# Patient Record
Sex: Male | Born: 1935
Health system: Southern US, Community
[De-identification: ages and names within clinical notes are randomized; demographics above are authoritative.]

## PROBLEM LIST (undated history)

## (undated) DIAGNOSIS — M1990 Unspecified osteoarthritis, unspecified site: Secondary | ICD-10-CM

## (undated) DIAGNOSIS — I2510 Atherosclerotic heart disease of native coronary artery without angina pectoris: Secondary | ICD-10-CM

## (undated) DIAGNOSIS — J45909 Unspecified asthma, uncomplicated: Secondary | ICD-10-CM

## (undated) DIAGNOSIS — C61 Malignant neoplasm of prostate: Secondary | ICD-10-CM

## (undated) DIAGNOSIS — I10 Essential (primary) hypertension: Secondary | ICD-10-CM

## (undated) HISTORY — PX: CORONARY ANGIOPLASTY WITH STENT PLACEMENT: SHX49

## (undated) HISTORY — PX: OTHER SURGICAL HISTORY: SHX169

## (undated) HISTORY — DX: Unspecified osteoarthritis, unspecified site: M19.90

## (undated) HISTORY — DX: Malignant neoplasm of prostate: C61

---

## 2004-11-01 HISTORY — PX: KNEE SURGERY: SHX244

## 2005-03-06 ENCOUNTER — Encounter: Admission: RE | Admit: 2005-03-06 | Discharge: 2005-03-06 | Payer: Self-pay | Admitting: Orthopaedic Surgery

## 2010-10-07 ENCOUNTER — Ambulatory Visit: Payer: Self-pay | Admitting: Internal Medicine

## 2012-11-05 ENCOUNTER — Inpatient Hospital Stay: Payer: Self-pay | Admitting: Student

## 2012-11-05 DIAGNOSIS — I059 Rheumatic mitral valve disease, unspecified: Secondary | ICD-10-CM

## 2012-11-05 LAB — CBC WITH DIFFERENTIAL/PLATELET
Basophil #: 0.2 10*3/uL — ABNORMAL HIGH (ref 0.0–0.1)
Basophil #: 0 10*3/uL (ref 0.0–0.1)
Basophil %: 1.6 %
Basophil %: 1.1 %
Eosinophil #: 0.4 10*3/uL (ref 0.0–0.7)
Eosinophil #: 0 10*3/uL (ref 0.0–0.7)
Eosinophil %: 4.1 %
HCT: 34.8 % — ABNORMAL LOW (ref 40.0–52.0)
HGB: 9.8 g/dL — ABNORMAL LOW (ref 13.0–18.0)
Lymphocyte #: 4.5 10*3/uL — ABNORMAL HIGH (ref 1.0–3.6)
MCHC: 30.3 g/dL — ABNORMAL LOW (ref 32.0–36.0)
MCHC: 31.3 g/dL — ABNORMAL LOW (ref 32.0–36.0)
MCV: 78 fL — ABNORMAL LOW (ref 80–100)
Monocyte #: 0 x10 3/mm — ABNORMAL LOW (ref 0.2–1.0)
Monocyte %: 7.1 %
Monocyte %: 0.7 %
Neutrophil #: 4.4 10*3/uL (ref 1.4–6.5)
Neutrophil #: 4 10*3/uL (ref 1.4–6.5)
Neutrophil %: 43.1 %
Platelet: 296 10*3/uL (ref 150–440)
RDW: 17.3 % — ABNORMAL HIGH (ref 11.5–14.5)
WBC: 10.3 10*3/uL (ref 3.8–10.6)

## 2012-11-05 LAB — COMPREHENSIVE METABOLIC PANEL
BUN: 28 mg/dL — ABNORMAL HIGH (ref 7–18)
Co2: 25 mmol/L (ref 21–32)
Glucose: 150 mg/dL — ABNORMAL HIGH (ref 65–99)
SGPT (ALT): 12 U/L (ref 12–78)
Total Protein: 7.9 g/dL (ref 6.4–8.2)

## 2012-11-05 LAB — BASIC METABOLIC PANEL
EGFR (African American): >60
EGFR (Non-African Amer.): 56 — ABNORMAL LOW
Potassium: 3.4 mmol/L — ABNORMAL LOW (ref 3.5–5.1)

## 2012-11-05 LAB — CK TOTAL AND CKMB (NOT AT ARMC)
CK, Total: 283 U/L — ABNORMAL HIGH (ref 35–232)
CK-MB: 26.3 ng/mL — ABNORMAL HIGH (ref 0.5–3.6)

## 2012-11-05 LAB — PRO B NATRIURETIC PEPTIDE: B-Type Natriuretic Peptide: 3513 pg/mL — ABNORMAL HIGH (ref 0–450)

## 2012-11-05 LAB — TROPONIN I
Troponin-I: 0.09 ng/mL — ABNORMAL HIGH
Troponin-I: 11 ng/mL — ABNORMAL HIGH

## 2012-11-05 LAB — APTT
Activated PTT: 33.7 secs (ref 23.6–35.9)
Activated PTT: 66 secs — ABNORMAL HIGH (ref 23.6–35.9)

## 2012-11-05 LAB — CK-MB: CK-MB: 1.8 ng/mL (ref 0.5–3.6)

## 2012-11-05 LAB — CK: CK, Total: 70 U/L (ref 35–232)

## 2012-11-06 LAB — BASIC METABOLIC PANEL
Anion Gap: 11 (ref 7–16)
BUN: 40 mg/dL — ABNORMAL HIGH (ref 7–18)
Calcium, Total: 8.7 mg/dL (ref 8.5–10.1)
EGFR (Non-African Amer.): 55 — ABNORMAL LOW
Glucose: 97 mg/dL (ref 65–99)

## 2012-11-06 LAB — CBC WITH DIFFERENTIAL/PLATELET
Basophil #: 0 10*3/uL (ref 0.0–0.1)
Eosinophil #: 0 10*3/uL (ref 0.0–0.7)
HGB: 9.6 g/dL — ABNORMAL LOW (ref 13.0–18.0)
Lymphocyte #: 0.9 10*3/uL — ABNORMAL LOW (ref 1.0–3.6)
MCHC: 32.1 g/dL (ref 32.0–36.0)
MCV: 78 fL — ABNORMAL LOW (ref 80–100)
Monocyte #: 0.8 x10 3/mm (ref 0.2–1.0)
Neutrophil %: 81.4 %
Platelet: 278 10*3/uL (ref 150–440)
RBC: 3.83 10*6/uL — ABNORMAL LOW (ref 4.40–5.90)
RDW: 16.6 % — ABNORMAL HIGH (ref 11.5–14.5)

## 2012-11-06 LAB — LIPID PANEL
Ldl Cholesterol, Calc: 134 mg/dL — ABNORMAL HIGH (ref 0–100)
Triglycerides: 62 mg/dL (ref 0–200)
VLDL Cholesterol, Calc: 12 mg/dL (ref 5–40)

## 2012-11-07 LAB — BASIC METABOLIC PANEL
Chloride: 101 mmol/L (ref 98–107)
Co2: 30 mmol/L (ref 21–32)
EGFR (African American): >60
EGFR (Non-African Amer.): >60
Glucose: 82 mg/dL (ref 65–99)
Osmolality: 282 (ref 275–301)
Potassium: 3.4 mmol/L — ABNORMAL LOW (ref 3.5–5.1)

## 2012-11-07 LAB — APTT: Activated PTT: 42.9 secs — ABNORMAL HIGH (ref 23.6–35.9)

## 2012-11-08 LAB — BASIC METABOLIC PANEL
Anion Gap: 8 (ref 7–16)
Calcium, Total: 8.7 mg/dL (ref 8.5–10.1)
Chloride: 104 mmol/L (ref 98–107)
Co2: 27 mmol/L (ref 21–32)
Creatinine: 0.94 mg/dL (ref 0.60–1.30)
EGFR (Non-African Amer.): >60
Sodium: 139 mmol/L (ref 136–145)

## 2012-11-08 LAB — HEMOGLOBIN: HGB: 9.8 g/dL — ABNORMAL LOW (ref 13.0–18.0)

## 2012-11-08 LAB — PLATELET COUNT: Platelet: 286 10*3/uL (ref 150–440)

## 2014-06-24 DIAGNOSIS — I129 Hypertensive chronic kidney disease with stage 1 through stage 4 chronic kidney disease, or unspecified chronic kidney disease: Secondary | ICD-10-CM | POA: Diagnosis present

## 2014-06-24 DIAGNOSIS — I2510 Atherosclerotic heart disease of native coronary artery without angina pectoris: Secondary | ICD-10-CM | POA: Diagnosis present

## 2014-06-24 DIAGNOSIS — J45909 Unspecified asthma, uncomplicated: Secondary | ICD-10-CM | POA: Insufficient documentation

## 2015-02-21 NOTE — Consult Note (Signed)
PATIENT NAME:  Johnathan Gould, Johnathan Gould MR#:  [id number] DATE OF BIRTH:  1935/08/09  DATE OF CONSULTATION:  11/05/2012  REFERRING PHYSICIAN:   CONSULTING PHYSICIAN:  [name], MD  HISTORY OF PRESENT ILLNESS:  The patient was admitted into the hospital room with congestive heart failure, uncontrolled hypertension and flash pulmonary edema. He complained of some shortness of breath on exertion, but denies any history of chest pain. Chest x-ray revealed congestive heart failure.  The patient was hypoxic, responded well to CPAP and inhaled bronchodilator treatment. The patient does not give any previous history of congestive heart failure, but he has a 45-year history of smoking one half to 1 pack per day.  He does not drink any alcohol.  He used to drink when he was young.  He has not been seen in my office in the last three years.  On pH was 7.22, pCO2 of 65.  Blood pressure was 217/127.  ALLERGIES:  Not known.  SOCIAL HISTORY: The patient is a smoker, 1/2 pack per day.   REVIEW OF SYSTEMS:  As per history and physical.  PHYSICAL EXAMINATION: GENERAL: The patient is an alert, cooperative male.   VITAL SIGNS:  Temperature 98.6, pulse rate 100, blood pressure and 136/79, oximetry 96. HEENT: Head normocephalic. Pupils reactive. Sclerae anicteric. Tongue is moist, papillated.  NECK: Supple. The patient has a right carotid bruit, no goiter. No lymphadenopathy.  CARDIOVASCULAR: Apical impulse is not palpable. Both heart sounds are normal. There is a grade II systolic murmur noted.  CHEST: Decreased breath sounds.  ABDOMEN: Soft, nontender, no hepatosplenomegaly.  EXTREMITIES:  There is no pedal edema.   LABORATORY AND DIAGNOSTIC DATA:  CPK 283 with a CK-MB of 26.3. Troponin 11.0. D-dimer 0.98.  Glucose 142, creatinine 1.25, potassium 3.4. Calcium 8.6. GFR more than 60. Repeat blood gases revealed pH 7.35, pCO2 52, pO2  359. Initial blood gas pH was 7.22, pCO2 of 65 and pO2 of 66. Chest x-ray revealed  congestive heart failure.   IMPRESSION: 1.  Acute myocardial infarction, non-ST elevated. 2.  Congestive heart failure with pulmonary edema.  3.  Hypertension with hypertensive cardiovascular disease. 4.  Chronic obstructive pulmonary disease secondary to heavy smoking. 5.  Right carotid bruit.   RECOMMENDATIONS:  I recommended the patient should undergo cardiac catheterization. Will call Dr. Ubaldo Glassing and see if we can put him on the schedule for tomorrow.     ____________________________ [name], MD jm:ct D: 11/05/2012 12:00:50 ET T: 11/05/2012 12:43:53 ET JOB#: [id number]  cc: [name], MD, <Dictator> [name] MD ELECTRONICALLY SIGNED 11/19/2012 11:38

## 2015-02-21 NOTE — H&P (Signed)
PATIENT NAME:  Johnathan Gould, Johnathan Gould MR#:  [id number] DATE OF BIRTH:  [birth year]  DATE OF ADMISSION:  11/05/2012  PRIMARY CARE PHYSICIAN:  Dr. Lavera Guise.  CODE STATUS:  DO NOT RESUSCITATE.   SURROGATE DECISION-MAKER:  His wife, Karthikeya Funke.   CHIEF COMPLAINT:  Shortness of breath.   HISTORY OF PRESENT ILLNESS:  The patient is a 80 year old male with a prior history of hypertension, not on current medications, tobacco abuse without known diagnosis of COPD, history of angina per the wife which is chronic, presents with sudden onset shortness of breath.  The patient was in his usual state of health when he suddenly became short of breath.  EMS was alerted and he was noted to be hypoxic 80% on room air.  Nonrebreather and DuoNebs were attempted via EMS, however he was anxious so started on CPAP by EMS.  The patient has not been evaluated by a doctor in the last 3 to 4 years.  Most of the history is obtained from the wife who is at the bedside.  The patient corroborates, however he is quite stoic.  During this event, the patient was sweating.  He has had chest pain, apparently long-standing at rest and with activity.  Wife tells me that patient has dyspnea on exertion at least for the last 1 year, however when I ask the patient, he says he does not make a habit of walking.  Wife noticed that he has had some bilateral swelling in his legs just extending superior to his ankles.  He denies orthopnea, although he does sleep sitting up, but that is chronic for him.  During the acute episode his heart was racing and he was quite anxious.   Upon evaluation in the Emergency Department, patient was in significant respiratory distress, was wheezing diffusely, retracting, using accessory muscles and he had prolonged expirations and he was tachycardic.  He was started on BiPAP.  Initial ABG showed hypercarbic respiratory failure with a pH of 7.22 and a pCO2 of 65.  Also, upon arrival his blood pressure was noted to be  significantly elevated at 217/127.  He has since received DuoNebs x3, albuterol, Solu-Medrol, magnesium sulfate. Initial chest x-ray also showed pulmonary edema so he has been started on a nitroglycerin paste and he has received 40 mg of Lasix.  The patient has had a productive cough, however, he is unable to tell me the duration of this cough.    PAST MEDICAL HISTORY:   1.  Prior history of hypertension, however he has been off meds for many years.   2.  Apparently history of angina.  Wife tells me that he had a negative stress test 3 years ago, has not followed up with a physician since then.  3.  He smokes tobacco.   PAST SURGICAL HISTORY:  None.   MEDICATIONS:  None.  ALLERGIES:  None.   SOCIAL HISTORY:  Smokes, although he is unable to quantify how much.  Denies alcohol or illicit drugs.  He is married.   FAMILY HISTORY:  Noncontributory.   REVIEW OF SYSTEMS:   CONSTITUTIONAL:  Denies fevers, fatigue.  Admits to intentional 40 pound weight loss over the last one year.  He changed his diet.  EYES:  No eye pain.  No double vision.    EARS, NOSE, THROAT:  Denies tinnitus, ear pain, epistaxis.  Patient is very hard of hearing. RESPIRATORY:  Admits to productive cough, shortness of breath, denies painful respirations.  CARDIOVASCULAR:  Admits to chronic chest pain,  some ankle edema.  Denies orthopnea.  Denies palpitations.  GASTROINTESTINAL:  Denies nausea, vomiting, diarrhea, abdominal pain, hematochezia or melena.  GENITOURINARY:  Denies frequency, urgency or dysuria.  ENDOCRINE:  Denies increased thirst or night sweats.  INTEGUMENTARY:  Denies any new skin rashes.  MUSCULOSKELETAL:  Denies arthritis, joint aches or joint swelling.  NEUROLOGIC:  Denies numbness, dysarthria, tremor, focal weakness.  PSYCHIATRIC:  Family reports insomnia, that is chronic for him.  No history of depression or anxiety.   PHYSICAL EXAMINATION:  VITAL SIGNS:  On admission, pulse 133, respirations 46,  blood pressure 217/127.  Currently his vital signs are temp 97.8, heart rate 99, respirations 20 with a blood pressure 137/76, saturating 94% on 2 liters.  GENERAL:  Caucasian male in no apparent distress currently.  His symptoms have improved after ED initial therapy.  EYES:  Pupils are equally round and reactive to light and accommodation.  Funduscopic exam attempted, but unsuccessful.  Anicteric sclerae.  EARS, NOSE, THROAT:  Normal external ears and nares.  Unable to appreciate posterior oropharynx.  CARDIOVASCULAR:  S1, S2, regular rate and rhythm.  No pretibial edema.  PULMONARY:  Clear to auscultation bilaterally at this point.  Normal effort now.  ABDOMEN:  Soft, nontender, nondistended.  No hepatomegaly.  SKIN:  Warm and dry.  No rashes.  MUSCULOSKELETAL:  Full range of motion of bilateral upper and lower extremities.  No clubbing or cyanosis noted.  PSYCHIATRIC:  The patient is awake, alert and oriented.  Judgment appears intact.   LABORATORY DATA:  White count 10.3, hemoglobin 10.5, hematocrit 34.8, platelets 371 with an MCV of 80.   Glucose 150, BUN 28, creatinine 1.18, sodium 143, potassium 3.7, chloride 109, bicarb 25, calcium 8.5, bilirubin 0.2, alk phos is 126, ALT 12, AST 20, total protein 7.9, albumin 3.6, osmolality 293, anion gap of 9.   BNP 3513.  Troponin 0.09.  CK 70.  CK-MB 1.8.  D-dimer is elevated to 0.98.  PH 7.22, pCO2 65, pO2 66, FiO2 100, bicarb 26.6 on BiPAP.  Followup ABG an hour later shows pH 7.35, pCO2 52, pO2 359, FiO2 100%, bicarb 28.7 on BiPAP.    Chest x-ray shows pulmonary edema.  Final x-ray pending.  Initial EKG shows sinus tachycardia at 121 beats per minute with possible lateral infarct.  Repeat EKG 3 hours later shows normal sinus rhythm at 98 beats per minute with incomplete right bundle branch block.  The patient also has voltage criteria for LVH.  There is probable P pulmonale.  There is very mild ST segment depression, between 0.5 to 1 mm in V4, V5  and V6.  No T wave inversions.   ASSESSMENT AND PLAN:  The patient is a 80 year old male presenting with acute respiratory failure, found to be hypercarbic, with pulmonary edema and significantly hypertensive. 1.  Acute respiratory failure, hypercarbic and hypoxic.  Etiology of this is multifactorial at this time, most likely due to underlying chronic obstructive pulmonary disease exacerbation as well as acute pulmonary edema.  He has improved on BiPAP with improvement of his pH to 7.35 and a pCO2 of 52.  We will continue BiPAP.  We will continue Nitro-Bid to improve the pulmonary edema and continue nebs.  We will wean him to nasal cannula and off oxygen as tolerated. 2.  Acute pulmonary edema, etiology unclear, malignant hypertension versus new onset congestive heart failure versus non-ST-elevation myocardial infarction.  He has received Lasix in the Emergency Department.  We will continue Lasix 40 mg q.  12 for the next 4 doses and then further diuresis will be dependent on his exam.  Given the history of "long-standing angina and dyspnea on exertion" I am concerned for ischemic causes and thus we will check an echocardiogram.  We will cycle his cardiac enzymes and we will consult cardiology regarding need for inpatient stress test.  He apparently had a reportedly negative stress test 3 years ago.  I do not know the physician that he saw.  A D-dimer is slightly elevated and, given his presentation, I will rule out an acute pulmonary embolus with a CTPA.  3.  Chronic obstructive pulmonary disease exacerbation.  Given his long-standing tobacco use, he probably has underlying chronic obstructive pulmonary disease and the acute hypercarbic respiratory failure is most likely a result of chronic obstructive pulmonary disease exacerbation.  He is improved with DuoNeb, Solu-Medrol as well as BiPAP.  We will continue DuoNebs every 4 hours, BiPAP, Solu-Medrol 40 mg daily.  He will benefit from outpatient pulmonary  function testing and, if noted to be diagnosed with chronic obstructive pulmonary disease, started on appropriate medications.  4.  Malignant hypertension.  Currently blood pressure is well-controlled.  The patient had a diagnosis of hypertension, however has stopped medications.  At this point, we will start hydrochlorothiazide and lisinopril orally and continue Lasix for diuresis for the pulmonary edema as well as Nitro-Bid for the pulmonary edema.  5.  Tobacco abuse.  Cessation counseling was provided.  The patient is not ready to quit.  6.  Prophylaxis.  Lovenox.  7.  CODE STATUS:  He is a DO NOT RESUSCITATE.  This was discussed with himself and his wife as well as his sons, who are all aware of his wishes.  His surrogate decision-maker is his wife, Stelios Kirby.   DISPOSITION:  The patient is being admitted inpatient for management of acute respiratory failure as well as malignant hypertension.  I anticipate he will require 2 hospital midnight stays for evaluation and management.   TIME SPENT COORDINATING ADMISSION:  Fifty minutes.     ____________________________ Samson Frederic, DO aeo:ea D: 11/05/2012 03:13:31 ET T: 11/05/2012 06:26:07 ET JOB#: [id number]  cc: Samson Frederic, DO, <Dictator> Samson Frederic, DO Dr. Haydee Salter E Favour Aleshire DO ELECTRONICALLY SIGNED 12/26/2012 3:43

## 2015-02-21 NOTE — Discharge Summary (Signed)
PATIENT NAME:  Johnathan Gould, Johnathan Gould MR#:  [id number] DATE OF BIRTH:  01-Dec-1934  DATE OF ADMISSION:  11/05/2012 DATE OF DISCHARGE:  11/08/2012  CONSULTANTS:  [name], MD, cardiology, and Javier Docker. Ubaldo Glassing, MD, from cardiology.   CHIEF COMPLAINT:  Shortness of breath.   DISCHARGE DIAGNOSES:   1.  Acute respiratory failure from chronic obstructive pulmonary disease flare likely and acute systolic congestive heart failure.  2.  Non-ST elevation myocardial infarction.  3.  Malignant hypertension.  4.  Flash pulmonary edema.  5.  History of hypertension, not on any medications.  6.  History of angina.  7.  Tobacco abuse.   DISCHARGE MEDICATIONS:   1.  Prednisone taper 40 mg for 1 day, then taper by 10 mg daily until done in 4 days.  2.  Lisinopril 5 mg daily.  3.  Nitroglycerin 0.4 mg sublingual 1 tab every 5 minutes p.r.n. x 3 for chest pain or angina.  4.  Rosuvastatin 10 mg bedtime.  5.  Plavix 75 mg daily.  6.  Metoprolol tartrate 25 mg 2 times a day.  7.  Albuterol/ipratropium 2 puffs 4 times a day for shortness of breath as needed.  8.  Hydrochlorothiazide 25 mg daily.  9.  Tiotropium 18 mcg inhaled 1 cap once a day.   DISPOSITION:  Home.   DIET:  Low sodium, low fat, low cholesterol.   ACTIVITY: As tolerated.   FOLLOWUP:  Please follow with your primary care physician within 1 to 2 weeks. Please follow with Dr. Ubaldo Glassing within a week. Please follow with a pulmonologist for scheduling for pulmonary function tests within 1 to 2 weeks.   HISTORY OF PRESENT ILLNESS:  For full details of the H and P, please see the dictation by Dr. Levonne Hubert on January 5th, but briefly, this is a pleasant 80 year old male on no medications with tobacco abuse ongoing without significant history of COPD, but has angina who presented with shortness of breath. He was noted to be hypoxic at 80% on room air on arrival. He was given DuoNebs and started on oxygen and CPAP and admitted to the hospitalist service.  ABGs did show hypercapnic respiratory failure with pH of 7.22 and pCO2 of 65. He also did receive a dose of Lasix.   SIGNIFICANT LABS AND IMAGING:  Initial BNP was 3513. BUN was 28, creatinine 1.18, chloride 108, potassium 3.7. LFTs were within normal limits. Initial troponin was 0.09, then 11 and then 7.48. Initial CK-MB was 1.8, then 26.3, then 12.7. The last CK-MB was 1.5 as of yesterday. Initial WBC 10.3, hemoglobin 10.5. D-dimer was 0.98. Cardiac catheterization on January 6th showed 70% stenosis in proximal LAD, second diagonal, 100% stenosis proximal circumflex, small size 100% stenosis, mid RCA 99% stenosis and in a second lesion there was a 70% stenosis. EF was 40%. A repeat cardiac catheterization on December 7th during which time the patient received a drug-eluting stent to the RCA lesion. The result of this is not _____. Echocardiogram showing EF of 35 to 40%, moderate concentric left ventricular hypertrophy. There is moderate lateral wall hypokinesis, severe inferior wall hypokinesis and moderate mitral regurgitation. Initial x-ray of the chest showing interstitial infiltrate likely representing pulmonary edema.   HOSPITAL COURSE:  The patient was admitted to the hospitalist service for presumed respiratory failure, likely COPD, with flash edema. The patient was started on steroids and nebulizers. The patient also did have a bump in his troponin, likely a non-ST elevation MI. The troponin did trend to  11 and therefore the patient was started on a heparin drip, beta-blocker and cardiology was consulted. The patient did present with malignant hypertension on arrival with significant elevation of blood pressure as per H and P.   In regards to the acute respiratory failure, he improved dramatically. He does have tobacco abuse; however, he is not on any medications including inhalers. He was started on steroids, nebulizers, oxygen and slowly weaned off. Currently, he is on room air and ambulating  without significant shortness of breath. Furthermore, the patient did have flash pulmonary edema which did resolve with Lasix. This was likely in the setting of acute systolic CHF as the echocardiogram did show EF down at about 35 to 40%. Furthermore, pulmonary edema was worsened by malignant hypertension which likely led to flash edema in addition to acute CHF. He has clinically improved and currently, there is no significant volume overload. He was started on several medications including beta-blocker, ACE inhibitor and aspirin.   The patient did bump his troponin to 11 with elevation of CK-MB fraction as well and therefore started on heparin drip and cardiology consult. The patient did have multivessel disease and per cardiology, he did not want CABG. Therefore, he underwent a repeat cardiac catheterization and a drug-eluting stent was placed on the RCA lesion which was thought to be the _____ lesion at this point. He will follow with cardiology post discharge for further evaluation and management and consideration for possible CABG. He will be discharged on statin and Plavix in addition to the above-said medications. His blood pressure has been controlled and at this point after a day in the CCU post stent placement, he is chest pain free, ambulating and per cardiology is okay to be discharged and therefore, he will be discharged with outpatient followup.   TOTAL TIME SPENT:  35 minutes.   CODE STATUS:  The patient is DO NOT RESUSCITATE.    ____________________________ Vivien Presto, MD sa:si D: 11/08/2012 16:29:16 ET T: 11/08/2012 20:29:18 ET JOB#: [id number]  cc: Vivien Presto, MD, <Dictator> Javier Docker. Ubaldo Glassing, MD Vivien Presto MD ELECTRONICALLY SIGNED 11/18/2012 17:24

## 2016-10-16 ENCOUNTER — Inpatient Hospital Stay
Admission: EM | Admit: 2016-10-16 | Discharge: 2016-10-23 | DRG: 686 | Disposition: A | Discharge status: Home / Self Care | Payer: Medicare Other | Attending: Internal Medicine | Admitting: Internal Medicine

## 2016-10-16 ENCOUNTER — Encounter: Payer: Self-pay | Admitting: Emergency Medicine

## 2016-10-16 ENCOUNTER — Emergency Department: Payer: Medicare Other

## 2016-10-16 DIAGNOSIS — C61 Malignant neoplasm of prostate: Secondary | ICD-10-CM | POA: Diagnosis present

## 2016-10-16 DIAGNOSIS — N138 Other obstructive and reflux uropathy: Secondary | ICD-10-CM | POA: Diagnosis present

## 2016-10-16 DIAGNOSIS — M6281 Muscle weakness (generalized): Secondary | ICD-10-CM

## 2016-10-16 DIAGNOSIS — I10 Essential (primary) hypertension: Secondary | ICD-10-CM | POA: Diagnosis present

## 2016-10-16 DIAGNOSIS — I129 Hypertensive chronic kidney disease with stage 1 through stage 4 chronic kidney disease, or unspecified chronic kidney disease: Secondary | ICD-10-CM | POA: Diagnosis present

## 2016-10-16 DIAGNOSIS — E875 Hyperkalemia: Secondary | ICD-10-CM | POA: Diagnosis present

## 2016-10-16 DIAGNOSIS — Z66 Do not resuscitate: Secondary | ICD-10-CM | POA: Diagnosis present

## 2016-10-16 DIAGNOSIS — C7911 Secondary malignant neoplasm of bladder: Secondary | ICD-10-CM | POA: Diagnosis present

## 2016-10-16 DIAGNOSIS — Z8249 Family history of ischemic heart disease and other diseases of the circulatory system: Secondary | ICD-10-CM

## 2016-10-16 DIAGNOSIS — C7951 Secondary malignant neoplasm of bone: Secondary | ICD-10-CM | POA: Diagnosis present

## 2016-10-16 DIAGNOSIS — E872 Acidosis: Secondary | ICD-10-CM | POA: Diagnosis present

## 2016-10-16 DIAGNOSIS — M25552 Pain in left hip: Secondary | ICD-10-CM

## 2016-10-16 DIAGNOSIS — I2510 Atherosclerotic heart disease of native coronary artery without angina pectoris: Secondary | ICD-10-CM | POA: Diagnosis present

## 2016-10-16 DIAGNOSIS — T50995A Adverse effect of other drugs, medicaments and biological substances, initial encounter: Secondary | ICD-10-CM | POA: Diagnosis not present

## 2016-10-16 DIAGNOSIS — I252 Old myocardial infarction: Secondary | ICD-10-CM

## 2016-10-16 DIAGNOSIS — D638 Anemia in other chronic diseases classified elsewhere: Secondary | ICD-10-CM | POA: Diagnosis present

## 2016-10-16 DIAGNOSIS — M549 Dorsalgia, unspecified: Secondary | ICD-10-CM | POA: Diagnosis present

## 2016-10-16 DIAGNOSIS — R2681 Unsteadiness on feet: Secondary | ICD-10-CM

## 2016-10-16 DIAGNOSIS — N1330 Unspecified hydronephrosis: Secondary | ICD-10-CM

## 2016-10-16 DIAGNOSIS — N179 Acute kidney failure, unspecified: Secondary | ICD-10-CM | POA: Diagnosis present

## 2016-10-16 DIAGNOSIS — A419 Sepsis, unspecified organism: Secondary | ICD-10-CM | POA: Diagnosis present

## 2016-10-16 DIAGNOSIS — N131 Hydronephrosis with ureteral stricture, not elsewhere classified: Secondary | ICD-10-CM | POA: Diagnosis present

## 2016-10-16 DIAGNOSIS — N400 Enlarged prostate without lower urinary tract symptoms: Secondary | ICD-10-CM | POA: Diagnosis present

## 2016-10-16 DIAGNOSIS — J45909 Unspecified asthma, uncomplicated: Secondary | ICD-10-CM | POA: Diagnosis present

## 2016-10-16 DIAGNOSIS — N139 Obstructive and reflux uropathy, unspecified: Secondary | ICD-10-CM | POA: Diagnosis present

## 2016-10-16 DIAGNOSIS — D494 Neoplasm of unspecified behavior of bladder: Secondary | ICD-10-CM | POA: Diagnosis not present

## 2016-10-16 DIAGNOSIS — R590 Localized enlarged lymph nodes: Secondary | ICD-10-CM | POA: Diagnosis present

## 2016-10-16 DIAGNOSIS — Z79899 Other long term (current) drug therapy: Secondary | ICD-10-CM | POA: Diagnosis not present

## 2016-10-16 DIAGNOSIS — Z7982 Long term (current) use of aspirin: Secondary | ICD-10-CM

## 2016-10-16 DIAGNOSIS — K5903 Drug induced constipation: Secondary | ICD-10-CM | POA: Diagnosis not present

## 2016-10-16 DIAGNOSIS — R310 Gross hematuria: Secondary | ICD-10-CM | POA: Diagnosis present

## 2016-10-16 DIAGNOSIS — Z955 Presence of coronary angioplasty implant and graft: Secondary | ICD-10-CM | POA: Diagnosis not present

## 2016-10-16 DIAGNOSIS — N183 Chronic kidney disease, stage 3 unspecified: Secondary | ICD-10-CM | POA: Diagnosis present

## 2016-10-16 DIAGNOSIS — R262 Difficulty in walking, not elsewhere classified: Secondary | ICD-10-CM

## 2016-10-16 DIAGNOSIS — N132 Hydronephrosis with renal and ureteral calculous obstruction: Secondary | ICD-10-CM | POA: Diagnosis not present

## 2016-10-16 DIAGNOSIS — N3289 Other specified disorders of bladder: Secondary | ICD-10-CM | POA: Diagnosis not present

## 2016-10-16 HISTORY — DX: Unspecified asthma, uncomplicated: J45.909

## 2016-10-16 HISTORY — DX: Essential (primary) hypertension: I10

## 2016-10-16 HISTORY — DX: Atherosclerotic heart disease of native coronary artery without angina pectoris: I25.10

## 2016-10-16 LAB — COMPREHENSIVE METABOLIC PANEL
ALK PHOS: 240 U/L — AB (ref 38–126)
ALT: 8 U/L — ABNORMAL LOW (ref 17–63)
ANION GAP: 12 (ref 5–15)
AST: 11 U/L — ABNORMAL LOW (ref 15–41)
Albumin: 3.5 g/dL (ref 3.5–5.0)
BILIRUBIN TOTAL: 0.3 mg/dL (ref 0.3–1.2)
BUN: 77 mg/dL — ABNORMAL HIGH (ref 6–20)
CALCIUM: 7.7 mg/dL — AB (ref 8.9–10.3)
CO2: 16 mmol/L — ABNORMAL LOW (ref 22–32)
Chloride: 108 mmol/L (ref 101–111)
Creatinine, Ser: 6.69 mg/dL — ABNORMAL HIGH (ref 0.61–1.24)
GFR, EST AFRICAN AMERICAN: 8 mL/min — AB (ref >60)
GFR, EST NON AFRICAN AMERICAN: 7 mL/min — AB (ref >60)
GLUCOSE: 96 mg/dL (ref 65–99)
Potassium: 5.6 mmol/L — ABNORMAL HIGH (ref 3.5–5.1)
Sodium: 136 mmol/L (ref 135–145)
TOTAL PROTEIN: 6.9 g/dL (ref 6.5–8.1)

## 2016-10-16 LAB — CBC WITH DIFFERENTIAL/PLATELET
Basophils Absolute: 0.1 10*3/uL (ref 0–0.1)
Basophils Relative: 1 %
Eosinophils Absolute: 0.1 10*3/uL (ref 0–0.7)
Eosinophils Relative: 2 %
HEMATOCRIT: 23.6 % — AB (ref 40.0–52.0)
HEMOGLOBIN: 7.9 g/dL — AB (ref 13.0–18.0)
LYMPHS ABS: 1.2 10*3/uL (ref 1.0–3.6)
Lymphocytes Relative: 25 %
MCH: 29.9 pg (ref 26.0–34.0)
MCHC: 33.5 g/dL (ref 32.0–36.0)
MCV: 89.3 fL (ref 80.0–100.0)
MONO ABS: 0.5 10*3/uL (ref 0.2–1.0)
MONOS PCT: 11 %
NEUTROS ABS: 3 10*3/uL (ref 1.4–6.5)
NEUTROS PCT: 61 %
Platelets: 239 10*3/uL (ref 150–440)
RBC: 2.64 MIL/uL — ABNORMAL LOW (ref 4.40–5.90)
RDW: 14.1 % (ref 11.5–14.5)
WBC: 4.9 10*3/uL (ref 3.8–10.6)

## 2016-10-16 LAB — TROPONIN I

## 2016-10-16 LAB — URINALYSIS, COMPLETE (UACMP) WITH MICROSCOPIC
BILIRUBIN URINE: NEGATIVE
GLUCOSE, UA: NEGATIVE mg/dL
Ketones, ur: NEGATIVE mg/dL
Leukocytes, UA: NEGATIVE
NITRITE: NEGATIVE
PH: 5 (ref 5.0–8.0)
Protein, ur: 100 mg/dL — AB
SPECIFIC GRAVITY, URINE: 1.009 (ref 1.005–1.030)

## 2016-10-16 LAB — APTT: aPTT: 33 seconds (ref 24–36)

## 2016-10-16 LAB — PROTIME-INR
INR: 1.01
Prothrombin Time: 13.3 seconds (ref 11.4–15.2)

## 2016-10-16 LAB — LIPASE, BLOOD: LIPASE: 29 U/L (ref 11–51)

## 2016-10-16 MED ORDER — HEPARIN SODIUM (PORCINE) 5000 UNIT/ML IJ SOLN
5000.0000 [IU] | Freq: Three times a day (TID) | INTRAMUSCULAR | Status: DC
  Administered 2016-10-17 (×2): 5000 [IU] via SUBCUTANEOUS
  Filled 2016-10-16 (×2): qty 1

## 2016-10-16 MED ORDER — ONDANSETRON HCL 4 MG/2ML IJ SOLN
4.0000 mg | Freq: Four times a day (QID) | INTRAMUSCULAR | Status: DC | PRN
  Administered 2016-10-16 – 2016-10-22 (×10): 4 mg via INTRAVENOUS
  Filled 2016-10-16 (×10): qty 2

## 2016-10-16 MED ORDER — FENTANYL CITRATE (PF) 100 MCG/2ML IJ SOLN
INTRAMUSCULAR | Status: AC
  Administered 2016-10-16: 50 ug via INTRAVENOUS
  Filled 2016-10-16: qty 2

## 2016-10-16 MED ORDER — ASPIRIN EC 81 MG PO TBEC: 81.0000 mg | DELAYED_RELEASE_TABLET | Freq: Every day | ORAL | Status: DC

## 2016-10-16 MED ORDER — FENTANYL CITRATE (PF) 100 MCG/2ML IJ SOLN
50.0000 ug | Freq: Once | INTRAMUSCULAR | Status: AC
  Administered 2016-10-16: 50 ug via INTRAVENOUS
  Filled 2016-10-16: qty 2

## 2016-10-16 MED ORDER — MORPHINE SULFATE (PF) 4 MG/ML IV SOLN
4.0000 mg | Freq: Once | INTRAVENOUS | Status: AC
  Administered 2016-10-16: 4 mg via INTRAVENOUS
  Filled 2016-10-16: qty 1

## 2016-10-16 MED ORDER — ACETAMINOPHEN 650 MG RE SUPP: 650.0000 mg | Freq: Four times a day (QID) | RECTAL | Status: DC | PRN

## 2016-10-16 MED ORDER — METOPROLOL TARTRATE 50 MG PO TABS
50.0000 mg | ORAL_TABLET | Freq: Two times a day (BID) | ORAL | Status: DC
  Administered 2016-10-18 – 2016-10-23 (×11): 50 mg via ORAL
  Filled 2016-10-16 (×12): qty 1

## 2016-10-16 MED ORDER — MORPHINE SULFATE (PF) 4 MG/ML IV SOLN
4.0000 mg | INTRAVENOUS | Status: DC | PRN
  Administered 2016-10-17: 4 mg via INTRAVENOUS
  Filled 2016-10-16: qty 1

## 2016-10-16 MED ORDER — LISINOPRIL 5 MG PO TABS: 5.0000 mg | ORAL_TABLET | Freq: Every day | ORAL | Status: DC

## 2016-10-16 MED ORDER — OXYCODONE-ACETAMINOPHEN 5-325 MG PO TABS
1.0000 | ORAL_TABLET | ORAL | Status: DC | PRN
  Administered 2016-10-16: 1 via ORAL
  Filled 2016-10-16: qty 1

## 2016-10-16 MED ORDER — SODIUM BICARBONATE 8.4 % IV SOLN
INTRAVENOUS | Status: DC
  Administered 2016-10-16 – 2016-10-17 (×2): via INTRAVENOUS
  Filled 2016-10-16 (×3): qty 150

## 2016-10-16 MED ORDER — SODIUM CHLORIDE 0.9 % IV BOLUS (SEPSIS)
1000.0000 mL | Freq: Once | INTRAVENOUS | Status: AC
  Administered 2016-10-16: 1000 mL via INTRAVENOUS

## 2016-10-16 MED ORDER — FENTANYL CITRATE (PF) 100 MCG/2ML IJ SOLN
50.0000 ug | Freq: Once | INTRAMUSCULAR | Status: AC
Start: 2016-10-16 — End: 2016-10-16
  Administered 2016-10-16: 50 ug via INTRAVENOUS

## 2016-10-16 MED ORDER — HYDROMORPHONE HCL 1 MG/ML IJ SOLN
INTRAMUSCULAR | Status: AC
  Administered 2016-10-16: 0.5 mg via INTRAVENOUS
  Filled 2016-10-16: qty 1

## 2016-10-16 MED ORDER — ONDANSETRON HCL 4 MG PO TABS
4.0000 mg | ORAL_TABLET | Freq: Four times a day (QID) | ORAL | Status: DC | PRN
  Administered 2016-10-23: 15:00:00 4 mg via ORAL
  Filled 2016-10-16: qty 1

## 2016-10-16 MED ORDER — ACETAMINOPHEN 325 MG PO TABS
650.0000 mg | ORAL_TABLET | Freq: Four times a day (QID) | ORAL | Status: DC | PRN
  Administered 2016-10-18: 650 mg via ORAL
  Filled 2016-10-16: qty 2

## 2016-10-16 MED ORDER — HYDROMORPHONE HCL 1 MG/ML IJ SOLN
0.5000 mg | INTRAMUSCULAR | Status: AC
  Administered 2016-10-16: 0.5 mg via INTRAVENOUS

## 2016-10-16 MED ORDER — ONDANSETRON HCL 4 MG/2ML IJ SOLN
INTRAMUSCULAR | Status: AC
  Administered 2016-10-16: 4 mg via INTRAVENOUS
  Filled 2016-10-16: qty 2

## 2016-10-16 MED ORDER — FENTANYL CITRATE (PF) 100 MCG/2ML IJ SOLN
25.0000 ug | Freq: Once | INTRAMUSCULAR | Status: AC
  Administered 2016-10-16: 25 ug via INTRAVENOUS
  Filled 2016-10-16: qty 2

## 2016-10-16 NOTE — ED Notes (Signed)
Bladder Scan: 276

## 2016-10-16 NOTE — Consult Note (Addendum)
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 The mass is likely bladder cancer, though prostate cancer is also a possibility. The patient's renal failure has likely been slowly developing over a long period of time as a result of obstruction from this mass . On review of imaging, it is highly unlikely that retrograde ureteral stent placement will be possible due to tumor infiltration of the trigone. Patient will need urinary diversion via nephrostomy tubes. This was discussed with interventional radiology who will plan nephrostomy placement tomorrow. Patient will need coags and to be NPO after midnight for this procedure. Urology will see patient in consultation in the morning to discuss further work up of the bladder mass as well as his metastatic disease (likely bladder in origin).

## 2016-10-16 NOTE — H&P (Signed)
[facility] NAME: Johnathan Gould    MR#:  [name]  DATE OF BIRTH:  [birth year]  DATE OF ADMISSION:  10/16/2016  PRIMARY CARE PHYSICIAN: No PCP Per Patient   REQUESTING/REFERRING PHYSICIAN: Jacqualine Code, MD  CHIEF COMPLAINT:   Chief Complaint  Patient presents with  . Back Pain    HISTORY OF PRESENT ILLNESS:  Johnathan Gould  is a 81 y.o. male who presents with Good onset low back pain. Here in the ED the patient was found to have significant acute kidney injury, and on CT imaging to have an obstructing bladder mass. Urology was contacted and recommended IR placement of nephrostomy tubes in the morning, they will consult on the patient. Hospitalists were called for admission.  PAST MEDICAL HISTORY:   Past Medical History:  Diagnosis Date  . CAD (coronary artery disease)   . Hypertension   . RAD (reactive airway disease)     PAST SURGICAL HISTORY:   Past Surgical History:  Procedure Laterality Date  . CORONARY ANGIOPLASTY WITH STENT PLACEMENT      SOCIAL HISTORY:   Social History  Substance Use Topics  . Smoking status: Never Smoker  . Smokeless tobacco: Never Used  . Alcohol use No    FAMILY HISTORY:   Family History  Problem Relation Age of Onset  . Hypertension Other     DRUG ALLERGIES:  No Known Allergies  MEDICATIONS AT HOME:   Prior to Admission medications   Medication Sig Start Date End Date Taking? Authorizing Provider  nitroGLYCERIN (NITROSTAT) 0.4 MG SL tablet Place 0.4 mg under the tongue every 5 (five) minutes x 3 doses as needed for chest pain. 06/23/16  Yes Historical Provider, MD  aspirin EC 81 MG tablet Take 81 mg by mouth daily.    Historical Provider, MD  lisinopril (PRINIVIL,ZESTRIL) 5 MG tablet Take 5 mg by mouth daily. 08/03/16   Historical Provider, MD  metoprolol (LOPRESSOR) 50 MG tablet Take 50 mg by mouth 2 (two) times daily. 08/03/16   Historical Provider, MD    REVIEW OF  SYSTEMS:  Review of Systems  Constitutional: Negative for chills, fever, malaise/fatigue and weight loss.  HENT: Negative for ear pain, hearing loss and tinnitus.   Eyes: Negative for blurred vision, double vision, pain and redness.  Respiratory: Negative for cough, hemoptysis and shortness of breath.   Cardiovascular: Negative for chest pain, palpitations, orthopnea and leg swelling.  Gastrointestinal: Positive for abdominal pain. Negative for constipation, diarrhea, nausea and vomiting.  Genitourinary: Negative for dysuria, frequency and hematuria.  Musculoskeletal: Positive for back pain. Negative for joint pain and neck pain.  Skin:       No acne, rash, or lesions  Neurological: Negative for dizziness, tremors, focal weakness and weakness.  Endo/Heme/Allergies: Negative for polydipsia. Does not bruise/bleed easily.  Psychiatric/Behavioral: Negative for depression. The patient is not nervous/anxious and does not have insomnia.      VITAL SIGNS:   Vitals:   10/16/16 1658 10/16/16 1818 10/16/16 1930  BP: (!) 134/94 122/78 119/77  Pulse: 72 74 66  Resp: 18 18 20   Temp: 98 F (36.7 C)    TempSrc: Oral    SpO2: 100% 100% 98%  Weight: 72.6 kg (160 lb)    Height: 5\' 10"  (1.778 m)     Wt Readings from Last 3 Encounters:  10/16/16 72.6 kg (160 lb)    PHYSICAL EXAMINATION:  Physical Exam  Vitals reviewed. Constitutional: He is oriented to  person, place, and time. He appears well-developed and well-nourished. No distress.  HENT:  Head: Normocephalic and atraumatic.  Mouth/Throat: Oropharynx is clear and moist.  Eyes: Conjunctivae and EOM are normal. Pupils are equal, round, and reactive to light. No scleral icterus.  Neck: Normal range of motion. Neck supple. No JVD present. No thyromegaly present.  Cardiovascular: Normal rate, regular rhythm and intact distal pulses.  Exam reveals no gallop and no friction rub.   No murmur heard. Respiratory: Effort normal and breath sounds  normal. No respiratory distress. He has no wheezes. He has no rales.  GI: Soft. Bowel sounds are normal. He exhibits no distension. There is tenderness.  Musculoskeletal: Normal range of motion. He exhibits no edema.  No arthritis, no gout  Lymphadenopathy:    He has no cervical adenopathy.  Neurological: He is alert and oriented to person, place, and time. No cranial nerve deficit.  No dysarthria, no aphasia  Skin: Skin is warm and dry. No rash noted. No erythema.  Psychiatric: He has a normal mood and affect. His behavior is normal. Judgment and thought content normal.    LABORATORY PANEL:   CBC  Recent Labs Lab 10/16/16 1853  WBC 4.9  HGB 7.9*  HCT 23.6*  PLT 239   ------------------------------------------------------------------------------------------------------------------  Chemistries   Recent Labs Lab 10/16/16 1853  NA 136  K 5.6*  CL 108  CO2 16*  GLUCOSE 96  BUN 77*  CREATININE 6.69*  CALCIUM 7.7*  AST 11*  ALT 8*  ALKPHOS 240*  BILITOT 0.3   ------------------------------------------------------------------------------------------------------------------  Cardiac Enzymes  Recent Labs Lab 10/16/16 1853  TROPONINI <0.03   ------------------------------------------------------------------------------------------------------------------  RADIOLOGY:  Ct Abdomen Pelvis Wo Contrast  Result Date: 10/16/2016 CLINICAL DATA:  Initial evaluation for acute bilateral flank and lower back pain extending into groin. EXAM: CT ABDOMEN AND PELVIS WITHOUT CONTRAST TECHNIQUE: Multidetector CT imaging of the abdomen and pelvis was performed following the standard protocol without IV contrast. COMPARISON:  None available. FINDINGS: Lower chest: Mild subsegmental atelectasis seen dependently within the visualized lung bases. Visualized lungs are otherwise clear. Fat containing Bochdalek's type hernia present at the left lung base. Cardiomegaly with prominent coronary  artery calcifications partially visualized. Hepatobiliary: Limited noncontrast evaluation of the liver is unremarkable. Gallbladder within normal limits. No biliary dilatation. Pancreas: Pancreas within normal limits. Spleen: Spleen within normal limits. Adrenals/Urinary Tract: Adrenal glands are normal. Bilateral hydroureteronephrosis is seen, severe on the right, more moderate on the left. No radiopaque calculi seen within either kidney or along the course of either dilated ureter. There is irregular polypoid soft tissue filling the posterior bladder lumen, measuring approximately 4.9 x 2.6 cm (series 2, image 75). Exact measurements difficult given the lack of IV contrast. Finding highly concerning for bladder carcinoma. Prostate is enlarged and intimately apposed to the mass within the bladder lumen. Difficult to discern prostate versus mass on this noncontrast examination. Incidental note made of a 1 cm hyperdense left renal cyst (series 2, image 30), indeterminate, but possibly a complex and/ or hemorrhagic cyst. Stomach/Bowel: Stomach within normal limits. No evidence for bowel obstruction. Appendix normal. Colonic diverticulosis without evidence for acute diverticulitis. Vascular/Lymphatic: Moderate aorto bi-iliac atherosclerotic disease. No aneurysm. Bulky periaortic lymph nodes measure up to 19 mm in short access (series 2, image 38). Enlarged aortocaval nodes measure up to 2.4 cm. Additional adenopathy extends towards the kidneys bilaterally (series 2, image 28 on the right, series 2, image 33 of the left). Right external iliac nodes measuring up to 17  mm (series 2, image 68). Left external nodes measure up to 11 mm (series 2, image 67). Possible small lymph nodes along the internal iliac chains is well, not well evaluated on this noncontrast examination. Other: No free air or fluid. Musculoskeletal: Innumerable abnormal sclerotic foci seen throughout the visualized spine and pelvis, concerning for  osseous metastases. No definite significant epidural tumor appreciated. No associated pathologic fracture. IMPRESSION: 1. Irregular polypoid mass at the posterior aspect of the bladder, highly concerning for bladder carcinoma. The mass is obstructive with secondary moderate to severe bilateral hydroureteronephrosis. Urological consultation recommended. 2. Extensive bulky adenopathy throughout the abdomen and pelvis as above, consistent with nodal metastases. 3. Widespread osseous metastatic disease. No associated pathologic fracture or other complication. Electronically Signed   By: Jeannine Boga M.D.   On: 10/16/2016 20:33    EKG:   Orders placed or performed during the hospital encounter of 10/16/16  . ED EKG  . ED EKG    IMPRESSION AND PLAN:  Principal Problem:   Bladder mass - mass is obstructing his urinary tract, urology consulted, they recommend IR placement of nephrostomy tubes tomorrow, they will follow to obtain biopsy of the mass. Active Problems:   Urinary obstruction - due to his bladder mass as above, Foley ordered to be placed here   Acute renal failure (ARF) (HCC) - most likely due to obstruction, nephrostomy tubes to be placed, nephrology consult ordered   CAD (coronary artery disease) - continue home meds   HTN (hypertension) - continue home meds  All the records are reviewed and case discussed with ED provider. Management plans discussed with the patient and/or family.  DVT PROPHYLAXIS: SubQ heparin  GI PROPHYLAXIS: None  ADMISSION STATUS: Inpatient  CODE STATUS: DNR Code Status History    This patient does not have a recorded code status. Please follow your organizational policy for patients in this situation.      TOTAL TIME TAKING CARE OF THIS PATIENT: 45 minutes.    Amish Mintzer [place]as 10/16/2016, 10:08 PM  [facility]  Office  [phone]  CC: Primary care physician; No PCP Per Patient

## 2016-10-16 NOTE — ED Triage Notes (Addendum)
Patient presents to the ED with lower back pain radiating into groin area that began after patient was blowing leaves and lifted a ladder.  Patient states pain is all the way across his back.  Patient had a similar episode about 1 month ago.  Patient denies injuring his back.  Patient reports difficulty urinating as well including dysuria and halting urination.  Patient ambulatory to triage in obvious discomfort.  Patient states, "I'm not here for a heart attack and I don't want to be checked for one.  I just want something for my pain."

## 2016-10-16 NOTE — ED Notes (Signed)
Pt began vomiting. Housekeeping called to clean floor.

## 2016-10-16 NOTE — ED Provider Notes (Signed)
[facility] Emergency Department Provider Note   ____________________________________________   First MD Initiated Contact with Patient 10/16/16 1821     (approximate)  I have reviewed the triage vital signs and the nursing notes.   HISTORY  Chief Complaint Back Pain    HPI Johnathan Gould is a 81 y.o. male reports that he began experiencing pain in his lower to left lower back that radiates out towards the left mid to lower back starting early this afternoon/late morning. Reports it started after getting in from blowing leaves and burning leaves.  Described as a sharp discomfort in the left lower back. No chest pain or shortness of breath. No numbness or tingling. No weakness. No nausea or vomiting. Reports the pain was very severe, but a dose of IV pain medicine just before I saw him has improved his pain quite a bit.  Patient reports the pain is in his lower back, he does "not want to be checked out for heart attack". I discussed with him, he is amenable to having blood work and EKG done however as explained this is part of the evaluation for his back pain based on my assessment today.  Patient reports he does have trouble urinating, but this is been on and off problem for years. No new problems with bowels or bladder. Reports he is thought to have a large prostate.  Past Medical History:  Diagnosis Date  . CAD (coronary artery disease)   . Hypertension   . RAD (reactive airway disease)     There are no active problems to display for this patient.   Past Surgical History:  Procedure Laterality Date  . CORONARY ANGIOPLASTY WITH STENT PLACEMENT      Prior to Admission medications   Medication Sig Start Date End Date Taking? Authorizing Provider  nitroGLYCERIN (NITROSTAT) 0.4 MG SL tablet Place 0.4 mg under the tongue every 5 (five) minutes x 3 doses as needed for chest pain. 06/23/16  Yes Historical Provider, MD  aspirin EC 81 MG tablet Take 81  mg by mouth daily.    Historical Provider, MD  lisinopril (PRINIVIL,ZESTRIL) 5 MG tablet Take 5 mg by mouth daily. 08/03/16   Historical Provider, MD  metoprolol (LOPRESSOR) 50 MG tablet Take 50 mg by mouth 2 (two) times daily. 08/03/16   Historical Provider, MD    Allergies Patient has no allergy information on record.  No family history on file.  Social History Social History  Substance Use Topics  . Smoking status: Never Smoker  . Smokeless tobacco: Never Used  . Alcohol use No    Review of Systems Constitutional: No fever/chills Eyes: No visual changes. ENT: No sore throat. Cardiovascular: Denies chest pain. Respiratory: Denies shortness of breath. Gastrointestinal: No abdominal painThough the pain in his left lower back seems to sometimes radiate towards the abdomen.  No nausea, no vomiting.  No diarrhea.  No constipation. Genitourinary: Negative for dysuria. He history of present illness Musculoskeletal: See history of present illness. No fall or injury. Skin: Negative for rash. Neurological: Negative for headaches, focal weakness or numbness.  10-point ROS otherwise negative.  ____________________________________________   PHYSICAL EXAM:  VITAL SIGNS: ED Triage Vitals  Enc Vitals Group     BP 10/16/16 1658 (!) 134/94     Pulse Rate 10/16/16 1658 72     Resp 10/16/16 1658 18     Temp 10/16/16 1658 98 F (36.7 C)     Temp Source 10/16/16 1658 Oral  SpO2 10/16/16 1658 100 %     Weight 10/16/16 1658 160 lb (72.6 kg)     Height 10/16/16 1658 5\' 10"  (1.778 m)     Head Circumference --      Peak Flow --      Pain Score 10/16/16 1659 10     Pain Loc --      Pain Edu? --      Excl. in [place]? --     Constitutional: Alert and oriented. Well appearing and in no acute distress.Patient reportedly was in significant discomfort earlier, now better after fentanyl. Currently resting on the bed conversing with family. Eyes: Conjunctivae are normal. PERRL. EOMI. Head:  Atraumatic. Nose: No congestion/rhinnorhea. Mouth/Throat: Mucous membranes are moist.  Oropharynx non-erythematous. Neck: No stridor.   Cardiovascular: Normal rate, regular rhythm. Grossly normal heart sounds.  Good peripheral circulation. Respiratory: Normal respiratory effort.  No retractions. Lungs CTAB. Gastrointestinal: Soft and nontender. No distention. Mild left CVA tenderness. None the right. Mild per paraspinous tenderness left lumbar region, no midline thoracic or lumbar pain Musculoskeletal: No lower extremity tenderness nor edema.  No joint effusions. Strong and palpable dorsalis pedis pulses bilateral. Normal use of the lower extremities grossly to motor function. Neurologic:  Normal speech and language. No gross focal neurologic deficits are appreciated.  Skin:  Skin is warm, dry and intact. No rash noted. Psychiatric: Mood and affect are normal. Speech and behavior are normal.  ____________________________________________   LABS (all labs ordered are listed, but only abnormal results are displayed)  Labs Reviewed  URINALYSIS, COMPLETE (UACMP) WITH MICROSCOPIC - Abnormal; Notable for the following:       Result Value   Color, Urine STRAW (*)    APPearance CLEAR (*)    Hgb urine dipstick MODERATE (*)    Protein, ur 100 (*)    Bacteria, UA RARE (*)    Squamous Epithelial / LPF 0-5 (*)    All other components within normal limits  CBC WITH DIFFERENTIAL/PLATELET - Abnormal; Notable for the following:    RBC 2.64 (*)    Hemoglobin 7.9 (*)    HCT 23.6 (*)    All other components within normal limits  COMPREHENSIVE METABOLIC PANEL - Abnormal; Notable for the following:    Potassium 5.6 (*)    CO2 16 (*)    BUN 77 (*)    Creatinine, Ser 6.69 (*)    Calcium 7.7 (*)    AST 11 (*)    ALT 8 (*)    Alkaline Phosphatase 240 (*)    GFR calc non Af Amer 7 (*)    GFR calc Af Amer 8 (*)    All other components within normal limits  LIPASE, BLOOD  TROPONIN I    ____________________________________________  EKG  Reviewed and interpreted by me at 1855 Heart rate 70 QRS 120 QTc 440 Normal sinus rhythm, slight J-point elevation noted in V2, nonspecific RSR prime noted in V1. No evidence of acute ischemic change noted. ____________________________________________ c RADIOLOGY  Ct Abdomen Pelvis Wo Contrast  Result Date: 10/16/2016 CLINICAL DATA:  Initial evaluation for acute bilateral flank and lower back pain extending into groin. EXAM: CT ABDOMEN AND PELVIS WITHOUT CONTRAST TECHNIQUE: Multidetector CT imaging of the abdomen and pelvis was performed following the standard protocol without IV contrast. COMPARISON:  None available. FINDINGS: Lower chest: Mild subsegmental atelectasis seen dependently within the visualized lung bases. Visualized lungs are otherwise clear. Fat containing Bochdalek's type hernia present at the left lung base. Cardiomegaly with  prominent coronary artery calcifications partially visualized. Hepatobiliary: Limited noncontrast evaluation of the liver is unremarkable. Gallbladder within normal limits. No biliary dilatation. Pancreas: Pancreas within normal limits. Spleen: Spleen within normal limits. Adrenals/Urinary Tract: Adrenal glands are normal. Bilateral hydroureteronephrosis is seen, severe on the right, more moderate on the left. No radiopaque calculi seen within either kidney or along the course of either dilated ureter. There is irregular polypoid soft tissue filling the posterior bladder lumen, measuring approximately 4.9 x 2.6 cm (series 2, image 75). Exact measurements difficult given the lack of IV contrast. Finding highly concerning for bladder carcinoma. Prostate is enlarged and intimately apposed to the mass within the bladder lumen. Difficult to discern prostate versus mass on this noncontrast examination. Incidental note made of a 1 cm hyperdense left renal cyst (series 2, image 30), indeterminate, but possibly a  complex and/ or hemorrhagic cyst. Stomach/Bowel: Stomach within normal limits. No evidence for bowel obstruction. Appendix normal. Colonic diverticulosis without evidence for acute diverticulitis. Vascular/Lymphatic: Moderate aorto bi-iliac atherosclerotic disease. No aneurysm. Bulky periaortic lymph nodes measure up to 19 mm in short access (series 2, image 38). Enlarged aortocaval nodes measure up to 2.4 cm. Additional adenopathy extends towards the kidneys bilaterally (series 2, image 28 on the right, series 2, image 33 of the left). Right external iliac nodes measuring up to 17 mm (series 2, image 68). Left external nodes measure up to 11 mm (series 2, image 67). Possible small lymph nodes along the internal iliac chains is well, not well evaluated on this noncontrast examination. Other: No free air or fluid. Musculoskeletal: Innumerable abnormal sclerotic foci seen throughout the visualized spine and pelvis, concerning for osseous metastases. No definite significant epidural tumor appreciated. No associated pathologic fracture. IMPRESSION: 1. Irregular polypoid mass at the posterior aspect of the bladder, highly concerning for bladder carcinoma. The mass is obstructive with secondary moderate to severe bilateral hydroureteronephrosis. Urological consultation recommended. 2. Extensive bulky adenopathy throughout the abdomen and pelvis as above, consistent with nodal metastases. 3. Widespread osseous metastatic disease. No associated pathologic fracture or other complication. Electronically Signed   By: Jeannine Boga M.D.   On: 10/16/2016 20:33    ____________________________________________   PROCEDURES  Procedure(s) performed: None  Procedures  Critical Care performed: Yes, see critical care note(s)  CRITICAL CARE Performed by: Delman Kitten   Total critical care time: 40 minutes  Critical care time was exclusive of separately billable procedures and treating other patients.  Critical  care was necessary to treat or prevent imminent or life-threatening deterioration.  Critical care was time spent personally by me on the following activities: development of treatment plan with patient and/or surrogate as well as nursing, discussions with consultants, evaluation of patient's response to treatment, examination of patient, obtaining history from patient or surrogate, ordering and performing treatments and interventions, ordering and review of laboratory studies, ordering and review of radiographic studies, pulse oximetry and re-evaluation of patient's condition.  Patient with acute renal failure, discussed with Dr. Candiss Norse of nephrology and the patient is placed on bicarbonate infusion at his recommendation due to a slightly elevated potassium and acidosis. ____________________________________________   INITIAL IMPRESSION / ASSESSMENT AND PLAN / ED COURSE  Pertinent labs & imaging results that were available during my care of the patient were reviewed by me and considered in my medical decision making (see chart for details).  CT very concerning for bladder mass with acute obstructive. Discussed with Dr. Pilar Jarvis, and he has reviewed the images and recommends admission and he  will see the patient and the morning with likely need for interventional radiology.  Discussed with Dr. Candiss Norse, patient placed on bicarbonate infusion. No evidence of hyperkalemia on EKG, but significant below bicarbonate, elevated BUN and elevated potassium.  Patient, his wife, and his family understanding of plan for admission. Pain improving with fentanyl, but still somewhat ongoing we'll trial morphine. Patient is awake and alert. Understanding of concerns  Clinical Course as of Oct 16 2142  Sat Oct 16, 2016  2106 Discussed case with Dr. Pilar Jarvis  [MQ]    Clinical Course User Index [MQ] Delman Kitten, MD     ____________________________________________   FINAL CLINICAL IMPRESSION(S) / ED  DIAGNOSES  Final diagnoses:  Bladder mass  Acute renal failure, unspecified acute renal failure type ([place])  Hyperkalemia      NEW MEDICATIONS STARTED DURING THIS VISIT:  New Prescriptions   No medications on file     Note:  This document was prepared using Dragon voice recognition software and may include unintentional dictation errors.     Delman Kitten, MD 10/16/16 [phone]

## 2016-10-17 ENCOUNTER — Inpatient Hospital Stay: Payer: Medicare Other

## 2016-10-17 DIAGNOSIS — N179 Acute kidney failure, unspecified: Secondary | ICD-10-CM

## 2016-10-17 DIAGNOSIS — N132 Hydronephrosis with renal and ureteral calculous obstruction: Secondary | ICD-10-CM

## 2016-10-17 DIAGNOSIS — D494 Neoplasm of unspecified behavior of bladder: Secondary | ICD-10-CM

## 2016-10-17 HISTORY — PX: IR GENERIC HISTORICAL: IMG1180011

## 2016-10-17 LAB — BASIC METABOLIC PANEL
ANION GAP: 9 (ref 5–15)
BUN: 72 mg/dL — ABNORMAL HIGH (ref 6–20)
CHLORIDE: 107 mmol/L (ref 101–111)
CO2: 21 mmol/L — ABNORMAL LOW (ref 22–32)
CREATININE: 6.73 mg/dL — AB (ref 0.61–1.24)
Calcium: 7.9 mg/dL — ABNORMAL LOW (ref 8.9–10.3)
GFR calc non Af Amer: 7 mL/min — ABNORMAL LOW (ref >60)
GFR, EST AFRICAN AMERICAN: 8 mL/min — AB (ref >60)
Glucose, Bld: 112 mg/dL — ABNORMAL HIGH (ref 65–99)
POTASSIUM: 6 mmol/L — AB (ref 3.5–5.1)
SODIUM: 137 mmol/L (ref 135–145)

## 2016-10-17 LAB — CBC
HCT: 25.9 % — ABNORMAL LOW (ref 40.0–52.0)
HEMOGLOBIN: 8.5 g/dL — AB (ref 13.0–18.0)
MCH: 29.7 pg (ref 26.0–34.0)
MCHC: 33 g/dL (ref 32.0–36.0)
MCV: 89.9 fL (ref 80.0–100.0)
PLATELETS: 242 10*3/uL (ref 150–440)
RBC: 2.88 MIL/uL — AB (ref 4.40–5.90)
RDW: 14.6 % — ABNORMAL HIGH (ref 11.5–14.5)
WBC: 6.7 10*3/uL (ref 3.8–10.6)

## 2016-10-17 LAB — PSA: PSA: 469 ng/mL — ABNORMAL HIGH (ref 0.00–4.00)

## 2016-10-17 MED ORDER — SODIUM POLYSTYRENE SULFONATE 15 GM/60ML PO SUSP: 30.0000 g | Freq: Once | ORAL | Status: DC

## 2016-10-17 MED ORDER — CEFAZOLIN SODIUM-DEXTROSE 2-4 GM/100ML-% IV SOLN
2.0000 g | INTRAVENOUS | Status: AC
  Administered 2016-10-17: 2 g via INTRAVENOUS
  Filled 2016-10-17 (×3): qty 100

## 2016-10-17 MED ORDER — HEPARIN SODIUM (PORCINE) 5000 UNIT/ML IJ SOLN
5000.0000 [IU] | Freq: Three times a day (TID) | INTRAMUSCULAR | Status: DC
  Administered 2016-10-18 – 2016-10-20 (×8): 5000 [IU] via SUBCUTANEOUS
  Filled 2016-10-17 (×8): qty 1

## 2016-10-17 MED ORDER — SODIUM POLYSTYRENE SULFONATE 15 GM/60ML PO SUSP
30.0000 g | Freq: Once | ORAL | Status: AC
Start: 2016-10-17 — End: 2016-10-17
  Administered 2016-10-17: 15 g via ORAL
  Filled 2016-10-17: qty 120

## 2016-10-17 MED ORDER — PROMETHAZINE HCL 25 MG/ML IJ SOLN
12.5000 mg | Freq: Four times a day (QID) | INTRAMUSCULAR | Status: DC | PRN
  Administered 2016-10-17 – 2016-10-22 (×7): 12.5 mg via INTRAVENOUS
  Filled 2016-10-17 (×7): qty 1

## 2016-10-17 MED ORDER — HYDROMORPHONE HCL 1 MG/ML IJ SOLN
0.5000 mg | INTRAMUSCULAR | Status: DC | PRN
  Administered 2016-10-17 – 2016-10-20 (×10): 0.5 mg via INTRAVENOUS
  Filled 2016-10-17 (×10): qty 0.5

## 2016-10-17 MED ORDER — CEFAZOLIN IN D5W 1 GM/50ML IV SOLN
INTRAVENOUS | Status: AC
  Filled 2016-10-17: qty 50

## 2016-10-17 MED ORDER — MIDAZOLAM HCL 5 MG/5ML IJ SOLN
INTRAMUSCULAR | Status: AC
  Filled 2016-10-17: qty 10

## 2016-10-17 MED ORDER — FENTANYL CITRATE (PF) 100 MCG/2ML IJ SOLN
INTRAMUSCULAR | Status: AC
  Filled 2016-10-17: qty 4

## 2016-10-17 MED ORDER — MIDAZOLAM HCL 2 MG/2ML IJ SOLN
0.5000 mg | Freq: Once | INTRAMUSCULAR | Status: AC
  Administered 2016-10-17: 15:00:00 0.5 mg via INTRAVENOUS

## 2016-10-17 MED ORDER — CEFAZOLIN IN D5W 1 GM/50ML IV SOLN
INTRAVENOUS | Status: AC
Start: 2016-10-17 — End: 2016-10-18
  Filled 2016-10-17: qty 50

## 2016-10-17 MED ORDER — IOPAMIDOL (ISOVUE-300) INJECTION 61%
30.0000 mL | Freq: Once | INTRAVENOUS | Status: AC | PRN
  Administered 2016-10-17: 16:00:00 15 mL via INTRAVENOUS

## 2016-10-17 MED ORDER — NITROGLYCERIN 0.4 MG SL SUBL: 0.4000 mg | SUBLINGUAL_TABLET | SUBLINGUAL | Status: DC | PRN

## 2016-10-17 MED ORDER — FENTANYL CITRATE (PF) 100 MCG/2ML IJ SOLN
25.0000 ug | Freq: Once | INTRAMUSCULAR | Status: AC
  Administered 2016-10-17: 15:00:00 25 ug via INTRAVENOUS

## 2016-10-17 NOTE — Consult Note (Addendum)
11:03 AM   Johnathan Gould 1935-09-22 [name]  Referring provider: Dr. Lou Cal  Chief Complaint  Patient presents with  . Back Pain    HPI: The patient is a 81 year old gentleman with no past GU medical history who presented to the hospital with severe pain in his spine after doing yard work.  During his workup, his creatinine was found to be elevated to 6.69, so a CT scan was performed. This revealed a 5 cm mass arising from the posterior wall of the bladder causing severe bilateral hydroureteronephrosis. He also had extensive retroperitoneal adenopathy as well as extensive sclerotic lesions in his spine and pelvis concerning for osseous metastasis.  The patient reports that his back pain that prompted the hospital has been intermittent over the last few months. It is mainly concentrated in his spine. It has never been this severe until yesterday. He also notes one episode of gross hematuria in the hospital today. He denies any other episodes of gross hematuria. He does note small frequent voids at this time. His PVR in the emergency room was 276 cc.   PMH: Past Medical History:  Diagnosis Date  . CAD (coronary artery disease)   . Hypertension   . RAD (reactive airway disease)     Surgical History: Past Surgical History:  Procedure Laterality Date  . CORONARY ANGIOPLASTY WITH STENT PLACEMENT       Allergies: No Known Allergies  Family History: Family History  Problem Relation Age of Onset  . Hypertension Other     Social History:  reports that he has never smoked. He has never used smokeless tobacco. He reports that he does not drink alcohol or use drugs.  ROS: 12 point ROS negative except for above                                        Physical Exam: BP 115/75 (BP Location: Right Arm)   Pulse 90   Temp 97.4 F (36.3 C) (Oral)   Resp 20   Ht 5\' 10"  (1.778 m)   Wt 180 lb 12.8 oz (82 kg)   SpO2 90%   BMI 25.94 kg/m     Constitutional:  Alert and oriented, No acute distress. HEENT: [place] AT, moist mucus membranes.  Trachea midline, no masses. Cardiovascular: No clubbing, cyanosis, or edema. Respiratory: Normal respiratory effort, no increased work of breathing. GI: Abdomen is soft, nontender, nondistended, no abdominal masses GU: No CVA tenderness.  Skin: No rashes, bruises or suspicious lesions. Lymph: No cervical or inguinal adenopathy. Neurologic: Grossly intact, no focal deficits, moving all 4 extremities. Psychiatric: Normal mood and affect.  Laboratory Data: Lab Results  Component Value Date   WBC 6.7 10/17/2016   HGB 8.5 (L) 10/17/2016   HCT 25.9 (L) 10/17/2016   MCV 89.9 10/17/2016   PLT 242 10/17/2016    Lab Results  Component Value Date   CREATININE 6.73 (H) 10/17/2016    No results found for: PSA  No results found for: TESTOSTERONE  No results found for: HGBA1C  Urinalysis    Component Value Date/Time   COLORURINE STRAW (A) 10/16/2016 1700   APPEARANCEUR CLEAR (A) 10/16/2016 1700   LABSPEC 1.009 10/16/2016 1700   PHURINE 5.0 10/16/2016 1700   GLUCOSEU NEGATIVE 10/16/2016 1700   HGBUR MODERATE (A) 10/16/2016 1700   BILIRUBINUR NEGATIVE 10/16/2016 [place] 10/16/2016 1700  PROTEINUR 100 (A) 10/16/2016 1700   NITRITE NEGATIVE 10/16/2016 1700   LEUKOCYTESUR NEGATIVE 10/16/2016 1700    Pertinent Imaging: CLINICAL DATA:  Initial evaluation for acute bilateral flank and lower back pain extending into groin.  EXAM: CT ABDOMEN AND PELVIS WITHOUT CONTRAST  TECHNIQUE: Multidetector CT imaging of the abdomen and pelvis was performed following the standard protocol without IV contrast.  COMPARISON:  None available.  FINDINGS: Lower chest: Mild subsegmental atelectasis seen dependently within the visualized lung bases. Visualized lungs are otherwise clear. Fat containing Bochdalek's type hernia present at the left lung base. Cardiomegaly with  prominent coronary artery calcifications partially visualized.  Hepatobiliary: Limited noncontrast evaluation of the liver is unremarkable. Gallbladder within normal limits. No biliary dilatation.  Pancreas: Pancreas within normal limits.  Spleen: Spleen within normal limits.  Adrenals/Urinary Tract: Adrenal glands are normal.  Bilateral hydroureteronephrosis is seen, severe on the right, more moderate on the left. No radiopaque calculi seen within either kidney or along the course of either dilated ureter. There is irregular polypoid soft tissue filling the posterior bladder lumen, measuring approximately 4.9 x 2.6 cm (series 2, image 75). Exact measurements difficult given the lack of IV contrast. Finding highly concerning for bladder carcinoma. Prostate is enlarged and intimately apposed to the mass within the bladder lumen. Difficult to discern prostate versus mass on this noncontrast examination.  Incidental note made of a 1 cm hyperdense left renal cyst (series 2, image 30), indeterminate, but possibly a complex and/ or hemorrhagic cyst.  Stomach/Bowel: Stomach within normal limits. No evidence for bowel obstruction. Appendix normal. Colonic diverticulosis without evidence for acute diverticulitis.  Vascular/Lymphatic: Moderate aorto bi-iliac atherosclerotic disease. No aneurysm.  Bulky periaortic lymph nodes measure up to 19 mm in short access (series 2, image 38). Enlarged aortocaval nodes measure up to 2.4 cm. Additional adenopathy extends towards the kidneys bilaterally (series 2, image 28 on the right, series 2, image 33 of the left). Right external iliac nodes measuring up to 17 mm (series 2, image 68). Left external nodes measure up to 11 mm (series 2, image 67). Possible small lymph nodes along the internal iliac chains is well, not well evaluated on this noncontrast examination.  Other: No free air or fluid.  Musculoskeletal: Innumerable abnormal  sclerotic foci seen throughout the visualized spine and pelvis, concerning for osseous metastases. No definite significant epidural tumor appreciated. No associated pathologic fracture.  IMPRESSION: 1. Irregular polypoid mass at the posterior aspect of the bladder, highly concerning for bladder carcinoma. The mass is obstructive with secondary moderate to severe bilateral hydroureteronephrosis. Urological consultation recommended. 2. Extensive bulky adenopathy throughout the abdomen and pelvis as above, consistent with nodal metastases. 3. Widespread osseous metastatic disease. No associated pathologic fracture or other complication.  Assessment & Plan:   1. Bladder mass 2. Bilateral hydroureteronephrosis 3. AKI 4. Retroperitoneal lymphadenopathy 5. Osseous metastasis I had a very long discussion with the patient and his family regarding his new diagnosises as listed above. The bladder mass is most likely bladder cancer though prostate carcinoma is in the differential. At this time, the most important next step is relieving his bilateral hydroureteronephrosis to improve his overall renal function. I discussed with the and patient family the possible ways for this to be done including retrograde ureteral stent placement and antegrade nephrostomy tubes. I'm concerned that due to the location of bladder mass that the trigone is obliterated by this mass and the ureteral orifice openings will be impossible to find. At this time, I have  recommended that the patient undergo bilateral nephrostomy tube placement. This has been discussed with interventional radiology who plans to do it this afternoon. He should remain nothing by mouth until that time. I have canceled his aspirin 81 mg scheduled for today due to this procedure. His PT and PTT are within normal limits. After nephrostomy tube placement, he will need to be closely watched for postobstructive diuresis.  In regards to his newfound  malignancy, I had a very blunt discussion with the patient and his family that he will likely need chemotherapy. At this time, the patient is adamant that he will not undergo chemotherapy. He has had 2 brothers who also died of cancer, and they also elected not to pursue chemotherapy. Once his renal function improves, I do think it would be beneficial to confirm origin of his disease. I will send a PSA now. If this is relatively normal, I would expect his disease to be bladder cancer in origin. We can discuss his desire for chemotherapy in greater detail once his more urgent issue of decreased renal function has improved.  Nickie Retort, MD  [facility] [address] [place], [place] [address] [phone]

## 2016-10-17 NOTE — Progress Notes (Signed)
[place] [facility] NAME: Johnathan Gould    MR#:  [id number]  DATE OF BIRTH:  1935/07/16  SUBJECTIVE:  CHIEF COMPLAINT:   Chief Complaint  Patient presents with  . Back Pain   Complains of low back pain. No change. Unable to urinate. No shortness of breath.  REVIEW OF SYSTEMS:    Review of Systems  Constitutional: Positive for malaise/fatigue. Negative for chills and fever.  HENT: Negative for sore throat.   Eyes: Negative for blurred vision, double vision and pain.  Respiratory: Negative for cough, hemoptysis, shortness of breath and wheezing.   Cardiovascular: Negative for chest pain, palpitations, orthopnea and leg swelling.  Gastrointestinal: Positive for nausea. Negative for abdominal pain, constipation, diarrhea, heartburn and vomiting.  Genitourinary: Negative for dysuria and hematuria.  Musculoskeletal: Positive for back pain. Negative for joint pain.  Skin: Negative for rash.  Neurological: Positive for weakness. Negative for sensory change, speech change, focal weakness and headaches.  Endo/Heme/Allergies: Does not bruise/bleed easily.  Psychiatric/Behavioral: Negative for depression. The patient is not nervous/anxious.     DRUG ALLERGIES:  No Known Allergies  VITALS:  Blood pressure 115/75, pulse 90, temperature 97.4 F (36.3 C), temperature source Oral, resp. rate 20, height 5\' 10"  (1.778 m), weight 82 kg (180 lb 12.8 oz), SpO2 90 %.  PHYSICAL EXAMINATION:   Physical Exam  GENERAL:  81 y.o.-year-old patient lying in the bed with no acute distress.  EYES: Pupils equal, round, reactive to light and accommodation. No scleral icterus. Extraocular muscles intact.  HEENT: Head atraumatic, normocephalic. Oropharynx and nasopharynx clear.  NECK:  Supple, no jugular venous distention. No thyroid enlargement, no tenderness.  LUNGS: Normal breath sounds bilaterally, no wheezing, rales, rhonchi. No use of accessory muscles of  respiration.  CARDIOVASCULAR: S1, S2 normal. No murmurs, rubs, or gallops.  ABDOMEN: Soft, nontender, nondistended. Bowel sounds present. No organomegaly or mass. Suprapubic fullness and tenderness EXTREMITIES: No cyanosis, clubbing or edema b/l.    NEUROLOGIC: Cranial nerves II through XII are intact. No focal Motor or sensory deficits b/l.   PSYCHIATRIC: The patient is alert and oriented x 3.  SKIN: No obvious rash, lesion, or ulcer.   LABORATORY PANEL:   CBC  Recent Labs Lab 10/17/16 0510  WBC 6.7  HGB 8.5*  HCT 25.9*  PLT 242   ------------------------------------------------------------------------------------------------------------------ Chemistries   Recent Labs Lab 10/16/16 1853 10/17/16 0510  NA 136 137  K 5.6* 6.0*  CL 108 107  CO2 16* 21*  GLUCOSE 96 112*  BUN 77* 72*  CREATININE 6.69* 6.73*  CALCIUM 7.7* 7.9*  AST 11*  --   ALT 8*  --   ALKPHOS 240*  --   BILITOT 0.3  --    ------------------------------------------------------------------------------------------------------------------  Cardiac Enzymes  Recent Labs Lab 10/16/16 1853  TROPONINI <0.03   ------------------------------------------------------------------------------------------------------------------  RADIOLOGY:  Ct Abdomen Pelvis Wo Contrast  Result Date: 10/16/2016 CLINICAL DATA:  Initial evaluation for acute bilateral flank and lower back pain extending into groin. EXAM: CT ABDOMEN AND PELVIS WITHOUT CONTRAST TECHNIQUE: Multidetector CT imaging of the abdomen and pelvis was performed following the standard protocol without IV contrast. COMPARISON:  None available. FINDINGS: Lower chest: Mild subsegmental atelectasis seen dependently within the visualized lung bases. Visualized lungs are otherwise clear. Fat containing Bochdalek's type hernia present at the left lung base. Cardiomegaly with prominent coronary artery calcifications partially visualized. Hepatobiliary: Limited  noncontrast evaluation of the liver is unremarkable. Gallbladder within normal limits. No biliary dilatation.  Pancreas: Pancreas within normal limits. Spleen: Spleen within normal limits. Adrenals/Urinary Tract: Adrenal glands are normal. Bilateral hydroureteronephrosis is seen, severe on the right, more moderate on the left. No radiopaque calculi seen within either kidney or along the course of either dilated ureter. There is irregular polypoid soft tissue filling the posterior bladder lumen, measuring approximately 4.9 x 2.6 cm (series 2, image 75). Exact measurements difficult given the lack of IV contrast. Finding highly concerning for bladder carcinoma. Prostate is enlarged and intimately apposed to the mass within the bladder lumen. Difficult to discern prostate versus mass on this noncontrast examination. Incidental note made of a 1 cm hyperdense left renal cyst (series 2, image 30), indeterminate, but possibly a complex and/ or hemorrhagic cyst. Stomach/Bowel: Stomach within normal limits. No evidence for bowel obstruction. Appendix normal. Colonic diverticulosis without evidence for acute diverticulitis. Vascular/Lymphatic: Moderate aorto bi-iliac atherosclerotic disease. No aneurysm. Bulky periaortic lymph nodes measure up to 19 mm in short access (series 2, image 38). Enlarged aortocaval nodes measure up to 2.4 cm. Additional adenopathy extends towards the kidneys bilaterally (series 2, image 28 on the right, series 2, image 33 of the left). Right external iliac nodes measuring up to 17 mm (series 2, image 68). Left external nodes measure up to 11 mm (series 2, image 67). Possible small lymph nodes along the internal iliac chains is well, not well evaluated on this noncontrast examination. Other: No free air or fluid. Musculoskeletal: Innumerable abnormal sclerotic foci seen throughout the visualized spine and pelvis, concerning for osseous metastases. No definite significant epidural tumor appreciated.  No associated pathologic fracture. IMPRESSION: 1. Irregular polypoid mass at the posterior aspect of the bladder, highly concerning for bladder carcinoma. The mass is obstructive with secondary moderate to severe bilateral hydroureteronephrosis. Urological consultation recommended. 2. Extensive bulky adenopathy throughout the abdomen and pelvis as above, consistent with nodal metastases. 3. Widespread osseous metastatic disease. No associated pathologic fracture or other complication. Electronically Signed   By: Jeannine Boga M.D.   On: 10/16/2016 20:33     ASSESSMENT AND PLAN:   * Acute renal failure due to bilateral hydronephrosis with bladder mass We'll continue the bicarbonate fluid but reduce fluids. Waiting for interventional radiology to place bilateral nephrostomy tubes. Appreciate urology input. Discussed at length with family regarding options and plan. Also discussed regarding need for dialysis if there is no improvement in acute kidney injury.  * Hyperkalemia due to acute renal failure should improve once nephrostomy tubes are placed. Discussed with Dr. Candiss Norse of nephrology.  * Hypertension Home medications Start lisinopril  * Anemia of chronic disease  * DVT prophylaxis with heparin after his procedure  All the records are reviewed and case discussed with Care Management/Social Workerr. Management plans discussed with the patient, family and they are in agreement.  CODE STATUS: DNR  DVT Prophylaxis: SCDs  TOTAL TIME TAKING CARE OF THIS PATIENT: 35 minutes.   POSSIBLE D/C IN 2-3 DAYS, DEPENDING ON CLINICAL CONDITION.  Hillary Bow R M.D on 10/17/2016 at 11:28 AM  Between 7am to 6pm - Pager - [phone]  After 6pm go to [email or web address] - password [email or web address] [facility]  Office  [phone]  CC: Primary care physician; No PCP Per Patient  Note: This dictation was prepared with Dragon dictation along with smaller phrase technology. Any  transcriptional errors that result from this process are unintentional.

## 2016-10-17 NOTE — Care Management Important Message (Signed)
Important Message  Patient Details  Name: Johnathan Gould MRN: [id number] Date of Birth: November 15, 1934   Medicare Important Message Given:  Yes    Meena Barrantes A, RN 10/17/2016, 4:49 PM

## 2016-10-17 NOTE — Progress Notes (Signed)
Pt complains of pain 10/10.  Received Morphine 4 mg IVP with no relief.  Pt also with large emesis and too early for more zofran.  Spoke with Dr Jannifer Franklin and order to alternate phenergan with zofran.  New order for pain medication given. Dorna Bloom RN

## 2016-10-17 NOTE — Plan of Care (Signed)
Problem: Fluid Volume: Goal: Ability to maintain a balanced intake and output will improve Outcome: Progressing Nausea and emesis x 2.  Zofran and Phenergan to be alternated.  Last dose of phenergan 12/17 0109 was effective.  Problem: Nutrition: Goal: Adequate nutrition will be maintained Outcome: Not Applicable Date Met: 48/84/57 NPO

## 2016-10-17 NOTE — Procedures (Signed)
Interventional Radiology Procedure Note  Procedure:  Bilateral percutaneous nephrostomy tube placement  Complications:  None  Estimated Blood Loss: < 10 mL  Findings:  Bilateral 10 Fr PCN's placed and formed in renal pelvis; draining well after placement.  Venetia Night. Kathlene Cote, M.D Pager:  [phone]

## 2016-10-17 NOTE — Progress Notes (Signed)
Spoke with the patient's wife over the phone regarding PSA level of 469. Prostate cancer is the likely source of patient's bladder mass, metastatic disease, blt hydroureter, AKI, & back pain. I briefly discussed androgen deprivation therapy over the phone with his wife. He will benefit from firmagon. Urology will discuss ADT with patient and family in greater detail Monday morning.

## 2016-10-17 NOTE — Consult Note (Signed)
Chief Complaint: Patient was seen in consultation today for bilateral nephrostomy tube placement at the request of Dr. Baruch Gouty  Referring Physician(s): Baruch Gouty  Patient Status: [facility] - In-pt  History of Present Illness: Johnathan Gould is a 81 y.o. male presenting with severe bilateral obstructive uropathy secondary to large bladder mass and acute renal failure and hyperkalemia.  Consulted for bilateral percutaneous nephrostomy tube placement for acute alleviation of renal obstruction.  Past Medical History:  Diagnosis Date  . CAD (coronary artery disease)   . Hypertension   . RAD (reactive airway disease)     Past Surgical History:  Procedure Laterality Date  . CORONARY ANGIOPLASTY WITH STENT PLACEMENT      Allergies: Patient has no allergy information on record.  Medications: Prior to Admission medications   Medication Sig Start Date End Date Taking? Authorizing Provider  lisinopril (PRINIVIL,ZESTRIL) 5 MG tablet Take 5 mg by mouth daily. 08/03/16  Yes Historical Provider, MD  metoprolol (LOPRESSOR) 50 MG tablet Take 50 mg by mouth 2 (two) times daily. 08/03/16  Yes Historical Provider, MD  nitroGLYCERIN (NITROSTAT) 0.4 MG SL tablet Place 0.4 mg under the tongue every 5 (five) minutes x 3 doses as needed for chest pain. 06/23/16  Yes Historical Provider, MD     Family History  Problem Relation Age of Onset  . Hypertension Other     Social History   Social History  . Marital status: Married    Spouse name: N/A  . Number of children: N/A  . Years of education: N/A   Social History Main Topics  . Smoking status: Never Smoker  . Smokeless tobacco: Never Used  . Alcohol use No  . Drug use: No  . Sexual activity: Yes   Other Topics Concern  . None   Social History Narrative  . None    Review of Systems: A 12 point ROS discussed and pertinent positives are indicated in the HPI above.  All other systems are negative.  Review of Systems    Constitutional: Positive for activity change and fatigue.  Respiratory: Negative.   Cardiovascular: Negative.   Gastrointestinal: Negative.   Genitourinary: Positive for flank pain and hematuria. Negative for difficulty urinating, dysuria, enuresis and frequency.  Neurological: Negative.     Vital Signs: BP 133/75 (BP Location: Right Arm)   Pulse 90   Temp 97.4 F (36.3 C) (Oral)   Resp 20   Ht 5\' 10"  (1.778 m)   Wt 180 lb 12.8 oz (82 kg)   SpO2 95%   BMI 25.94 kg/m   Physical Exam  Constitutional:  Somnolent  Neck: No JVD present.  Pulmonary/Chest: No respiratory distress.  Abdominal: Soft. He exhibits no distension. There is no tenderness.    Mallampati Score:  MD Evaluation Airway: WNL Heart: WNL Abdomen: WNL Chest/ Lungs: WNL ASA  Classification: 2 Mallampati/Airway Score: One  Imaging: Ct Abdomen Pelvis Wo Contrast  Result Date: 10/16/2016 CLINICAL DATA:  Initial evaluation for acute bilateral flank and lower back pain extending into groin. EXAM: CT ABDOMEN AND PELVIS WITHOUT CONTRAST TECHNIQUE: Multidetector CT imaging of the abdomen and pelvis was performed following the standard protocol without IV contrast. COMPARISON:  None available. FINDINGS: Lower chest: Mild subsegmental atelectasis seen dependently within the visualized lung bases. Visualized lungs are otherwise clear. Fat containing Bochdalek's type hernia present at the left lung base. Cardiomegaly with prominent coronary artery calcifications partially visualized. Hepatobiliary: Limited noncontrast evaluation of the liver is unremarkable. Gallbladder within normal limits.  No biliary dilatation. Pancreas: Pancreas within normal limits. Spleen: Spleen within normal limits. Adrenals/Urinary Tract: Adrenal glands are normal. Bilateral hydroureteronephrosis is seen, severe on the right, more moderate on the left. No radiopaque calculi seen within either kidney or along the course of either dilated ureter.  There is irregular polypoid soft tissue filling the posterior bladder lumen, measuring approximately 4.9 x 2.6 cm (series 2, image 75). Exact measurements difficult given the lack of IV contrast. Finding highly concerning for bladder carcinoma. Prostate is enlarged and intimately apposed to the mass within the bladder lumen. Difficult to discern prostate versus mass on this noncontrast examination. Incidental note made of a 1 cm hyperdense left renal cyst (series 2, image 30), indeterminate, but possibly a complex and/ or hemorrhagic cyst. Stomach/Bowel: Stomach within normal limits. No evidence for bowel obstruction. Appendix normal. Colonic diverticulosis without evidence for acute diverticulitis. Vascular/Lymphatic: Moderate aorto bi-iliac atherosclerotic disease. No aneurysm. Bulky periaortic lymph nodes measure up to 19 mm in short access (series 2, image 38). Enlarged aortocaval nodes measure up to 2.4 cm. Additional adenopathy extends towards the kidneys bilaterally (series 2, image 28 on the right, series 2, image 33 of the left). Right external iliac nodes measuring up to 17 mm (series 2, image 68). Left external nodes measure up to 11 mm (series 2, image 67). Possible small lymph nodes along the internal iliac chains is well, not well evaluated on this noncontrast examination. Other: No free air or fluid. Musculoskeletal: Innumerable abnormal sclerotic foci seen throughout the visualized spine and pelvis, concerning for osseous metastases. No definite significant epidural tumor appreciated. No associated pathologic fracture. IMPRESSION: 1. Irregular polypoid mass at the posterior aspect of the bladder, highly concerning for bladder carcinoma. The mass is obstructive with secondary moderate to severe bilateral hydroureteronephrosis. Urological consultation recommended. 2. Extensive bulky adenopathy throughout the abdomen and pelvis as above, consistent with nodal metastases. 3. Widespread osseous metastatic  disease. No associated pathologic fracture or other complication. Electronically Signed   By: Jeannine Boga M.D.   On: 10/16/2016 20:33    Labs:  CBC:  Recent Labs  10/16/16 1853 10/17/16 0510  WBC 4.9 6.7  HGB 7.9* 8.5*  HCT 23.6* 25.9*  PLT 239 242    COAGS:  Recent Labs  10/16/16 1853  INR 1.01  APTT 33    BMP:  Recent Labs  10/16/16 1853 10/17/16 0510  NA 136 137  K 5.6* 6.0*  CL 108 107  CO2 16* 21*  GLUCOSE 96 112*  BUN 77* 72*  CALCIUM 7.7* 7.9*  CREATININE 6.69* 6.73*  GFRNONAA 7* 7*  GFRAA 8* 8*    LIVER FUNCTION TESTS:  Recent Labs  10/16/16 1853  BILITOT 0.3  AST 11*  ALT 8*  ALKPHOS 240*  PROT 6.9  ALBUMIN 3.5     Assessment and Plan:  CT reviewed from last night.  Will proceed with bilateral PCN placement today.  Consent obtained from patient's wife.  Risks discussed with family.  Risks and benefits discussed with the patient's family including, but not limited to infection, bleeding, significant bleeding causing loss or decrease in renal function or damage to adjacent structures.  Consent signed and in chart.  Thank you for this interesting consult.  I greatly enjoyed meeting Johnathan Gould and look forward to participating in their care.  A copy of this report was sent to the requesting provider on this date.  Electronically SignedAletta Edouard T 10/17/2016, 2:42 PM   I spent a total of  38 Minutes in face to face in clinical consultation, greater than 50% of which was counseling/coordinating care for bilateral hydronephrosis, ureteral obstruction and nephrostomy tube placement.

## 2016-10-17 NOTE — Progress Notes (Signed)
Paged Dr. Kathlene Cote. Spoke with his staff member on phone. Stated he wanted patient in IR at 1415/ Spoke with on call staff, Darnelle Maffucci and Juliann Pulse, told them I had gotten confirmation for time.

## 2016-10-17 NOTE — Progress Notes (Signed)
Due to difficulty of nurse ability charting during bilateral nephrostomy tube.... Will add in this note for official record that a time out was called pre-procedure in the room by me at 14:55 with Dr. Kathlene Cote, Sharen Heck, Berenice Primas, and Travis [place] in attendance.  Right patient, right procedure and allergies were confirmed and agreed upon.

## 2016-10-17 NOTE — Progress Notes (Addendum)
Dilaudid IV x2 for back pain and "pain all over." Held NPO for procedure -possible neprhostomy tubes. IVF's with bicarb continued. Voiding 50 -148ml with urine clear yellow with no visible blood. Family at bedside. Completed preparation for procedure; advised consent has NOT been done. Sleeping at frequent intervals. Transported to procedures by Nelida Gores

## 2016-10-17 NOTE — Progress Notes (Signed)
Pt returned from bilateral nephrostomy tube insertions with pink/red urinary output. VSS with 02 maintained with pulse ox dipping to 90% when sleeping deep. Nausea with kayexalate with only 1/2 dose given with small amount vomiting. Zofran given with relief. Sipped on ginger ale;  Dinner not eaten.

## 2016-10-17 NOTE — Consult Note (Signed)
Date: 10/17/2016                  Patient Name:  WENZEL GARTRELL  MRN: [name]  DOB: 14-Nov-1934  Age / Sex: 81 y.o., male         PCP: No PCP Per Patient                 Service Requesting Consult: Internal medicine                 Reason for Consult: ARF            History of Present Illness: Patient is a 81 y.o. male with medical problems of coronary disease MI in 2014, angioplasty with stent placement, hypertension, enlarged prostate, who was admitted to [facility] on 10/16/2016 for evaluation of acute onset low back pain. Upon presentation, his creatinine was found to be 6.69.  Only known prior creatinine is 0.94 from January 2014. CT scan of the abdomen showed posterior bladder mass concerning for bladder carcinoma.  It is obstructing with secondary moderate to severe bilateral hydronephrosis.  Extensive bulky adenopathy throughout abdomen and pelvis with nodal metastasis was noted. Patient was evaluated by urologist and he is scheduled for nephrostomy tubes placement this afternoon. Other electrolyte abnormalities include acidosis with bicarbonate of 16, hyperkalemia of 5.6 last night which has worsened to 6.0 this morning. He is anemic with hemoglobin of 8.5 Patient does not have any acute complaints.  He reports some lower abdominal discomfort and pain.   Medications: Outpatient medications: Prescriptions Prior to Admission  Medication Sig Dispense Refill Last Dose  . lisinopril (PRINIVIL,ZESTRIL) 5 MG tablet Take 5 mg by mouth daily.   10/15/2016 at Unknown time  . metoprolol (LOPRESSOR) 50 MG tablet Take 50 mg by mouth 2 (two) times daily.   10/15/2016 at Unknown time  . nitroGLYCERIN (NITROSTAT) 0.4 MG SL tablet Place 0.4 mg under the tongue every 5 (five) minutes x 3 doses as needed for chest pain.   prn at prn    Current medications: Current Facility-Administered Medications  Medication Dose Route Frequency Provider Last Rate Last Dose  . acetaminophen (TYLENOL) tablet  650 mg  650 mg Oral Q6H PRN Lance Coon, MD       Or  . acetaminophen (TYLENOL) suppository 650 mg  650 mg Rectal Q6H PRN Lance Coon, MD      . ceFAZolin (ANCEF) IVPB 2g/100 mL premix  2 g Intravenous to XRAY Saverio Danker, PA-C      . Derrill Memo ON 10/18/2016] heparin injection 5,000 Units  5,000 Units Subcutaneous Q8H Saverio Danker, PA-C      . HYDROmorphone (DILAUDID) injection 0.5 mg  0.5 mg Intravenous Q4H PRN Lance Coon, MD   0.5 mg at 10/17/16 1245  . metoprolol (LOPRESSOR) tablet 50 mg  50 mg Oral BID Lance Coon, MD   Stopped at 10/17/16 [phone]  . nitroGLYCERIN (NITROSTAT) SL tablet 0.4 mg  0.4 mg Sublingual Q5 min PRN Hillary Bow, MD      . ondansetron [facility]) tablet 4 mg  4 mg Oral Q6H PRN Lance Coon, MD       Or  . ondansetron [facility]) injection 4 mg  4 mg Intravenous Q6H PRN Lance Coon, MD   4 mg at 10/16/16 2303  . oxyCODONE-acetaminophen (PERCOCET/ROXICET) 5-325 MG per tablet 1 tablet  1 tablet Oral Q30 min PRN Delman Kitten, MD   1 tablet at 10/16/16 1708  . promethazine (PHENERGAN) injection 12.5 mg  12.5  mg Intravenous Q6H PRN Lance Coon, MD   12.5 mg at 10/17/16 0109  . sodium bicarbonate 150 mEq in dextrose 5 % 1,000 mL infusion   Intravenous Continuous Hillary Bow, MD 30 mL/hr at 10/17/16 1058    . sodium polystyrene (KAYEXALATE) 15 GM/60ML suspension 30 g  30 g Oral Once Hillary Bow, MD          Allergies: No Known Allergies    Past Medical History: Past Medical History:  Diagnosis Date  . CAD (coronary artery disease)   . Hypertension   . RAD (reactive airway disease)      Past Surgical History: Past Surgical History:  Procedure Laterality Date  . CORONARY ANGIOPLASTY WITH STENT PLACEMENT       Family History: Family History  Problem Relation Age of Onset  . Hypertension Other      Social History: Social History   Social History  . Marital status: Married    Spouse name: N/A  . Number of children: N/A  . Years of education: N/A    Occupational History  . Not on file.   Social History Main Topics  . Smoking status: Never Smoker  . Smokeless tobacco: Never Used  . Alcohol use No  . Drug use: No  . Sexual activity: Yes   Other Topics Concern  . Not on file   Social History Narrative  . No narrative on file     Review of Systems: Gen: not fevers or chills HEENT: decreased hearing CV: no chest pain or shortness of breath Resp: no coughing or sputum production GI: appetite has been low last couple of days otherwise no complaints GU : hematuria today.  Denies noting hematuria previously MS: no acute joint pains at present Derm:  no complaints Psych:no complaints Heme: no complaints Neuro: no complaints Endocrine  Vital Signs: Blood pressure 115/75, pulse 90, temperature 97.4 F (36.3 C), temperature source Oral, resp. rate 20, height 5\' 10"  (1.778 m), weight 82 kg (180 lb 12.8 oz), SpO2 90 %.   Intake/Output Summary (Last 24 hours) at 10/17/16 1248 Last data filed at 10/17/16 1058  Gross per 24 hour  Intake             1855 ml  Output              775 ml  Net             1080 ml    Weight trends: Filed Weights   10/16/16 1658 10/16/16 2355 10/17/16 0828  Weight: 72.6 kg (160 lb) 80.7 kg (178 lb) 82 kg (180 lb 12.8 oz)    Physical Exam: General:  no acute distress, laying in the bed  HEENT Anicteric, moist oral mucous membranes  Neck:  supple,  Lungs: normal breathing effort, clear to auscultation  Heart::  notable or gallop  Abdomen: Soft, nondistended, slightly abdominal tenderness  Extremities:  no peripheral edema  Neurologic: Alert, oriented, decreased hearing  Skin: No acute rashes             Lab results: Basic Metabolic Panel:  Recent Labs Lab 10/16/16 1853 10/17/16 0510  NA 136 137  K 5.6* 6.0*  CL 108 107  CO2 16* 21*  GLUCOSE 96 112*  BUN 77* 72*  CREATININE 6.69* 6.73*  CALCIUM 7.7* 7.9*    Liver Function Tests:  Recent Labs Lab 10/16/16 1853  AST  11*  ALT 8*  ALKPHOS 240*  BILITOT 0.3  PROT 6.9  ALBUMIN 3.5  Recent Labs Lab 10/16/16 1853  LIPASE 29   No results for input(s): AMMONIA in the last 168 hours.  CBC:  Recent Labs Lab 10/16/16 1853 10/17/16 0510  WBC 4.9 6.7  NEUTROABS 3.0  --   HGB 7.9* 8.5*  HCT 23.6* 25.9*  MCV 89.3 89.9  PLT 239 242    Cardiac Enzymes:  Recent Labs Lab 10/16/16 1853  TROPONINI <0.03    BNP: Invalid input(s): POCBNP  CBG: No results for input(s): GLUCAP in the last 168 hours.  Microbiology: No results found for this or any previous visit (from the past 720 hour(s)).   Coagulation Studies:  Recent Labs  10/16/16 1853  LABPROT 13.3  INR 1.01    Urinalysis:  Recent Labs  10/16/16 1700  COLORURINE STRAW*  LABSPEC 1.009  PHURINE 5.0  GLUCOSEU NEGATIVE  HGBUR MODERATE*  BILIRUBINUR NEGATIVE  KETONESUR NEGATIVE  PROTEINUR 100*  NITRITE NEGATIVE  LEUKOCYTESUR NEGATIVE        Imaging: Ct Abdomen Pelvis Wo Contrast  Result Date: 10/16/2016 CLINICAL DATA:  Initial evaluation for acute bilateral flank and lower back pain extending into groin. EXAM: CT ABDOMEN AND PELVIS WITHOUT CONTRAST TECHNIQUE: Multidetector CT imaging of the abdomen and pelvis was performed following the standard protocol without IV contrast. COMPARISON:  None available. FINDINGS: Lower chest: Mild subsegmental atelectasis seen dependently within the visualized lung bases. Visualized lungs are otherwise clear. Fat containing Bochdalek's type hernia present at the left lung base. Cardiomegaly with prominent coronary artery calcifications partially visualized. Hepatobiliary: Limited noncontrast evaluation of the liver is unremarkable. Gallbladder within normal limits. No biliary dilatation. Pancreas: Pancreas within normal limits. Spleen: Spleen within normal limits. Adrenals/Urinary Tract: Adrenal glands are normal. Bilateral hydroureteronephrosis is seen, severe on the right, more moderate  on the left. No radiopaque calculi seen within either kidney or along the course of either dilated ureter. There is irregular polypoid soft tissue filling the posterior bladder lumen, measuring approximately 4.9 x 2.6 cm (series 2, image 75). Exact measurements difficult given the lack of IV contrast. Finding highly concerning for bladder carcinoma. Prostate is enlarged and intimately apposed to the mass within the bladder lumen. Difficult to discern prostate versus mass on this noncontrast examination. Incidental note made of a 1 cm hyperdense left renal cyst (series 2, image 30), indeterminate, but possibly a complex and/ or hemorrhagic cyst. Stomach/Bowel: Stomach within normal limits. No evidence for bowel obstruction. Appendix normal. Colonic diverticulosis without evidence for acute diverticulitis. Vascular/Lymphatic: Moderate aorto bi-iliac atherosclerotic disease. No aneurysm. Bulky periaortic lymph nodes measure up to 19 mm in short access (series 2, image 38). Enlarged aortocaval nodes measure up to 2.4 cm. Additional adenopathy extends towards the kidneys bilaterally (series 2, image 28 on the right, series 2, image 33 of the left). Right external iliac nodes measuring up to 17 mm (series 2, image 68). Left external nodes measure up to 11 mm (series 2, image 67). Possible small lymph nodes along the internal iliac chains is well, not well evaluated on this noncontrast examination. Other: No free air or fluid. Musculoskeletal: Innumerable abnormal sclerotic foci seen throughout the visualized spine and pelvis, concerning for osseous metastases. No definite significant epidural tumor appreciated. No associated pathologic fracture. IMPRESSION: 1. Irregular polypoid mass at the posterior aspect of the bladder, highly concerning for bladder carcinoma. The mass is obstructive with secondary moderate to severe bilateral hydroureteronephrosis. Urological consultation recommended. 2. Extensive bulky adenopathy  throughout the abdomen and pelvis as above, consistent with nodal metastases. 3. Widespread  osseous metastatic disease. No associated pathologic fracture or other complication. Electronically Signed   By: Jeannine Boga M.D.   On: 10/16/2016 20:33      Assessment & Plan: Pt is a 81 y.o. yo male with medical problems of coronary disease MI in 2014, angioplasty with stent placement, hypertension, enlarged prostate, who was admitted to [facility] on 10/16/2016 for evaluation of acute onset low back pain. Upon presentation, his creatinine was found to be 6.69.  Only known prior creatinine is 0.94 from January 2014.   1.  Acute renal failure secondary to bladder mass causing obstructive uropathy and bilateral hydronephrosis patient is scheduled for bilateral nephrostomy tube placement this afternoon. 2.  Hyperkalemia secondary to obstructive uropathy  Plan: After urostomy tubes placement, renal failure and hyperkalemia are expected to improve. If not improved, we will consider hemodialysis. Monitor electrolytes closely.  Agree with holding lisinopril. Continue low-dose bicarbonate to correct acidosis Follow urologist recommendations.  Will follow closely

## 2016-10-18 DIAGNOSIS — C61 Malignant neoplasm of prostate: Secondary | ICD-10-CM

## 2016-10-18 DIAGNOSIS — N3289 Other specified disorders of bladder: Secondary | ICD-10-CM

## 2016-10-18 LAB — BASIC METABOLIC PANEL
Anion gap: 10 (ref 5–15)
BUN: 72 mg/dL — AB (ref 6–20)
CALCIUM: 7.2 mg/dL — AB (ref 8.9–10.3)
CHLORIDE: 105 mmol/L (ref 101–111)
CO2: 23 mmol/L (ref 22–32)
CREATININE: 7.25 mg/dL — AB (ref 0.61–1.24)
GFR, EST AFRICAN AMERICAN: 7 mL/min — AB (ref >60)
GFR, EST NON AFRICAN AMERICAN: 6 mL/min — AB (ref >60)
Glucose, Bld: 106 mg/dL — ABNORMAL HIGH (ref 65–99)
Potassium: 4.9 mmol/L (ref 3.5–5.1)
SODIUM: 138 mmol/L (ref 135–145)

## 2016-10-18 MED ORDER — SODIUM CHLORIDE 0.9 % IV SOLN
INTRAVENOUS | Status: DC
  Administered 2016-10-18 (×2): via INTRAVENOUS

## 2016-10-18 MED ORDER — SODIUM CHLORIDE 0.9 % IV SOLN
INTRAVENOUS | Status: DC
  Administered 2016-10-18 – 2016-10-23 (×11): via INTRAVENOUS

## 2016-10-18 MED ORDER — HYDROCODONE-ACETAMINOPHEN 5-325 MG PO TABS
1.0000 | ORAL_TABLET | Freq: Four times a day (QID) | ORAL | Status: DC | PRN
  Administered 2016-10-18 – 2016-10-23 (×11): 1 via ORAL
  Filled 2016-10-18 (×10): qty 1

## 2016-10-18 NOTE — Care Management Note (Signed)
Case Management Note  Patient Details  Name: Johnathan Gould MRN: [name] Date of Birth: Jun 25, 1935  Subjective/Objective:       Received a telephone request from Dr Hollice Espy requesting that CM obtain an insurance authorization for Johnathan Gould to receive a one time 250mg  IV dose of Degarelix per newly diagnosed prostate cancer. Reasons for this medication are 1) concern for a flare, 2) extensive disease.            Action/Plan:   Expected Discharge Date:                  Expected Discharge Plan:     In-House Referral:     Discharge planning Services     Post Acute Care Choice:    Choice offered to:     DME Arranged:    DME Agency:     HH Arranged:    HH Agency:     Status of Service:     If discussed at [employer] of Stay Meetings, dates discussed:    Additional Comments:  [name] A, RN 10/18/2016, 4:12 PM

## 2016-10-18 NOTE — Progress Notes (Signed)
[facility] [place] NAME: Johnathan Gould    MR#:  [name]  DATE OF BIRTH:  [birth year]  SUBJECTIVE:  CHIEF COMPLAINT:   Chief Complaint  Patient presents with  . Back Pain   Complains of low back pain. No change. Unable to urinate. No shortness of breath. Status post bilateral nephrostomy tubes placed on 10/17/2016  REVIEW OF SYSTEMS:    Review of Systems  Constitutional: Positive for malaise/fatigue. Negative for chills and fever.  HENT: Negative for sore throat.   Eyes: Negative for blurred vision, double vision and pain.  Respiratory: Negative for cough, hemoptysis, shortness of breath and wheezing.   Cardiovascular: Negative for chest pain, palpitations, orthopnea and leg swelling.  Gastrointestinal: Positive for nausea. Negative for abdominal pain, constipation, diarrhea, heartburn and vomiting.  Genitourinary: Negative for dysuria and hematuria.  Musculoskeletal: Positive for back pain. Negative for joint pain.  Skin: Negative for rash.  Neurological: Positive for weakness. Negative for sensory change, speech change, focal weakness and headaches.  Endo/Heme/Allergies: Does not bruise/bleed easily.  Psychiatric/Behavioral: Negative for depression. The patient is not nervous/anxious.     DRUG ALLERGIES:  Not on File  VITALS:  Blood pressure 132/61, pulse 90, temperature 98.4 F (36.9 C), temperature source Oral, resp. rate 18, height 5\' 10"  (1.778 m), weight 83.7 kg (184 lb 9.6 oz), SpO2 94 %.  PHYSICAL EXAMINATION:   Physical Exam  GENERAL:  81 y.o.-year-old patient lying in the bed with no acute distress.  EYES: Pupils equal, round, reactive to light and accommodation. No scleral icterus. Extraocular muscles intact.  HEENT: Head atraumatic, normocephalic. Oropharynx and nasopharynx clear.  NECK:  Supple, no jugular venous distention. No thyroid enlargement, no tenderness.  LUNGS: Normal breath sounds bilaterally, no  wheezing, rales, rhonchi. No use of accessory muscles of respiration.  CARDIOVASCULAR: S1, S2 normal. No murmurs, rubs, or gallops.  ABDOMEN: Soft, nontender, nondistended. Bowel sounds present. No organomegaly or mass. Bilateral nephrostomy tubes in place with blood-tinged urine EXTREMITIES: No cyanosis, clubbing or edema b/l.    NEUROLOGIC: Cranial nerves II through XII are intact. No focal Motor or sensory deficits b/l.   PSYCHIATRIC: The patient is alert and oriented x 3.  SKIN: No obvious rash, lesion, or ulcer.   LABORATORY PANEL:   CBC  Recent Labs Lab 10/17/16 0510  WBC 6.7  HGB 8.5*  HCT 25.9*  PLT 242   ------------------------------------------------------------------------------------------------------------------ Chemistries   Recent Labs Lab 10/16/16 1853  10/18/16 0431  NA 136  < > 138  K 5.6*  < > 4.9  CL 108  < > 105  CO2 16*  < > 23  GLUCOSE 96  < > 106*  BUN 77*  < > 72*  CREATININE 6.69*  < > 7.25*  CALCIUM 7.7*  < > 7.2*  AST 11*  --   --   ALT 8*  --   --   ALKPHOS 240*  --   --   BILITOT 0.3  --   --   < > = values in this interval not displayed. ------------------------------------------------------------------------------------------------------------------  Cardiac Enzymes  Recent Labs Lab 10/16/16 1853  TROPONINI <0.03   ------------------------------------------------------------------------------------------------------------------  RADIOLOGY:  Ct Abdomen Pelvis Wo Contrast  Result Date: 10/16/2016 CLINICAL DATA:  Initial evaluation for acute bilateral flank and lower back pain extending into groin. EXAM: CT ABDOMEN AND PELVIS WITHOUT CONTRAST TECHNIQUE: Multidetector CT imaging of the abdomen and pelvis was performed following the standard protocol without IV contrast. COMPARISON:  None available. FINDINGS: Lower chest: Mild subsegmental atelectasis seen dependently within the visualized lung bases. Visualized lungs are otherwise  clear. Fat containing Bochdalek's type hernia present at the left lung base. Cardiomegaly with prominent coronary artery calcifications partially visualized. Hepatobiliary: Limited noncontrast evaluation of the liver is unremarkable. Gallbladder within normal limits. No biliary dilatation. Pancreas: Pancreas within normal limits. Spleen: Spleen within normal limits. Adrenals/Urinary Tract: Adrenal glands are normal. Bilateral hydroureteronephrosis is seen, severe on the right, more moderate on the left. No radiopaque calculi seen within either kidney or along the course of either dilated ureter. There is irregular polypoid soft tissue filling the posterior bladder lumen, measuring approximately 4.9 x 2.6 cm (series 2, image 75). Exact measurements difficult given the lack of IV contrast. Finding highly concerning for bladder carcinoma. Prostate is enlarged and intimately apposed to the mass within the bladder lumen. Difficult to discern prostate versus mass on this noncontrast examination. Incidental note made of a 1 cm hyperdense left renal cyst (series 2, image 30), indeterminate, but possibly a complex and/ or hemorrhagic cyst. Stomach/Bowel: Stomach within normal limits. No evidence for bowel obstruction. Appendix normal. Colonic diverticulosis without evidence for acute diverticulitis. Vascular/Lymphatic: Moderate aorto bi-iliac atherosclerotic disease. No aneurysm. Bulky periaortic lymph nodes measure up to 19 mm in short access (series 2, image 38). Enlarged aortocaval nodes measure up to 2.4 cm. Additional adenopathy extends towards the kidneys bilaterally (series 2, image 28 on the right, series 2, image 33 of the left). Right external iliac nodes measuring up to 17 mm (series 2, image 68). Left external nodes measure up to 11 mm (series 2, image 67). Possible small lymph nodes along the internal iliac chains is well, not well evaluated on this noncontrast examination. Other: No free air or fluid.  Musculoskeletal: Innumerable abnormal sclerotic foci seen throughout the visualized spine and pelvis, concerning for osseous metastases. No definite significant epidural tumor appreciated. No associated pathologic fracture. IMPRESSION: 1. Irregular polypoid mass at the posterior aspect of the bladder, highly concerning for bladder carcinoma. The mass is obstructive with secondary moderate to severe bilateral hydroureteronephrosis. Urological consultation recommended. 2. Extensive bulky adenopathy throughout the abdomen and pelvis as above, consistent with nodal metastases. 3. Widespread osseous metastatic disease. No associated pathologic fracture or other complication. Electronically Signed   By: Jeannine Boga M.D.   On: 10/16/2016 20:33   Ir Nephrostomy Placement Left  Result Date: 10/17/2016 INDICATION: Bladder mass causing bilateral distal ureteral obstruction with high-grade bilateral hydronephrosis and acute renal failure. EXAM: IR NEPHROSTOMY PLACEMENT LEFT; IR NEPHROSTOMY PLACEMENT RIGHT COMPARISON:  None. MEDICATIONS: 2 g IV Ancef; The antibiotic was administered in an appropriate time frame prior to skin puncture. ANESTHESIA/SEDATION: Fentanyl 25 mcg IV; Versed 0.5 mg IV Moderate Sedation Time:  30 minutes. The patient was continuously monitored during the procedure by the interventional radiology nurse under my direct supervision. CONTRAST:  45mL ISOVUE-300 IOPAMIDOL (ISOVUE-300) INJECTION 61% - administered into the collecting system(s) FLUOROSCOPY TIME:  Fluoroscopy Time: 1 minute. COMPLICATIONS: None immediate. PROCEDURE: Informed written consent was obtained from the patient's wife after a thorough discussion of the procedural risks, benefits and alternatives. All questions were addressed. Maximal Sterile Barrier Technique was utilized including caps, mask, sterile gowns, sterile gloves, sterile drape, hand hygiene and skin antiseptic. A timeout was performed prior to the initiation of the  procedure. In a prone position, both kidneys were localized by ultrasound. Overlying skin was prepped with chlorhexidine. Access of both kidneys was performed under direct ultrasound guidance with 21  gauge needles and Accustick systems. Ultrasound image documentation was performed. After confirming positioning in the collecting systems with return of urine and contrast injection, guidewires were advanced through the needles. Transitional dilators were placed. Percutaneous tracts were dilated and bilateral 10 French percutaneous nephrostomy tubes advanced over guidewires. Bilateral tubes were formed with positioning confirmed by fluoroscopic spot images obtained after injection of contrast. Both catheters were secured at the skin with Prolene retention sutures and StatLock devices. Both catheters were connected to gravity drainage bags. FINDINGS: Ultrasound confirms severe bilateral hydronephrosis. After placement of bilateral nephrostomy tubes within the renal pelvis bilaterally, there is excellent return of urine from each tube. IMPRESSION: Bilateral percutaneous nephrostomy tube placement with bilateral 10 French catheters placed and formed in the renal pelvis. Both catheters were connected to gravity drainage bags. Electronically Signed   By: [name] M.D.   On: 10/17/2016 17:08   Ir Nephrostomy Placement Right  Result Date: 10/17/2016 INDICATION: Bladder mass causing bilateral distal ureteral obstruction with high-grade bilateral hydronephrosis and acute renal failure. EXAM: IR NEPHROSTOMY PLACEMENT LEFT; IR NEPHROSTOMY PLACEMENT RIGHT COMPARISON:  None. MEDICATIONS: 2 g IV Ancef; The antibiotic was administered in an appropriate time frame prior to skin puncture. ANESTHESIA/SEDATION: Fentanyl 25 mcg IV; Versed 0.5 mg IV Moderate Sedation Time:  30 minutes. The patient was continuously monitored during the procedure by the interventional radiology nurse under my direct supervision. CONTRAST:  71mL  ISOVUE-300 IOPAMIDOL (ISOVUE-300) INJECTION 61% - administered into the collecting system(s) FLUOROSCOPY TIME:  Fluoroscopy Time: 1 minute. COMPLICATIONS: None immediate. PROCEDURE: Informed written consent was obtained from the patient's wife after a thorough discussion of the procedural risks, benefits and alternatives. All questions were addressed. Maximal Sterile Barrier Technique was utilized including caps, mask, sterile gowns, sterile gloves, sterile drape, hand hygiene and skin antiseptic. A timeout was performed prior to the initiation of the procedure. In a prone position, both kidneys were localized by ultrasound. Overlying skin was prepped with chlorhexidine. Access of both kidneys was performed under direct ultrasound guidance with 21 gauge needles and Accustick systems. Ultrasound image documentation was performed. After confirming positioning in the collecting systems with return of urine and contrast injection, guidewires were advanced through the needles. Transitional dilators were placed. Percutaneous tracts were dilated and bilateral 10 French percutaneous nephrostomy tubes advanced over guidewires. Bilateral tubes were formed with positioning confirmed by fluoroscopic spot images obtained after injection of contrast. Both catheters were secured at the skin with Prolene retention sutures and StatLock devices. Both catheters were connected to gravity drainage bags. FINDINGS: Ultrasound confirms severe bilateral hydronephrosis. After placement of bilateral nephrostomy tubes within the renal pelvis bilaterally, there is excellent return of urine from each tube. IMPRESSION: Bilateral percutaneous nephrostomy tube placement with bilateral 10 French catheters placed and formed in the renal pelvis. Both catheters were connected to gravity drainage bags. Electronically Signed   By: [name] M.D.   On: 10/17/2016 17:08     ASSESSMENT AND PLAN:   * Acute renal failure due to bilateral  hydronephrosis with bladder mass Status post bilateral nephrostomy tubes Elevated PSA suggesting prostate cancer. Further management options to be discussed by urology. Also discussed regarding need for dialysis if there is no improvement in acute kidney injury. 1100 ML urine output  * Hyperkalemia due to acute renal failure should improve once nephrostomy tubes are placed. Discussed with Dr. Candiss Norse of nephrology.  * Hypertension Home medications Stopped lisinopril  * Anemia of chronic disease  * DVT prophylaxis with heparin after  his procedure  All the records are reviewed and case discussed with Care Management/Social Workerr. Management plans discussed with the patient, family and they are in agreement.  CODE STATUS: DNR  DVT Prophylaxis: SCDs  TOTAL TIME TAKING CARE OF THIS PATIENT: 35 minutes.   POSSIBLE D/C IN 2-3 DAYS, DEPENDING ON CLINICAL CONDITION.  Hillary Bow R M.D on 10/18/2016 at 9:41 AM  Between 7am to 6pm - Pager - [phone]  After 6pm go to [email or web address] - password [email or web address] [facility]  Office  [phone]  CC: Primary care physician; No PCP Per Patient  Note: This dictation was prepared with Dragon dictation along with smaller phrase technology. Any transcriptional errors that result from this process are unintentional.

## 2016-10-18 NOTE — Progress Notes (Signed)
Urology Consult Follow Up  Subjective: Severe left hip pain.  Bilateral nephrostomy tubes in place draining well.  Cr rising despite tubes.    Anti-infectives: Anti-infectives    Start     Dose/Rate Route Frequency Ordered Stop   10/17/16 1400  ceFAZolin (ANCEF) 1 GM/50ML IVPB    Comments:  Sharen Heck: cabinet override      10/17/16 1400 10/18/16 0159   10/17/16 1400  ceFAZolin (ANCEF) 1 GM/50ML IVPB    Comments:  Sharen Heck: cabinet override      10/17/16 1400 10/18/16 0159   10/17/16 1030  ceFAZolin (ANCEF) IVPB 2g/100 mL premix     2 g 200 mL/hr over 30 Minutes Intravenous To Radiology 10/17/16 0903 10/17/16 1450      Current Facility-Administered Medications  Medication Dose Route Frequency Provider Last Rate Last Dose  . 0.9 %  sodium chloride infusion   Intravenous Continuous Srikar Sudini, MD 100 mL/hr at 10/18/16 [id number]    . acetaminophen (TYLENOL) tablet 650 mg  650 mg Oral Q6H PRN Lance Coon, MD       Or  . acetaminophen (TYLENOL) suppository 650 mg  650 mg Rectal Q6H PRN Lance Coon, MD      . heparin injection 5,000 Units  5,000 Units Subcutaneous Q8H Saverio Danker, PA-C   5,000 Units at 10/18/16 1254  . HYDROcodone-acetaminophen (NORCO/VICODIN) 5-325 MG per tablet 1 tablet  1 tablet Oral Q6H PRN Srikar Sudini, MD      . HYDROmorphone (DILAUDID) injection 0.5 mg  0.5 mg Intravenous Q4H PRN Lance Coon, MD   0.5 mg at 10/18/16 1254  . metoprolol (LOPRESSOR) tablet 50 mg  50 mg Oral BID Lance Coon, MD   50 mg at 10/18/16 [id number]  . nitroGLYCERIN (NITROSTAT) SL tablet 0.4 mg  0.4 mg Sublingual Q5 min PRN Srikar Sudini, MD      . ondansetron [facility]) tablet 4 mg  4 mg Oral Q6H PRN Lance Coon, MD       Or  . ondansetron [facility]) injection 4 mg  4 mg Intravenous Q6H PRN Lance Coon, MD   4 mg at 10/18/16 0948  . promethazine (PHENERGAN) injection 12.5 mg  12.5 mg Intravenous Q6H PRN Lance Coon, MD   12.5 mg at 10/18/16 1254     Objective: Vital signs  in last 24 hours: Temp:  [97.4 F (36.3 C)-100.8 F (38.2 C)] 100.8 F (38.2 C) (12/18 1412) Pulse Rate:  [85-101] 88 (12/18 1412) Resp:  [14-20] 18 (12/18 1412) BP: (84-134)/(54-82) 107/54 (12/18 1412) SpO2:  [90 %-99 %] 90 % (12/18 1412) Weight:  [184 lb 9.6 oz (83.7 kg)] 184 lb 9.6 oz (83.7 kg) (12/18 0500)  Intake/Output from previous day: 12/17 0701 - 12/18 0700 In: 2322 [I.V.:1322] Out: 1025 [Urine:1025] Intake/Output this shift: Total I/O In: 260 [P.O.:240; Other:20] Out: [id number]   Physical Exam  Constitutional: He is oriented to person, place, and time and well-developed, well-nourished, and in no distress.  Daughter at bedside.  HENT:  Head: Normocephalic and atraumatic.  Neck: Normal range of motion. Neck supple.  Cardiovascular: Normal rate.   Abdominal: Soft.  Genitourinary:  Genitourinary Comments: Normal sphincter tone. Grossly abnormal prostate, enlarged, multiple firm nodules diffusely throughout.  Nephrostomy tubes draining well, left pink tinged, right clear.  Neurological: He is alert and oriented to person, place, and time.    Lab Results:   Recent Labs  10/16/16 1853 10/17/16 0510  WBC 4.9 6.7  HGB 7.9* 8.5*  HCT 23.6* 25.9*  PLT 239 242   BMET  Recent Labs  10/17/16 0510 10/18/16 0431  NA 137 138  K 6.0* 4.9  CL 107 105  CO2 21* 23  GLUCOSE 112* 106*  BUN 72* 72*  CREATININE 6.73* 7.25*  CALCIUM 7.9* 7.2*   PT/INR  Recent Labs  10/16/16 1853  LABPROT 13.3  INR 1.01   ABG No results for input(s): PHART, HCO3 in the last 72 hours.  Invalid input(s): PCO2, PO2  Studies/Results: Ct Abdomen Pelvis Wo Contrast  Result Date: 10/16/2016 CLINICAL DATA:  Initial evaluation for acute bilateral flank and lower back pain extending into groin. EXAM: CT ABDOMEN AND PELVIS WITHOUT CONTRAST TECHNIQUE: Multidetector CT imaging of the abdomen and pelvis was performed following the standard protocol without IV contrast.  COMPARISON:  None available. FINDINGS: Lower chest: Mild subsegmental atelectasis seen dependently within the visualized lung bases. Visualized lungs are otherwise clear. Fat containing Bochdalek's type hernia present at the left lung base. Cardiomegaly with prominent coronary artery calcifications partially visualized. Hepatobiliary: Limited noncontrast evaluation of the liver is unremarkable. Gallbladder within normal limits. No biliary dilatation. Pancreas: Pancreas within normal limits. Spleen: Spleen within normal limits. Adrenals/Urinary Tract: Adrenal glands are normal. Bilateral hydroureteronephrosis is seen, severe on the right, more moderate on the left. No radiopaque calculi seen within either kidney or along the course of either dilated ureter. There is irregular polypoid soft tissue filling the posterior bladder lumen, measuring approximately 4.9 x 2.6 cm (series 2, image 75). Exact measurements difficult given the lack of IV contrast. Finding highly concerning for bladder carcinoma. Prostate is enlarged and intimately apposed to the mass within the bladder lumen. Difficult to discern prostate versus mass on this noncontrast examination. Incidental note made of a 1 cm hyperdense left renal cyst (series 2, image 30), indeterminate, but possibly a complex and/ or hemorrhagic cyst. Stomach/Bowel: Stomach within normal limits. No evidence for bowel obstruction. Appendix normal. Colonic diverticulosis without evidence for acute diverticulitis. Vascular/Lymphatic: Moderate aorto bi-iliac atherosclerotic disease. No aneurysm. Bulky periaortic lymph nodes measure up to 19 mm in short access (series 2, image 38). Enlarged aortocaval nodes measure up to 2.4 cm. Additional adenopathy extends towards the kidneys bilaterally (series 2, image 28 on the right, series 2, image 33 of the left). Right external iliac nodes measuring up to 17 mm (series 2, image 68). Left external nodes measure up to 11 mm (series 2, image  67). Possible small lymph nodes along the internal iliac chains is well, not well evaluated on this noncontrast examination. Other: No free air or fluid. Musculoskeletal: Innumerable abnormal sclerotic foci seen throughout the visualized spine and pelvis, concerning for osseous metastases. No definite significant epidural tumor appreciated. No associated pathologic fracture. IMPRESSION: 1. Irregular polypoid mass at the posterior aspect of the bladder, highly concerning for bladder carcinoma. The mass is obstructive with secondary moderate to severe bilateral hydroureteronephrosis. Urological consultation recommended. 2. Extensive bulky adenopathy throughout the abdomen and pelvis as above, consistent with nodal metastases. 3. Widespread osseous metastatic disease. No associated pathologic fracture or other complication. Electronically Signed   By: Jeannine Boga M.D.   On: 10/16/2016 20:33   Ir Nephrostomy Placement Left  Result Date: 10/17/2016 INDICATION: Bladder mass causing bilateral distal ureteral obstruction with high-grade bilateral hydronephrosis and acute renal failure. EXAM: IR NEPHROSTOMY PLACEMENT LEFT; IR NEPHROSTOMY PLACEMENT RIGHT COMPARISON:  None. MEDICATIONS: 2 g IV Ancef; The antibiotic was administered in an appropriate time frame prior to skin puncture. ANESTHESIA/SEDATION: Fentanyl 25 mcg IV; Versed 0.5  mg IV Moderate Sedation Time:  30 minutes. The patient was continuously monitored during the procedure by the interventional radiology nurse under my direct supervision. CONTRAST:  51mL ISOVUE-300 IOPAMIDOL (ISOVUE-300) INJECTION 61% - administered into the collecting system(s) FLUOROSCOPY TIME:  Fluoroscopy Time: 1 minute. COMPLICATIONS: None immediate. PROCEDURE: Informed written consent was obtained from the patient's wife after a thorough discussion of the procedural risks, benefits and alternatives. All questions were addressed. Maximal Sterile Barrier Technique was utilized  including caps, mask, sterile gowns, sterile gloves, sterile drape, hand hygiene and skin antiseptic. A timeout was performed prior to the initiation of the procedure. In a prone position, both kidneys were localized by ultrasound. Overlying skin was prepped with chlorhexidine. Access of both kidneys was performed under direct ultrasound guidance with 21 gauge needles and Accustick systems. Ultrasound image documentation was performed. After confirming positioning in the collecting systems with return of urine and contrast injection, guidewires were advanced through the needles. Transitional dilators were placed. Percutaneous tracts were dilated and bilateral 10 French percutaneous nephrostomy tubes advanced over guidewires. Bilateral tubes were formed with positioning confirmed by fluoroscopic spot images obtained after injection of contrast. Both catheters were secured at the skin with Prolene retention sutures and StatLock devices. Both catheters were connected to gravity drainage bags. FINDINGS: Ultrasound confirms severe bilateral hydronephrosis. After placement of bilateral nephrostomy tubes within the renal pelvis bilaterally, there is excellent return of urine from each tube. IMPRESSION: Bilateral percutaneous nephrostomy tube placement with bilateral 10 French catheters placed and formed in the renal pelvis. Both catheters were connected to gravity drainage bags. Electronically Signed   By: [name] M.D.   On: 10/17/2016 17:08   Ir Nephrostomy Placement Right  Result Date: 10/17/2016 INDICATION: Bladder mass causing bilateral distal ureteral obstruction with high-grade bilateral hydronephrosis and acute renal failure. EXAM: IR NEPHROSTOMY PLACEMENT LEFT; IR NEPHROSTOMY PLACEMENT RIGHT COMPARISON:  None. MEDICATIONS: 2 g IV Ancef; The antibiotic was administered in an appropriate time frame prior to skin puncture. ANESTHESIA/SEDATION: Fentanyl 25 mcg IV; Versed 0.5 mg IV Moderate Sedation Time:   30 minutes. The patient was continuously monitored during the procedure by the interventional radiology nurse under my direct supervision. CONTRAST:  39mL ISOVUE-300 IOPAMIDOL (ISOVUE-300) INJECTION 61% - administered into the collecting system(s) FLUOROSCOPY TIME:  Fluoroscopy Time: 1 minute. COMPLICATIONS: None immediate. PROCEDURE: Informed written consent was obtained from the patient's wife after a thorough discussion of the procedural risks, benefits and alternatives. All questions were addressed. Maximal Sterile Barrier Technique was utilized including caps, mask, sterile gowns, sterile gloves, sterile drape, hand hygiene and skin antiseptic. A timeout was performed prior to the initiation of the procedure. In a prone position, both kidneys were localized by ultrasound. Overlying skin was prepped with chlorhexidine. Access of both kidneys was performed under direct ultrasound guidance with 21 gauge needles and Accustick systems. Ultrasound image documentation was performed. After confirming positioning in the collecting systems with return of urine and contrast injection, guidewires were advanced through the needles. Transitional dilators were placed. Percutaneous tracts were dilated and bilateral 10 French percutaneous nephrostomy tubes advanced over guidewires. Bilateral tubes were formed with positioning confirmed by fluoroscopic spot images obtained after injection of contrast. Both catheters were secured at the skin with Prolene retention sutures and StatLock devices. Both catheters were connected to gravity drainage bags. FINDINGS: Ultrasound confirms severe bilateral hydronephrosis. After placement of bilateral nephrostomy tubes within the renal pelvis bilaterally, there is excellent return of urine from each tube. IMPRESSION: Bilateral percutaneous nephrostomy tube  placement with bilateral 10 French catheters placed and formed in the renal pelvis. Both catheters were connected to gravity drainage  bags. Electronically Signed   By: [name] M.D.   On: 10/17/2016 17:08     Assessment/ Plan: 81 year old male with metastatic locally invasive clinically diagnosed prostate cancer, PSA 469 with grossly abnormal rectal exam, nodular prostate.    1) metastatic prostate cancer- diagnosis discussed today with the patient and his daughter.  Given that the diagnosis is clear, I see no need for prostate biopsy. Insurance investigation underway for administration of the Degarelix.  This medication would be beneficial over lupron given concern for flair.  Side effects of the medication were reviewed today with the patient including hot flashes, loss of muscle mass, osteoporosis, weight gain, and cardiac from from long-term ADT.  Ideally, this medication could be started during this admission.    2) acute renal failure- status post bilateral nephrostomy tube placement yesterday, draining well.  We'll continue to follow closely. If creatinine fails to improve tomorrow, renal failure is likely multifactorial and not just obstructive in nature.  Nephology following.  3) severe left hip pain- secondary to metastatic bony lesion. Radiation Oncology consult placed.  Dr. Baruch Gouty see the patient on Thursday if he still in house for discussion of possible purulent of radiation to the left hip.    LOS: 2 days    Hollice Espy 10/18/2016

## 2016-10-18 NOTE — Progress Notes (Signed)
[facility]  ROUNDING NOTE   Subjective:  Patient seen at bedside. Nephrostomies do appear to be draining. Urine output over the preceding 24 hours was 1.1 L. Creatinine has risen a bit to 7.25.   Objective:  Vital signs in last 24 hours:  Temp:  [97.4 F (36.3 C)-100.8 F (38.2 C)] 100.8 F (38.2 C) (12/18 1412) Pulse Rate:  [85-109] 88 (12/18 1412) Resp:  [14-22] 18 (12/18 1412) BP: (84-141)/(54-90) 107/54 (12/18 1412) SpO2:  [90 %-100 %] 90 % (12/18 1412) Weight:  [83.7 kg (184 lb 9.6 oz)] 83.7 kg (184 lb 9.6 oz) (12/18 0500)  Weight change: 9.435 kg (20 lb 12.8 oz) Filed Weights   10/16/16 2355 10/17/16 0828 10/18/16 0500  Weight: 80.7 kg (178 lb) 82 kg (180 lb 12.8 oz) 83.7 kg (184 lb 9.6 oz)    Intake/Output: I/O last 3 completed shifts: In: [id number] [I.V.:1452; Other:1000; IV Piggyback:1000] Out: [id number] [Urine:1300; Emesis/NG output:350]   Intake/Output this shift:  Total I/O In: 260 [P.O.:240; Other:20] Out: [id number]  Physical Exam: General: No acute distress  Head: Normocephalic, atraumatic. Moist oral mucosal membranes  Eyes: Anicteric  Neck: Supple, trachea midline  Lungs:  Clear to auscultation, normal effort  Heart: S1S2 no rubs  Abdomen:  Soft, nontender, bowel sounds present  Extremities: Trace peripheral edema.  Neurologic: Nonfocal, moving all four extremities  Skin: No lesions       Basic Metabolic Panel:  Recent Labs Lab 10/16/16 1853 10/17/16 0510 10/18/16 0431  NA 136 137 138  K 5.6* 6.0* 4.9  CL 108 107 105  CO2 16* 21* 23  GLUCOSE 96 112* 106*  BUN 77* 72* 72*  CREATININE 6.69* 6.73* 7.25*  CALCIUM 7.7* 7.9* 7.2*    Liver Function Tests:  Recent Labs Lab 10/16/16 1853  AST 11*  ALT 8*  ALKPHOS 240*  BILITOT 0.3  PROT 6.9  ALBUMIN 3.5    Recent Labs Lab 10/16/16 1853  LIPASE 29   No results for input(s): AMMONIA in the last 168 hours.  CBC:  Recent Labs Lab 10/16/16 1853 10/17/16 0510   WBC 4.9 6.7  NEUTROABS 3.0  --   HGB 7.9* 8.5*  HCT 23.6* 25.9*  MCV 89.3 89.9  PLT 239 242    Cardiac Enzymes:  Recent Labs Lab 10/16/16 1853  TROPONINI <0.03    BNP: Invalid input(s): POCBNP  CBG: No results for input(s): GLUCAP in the last 168 hours.  Microbiology: No results found for this or any previous visit.  Coagulation Studies:  Recent Labs  10/16/16 1853  LABPROT 13.3  INR 1.01    Urinalysis:  Recent Labs  10/16/16 1700  COLORURINE STRAW*  LABSPEC 1.009  PHURINE 5.0  GLUCOSEU NEGATIVE  HGBUR MODERATE*  BILIRUBINUR NEGATIVE  KETONESUR NEGATIVE  PROTEINUR 100*  NITRITE NEGATIVE  LEUKOCYTESUR NEGATIVE      Imaging: Ct Abdomen Pelvis Wo Contrast  Result Date: 10/16/2016 CLINICAL DATA:  Initial evaluation for acute bilateral flank and lower back pain extending into groin. EXAM: CT ABDOMEN AND PELVIS WITHOUT CONTRAST TECHNIQUE: Multidetector CT imaging of the abdomen and pelvis was performed following the standard protocol without IV contrast. COMPARISON:  None available. FINDINGS: Lower chest: Mild subsegmental atelectasis seen dependently within the visualized lung bases. Visualized lungs are otherwise clear. Fat containing Bochdalek's type hernia present at the left lung base. Cardiomegaly with prominent coronary artery calcifications partially visualized. Hepatobiliary: Limited noncontrast evaluation of the liver is unremarkable. Gallbladder within normal limits. No biliary dilatation. Pancreas:  Pancreas within normal limits. Spleen: Spleen within normal limits. Adrenals/Urinary Tract: Adrenal glands are normal. Bilateral hydroureteronephrosis is seen, severe on the right, more moderate on the left. No radiopaque calculi seen within either kidney or along the course of either dilated ureter. There is irregular polypoid soft tissue filling the posterior bladder lumen, measuring approximately 4.9 x 2.6 cm (series 2, image 75). Exact measurements  difficult given the lack of IV contrast. Finding highly concerning for bladder carcinoma. Prostate is enlarged and intimately apposed to the mass within the bladder lumen. Difficult to discern prostate versus mass on this noncontrast examination. Incidental note made of a 1 cm hyperdense left renal cyst (series 2, image 30), indeterminate, but possibly a complex and/ or hemorrhagic cyst. Stomach/Bowel: Stomach within normal limits. No evidence for bowel obstruction. Appendix normal. Colonic diverticulosis without evidence for acute diverticulitis. Vascular/Lymphatic: Moderate aorto bi-iliac atherosclerotic disease. No aneurysm. Bulky periaortic lymph nodes measure up to 19 mm in short access (series 2, image 38). Enlarged aortocaval nodes measure up to 2.4 cm. Additional adenopathy extends towards the kidneys bilaterally (series 2, image 28 on the right, series 2, image 33 of the left). Right external iliac nodes measuring up to 17 mm (series 2, image 68). Left external nodes measure up to 11 mm (series 2, image 67). Possible small lymph nodes along the internal iliac chains is well, not well evaluated on this noncontrast examination. Other: No free air or fluid. Musculoskeletal: Innumerable abnormal sclerotic foci seen throughout the visualized spine and pelvis, concerning for osseous metastases. No definite significant epidural tumor appreciated. No associated pathologic fracture. IMPRESSION: 1. Irregular polypoid mass at the posterior aspect of the bladder, highly concerning for bladder carcinoma. The mass is obstructive with secondary moderate to severe bilateral hydroureteronephrosis. Urological consultation recommended. 2. Extensive bulky adenopathy throughout the abdomen and pelvis as above, consistent with nodal metastases. 3. Widespread osseous metastatic disease. No associated pathologic fracture or other complication. Electronically Signed   By: Jeannine Boga M.D.   On: 10/16/2016 20:33   Ir  Nephrostomy Placement Left  Result Date: 10/17/2016 INDICATION: Bladder mass causing bilateral distal ureteral obstruction with high-grade bilateral hydronephrosis and acute renal failure. EXAM: IR NEPHROSTOMY PLACEMENT LEFT; IR NEPHROSTOMY PLACEMENT RIGHT COMPARISON:  None. MEDICATIONS: 2 g IV Ancef; The antibiotic was administered in an appropriate time frame prior to skin puncture. ANESTHESIA/SEDATION: Fentanyl 25 mcg IV; Versed 0.5 mg IV Moderate Sedation Time:  30 minutes. The patient was continuously monitored during the procedure by the interventional radiology nurse under my direct supervision. CONTRAST:  61mL ISOVUE-300 IOPAMIDOL (ISOVUE-300) INJECTION 61% - administered into the collecting system(s) FLUOROSCOPY TIME:  Fluoroscopy Time: 1 minute. COMPLICATIONS: None immediate. PROCEDURE: Informed written consent was obtained from the patient's wife after a thorough discussion of the procedural risks, benefits and alternatives. All questions were addressed. Maximal Sterile Barrier Technique was utilized including caps, mask, sterile gowns, sterile gloves, sterile drape, hand hygiene and skin antiseptic. A timeout was performed prior to the initiation of the procedure. In a prone position, both kidneys were localized by ultrasound. Overlying skin was prepped with chlorhexidine. Access of both kidneys was performed under direct ultrasound guidance with 21 gauge needles and Accustick systems. Ultrasound image documentation was performed. After confirming positioning in the collecting systems with return of urine and contrast injection, guidewires were advanced through the needles. Transitional dilators were placed. Percutaneous tracts were dilated and bilateral 10 French percutaneous nephrostomy tubes advanced over guidewires. Bilateral tubes were formed with positioning confirmed by fluoroscopic  spot images obtained after injection of contrast. Both catheters were secured at the skin with Prolene retention  sutures and StatLock devices. Both catheters were connected to gravity drainage bags. FINDINGS: Ultrasound confirms severe bilateral hydronephrosis. After placement of bilateral nephrostomy tubes within the renal pelvis bilaterally, there is excellent return of urine from each tube. IMPRESSION: Bilateral percutaneous nephrostomy tube placement with bilateral 10 French catheters placed and formed in the renal pelvis. Both catheters were connected to gravity drainage bags. Electronically Signed   By: [name] M.D.   On: 10/17/2016 17:08   Ir Nephrostomy Placement Right  Result Date: 10/17/2016 INDICATION: Bladder mass causing bilateral distal ureteral obstruction with high-grade bilateral hydronephrosis and acute renal failure. EXAM: IR NEPHROSTOMY PLACEMENT LEFT; IR NEPHROSTOMY PLACEMENT RIGHT COMPARISON:  None. MEDICATIONS: 2 g IV Ancef; The antibiotic was administered in an appropriate time frame prior to skin puncture. ANESTHESIA/SEDATION: Fentanyl 25 mcg IV; Versed 0.5 mg IV Moderate Sedation Time:  30 minutes. The patient was continuously monitored during the procedure by the interventional radiology nurse under my direct supervision. CONTRAST:  26mL ISOVUE-300 IOPAMIDOL (ISOVUE-300) INJECTION 61% - administered into the collecting system(s) FLUOROSCOPY TIME:  Fluoroscopy Time: 1 minute. COMPLICATIONS: None immediate. PROCEDURE: Informed written consent was obtained from the patient's wife after a thorough discussion of the procedural risks, benefits and alternatives. All questions were addressed. Maximal Sterile Barrier Technique was utilized including caps, mask, sterile gowns, sterile gloves, sterile drape, hand hygiene and skin antiseptic. A timeout was performed prior to the initiation of the procedure. In a prone position, both kidneys were localized by ultrasound. Overlying skin was prepped with chlorhexidine. Access of both kidneys was performed under direct ultrasound guidance with 21 gauge  needles and Accustick systems. Ultrasound image documentation was performed. After confirming positioning in the collecting systems with return of urine and contrast injection, guidewires were advanced through the needles. Transitional dilators were placed. Percutaneous tracts were dilated and bilateral 10 French percutaneous nephrostomy tubes advanced over guidewires. Bilateral tubes were formed with positioning confirmed by fluoroscopic spot images obtained after injection of contrast. Both catheters were secured at the skin with Prolene retention sutures and StatLock devices. Both catheters were connected to gravity drainage bags. FINDINGS: Ultrasound confirms severe bilateral hydronephrosis. After placement of bilateral nephrostomy tubes within the renal pelvis bilaterally, there is excellent return of urine from each tube. IMPRESSION: Bilateral percutaneous nephrostomy tube placement with bilateral 10 French catheters placed and formed in the renal pelvis. Both catheters were connected to gravity drainage bags. Electronically Signed   By: [name] M.D.   On: 10/17/2016 17:08     Medications:   . sodium chloride 100 mL/hr at 10/18/16 [id number]   . heparin  5,000 Units Subcutaneous Q8H  . metoprolol  50 mg Oral BID   acetaminophen **OR** acetaminophen, HYDROcodone-acetaminophen, HYDROmorphone (DILAUDID) injection, nitroGLYCERIN, ondansetron **OR** ondansetron (ZOFRAN) IV, promethazine  Assessment/ Plan:  81 y.o. male with medical problems of coronary disease MI in 2014, angioplasty with stent placement, hypertension, enlarged prostate, who was admitted to [facility] on 10/16/2016 for evaluation of acute onset low back pain. Upon presentation, his creatinine was found to be 6.69.  Only known prior creatinine is 0.94 from January 2014.   1.  Acute renal failure due to obstuctive uropathy. 2.  Bilateral hydronephrosis. 3.  Hyperkalemia. 4.  Anemia not otherwise specified. 5.  Metabolic acidosis,  improved 6.  Suspected prostate cancer.  Plan:  Renal function has not improved as far.  Creatinine currently 7.25.  Unclear as to how long obstruction has been present but has likely been present for months.  I advised the patient and his family that we may need to consider renal replacement therapy if renal function does not improve with bilateral nephrostomy placement.  We will continue to monitor renal function trend daily.  Metabolic acidosis has improved with most recent serum bicarbonate of 23.  Awaiting further urology input regarding the timing of prostate biopsy.  LOS: 2 Canisha Issac 12/18/20172:59 PM

## 2016-10-19 LAB — BASIC METABOLIC PANEL
Anion gap: 9 (ref 5–15)
BUN: 58 mg/dL — AB (ref 6–20)
CALCIUM: 6.3 mg/dL — AB (ref 8.9–10.3)
CO2: 24 mmol/L (ref 22–32)
CREATININE: 6.06 mg/dL — AB (ref 0.61–1.24)
Chloride: 103 mmol/L (ref 101–111)
GFR calc non Af Amer: 8 mL/min — ABNORMAL LOW (ref >60)
GFR, EST AFRICAN AMERICAN: 9 mL/min — AB (ref >60)
GLUCOSE: 126 mg/dL — AB (ref 65–99)
Potassium: 4.3 mmol/L (ref 3.5–5.1)
Sodium: 136 mmol/L (ref 135–145)

## 2016-10-19 LAB — CBC WITH DIFFERENTIAL/PLATELET
BASOS PCT: 1 %
Basophils Absolute: 0 10*3/uL (ref 0–0.1)
Eosinophils Absolute: 0 10*3/uL (ref 0–0.7)
Eosinophils Relative: 0 %
HEMATOCRIT: 24.4 % — AB (ref 40.0–52.0)
Hemoglobin: 8.1 g/dL — ABNORMAL LOW (ref 13.0–18.0)
Lymphocytes Relative: 12 %
Lymphs Abs: 0.8 10*3/uL — ABNORMAL LOW (ref 1.0–3.6)
MCH: 30 pg (ref 26.0–34.0)
MCHC: 33.2 g/dL (ref 32.0–36.0)
MCV: 90.6 fL (ref 80.0–100.0)
MONO ABS: 0.7 10*3/uL (ref 0.2–1.0)
MONOS PCT: 10 %
Neutro Abs: 5.2 10*3/uL (ref 1.4–6.5)
Neutrophils Relative %: 77 %
Platelets: 184 10*3/uL (ref 150–440)
RBC: 2.69 MIL/uL — ABNORMAL LOW (ref 4.40–5.90)
RDW: 14.6 % — AB (ref 11.5–14.5)
WBC: 6.8 10*3/uL (ref 3.8–10.6)

## 2016-10-19 LAB — ALBUMIN: Albumin: 2.8 g/dL — ABNORMAL LOW (ref 3.5–5.0)

## 2016-10-19 MED ORDER — SODIUM CHLORIDE 0.9 % IV SOLN
1.0000 g | Freq: Once | INTRAVENOUS | Status: AC
  Administered 2016-10-19: 16:00:00 1 g via INTRAVENOUS
  Filled 2016-10-19: qty 10

## 2016-10-19 MED ORDER — POLYETHYLENE GLYCOL 3350 17 G PO PACK
17.0000 g | PACK | Freq: Every day | ORAL | Status: DC | PRN
  Administered 2016-10-19: 17 g via ORAL
  Filled 2016-10-19: qty 1

## 2016-10-19 NOTE — Progress Notes (Signed)
Urology Consult Follow Up  Subjective: Patient without complaints this morning.  Questions concerning degarelix are answered.  (Wife also had questions and called the office and her questions were answered).  UOP is good, draining pink tinged urine from the nephrostomy tubes.  Creatinine somewhat improved.  VSS afebrile.    Anti-infectives: Anti-infectives    Start     Dose/Rate Route Frequency Ordered Stop   10/17/16 1400  ceFAZolin (ANCEF) 1 GM/50ML IVPB    Comments:  Sharen Heck: cabinet override      10/17/16 1400 10/18/16 0159   10/17/16 1400  ceFAZolin (ANCEF) 1 GM/50ML IVPB    Comments:  Sharen Heck: cabinet override      10/17/16 1400 10/18/16 0159   10/17/16 1030  ceFAZolin (ANCEF) IVPB 2g/100 mL premix     2 g 200 mL/hr over 30 Minutes Intravenous To Radiology 10/17/16 0903 10/17/16 1450      Current Facility-Administered Medications  Medication Dose Route Frequency Provider Last Rate Last Dose  . 0.9 %  sodium chloride infusion   Intravenous Continuous Hillary Bow, MD 100 mL/hr at 10/19/16 0148    . acetaminophen (TYLENOL) tablet 650 mg  650 mg Oral Q6H PRN Lance Coon, MD   650 mg at 10/18/16 1621   Or  . acetaminophen (TYLENOL) suppository 650 mg  650 mg Rectal Q6H PRN Lance Coon, MD      . heparin injection 5,000 Units  5,000 Units Subcutaneous Q8H Saverio Danker, PA-C   5,000 Units at 10/19/16 0654  . HYDROcodone-acetaminophen (NORCO/VICODIN) 5-325 MG per tablet 1 tablet  1 tablet Oral Q6H PRN Hillary Bow, MD   1 tablet at 10/18/16 1621  . HYDROmorphone (DILAUDID) injection 0.5 mg  0.5 mg Intravenous Q4H PRN Lance Coon, MD   0.5 mg at 10/18/16 2055  . metoprolol (LOPRESSOR) tablet 50 mg  50 mg Oral BID Lance Coon, MD   50 mg at 10/18/16 2325  . nitroGLYCERIN (NITROSTAT) SL tablet 0.4 mg  0.4 mg Sublingual Q5 min PRN Hillary Bow, MD      . ondansetron [facility]) tablet 4 mg  4 mg Oral Q6H PRN Lance Coon, MD       Or  . ondansetron [facility])  injection 4 mg  4 mg Intravenous Q6H PRN Lance Coon, MD   4 mg at 10/19/16 [phone]  . promethazine (PHENERGAN) injection 12.5 mg  12.5 mg Intravenous Q6H PRN Lance Coon, MD   12.5 mg at 10/18/16 1254     Objective: Vital signs in last 24 hours: Temp:  [98.4 F (36.9 C)-100.8 F (38.2 C)] 98.5 F (36.9 C) (12/19 0439) Pulse Rate:  [84-89] 84 (12/19 0439) Resp:  [17-18] 17 (12/19 0439) BP: (107-119)/(54-58) 119/54 (12/19 0439) SpO2:  [90 %-95 %] 95 % (12/19 0439) Weight:  [188 lb 11.2 oz (85.6 kg)] 188 lb 11.2 oz (85.6 kg) (12/19 0500)  Intake/Output from previous day: 12/18 0701 - 12/19 0700 In: 2408 [P.O.:240; I.V.:2128] Out: 4450 [Urine:4450] Intake/Output this shift: No intake/output data recorded.   Physical Exam Constitutional: Well nourished. Alert and oriented, No acute distress. HEENT: [place] AT, moist mucus membranes. Trachea midline, no masses. Cardiovascular: No clubbing, cyanosis, or edema. Respiratory: Normal respiratory effort, no increased work of breathing. GI: Abdomen is soft, non tender, non distended, no abdominal masses. Liver and spleen not palpable.  No hernias appreciated.  Stool sample for occult testing is not indicated.   GU: No CVA tenderness.  No bladder fullness or masses.   PCN dressings are  clean and dry, draining clear, dark pink urine.   Skin: No rashes, bruises or suspicious lesions. Lymph: No cervical or inguinal adenopathy. Neurologic: Grossly intact, no focal deficits, moving all 4 extremities. Psychiatric: Normal mood and affect.  Lab Results:   Recent Labs  10/17/16 0510 10/19/16 0427  WBC 6.7 6.8  HGB 8.5* 8.1*  HCT 25.9* 24.4*  PLT 242 184   BMET  Recent Labs  10/18/16 0431 10/19/16 0427  NA 138 136  K 4.9 4.3  CL 105 103  CO2 23 24  GLUCOSE 106* 126*  BUN 72* 58*  CREATININE 7.25* 6.06*  CALCIUM 7.2* 6.3*   PT/INR  Recent Labs  10/16/16 1853  LABPROT 13.3  INR 1.01   ABG No results for input(s): PHART,  HCO3 in the last 72 hours.  Invalid input(s): PCO2, PO2  Studies/Results: Ir Nephrostomy Placement Left  Result Date: 10/17/2016 INDICATION: Bladder mass causing bilateral distal ureteral obstruction with high-grade bilateral hydronephrosis and acute renal failure. EXAM: IR NEPHROSTOMY PLACEMENT LEFT; IR NEPHROSTOMY PLACEMENT RIGHT COMPARISON:  None. MEDICATIONS: 2 g IV Ancef; The antibiotic was administered in an appropriate time frame prior to skin puncture. ANESTHESIA/SEDATION: Fentanyl 25 mcg IV; Versed 0.5 mg IV Moderate Sedation Time:  30 minutes. The patient was continuously monitored during the procedure by the interventional radiology nurse under my direct supervision. CONTRAST:  15mL ISOVUE-300 IOPAMIDOL (ISOVUE-300) INJECTION 61% - administered into the collecting system(s) FLUOROSCOPY TIME:  Fluoroscopy Time: 1 minute. COMPLICATIONS: None immediate. PROCEDURE: Informed written consent was obtained from the patient's wife after a thorough discussion of the procedural risks, benefits and alternatives. All questions were addressed. Maximal Sterile Barrier Technique was utilized including caps, mask, sterile gowns, sterile gloves, sterile drape, hand hygiene and skin antiseptic. A timeout was performed prior to the initiation of the procedure. In a prone position, both kidneys were localized by ultrasound. Overlying skin was prepped with chlorhexidine. Access of both kidneys was performed under direct ultrasound guidance with 21 gauge needles and Accustick systems. Ultrasound image documentation was performed. After confirming positioning in the collecting systems with return of urine and contrast injection, guidewires were advanced through the needles. Transitional dilators were placed. Percutaneous tracts were dilated and bilateral 10 French percutaneous nephrostomy tubes advanced over guidewires. Bilateral tubes were formed with positioning confirmed by fluoroscopic spot images obtained after  injection of contrast. Both catheters were secured at the skin with Prolene retention sutures and StatLock devices. Both catheters were connected to gravity drainage bags. FINDINGS: Ultrasound confirms severe bilateral hydronephrosis. After placement of bilateral nephrostomy tubes within the renal pelvis bilaterally, there is excellent return of urine from each tube. IMPRESSION: Bilateral percutaneous nephrostomy tube placement with bilateral 10 French catheters placed and formed in the renal pelvis. Both catheters were connected to gravity drainage bags. Electronically Signed   By: [name] M.D.   On: 10/17/2016 17:08   Ir Nephrostomy Placement Right  Result Date: 10/17/2016 INDICATION: Bladder mass causing bilateral distal ureteral obstruction with high-grade bilateral hydronephrosis and acute renal failure. EXAM: IR NEPHROSTOMY PLACEMENT LEFT; IR NEPHROSTOMY PLACEMENT RIGHT COMPARISON:  None. MEDICATIONS: 2 g IV Ancef; The antibiotic was administered in an appropriate time frame prior to skin puncture. ANESTHESIA/SEDATION: Fentanyl 25 mcg IV; Versed 0.5 mg IV Moderate Sedation Time:  30 minutes. The patient was continuously monitored during the procedure by the interventional radiology nurse under my direct supervision. CONTRAST:  70mL ISOVUE-300 IOPAMIDOL (ISOVUE-300) INJECTION 61% - administered into the collecting system(s) FLUOROSCOPY TIME:  Fluoroscopy Time:  1 minute. COMPLICATIONS: None immediate. PROCEDURE: Informed written consent was obtained from the patient's wife after a thorough discussion of the procedural risks, benefits and alternatives. All questions were addressed. Maximal Sterile Barrier Technique was utilized including caps, mask, sterile gowns, sterile gloves, sterile drape, hand hygiene and skin antiseptic. A timeout was performed prior to the initiation of the procedure. In a prone position, both kidneys were localized by ultrasound. Overlying skin was prepped with  chlorhexidine. Access of both kidneys was performed under direct ultrasound guidance with 21 gauge needles and Accustick systems. Ultrasound image documentation was performed. After confirming positioning in the collecting systems with return of urine and contrast injection, guidewires were advanced through the needles. Transitional dilators were placed. Percutaneous tracts were dilated and bilateral 10 French percutaneous nephrostomy tubes advanced over guidewires. Bilateral tubes were formed with positioning confirmed by fluoroscopic spot images obtained after injection of contrast. Both catheters were secured at the skin with Prolene retention sutures and StatLock devices. Both catheters were connected to gravity drainage bags. FINDINGS: Ultrasound confirms severe bilateral hydronephrosis. After placement of bilateral nephrostomy tubes within the renal pelvis bilaterally, there is excellent return of urine from each tube. IMPRESSION: Bilateral percutaneous nephrostomy tube placement with bilateral 10 French catheters placed and formed in the renal pelvis. Both catheters were connected to gravity drainage bags. Electronically Signed   By: [name] M.D.   On: 10/17/2016 17:08     Assessment: Metastatic prostate cancer - questions answered concerning degarelix, insurance investigation is still pending at this time  Acute renal failure - s/p bilateral nephrostomy tubes, draining well.  Creatinine with mild improvement this morning.  Continue to trend.  Nephrology following.  Severe left hip pain - secondary to metastatic bony lesion.  Dr. Baruch Gouty to see patient if still in house on Thursday.       LOS: 3 days    [facility] 10/19/2016

## 2016-10-19 NOTE — Progress Notes (Signed)
[facility]  ROUNDING NOTE   Subjective:  Renal function now improving. Creatinine down to 6.06. Urine output was 4.4 L over the preceding 24 hours. He appears to be in better spirits today.   Objective:  Vital signs in last 24 hours:  Temp:  [98.3 F (36.8 C)-99.4 F (37.4 C)] 98.3 F (36.8 C) (12/19 1214) Pulse Rate:  [81-89] 81 (12/19 1214) Resp:  [16-17] 16 (12/19 1214) BP: (83-119)/(49-58) 83/49 (12/19 1214) SpO2:  [93 %-95 %] 95 % (12/19 1214) Weight:  [85.6 kg (188 lb 11.2 oz)] 85.6 kg (188 lb 11.2 oz) (12/19 0500)  Weight change: 3.583 kg (7 lb 14.4 oz) Filed Weights   10/17/16 0828 10/18/16 0500 10/19/16 0500  Weight: 82 kg (180 lb 12.8 oz) 83.7 kg (184 lb 9.6 oz) 85.6 kg (188 lb 11.2 oz)    Intake/Output: I/O last 3 completed shifts: In: 2988 [P.O.:240; I.V.:2433; Other:315] Out: 5125 [id number]   Intake/Output this shift:  Total I/O In: 140 [P.O.:120; Other:20] Out: 750 [Urine:750]  Physical Exam: General: No acute distress  Head: Normocephalic, atraumatic. Moist oral mucosal membranes  Eyes: Anicteric  Neck: Supple, trachea midline  Lungs:  Clear to auscultation, normal effort  Heart: S1S2 no rubs  Abdomen:  Soft, nontender, bowel sounds present  Extremities: Trace peripheral edema.  Neurologic: Nonfocal, moving all four extremities  Skin: No lesions       Basic Metabolic Panel:  Recent Labs Lab 10/16/16 1853 10/17/16 0510 10/18/16 0431 10/19/16 0427  NA 136 137 138 136  K 5.6* 6.0* 4.9 4.3  CL 108 107 105 103  CO2 16* 21* 23 24  GLUCOSE 96 112* 106* 126*  BUN 77* 72* 72* 58*  CREATININE 6.69* 6.73* 7.25* 6.06*  CALCIUM 7.7* 7.9* 7.2* 6.3*    Liver Function Tests:  Recent Labs Lab 10/16/16 1853 10/19/16 0427  AST 11*  --   ALT 8*  --   ALKPHOS 240*  --   BILITOT 0.3  --   PROT 6.9  --   ALBUMIN 3.5 2.8*    Recent Labs Lab 10/16/16 1853  LIPASE 29   No results for input(s): AMMONIA in the last 168  hours.  CBC:  Recent Labs Lab 10/16/16 1853 10/17/16 0510 10/19/16 0427  WBC 4.9 6.7 6.8  NEUTROABS 3.0  --  5.2  HGB 7.9* 8.5* 8.1*  HCT 23.6* 25.9* 24.4*  MCV 89.3 89.9 90.6  PLT 239 242 184    Cardiac Enzymes:  Recent Labs Lab 10/16/16 1853  TROPONINI <0.03    BNP: Invalid input(s): POCBNP  CBG: No results for input(s): GLUCAP in the last 168 hours.  Microbiology: No results found for this or any previous visit.  Coagulation Studies:  Recent Labs  10/16/16 1853  LABPROT 13.3  INR 1.01    Urinalysis:  Recent Labs  10/16/16 1700  COLORURINE STRAW*  LABSPEC 1.009  PHURINE 5.0  GLUCOSEU NEGATIVE  HGBUR MODERATE*  BILIRUBINUR NEGATIVE  KETONESUR NEGATIVE  PROTEINUR 100*  NITRITE NEGATIVE  LEUKOCYTESUR NEGATIVE      Imaging: Ir Nephrostomy Placement Left  Result Date: 10/17/2016 INDICATION: Bladder mass causing bilateral distal ureteral obstruction with high-grade bilateral hydronephrosis and acute renal failure. EXAM: IR NEPHROSTOMY PLACEMENT LEFT; IR NEPHROSTOMY PLACEMENT RIGHT COMPARISON:  None. MEDICATIONS: 2 g IV Ancef; The antibiotic was administered in an appropriate time frame prior to skin puncture. ANESTHESIA/SEDATION: Fentanyl 25 mcg IV; Versed 0.5 mg IV Moderate Sedation Time:  30 minutes. The patient was continuously monitored  during the procedure by the interventional radiology nurse under my direct supervision. CONTRAST:  69mL ISOVUE-300 IOPAMIDOL (ISOVUE-300) INJECTION 61% - administered into the collecting system(s) FLUOROSCOPY TIME:  Fluoroscopy Time: 1 minute. COMPLICATIONS: None immediate. PROCEDURE: Informed written consent was obtained from the patient's wife after a thorough discussion of the procedural risks, benefits and alternatives. All questions were addressed. Maximal Sterile Barrier Technique was utilized including caps, mask, sterile gowns, sterile gloves, sterile drape, hand hygiene and skin antiseptic. A timeout was  performed prior to the initiation of the procedure. In a prone position, both kidneys were localized by ultrasound. Overlying skin was prepped with chlorhexidine. Access of both kidneys was performed under direct ultrasound guidance with 21 gauge needles and Accustick systems. Ultrasound image documentation was performed. After confirming positioning in the collecting systems with return of urine and contrast injection, guidewires were advanced through the needles. Transitional dilators were placed. Percutaneous tracts were dilated and bilateral 10 French percutaneous nephrostomy tubes advanced over guidewires. Bilateral tubes were formed with positioning confirmed by fluoroscopic spot images obtained after injection of contrast. Both catheters were secured at the skin with Prolene retention sutures and StatLock devices. Both catheters were connected to gravity drainage bags. FINDINGS: Ultrasound confirms severe bilateral hydronephrosis. After placement of bilateral nephrostomy tubes within the renal pelvis bilaterally, there is excellent return of urine from each tube. IMPRESSION: Bilateral percutaneous nephrostomy tube placement with bilateral 10 French catheters placed and formed in the renal pelvis. Both catheters were connected to gravity drainage bags. Electronically Signed   By: [name] M.D.   On: 10/17/2016 17:08   Ir Nephrostomy Placement Right  Result Date: 10/17/2016 INDICATION: Bladder mass causing bilateral distal ureteral obstruction with high-grade bilateral hydronephrosis and acute renal failure. EXAM: IR NEPHROSTOMY PLACEMENT LEFT; IR NEPHROSTOMY PLACEMENT RIGHT COMPARISON:  None. MEDICATIONS: 2 g IV Ancef; The antibiotic was administered in an appropriate time frame prior to skin puncture. ANESTHESIA/SEDATION: Fentanyl 25 mcg IV; Versed 0.5 mg IV Moderate Sedation Time:  30 minutes. The patient was continuously monitored during the procedure by the interventional radiology nurse under  my direct supervision. CONTRAST:  72mL ISOVUE-300 IOPAMIDOL (ISOVUE-300) INJECTION 61% - administered into the collecting system(s) FLUOROSCOPY TIME:  Fluoroscopy Time: 1 minute. COMPLICATIONS: None immediate. PROCEDURE: Informed written consent was obtained from the patient's wife after a thorough discussion of the procedural risks, benefits and alternatives. All questions were addressed. Maximal Sterile Barrier Technique was utilized including caps, mask, sterile gowns, sterile gloves, sterile drape, hand hygiene and skin antiseptic. A timeout was performed prior to the initiation of the procedure. In a prone position, both kidneys were localized by ultrasound. Overlying skin was prepped with chlorhexidine. Access of both kidneys was performed under direct ultrasound guidance with 21 gauge needles and Accustick systems. Ultrasound image documentation was performed. After confirming positioning in the collecting systems with return of urine and contrast injection, guidewires were advanced through the needles. Transitional dilators were placed. Percutaneous tracts were dilated and bilateral 10 French percutaneous nephrostomy tubes advanced over guidewires. Bilateral tubes were formed with positioning confirmed by fluoroscopic spot images obtained after injection of contrast. Both catheters were secured at the skin with Prolene retention sutures and StatLock devices. Both catheters were connected to gravity drainage bags. FINDINGS: Ultrasound confirms severe bilateral hydronephrosis. After placement of bilateral nephrostomy tubes within the renal pelvis bilaterally, there is excellent return of urine from each tube. IMPRESSION: Bilateral percutaneous nephrostomy tube placement with bilateral 10 French catheters placed and formed in the renal pelvis.  Both catheters were connected to gravity drainage bags. Electronically Signed   By: [name] M.D.   On: 10/17/2016 17:08     Medications:   . sodium chloride  100 mL/hr at 10/19/16 1103   . heparin  5,000 Units Subcutaneous Q8H  . metoprolol  50 mg Oral BID   acetaminophen **OR** acetaminophen, HYDROcodone-acetaminophen, HYDROmorphone (DILAUDID) injection, nitroGLYCERIN, ondansetron **OR** ondansetron (ZOFRAN) IV, promethazine  Assessment/ Plan:  82 y.o. male with medical problems of coronary disease MI in 2014, angioplasty with stent placement, hypertension, enlarged prostate, who was admitted to [facility] on 10/16/2016 for evaluation of acute onset low back pain. Upon presentation, his creatinine was found to be 6.69.  Only known prior creatinine is 0.94 from January 2014.   1.  Acute renal failure due to obstuctive uropathy. 2.  Bilateral hydronephrosis. 3.  Hyperkalemia. 4.  Anemia not otherwise specified. 5.  Metabolic acidosis, improved 6.  Suspected prostate cancer.  Plan:  Renal function has improved somewhat from yesterday.  Creatinine down to 6.06.  Urine output over the preceding 24 hours was 4.4 L.  Therefore no indication for dialysis at the moment.  Continue to monitor renal function trend daily.  We will monitor for postobstructive diuresis as well.  Urology also following on the case.  Defer timing of prostate biopsy to them.  Avoid epogen given suspicion of prostate cancer.     LOS: 3 Johnathan Gould 12/19/20172:26 PM

## 2016-10-19 NOTE — Progress Notes (Signed)
[facility] [place] NAME: Johnathan Gould    MR#:  [id number]  DATE OF BIRTH:  August 28, 1935  SUBJECTIVE:  CHIEF COMPLAINT:   Chief Complaint  Patient presents with  . Back Pain   Patient presented with low back pain. Was found to have a bladder mass with bilateral hydronephrosis. Status post bilateral nephrostomy tubes placed on 10/17/2016  Good urine output of 4.5 L over last 24 hours. On IV fluids.  REVIEW OF SYSTEMS:    Review of Systems  Constitutional: Positive for malaise/fatigue. Negative for chills and fever.  HENT: Negative for sore throat.   Eyes: Negative for blurred vision, double vision and pain.  Respiratory: Negative for cough, hemoptysis, shortness of breath and wheezing.   Cardiovascular: Negative for chest pain, palpitations, orthopnea and leg swelling.  Gastrointestinal: Positive for nausea. Negative for abdominal pain, constipation, diarrhea, heartburn and vomiting.  Genitourinary: Negative for dysuria and hematuria.  Musculoskeletal: Positive for back pain. Negative for joint pain.  Skin: Negative for rash.  Neurological: Positive for weakness. Negative for sensory change, speech change, focal weakness and headaches.  Endo/Heme/Allergies: Does not bruise/bleed easily.  Psychiatric/Behavioral: Negative for depression. The patient is not nervous/anxious.     DRUG ALLERGIES:  Not on File  VITALS:  Blood pressure (!) 119/54, pulse 84, temperature 98.5 F (36.9 C), temperature source Oral, resp. rate 17, height 5\' 10"  (1.778 m), weight 85.6 kg (188 lb 11.2 oz), SpO2 95 %.  PHYSICAL EXAMINATION:   Physical Exam  GENERAL:  81 y.o.-year-old patient lying in the bed with no acute distress.  EYES: Pupils equal, round, reactive to light and accommodation. No scleral icterus. Extraocular muscles intact.  HEENT: Head atraumatic, normocephalic. Oropharynx and nasopharynx clear.  NECK:  Supple, no jugular venous distention.  No thyroid enlargement, no tenderness.  LUNGS: Normal breath sounds bilaterally, no wheezing, rales, rhonchi. No use of accessory muscles of respiration.  CARDIOVASCULAR: S1, S2 normal. No murmurs, rubs, or gallops.  ABDOMEN: Soft, nontender, nondistended. Bowel sounds present. No organomegaly or mass. Bilateral nephrostomy tubes in place with blood-tinged urine EXTREMITIES: No cyanosis, clubbing or edema b/l.    NEUROLOGIC: Cranial nerves II through XII are intact. No focal Motor or sensory deficits b/l.   PSYCHIATRIC: The patient is alert and oriented x 3.  SKIN: No obvious rash, lesion, or ulcer.   LABORATORY PANEL:   CBC  Recent Labs Lab 10/19/16 0427  WBC 6.8  HGB 8.1*  HCT 24.4*  PLT 184   ------------------------------------------------------------------------------------------------------------------ Chemistries   Recent Labs Lab 10/16/16 1853  10/19/16 0427  NA 136  < > 136  K 5.6*  < > 4.3  CL 108  < > 103  CO2 16*  < > 24  GLUCOSE 96  < > 126*  BUN 77*  < > 58*  CREATININE 6.69*  < > 6.06*  CALCIUM 7.7*  < > 6.3*  AST 11*  --   --   ALT 8*  --   --   ALKPHOS 240*  --   --   BILITOT 0.3  --   --   < > = values in this interval not displayed. ------------------------------------------------------------------------------------------------------------------  Cardiac Enzymes  Recent Labs Lab 10/16/16 1853  TROPONINI <0.03   ------------------------------------------------------------------------------------------------------------------  RADIOLOGY:  Ir Nephrostomy Placement Left  Result Date: 10/17/2016 INDICATION: Bladder mass causing bilateral distal ureteral obstruction with high-grade bilateral hydronephrosis and acute renal failure. EXAM: IR NEPHROSTOMY PLACEMENT LEFT; IR NEPHROSTOMY PLACEMENT RIGHT COMPARISON:  None. MEDICATIONS: 2 g IV Ancef; The antibiotic was administered in an appropriate time frame prior to skin puncture. ANESTHESIA/SEDATION:  Fentanyl 25 mcg IV; Versed 0.5 mg IV Moderate Sedation Time:  30 minutes. The patient was continuously monitored during the procedure by the interventional radiology nurse under my direct supervision. CONTRAST:  70mL ISOVUE-300 IOPAMIDOL (ISOVUE-300) INJECTION 61% - administered into the collecting system(s) FLUOROSCOPY TIME:  Fluoroscopy Time: 1 minute. COMPLICATIONS: None immediate. PROCEDURE: Informed written consent was obtained from the patient's wife after a thorough discussion of the procedural risks, benefits and alternatives. All questions were addressed. Maximal Sterile Barrier Technique was utilized including caps, mask, sterile gowns, sterile gloves, sterile drape, hand hygiene and skin antiseptic. A timeout was performed prior to the initiation of the procedure. In a prone position, both kidneys were localized by ultrasound. Overlying skin was prepped with chlorhexidine. Access of both kidneys was performed under direct ultrasound guidance with 21 gauge needles and Accustick systems. Ultrasound image documentation was performed. After confirming positioning in the collecting systems with return of urine and contrast injection, guidewires were advanced through the needles. Transitional dilators were placed. Percutaneous tracts were dilated and bilateral 10 French percutaneous nephrostomy tubes advanced over guidewires. Bilateral tubes were formed with positioning confirmed by fluoroscopic spot images obtained after injection of contrast. Both catheters were secured at the skin with Prolene retention sutures and StatLock devices. Both catheters were connected to gravity drainage bags. FINDINGS: Ultrasound confirms severe bilateral hydronephrosis. After placement of bilateral nephrostomy tubes within the renal pelvis bilaterally, there is excellent return of urine from each tube. IMPRESSION: Bilateral percutaneous nephrostomy tube placement with bilateral 10 French catheters placed and formed in the renal  pelvis. Both catheters were connected to gravity drainage bags. Electronically Signed   By: [name] M.D.   On: 10/17/2016 17:08   Ir Nephrostomy Placement Right  Result Date: 10/17/2016 INDICATION: Bladder mass causing bilateral distal ureteral obstruction with high-grade bilateral hydronephrosis and acute renal failure. EXAM: IR NEPHROSTOMY PLACEMENT LEFT; IR NEPHROSTOMY PLACEMENT RIGHT COMPARISON:  None. MEDICATIONS: 2 g IV Ancef; The antibiotic was administered in an appropriate time frame prior to skin puncture. ANESTHESIA/SEDATION: Fentanyl 25 mcg IV; Versed 0.5 mg IV Moderate Sedation Time:  30 minutes. The patient was continuously monitored during the procedure by the interventional radiology nurse under my direct supervision. CONTRAST:  75mL ISOVUE-300 IOPAMIDOL (ISOVUE-300) INJECTION 61% - administered into the collecting system(s) FLUOROSCOPY TIME:  Fluoroscopy Time: 1 minute. COMPLICATIONS: None immediate. PROCEDURE: Informed written consent was obtained from the patient's wife after a thorough discussion of the procedural risks, benefits and alternatives. All questions were addressed. Maximal Sterile Barrier Technique was utilized including caps, mask, sterile gowns, sterile gloves, sterile drape, hand hygiene and skin antiseptic. A timeout was performed prior to the initiation of the procedure. In a prone position, both kidneys were localized by ultrasound. Overlying skin was prepped with chlorhexidine. Access of both kidneys was performed under direct ultrasound guidance with 21 gauge needles and Accustick systems. Ultrasound image documentation was performed. After confirming positioning in the collecting systems with return of urine and contrast injection, guidewires were advanced through the needles. Transitional dilators were placed. Percutaneous tracts were dilated and bilateral 10 French percutaneous nephrostomy tubes advanced over guidewires. Bilateral tubes were formed with  positioning confirmed by fluoroscopic spot images obtained after injection of contrast. Both catheters were secured at the skin with Prolene retention sutures and StatLock devices. Both catheters were connected to gravity drainage bags. FINDINGS: Ultrasound confirms severe  bilateral hydronephrosis. After placement of bilateral nephrostomy tubes within the renal pelvis bilaterally, there is excellent return of urine from each tube. IMPRESSION: Bilateral percutaneous nephrostomy tube placement with bilateral 10 French catheters placed and formed in the renal pelvis. Both catheters were connected to gravity drainage bags. Electronically Signed   By: [name] M.D.   On: 10/17/2016 17:08     ASSESSMENT AND PLAN:   * Acute renal failure due to bilateral hydronephrosis with bladder mass - improving Status post bilateral nephrostomy tubes Elevated PSA suggesting prostate cancer. Further management options to be discussed by urology.  * Left hip pain with bone metastases. Radiation oncology consulted.  * Hyperkalemia due to acute renal failure - resolved  * Hypertension Home medications Stopped lisinopril  * Anemia of chronic disease  * DVT prophylaxis with heparin after his procedure  All the records are reviewed and case discussed with Care Management/Social Workerr. Management plans discussed with the patient, family and they are in agreement.  CODE STATUS: DNR  DVT Prophylaxis: SCDs  TOTAL TIME TAKING CARE OF THIS PATIENT: 35 minutes.   POSSIBLE D/C IN 2-3 DAYS, DEPENDING ON CLINICAL CONDITION.  Hillary Bow R M.D on 10/19/2016 at 12:04 PM  Between 7am to 6pm - Pager - [phone]  After 6pm go to [email or web address] - password [email or web address] [facility]  Office  [phone]  CC: Primary care physician; No PCP Per Patient  Note: This dictation was prepared with Dragon dictation along with smaller phrase technology. Any transcriptional errors that result from  this process are unintentional.

## 2016-10-20 ENCOUNTER — Inpatient Hospital Stay: Payer: Medicare Other

## 2016-10-20 ENCOUNTER — Encounter: Payer: Self-pay | Admitting: Radiology

## 2016-10-20 DIAGNOSIS — N179 Acute kidney failure, unspecified: Secondary | ICD-10-CM

## 2016-10-20 DIAGNOSIS — C61 Malignant neoplasm of prostate: Secondary | ICD-10-CM

## 2016-10-20 LAB — CBC WITH DIFFERENTIAL/PLATELET
BASOS ABS: 0 10*3/uL (ref 0–0.1)
BASOS PCT: 0 %
EOS ABS: 0.1 10*3/uL (ref 0–0.7)
Eosinophils Relative: 1 %
HCT: 22.2 % — ABNORMAL LOW (ref 40.0–52.0)
Hemoglobin: 7.4 g/dL — ABNORMAL LOW (ref 13.0–18.0)
Lymphocytes Relative: 13 %
Lymphs Abs: 0.8 10*3/uL — ABNORMAL LOW (ref 1.0–3.6)
MCH: 29.8 pg (ref 26.0–34.0)
MCHC: 33.3 g/dL (ref 32.0–36.0)
MCV: 89.4 fL (ref 80.0–100.0)
MONO ABS: 0.7 10*3/uL (ref 0.2–1.0)
MONOS PCT: 11 %
NEUTROS PCT: 75 %
Neutro Abs: 4.6 10*3/uL (ref 1.4–6.5)
Platelets: 187 10*3/uL (ref 150–440)
RBC: 2.48 MIL/uL — ABNORMAL LOW (ref 4.40–5.90)
RDW: 14.3 % (ref 11.5–14.5)
WBC: 6.2 10*3/uL (ref 3.8–10.6)

## 2016-10-20 LAB — BASIC METABOLIC PANEL
Anion gap: 8 (ref 5–15)
BUN: 50 mg/dL — ABNORMAL HIGH (ref 6–20)
CALCIUM: 6.2 mg/dL — AB (ref 8.9–10.3)
CO2: 24 mmol/L (ref 22–32)
CREATININE: 4.85 mg/dL — AB (ref 0.61–1.24)
Chloride: 105 mmol/L (ref 101–111)
GFR, EST AFRICAN AMERICAN: 12 mL/min — AB (ref >60)
GFR, EST NON AFRICAN AMERICAN: 10 mL/min — AB (ref >60)
Glucose, Bld: 100 mg/dL — ABNORMAL HIGH (ref 65–99)
Potassium: 3.9 mmol/L (ref 3.5–5.1)
Sodium: 137 mmol/L (ref 135–145)

## 2016-10-20 MED ORDER — LACTULOSE 10 GM/15ML PO SOLN: 20.0000 g | Freq: Two times a day (BID) | ORAL | Status: DC | PRN

## 2016-10-20 MED ORDER — NYSTATIN 100000 UNIT/ML MT SUSP
5.0000 mL | Freq: Four times a day (QID) | OROMUCOSAL | Status: DC
  Administered 2016-10-20 – 2016-10-23 (×11): 500000 [IU] via OROMUCOSAL
  Filled 2016-10-20 (×11): qty 5

## 2016-10-20 MED ORDER — HYDROMORPHONE HCL 1 MG/ML IJ SOLN
1.0000 mg | INTRAMUSCULAR | Status: DC | PRN
  Administered 2016-10-20 – 2016-10-22 (×4): 1 mg via INTRAVENOUS
  Filled 2016-10-20 (×4): qty 1

## 2016-10-20 MED ORDER — BISACODYL 5 MG PO TBEC
10.0000 mg | DELAYED_RELEASE_TABLET | Freq: Every day | ORAL | Status: AC
  Administered 2016-10-20 – 2016-10-22 (×3): 10 mg via ORAL
  Filled 2016-10-20 (×3): qty 2

## 2016-10-20 MED ORDER — FENTANYL 12 MCG/HR TD PT72
12.5000 ug | MEDICATED_PATCH | TRANSDERMAL | Status: DC
  Administered 2016-10-20 – 2016-10-23 (×2): 12.5 ug via TRANSDERMAL
  Filled 2016-10-20 (×2): qty 1

## 2016-10-20 MED ORDER — SENNOSIDES-DOCUSATE SODIUM 8.6-50 MG PO TABS
2.0000 | ORAL_TABLET | Freq: Two times a day (BID) | ORAL | Status: DC
  Administered 2016-10-20 – 2016-10-23 (×5): 2 via ORAL
  Filled 2016-10-20 (×5): qty 2

## 2016-10-20 MED ORDER — POLYETHYLENE GLYCOL 3350 17 G PO PACK: 17.0000 g | PACK | Freq: Every day | ORAL | Status: DC

## 2016-10-20 MED ORDER — BISACODYL 5 MG PO TBEC: 10.0000 mg | DELAYED_RELEASE_TABLET | Freq: Every day | ORAL | Status: DC | PRN

## 2016-10-20 MED ORDER — TECHNETIUM TC 99M MEDRONATE IV KIT
23.8500 | PACK | Freq: Once | INTRAVENOUS | Status: AC | PRN
Start: 2016-10-20 — End: 2016-10-20
  Administered 2016-10-20: 11:00:00 23.85 via INTRAVENOUS

## 2016-10-20 NOTE — Progress Notes (Signed)
Urology Consult Follow Up  Subjective: Patient doing well. Cr improved to 4.85 with 3.1 UOP.  Anti-infectives: Anti-infectives    Start     Dose/Rate Route Frequency Ordered Stop   10/17/16 1400  ceFAZolin (ANCEF) 1 GM/50ML IVPB    Comments:  Sharen Heck: cabinet override      10/17/16 1400 10/18/16 0159   10/17/16 1400  ceFAZolin (ANCEF) 1 GM/50ML IVPB    Comments:  Sharen Heck: cabinet override      10/17/16 1400 10/18/16 0159   10/17/16 1030  ceFAZolin (ANCEF) IVPB 2g/100 mL premix     2 g 200 mL/hr over 30 Minutes Intravenous To Radiology 10/17/16 0903 10/17/16 1450      Current Facility-Administered Medications  Medication Dose Route Frequency Provider Last Rate Last Dose  . 0.9 %  sodium chloride infusion   Intravenous Continuous Hillary Bow, MD 100 mL/hr at 10/20/16 0733    . acetaminophen (TYLENOL) tablet 650 mg  650 mg Oral Q6H PRN Lance Coon, MD   650 mg at 10/18/16 1621   Or  . acetaminophen (TYLENOL) suppository 650 mg  650 mg Rectal Q6H PRN Lance Coon, MD      . heparin injection 5,000 Units  5,000 Units Subcutaneous Q8H Saverio Danker, PA-C   5,000 Units at 10/20/16 [id number]  . HYDROcodone-acetaminophen (NORCO/VICODIN) 5-325 MG per tablet 1 tablet  1 tablet Oral Q6H PRN Hillary Bow, MD   1 tablet at 10/20/16 0512  . HYDROmorphone (DILAUDID) injection 0.5 mg  0.5 mg Intravenous Q4H PRN Lance Coon, MD   0.5 mg at 10/19/16 2129  . metoprolol (LOPRESSOR) tablet 50 mg  50 mg Oral BID Lance Coon, MD   50 mg at 10/19/16 2120  . nitroGLYCERIN (NITROSTAT) SL tablet 0.4 mg  0.4 mg Sublingual Q5 min PRN Srikar Sudini, MD      . ondansetron (ZOFRAN) tablet 4 mg  4 mg Oral Q6H PRN Lance Coon, MD       Or  . ondansetron [facility]) injection 4 mg  4 mg Intravenous Q6H PRN Lance Coon, MD   4 mg at 10/19/16 1543  . polyethylene glycol (MIRALAX / GLYCOLAX) packet 17 g  17 g Oral Daily PRN Hillary Bow, MD   17 g at 10/19/16 2120  . promethazine (PHENERGAN)  injection 12.5 mg  12.5 mg Intravenous Q6H PRN Lance Coon, MD   12.5 mg at 10/19/16 2127     Objective: Vital signs in last 24 hours: Temp:  [98.2 F (36.8 C)-98.9 F (37.2 C)] 98.2 F (36.8 C) (12/20 0518) Pulse Rate:  [81-93] 93 (12/20 0518) Resp:  [16-20] 20 (12/20 0518) BP: (83-104)/(49-56) 104/56 (12/20 0518) SpO2:  [95 %-96 %] 96 % (12/20 0518) Weight:  [189 lb 3 oz (85.8 kg)] 189 lb 3 oz (85.8 kg) (12/20 0334)  Intake/Output from previous day: 12/19 0701 - 12/20 0700 In: 3560 [P.O.:800; I.V.:2720] Out: 3095 [Urine:3095] Intake/Output this shift: Total I/O In: -  Out: 800 [Urine:800]   Physical Exam Constitutional: Well nourished. Alert and oriented, No acute distress. HEENT: [place] AT, moist mucus membranes. Trachea midline, no masses. Cardiovascular: No clubbing, cyanosis, or edema. Respiratory: Normal respiratory effort, no increased work of breathing. GI: Abdomen is soft, non tender, non distended, no abdominal masses. Liver and spleen not palpable.  No hernias appreciated.  Stool sample for occult testing is not indicated.   GU: No CVA tenderness.  No bladder fullness or masses.   PCN dressings are clean and dry, draining clear, yellow  urine.   Skin: No rashes, bruises or suspicious lesions. Lymph: No cervical or inguinal adenopathy. Neurologic: Grossly intact, no focal deficits, moving all 4 extremities. Psychiatric: Normal mood and affect.  Lab Results:   Recent Labs  10/19/16 0427 10/20/16 0418  WBC 6.8 6.2  HGB 8.1* 7.4*  HCT 24.4* 22.2*  PLT 184 187   BMET  Recent Labs  10/19/16 0427 10/20/16 0418  NA 136 137  K 4.3 3.9  CL 103 105  CO2 24 24  GLUCOSE 126* 100*  BUN 58* 50*  CREATININE 6.06* 4.85*  CALCIUM 6.3* 6.2*   PT/INR No results for input(s): LABPROT, INR in the last 72 hours. ABG No results for input(s): PHART, HCO3 in the last 72 hours.  Invalid input(s): PCO2, PO2  Studies/Results: No results  found.   Assessment: Metastatic prostate cancer - await approval for firmagon  Acute renal failure - s/p bilateral nephrostomy tubes, draining well.  Renal function improving. Nephrology following.  Severe left hip pain - secondary to metastatic bony lesion.  Dr. Baruch Gouty to see patient if still in house on Thursday.     LOS: 4 days    Horald Pollen [facility] 10/20/2016

## 2016-10-20 NOTE — Care Management Important Message (Signed)
Important Message  Patient Details  Name: KYLOR HORIGAN MRN: [id number] Date of Birth: [birth year]   Medicare Important Message Given:  Yes    Shelbie Ammons, RN 10/20/2016, 11:56 AM

## 2016-10-20 NOTE — Evaluation (Signed)
Physical Therapy Evaluation Patient Details Name: Johnathan Gould MRN: [name] DOB: 08-22-1935 Today's Date: 10/20/2016   History of Present Illness  Pt admitted for complaints of back and L hip pain. Pt then noted with bladder mass, now diagnosed with prostate cancer with metastatic bony lesions. Pt with history of HTN and CAD. Pt underwent procedure on 12/17 for B nephrostomy placement   Clinical Impression  Pt is a pleasant 81 year old male who was admitted for bladder mass, now diagnosed with prostate cancer. Pt performs bed mobility with cga and transfers/ambulation with min assist and use of IV pole. Pt very limited by unsteadiness and pain with all OOB. Pt demonstrates deficits with strength/pain/endurance/balance. Pt is currently not at baseline level. Pt performed all mobility while on room air with sats WNL, RN notified, O2 left off. Would benefit from skilled PT to address above deficits and promote optimal return to PLOF; recommend transition to STR upon discharge from acute hospitalization, depending on progress may be able to upgrade to home dc.       Follow Up Recommendations SNF (pending progress)    Equipment Recommendations  Rolling walker with 5" wheels    Recommendations for Other Services       Precautions / Restrictions Precautions Precautions: Fall Restrictions Weight Bearing Restrictions: No      Mobility  Bed Mobility Overal bed mobility: Needs Assistance Bed Mobility: Supine to Sit     Supine to sit: Min guard     General bed mobility comments: safe technique performed with cues given for sequencing. Once seated at EOB, pt able to sit with supervision.  Transfers Overall transfer level: Needs assistance Equipment used: 1 person hand held assist Transfers: Sit to/from Stand Sit to Stand: Min assist         General transfer comment: unsteady during inital standing, needing HHA and then assist provided via IV  pole  Ambulation/Gait Ambulation/Gait assistance: Min assist Ambulation Distance (Feet): 2 Feet Assistive device:  (IV pole) Gait Pattern/deviations: Step-to pattern     General Gait Details: ambulated to recliner with heavy guarding on L side secondary to pain. Use of IV pole for assistance. Unable to further ambulate at this time  Stairs            Wheelchair Mobility    Modified Rankin (Stroke Patients Only)       Balance Overall balance assessment: Needs assistance Sitting-balance support: Feet supported Sitting balance-Leahy Scale: Fair     Standing balance support: Single extremity supported Standing balance-Leahy Scale: Poor                               Pertinent Vitals/Pain Pain Assessment: 0-10 Pain Score: 10-Worst pain ever Pain Location: L hip/knee Pain Descriptors / Indicators: Aching;Discomfort;Guarding Pain Intervention(s): Limited activity within patient's tolerance;Patient requesting pain meds-RN notified    Home Living Family/patient expects to be discharged to:: Private residence Living Arrangements: Spouse/significant other Available Help at Discharge: Family Type of Home: House Home Access: Stairs to enter Entrance Stairs-Rails: None (has B walls that he typically uses) Technical brewer of Steps: 2 Home Layout: One level Home Equipment: None      Prior Function Level of Independence: Independent         Comments: active, doing yardwork     Hand Dominance        Extremity/Trunk Assessment   Upper Extremity Assessment Upper Extremity Assessment: Overall WFL for tasks assessed  Lower Extremity Assessment Lower Extremity Assessment: Generalized weakness (L LE grossly 3+/5; R LE grossly 4/5)       Communication   Communication: No difficulties  Cognition Arousal/Alertness: Awake/alert Behavior During Therapy: WFL for tasks assessed/performed Overall Cognitive Status: Within Functional Limits for  tasks assessed                      General Comments      Exercises     Assessment/Plan    PT Assessment Patient needs continued PT services  PT Problem List Decreased strength;Decreased activity tolerance;Decreased balance;Decreased mobility;Decreased safety awareness;Decreased knowledge of use of DME;Pain          PT Treatment Interventions DME instruction;Gait training;Therapeutic exercise    PT Goals (Current goals can be found in the Care Plan section)  Acute Rehab PT Goals Patient Stated Goal: to get stronger PT Goal Formulation: With patient Time For Goal Achievement: 11/03/16 Potential to Achieve Goals: Good    Frequency Min 2X/week   Barriers to discharge        Co-evaluation               End of Session Equipment Utilized During Treatment: Oxygen Activity Tolerance: Patient limited by pain Patient left: in chair;with chair alarm set Nurse Communication: Mobility status         Time: [id number] PT Time Calculation (min) (ACUTE ONLY): 16 min   Charges:   PT Evaluation $PT Eval Moderate Complexity: 1 Procedure     PT G Codes:        Abdifatah Colquhoun 10/29/2016, 11:39 AM  Greggory Stallion, PT, DPT [phone]

## 2016-10-20 NOTE — Progress Notes (Signed)
[facility]  ROUNDING NOTE   Subjective:  Patient seen at bedside. Renal function continues to improve. Next line creatinine down to 4.85. Good urine output at 3 L over the preceding 24 hours.  Objective:  Vital signs in last 24 hours:  Temp:  [98.2 F (36.8 C)-99.2 F (37.3 C)] 99.2 F (37.3 C) (12/20 1556) Pulse Rate:  [88-93] 91 (12/20 1556) Resp:  [18-20] 18 (12/20 1556) BP: (103-121)/(52-62) 113/52 (12/20 1556) SpO2:  [93 %-96 %] 93 % (12/20 1556) Weight:  [85.8 kg (189 lb 3 oz)] 85.8 kg (189 lb 3 oz) (12/20 0334)  Weight change: 0.221 kg (7.8 oz) Filed Weights   10/18/16 0500 10/19/16 0500 10/20/16 0334  Weight: 83.7 kg (184 lb 9.6 oz) 85.6 kg (188 lb 11.2 oz) 85.8 kg (189 lb 3 oz)    Intake/Output: I/O last 3 completed shifts: In: [id number] [P.O.:800; I.V.:4848; Other:60] Out: [id number] [Urine:5595]   Intake/Output this shift:  Total I/O In: 260 [P.O.:240; Other:20] Out: 2225 [Urine:2225]  Physical Exam: General: No acute distress  Head: Normocephalic, atraumatic. Moist oral mucosal membranes  Eyes: Anicteric  Neck: Supple, trachea midline  Lungs:  Clear to auscultation, normal effort  Heart: S1S2 no rubs  Abdomen:  Soft, nontender, bowel sounds present  Extremities: Trace peripheral edema.  Neurologic: Nonfocal, moving all four extremities  Skin: No lesions       Basic Metabolic Panel:  Recent Labs Lab 10/16/16 1853 10/17/16 0510 10/18/16 0431 10/19/16 0427 10/20/16 0418  NA 136 137 138 136 137  K 5.6* 6.0* 4.9 4.3 3.9  CL 108 107 105 103 105  CO2 16* 21* 23 24 24   GLUCOSE 96 112* 106* 126* 100*  BUN 77* 72* 72* 58* 50*  CREATININE 6.69* 6.73* 7.25* 6.06* 4.85*  CALCIUM 7.7* 7.9* 7.2* 6.3* 6.2*    Liver Function Tests:  Recent Labs Lab 10/16/16 1853 10/19/16 0427  AST 11*  --   ALT 8*  --   ALKPHOS 240*  --   BILITOT 0.3  --   PROT 6.9  --   ALBUMIN 3.5 2.8*    Recent Labs Lab 10/16/16 1853  LIPASE 29   No results for  input(s): AMMONIA in the last 168 hours.  CBC:  Recent Labs Lab 10/16/16 1853 10/17/16 0510 10/19/16 0427 10/20/16 0418  WBC 4.9 6.7 6.8 6.2  NEUTROABS 3.0  --  5.2 4.6  HGB 7.9* 8.5* 8.1* 7.4*  HCT 23.6* 25.9* 24.4* 22.2*  MCV 89.3 89.9 90.6 89.4  PLT 239 242 184 187    Cardiac Enzymes:  Recent Labs Lab 10/16/16 1853  TROPONINI <0.03    BNP: Invalid input(s): POCBNP  CBG: No results for input(s): GLUCAP in the last 168 hours.  Microbiology: No results found for this or any previous visit.  Coagulation Studies: No results for input(s): LABPROT, INR in the last 72 hours.  Urinalysis: No results for input(s): COLORURINE, LABSPEC, PHURINE, GLUCOSEU, HGBUR, BILIRUBINUR, KETONESUR, PROTEINUR, UROBILINOGEN, NITRITE, LEUKOCYTESUR in the last 72 hours.  Invalid input(s): APPERANCEUR    Imaging: Nm Bone Scan Whole Body  Result Date: 10/20/2016 CLINICAL DATA:  History of prostate carcinoma EXAM: NUCLEAR MEDICINE WHOLE BODY BONE SCAN TECHNIQUE: Whole body anterior and posterior images were obtained approximately 3 hours after intravenous injection of radiopharmaceutical. RADIOPHARMACEUTICALS:  23.85 mCi Technetium-96m MDP IV COMPARISON:  None. FINDINGS: Is adequate uptake of radioactive tracer throughout the bony skeleton. Multifocal areas of increased activity are identified throughout the pelvis, bilateral femurs, ribcage, right scapula,  thoracic, cervical and lumbar spines consistent with metastatic disease. Some degenerative changes are noted in the feet. IMPRESSION: Diffuse metastatic disease. Electronically Signed   By: Inez Catalina M.D.   On: 10/20/2016 15:21     Medications:   . sodium chloride 100 mL/hr at 10/20/16 0733   . bisacodyl  10 mg Oral Daily  . fentaNYL  12.5 mcg Transdermal Q72H  . metoprolol  50 mg Oral BID  . nystatin  5 mL Mouth/Throat QID  . senna-docusate  2 tablet Oral BID   acetaminophen **OR** acetaminophen, HYDROcodone-acetaminophen,  HYDROmorphone (DILAUDID) injection, lactulose, nitroGLYCERIN, ondansetron **OR** ondansetron (ZOFRAN) IV, promethazine  Assessment/ Plan:  81 y.o. male with medical problems of coronary disease MI in 2014, angioplasty with stent placement, hypertension, enlarged prostate, who was admitted to [facility] on 10/16/2016 for evaluation of acute onset low back pain. Upon presentation, his creatinine was found to be 6.69.  Only known prior creatinine is 0.94 from January 2014.   1.  Acute renal failure due to obstuctive uropathy. 2.  Bilateral hydronephrosis. 3.  Hyperkalemia. 4.  Anemia not otherwise specified. 5.  Metabolic acidosis, improved 6.  Suspected prostate cancer.  Plan:  Renal function appears to be improving as expected now. Creatinine down to 4.85 with good urine output of 3 L over the preceding 24 hours. He's had an additional 2 L of urine output today. Continue to monitor renal parameters on a daily basis and avoid nephrotoxins as possible. Otherwise further urologic plan per urology.     LOS: 4 Kipper Buch 12/20/20174:41 PM

## 2016-10-20 NOTE — Progress Notes (Signed)
[place] [facility] [place] NAME: Johnathan Gould    MR#:  [name]  DATE OF BIRTH:  28-Mar-1935  SUBJECTIVE:  CHIEF COMPLAINT:   Chief Complaint  Patient presents with  . Back Pain   - Patient admitted with acute urinary retention resulting in acute renal failure secondary to bladder obstruction resulting in bilateral hydronephrosis. -Has bilateral nephrostomy tubes in place. Creatinine is improving. Complains of significant bone pain especially on the right side in the back. Has bone scan scheduled -Possible prostate cancer suspected  REVIEW OF SYSTEMS:  Review of Systems  Constitutional: Positive for malaise/fatigue. Negative for chills, fever and weight loss.  HENT: Negative for ear discharge, ear pain, hearing loss and tinnitus.   Eyes: Negative for blurred vision, double vision and photophobia.  Respiratory: Negative for cough, hemoptysis, shortness of breath and wheezing.   Cardiovascular: Negative for chest pain, palpitations, orthopnea and leg swelling.  Gastrointestinal: Negative for abdominal pain, constipation, diarrhea, heartburn, melena, nausea and vomiting.  Genitourinary: Negative for dysuria.  Musculoskeletal: Positive for back pain and myalgias. Negative for neck pain.  Skin: Negative for rash.  Neurological: Negative for dizziness, sensory change, speech change, focal weakness and headaches.  Endo/Heme/Allergies: Does not bruise/bleed easily.  Psychiatric/Behavioral: Negative for depression.    DRUG ALLERGIES:  Not on File  VITALS:  Blood pressure 121/62, pulse 88, temperature 98.2 F (36.8 C), temperature source Oral, resp. rate 18, height 5\' 10"  (1.778 m), weight 85.8 kg (189 lb 3 oz), SpO2 96 %.  PHYSICAL EXAMINATION:  Physical Exam  GENERAL:  81 y.o.-year-old patient lying in the bed with no acute distress.  EYES: Pupils equal, round, reactive to light and accommodation. No scleral icterus. Extraocular muscles  intact.  HEENT: Head atraumatic, normocephalic. Oropharynx and nasopharynx clear.  NECK:  Supple, no jugular venous distention. No thyroid enlargement, no tenderness.  LUNGS: Normal breath sounds bilaterally, no wheezing, rales,rhonchi or crepitation. No use of accessory muscles of respiration.  CARDIOVASCULAR: S1, S2 normal. No murmurs, rubs, or gallops.  ABDOMEN: Soft, nontender, nondistended. Bowel sounds present. No organomegaly or mass. I lateral nephrostomy tubes in place with clear urine draining EXTREMITIES: No pedal edema, cyanosis, or clubbing.  Significant pain on turning towards the right side. NEUROLOGIC: Cranial nerves II through XII are intact. Muscle strength 5/5 in all extremities. Sensation intact. Gait not checked.  PSYCHIATRIC: The patient is alert and oriented x 3.  SKIN: No obvious rash, lesion, or ulcer.    LABORATORY PANEL:   CBC  Recent Labs Lab 10/20/16 0418  WBC 6.2  HGB 7.4*  HCT 22.2*  PLT 187   ------------------------------------------------------------------------------------------------------------------  Chemistries   Recent Labs Lab 10/16/16 1853  10/20/16 0418  NA 136  < > 137  K 5.6*  < > 3.9  CL 108  < > 105  CO2 16*  < > 24  GLUCOSE 96  < > 100*  BUN 77*  < > 50*  CREATININE 6.69*  < > 4.85*  CALCIUM 7.7*  < > 6.2*  AST 11*  --   --   ALT 8*  --   --   ALKPHOS 240*  --   --   BILITOT 0.3  --   --   < > = values in this interval not displayed. ------------------------------------------------------------------------------------------------------------------  Cardiac Enzymes  Recent Labs Lab 10/16/16 1853  TROPONINI <0.03   ------------------------------------------------------------------------------------------------------------------  RADIOLOGY:  No results found.  EKG:   Orders placed or performed in  visit on 11/05/12  . EKG 12-Lead  . EKG 12-Lead  . EKG 12-Lead  . EKG 12-Lead    ASSESSMENT AND PLAN:    81 year old male with past medical history significant for coronary artery disease and hypertension admitted to the hospital with back pain and noted to have bilateral hydronephrosis from an obstructing bladder mass.  #1 acute renal failure-obstructive nephropathy -CT abdomen with obstructing bladder mass, likely metastatic from prostrate. Has bilateral nephrostomy tubes placed in. Now draining clear urine. -Creatinine improved from 7 to 4.8 today -Monitor urine output. Appreciate nephrology consult  #2 clinical diagnosis of prostate cancer- enlarged nodular prostrate, PSA greater than 400. Clinical diagnosis of prostate cancer-metastatic. -Urology consult appreciated. Prostrate biopsy as outpatient.  #3 bone pain-significant bone pain secondary to osseous metastatic sepsis. Will order a bone scan today. Radiation oncology consulted for possible palliative radiation. Adjust pain medication. Start fentanyl patch  #4 hypertension-on metoprolol twice a day  #5 anemia of chronic disease- Baseline hemoglobin around 8, steadily dropping. Hemoglobin at 7.4 today -Closely monitor. Transfuse if hemoglobin less than 7  #6 DVT prophylaxis-hold heparin due to anemia at this time.  #7 constipation-secondary to pain medications. We'll adjust pain medications   Physical therapy consult.   All the records are reviewed and case discussed with Care Management/Social Workerr. Management plans discussed with the patient, family and they are in agreement.  CODE STATUS: DO NOT RESUSCITATE  TOTAL TIME TAKING CARE OF THIS PATIENT: 37 minutes.   POSSIBLE D/C IN 2-3 DAYS, DEPENDING ON CLINICAL CONDITION.   Gladstone Lighter M.D on 10/20/2016 at 2:58 PM  Between 7am to 6pm - Pager - [phone]  After 6pm go to [email or web address] - password [email or web address] [facility]  Office  [phone]  CC: Primary care physician; No PCP Per Patient

## 2016-10-21 ENCOUNTER — Ambulatory Visit: Admit: 2016-10-21 | Payer: Medicare Other | Admitting: Radiation Oncology

## 2016-10-21 ENCOUNTER — Inpatient Hospital Stay: Payer: Medicare Other

## 2016-10-21 DIAGNOSIS — N179 Acute kidney failure, unspecified: Secondary | ICD-10-CM

## 2016-10-21 DIAGNOSIS — C61 Malignant neoplasm of prostate: Secondary | ICD-10-CM

## 2016-10-21 LAB — BASIC METABOLIC PANEL
ANION GAP: 9 (ref 5–15)
BUN: 36 mg/dL — ABNORMAL HIGH (ref 6–20)
CALCIUM: 6.3 mg/dL — AB (ref 8.9–10.3)
CO2: 24 mmol/L (ref 22–32)
Chloride: 105 mmol/L (ref 101–111)
Creatinine, Ser: 3.86 mg/dL — ABNORMAL HIGH (ref 0.61–1.24)
GFR, EST AFRICAN AMERICAN: 16 mL/min — AB (ref >60)
GFR, EST NON AFRICAN AMERICAN: 14 mL/min — AB (ref >60)
Glucose, Bld: 99 mg/dL (ref 65–99)
Potassium: 3.6 mmol/L (ref 3.5–5.1)
Sodium: 138 mmol/L (ref 135–145)

## 2016-10-21 LAB — CBC
HEMATOCRIT: 23.1 % — AB (ref 40.0–52.0)
Hemoglobin: 7.6 g/dL — ABNORMAL LOW (ref 13.0–18.0)
MCH: 29.3 pg (ref 26.0–34.0)
MCHC: 33 g/dL (ref 32.0–36.0)
MCV: 88.9 fL (ref 80.0–100.0)
PLATELETS: 200 10*3/uL (ref 150–440)
RBC: 2.59 MIL/uL — ABNORMAL LOW (ref 4.40–5.90)
RDW: 14.5 % (ref 11.5–14.5)
WBC: 5.8 10*3/uL (ref 3.8–10.6)

## 2016-10-21 MED ORDER — HEPARIN SODIUM (PORCINE) 5000 UNIT/ML IJ SOLN
5000.0000 [IU] | Freq: Three times a day (TID) | INTRAMUSCULAR | Status: DC
  Administered 2016-10-21 – 2016-10-23 (×7): 5000 [IU] via SUBCUTANEOUS
  Filled 2016-10-21 (×7): qty 1

## 2016-10-21 MED ORDER — DEGARELIX ACETATE 120 MG ~~LOC~~ SOLR
240.0000 mg | Freq: Once | SUBCUTANEOUS | Status: AC
  Administered 2016-10-22: 240 mg via SUBCUTANEOUS
  Filled 2016-10-21: qty 6

## 2016-10-21 NOTE — Progress Notes (Signed)
Urology Consult Follow Up  Subjective: Patient doing well. Cr improved to 3.8 with 5.2 UOP. Bone scan shows diffuse osseous metastatic disease   Anti-infectives: Anti-infectives    Start     Dose/Rate Route Frequency Ordered Stop   10/17/16 1400  ceFAZolin (ANCEF) 1 GM/50ML IVPB    Comments:  Sharen Heck: cabinet override      10/17/16 1400 10/18/16 0159   10/17/16 1400  ceFAZolin (ANCEF) 1 GM/50ML IVPB    Comments:  Sharen Heck: cabinet override      10/17/16 1400 10/18/16 0159   10/17/16 1030  ceFAZolin (ANCEF) IVPB 2g/100 mL premix     2 g 200 mL/hr over 30 Minutes Intravenous To Radiology 10/17/16 0903 10/17/16 1450      Current Facility-Administered Medications  Medication Dose Route Frequency Provider Last Rate Last Dose  . 0.9 %  sodium chloride infusion   Intravenous Continuous Hillary Bow, MD 100 mL/hr at 10/21/16 0419    . acetaminophen (TYLENOL) tablet 650 mg  650 mg Oral Q6H PRN Lance Coon, MD   650 mg at 10/18/16 1621   Or  . acetaminophen (TYLENOL) suppository 650 mg  650 mg Rectal Q6H PRN Lance Coon, MD      . bisacodyl (DULCOLAX) EC tablet 10 mg  10 mg Oral Daily Gladstone Lighter, MD   10 mg at 10/21/16 1137  . fentaNYL (DURAGESIC - dosed mcg/hr) 12.5 mcg  12.5 mcg Transdermal Q72H Gladstone Lighter, MD   12.5 mcg at 10/20/16 1837  . HYDROcodone-acetaminophen (NORCO/VICODIN) 5-325 MG per tablet 1 tablet  1 tablet Oral Q6H PRN Hillary Bow, MD   1 tablet at 10/21/16 0955  . HYDROmorphone (DILAUDID) injection 1 mg  1 mg Intravenous Q4H PRN Gladstone Lighter, MD   1 mg at 10/20/16 1728  . lactulose (CHRONULAC) 10 GM/15ML solution 20 g  20 g Oral BID PRN Gladstone Lighter, MD      . metoprolol (LOPRESSOR) tablet 50 mg  50 mg Oral BID Lance Coon, MD   50 mg at 10/21/16 0951  . nitroGLYCERIN (NITROSTAT) SL tablet 0.4 mg  0.4 mg Sublingual Q5 min PRN Srikar Sudini, MD      . nystatin (MYCOSTATIN) 100000 UNIT/ML suspension 500,000 Units  5 mL  Mouth/Throat QID Gladstone Lighter, MD   500,000 Units at 10/21/16 0952  . ondansetron (ZOFRAN) tablet 4 mg  4 mg Oral Q6H PRN Lance Coon, MD       Or  . ondansetron [facility]) injection 4 mg  4 mg Intravenous Q6H PRN Lance Coon, MD   4 mg at 10/20/16 1055  . promethazine (PHENERGAN) injection 12.5 mg  12.5 mg Intravenous Q6H PRN Lance Coon, MD   12.5 mg at 10/20/16 1222  . senna-docusate (Senokot-S) tablet 2 tablet  2 tablet Oral BID Gladstone Lighter, MD   2 tablet at 10/21/16 0951     Objective: Vital signs in last 24 hours: Temp:  [98.5 F (36.9 C)-99.6 F (37.6 C)] 98.5 F (36.9 C) (12/21 0502) Pulse Rate:  [87-91] 88 (12/21 0941) Resp:  [18-20] 18 (12/21 0941) BP: (113-147)/(52-69) 119/66 (12/21 0941) SpO2:  [92 %-95 %] 94 % (12/21 0941) Weight:  [187 lb 1 oz (84.9 kg)] 187 lb 1 oz (84.9 kg) (12/21 0503)  Intake/Output from previous day: 12/20 0701 - 12/21 0700 In: 2586.7 [P.O.:480; I.V.:2066.7] Out: 5175 [Urine:5175] Intake/Output this shift: Total I/O In: 240 [P.O.:240] Out: 925 [Urine:925]   Physical Exam Constitutional: Well nourished. Alert and oriented, No acute distress. HEENT:   AT, moist mucus membranes. Trachea midline, no masses. Cardiovascular: No clubbing, cyanosis, or edema. Respiratory: Normal respiratory effort, no increased work of breathing. GI: Abdomen is soft, non tender, non distended, no abdominal masses. Liver and spleen not palpable.  No hernias appreciated.  Stool sample for occult testing is not indicated.   GU: No CVA tenderness.  No bladder fullness or masses.   PCN dressings are clean and dry, draining clear, yellow urine.   Skin: No rashes, bruises or suspicious lesions. Lymph: No cervical or inguinal adenopathy. Neurologic: Grossly intact, no focal deficits, moving all 4 extremities. Psychiatric: Normal mood and affect.  Lab Results:   Recent Labs  10/20/16 0418 10/21/16 0427  WBC 6.2 5.8  HGB 7.4* 7.6*  HCT 22.2* 23.1*   PLT 187 200   BMET  Recent Labs  10/20/16 0418 10/21/16 0427  NA 137 138  K 3.9 3.6  CL 105 105  CO2 24 24  GLUCOSE 100* 99  BUN 50* 36*  CREATININE 4.85* 3.86*  CALCIUM 6.2* 6.3*   PT/INR No results for input(s): LABPROT, INR in the last 72 hours. ABG No results for input(s): PHART, HCO3 in the last 72 hours.  Invalid input(s): PCO2, PO2  Studies/Results: Nm Bone Scan Whole Body  Result Date: 10/20/2016 CLINICAL DATA:  History of prostate carcinoma EXAM: NUCLEAR MEDICINE WHOLE BODY BONE SCAN TECHNIQUE: Whole body anterior and posterior images were obtained approximately 3 hours after intravenous injection of radiopharmaceutical. RADIOPHARMACEUTICALS:  23.85 mCi Technetium-70m MDP IV COMPARISON:  None. FINDINGS: Is adequate uptake of radioactive tracer throughout the bony skeleton. Multifocal areas of increased activity are identified throughout the pelvis, bilateral femurs, ribcage, right scapula, thoracic, cervical and lumbar spines consistent with metastatic disease. Some degenerative changes are noted in the feet. IMPRESSION: Diffuse metastatic disease. Electronically Signed   By: Inez Catalina M.D.   On: 10/20/2016 15:21     Assessment: Metastatic prostate cancer - await approval for firmagon  Acute renal failure - s/p bilateral nephrostomy tubes, draining well.  Renal function improving. Nephrology following.  Severe left hip pain - secondary to metastatic bony lesion.  Dr. Baruch Gouty saw patient and has outpt f/u appt scheduled for next week.   LOS: 5 days    [place] 10/21/2016

## 2016-10-21 NOTE — Progress Notes (Signed)
Critical calcium 6.3  .  Reviewed with Dr Pyreddi and no new orders received. Dorna Bloom RN

## 2016-10-21 NOTE — Care Management (Signed)
Submitted request for Firmagon 120mg  [place] x 2 to [employer]. Approved per Elmyra Ricks at [facility]. States an approval letter will be faxed to [phone]. Will take letter to Pharmacy at this facility, so our pharmacy can order medication. Shelbie Ammons RN MSN CCM Care Management

## 2016-10-21 NOTE — Care Management (Signed)
Telephone call to [facility] for update on Degarelix authorization. Spoke with Elmyra Ricks.  States that Degarelix is on on their formulary. The drug [place] Koller is on the formulary in 80 and 120 mg SQ. Need to call Medical Benefits number on back of customer's card. Telephone call to Medical Benefits number. Re-directed back to [phone]. Spoke with another representative. States the correct name and dosage of the requested medication would need to be resubmitted to [facility].  Faxed new request forms to [phone]. Spoke with Pharmacy representative for [facility]. States if requested medication is approved per [employer] today could get medication tomorrow. Next delivery would not be until Monday.  Update to Dr. Tressia Miners.  New request faxed to [facility] [phone] Requested information placed on chart. Will call for update tomorrow Shelbie Ammons RN MSN CCM Care Management

## 2016-10-21 NOTE — Progress Notes (Signed)
[facility]  ROUNDING NOTE   Subjective:  Renal function continues to improve. Creatinine down to 3.86. Patient still in significant bony pain.  Objective:  Vital signs in last 24 hours:  Temp:  [98.5 F (36.9 C)-99.6 F (37.6 C)] 98.7 F (37.1 C) (12/21 1344) Pulse Rate:  [75-91] 75 (12/21 1344) Resp:  [18-20] 18 (12/21 1344) BP: (110-147)/(52-69) 110/63 (12/21 1344) SpO2:  [92 %-98 %] 98 % (12/21 1344) Weight:  [84.9 kg (187 lb 1 oz)] 84.9 kg (187 lb 1 oz) (12/21 0503)  Weight change: -0.964 kg (-2 lb 2 oz) Filed Weights   10/19/16 0500 10/20/16 0334 10/21/16 0503  Weight: 85.6 kg (188 lb 11.2 oz) 85.8 kg (189 lb 3 oz) 84.9 kg (187 lb 1 oz)    Intake/Output: I/O last 3 completed shifts: In: 5526.7 [P.O.:680; I.V.:4786.7; Other:60] Out: 7095 [Urine:7095]   Intake/Output this shift:  Total I/O In: 960 [P.O.:240; I.V.:700; Other:20] Out: 1600 [Urine:1600]  Physical Exam: General: No acute distress  Head: Normocephalic, atraumatic. Moist oral mucosal membranes  Eyes: Anicteric  Neck: Supple, trachea midline  Lungs:  Clear to auscultation, normal effort  Heart: S1S2 no rubs  Abdomen:  Soft, nontender, bowel sounds present  Extremities: Trace peripheral edema.  Neurologic: Nonfocal, moving all four extremities  Skin: No lesions       Basic Metabolic Panel:  Recent Labs Lab 10/17/16 0510 10/18/16 0431 10/19/16 0427 10/20/16 0418 10/21/16 0427  NA 137 138 136 137 138  K 6.0* 4.9 4.3 3.9 3.6  CL 107 105 103 105 105  CO2 21* 23 24 24 24   GLUCOSE 112* 106* 126* 100* 99  BUN 72* 72* 58* 50* 36*  CREATININE 6.73* 7.25* 6.06* 4.85* 3.86*  CALCIUM 7.9* 7.2* 6.3* 6.2* 6.3*    Liver Function Tests:  Recent Labs Lab 10/16/16 1853 10/19/16 0427  AST 11*  --   ALT 8*  --   ALKPHOS 240*  --   BILITOT 0.3  --   PROT 6.9  --   ALBUMIN 3.5 2.8*    Recent Labs Lab 10/16/16 1853  LIPASE 29   No results for input(s): AMMONIA in the last 168  hours.  CBC:  Recent Labs Lab 10/16/16 1853 10/17/16 0510 10/19/16 0427 10/20/16 0418 10/21/16 0427  WBC 4.9 6.7 6.8 6.2 5.8  NEUTROABS 3.0  --  5.2 4.6  --   HGB 7.9* 8.5* 8.1* 7.4* 7.6*  HCT 23.6* 25.9* 24.4* 22.2* 23.1*  MCV 89.3 89.9 90.6 89.4 88.9  PLT 239 242 184 187 200    Cardiac Enzymes:  Recent Labs Lab 10/16/16 1853  TROPONINI <0.03    BNP: Invalid input(s): POCBNP  CBG: No results for input(s): GLUCAP in the last 168 hours.  Microbiology: No results found for this or any previous visit.  Coagulation Studies: No results for input(s): LABPROT, INR in the last 72 hours.  Urinalysis: No results for input(s): COLORURINE, LABSPEC, PHURINE, GLUCOSEU, HGBUR, BILIRUBINUR, KETONESUR, PROTEINUR, UROBILINOGEN, NITRITE, LEUKOCYTESUR in the last 72 hours.  Invalid input(s): APPERANCEUR    Imaging: Nm Bone Scan Whole Body  Result Date: 10/20/2016 CLINICAL DATA:  History of prostate carcinoma EXAM: NUCLEAR MEDICINE WHOLE BODY BONE SCAN TECHNIQUE: Whole body anterior and posterior images were obtained approximately 3 hours after intravenous injection of radiopharmaceutical. RADIOPHARMACEUTICALS:  23.85 mCi Technetium-81m MDP IV COMPARISON:  None. FINDINGS: Is adequate uptake of radioactive tracer throughout the bony skeleton. Multifocal areas of increased activity are identified throughout the pelvis, bilateral femurs, ribcage,  right scapula, thoracic, cervical and lumbar spines consistent with metastatic disease. Some degenerative changes are noted in the feet. IMPRESSION: Diffuse metastatic disease. Electronically Signed   By: Inez Catalina M.D.   On: 10/20/2016 15:21     Medications:   . sodium chloride 100 mL/hr at 10/21/16 1353   . bisacodyl  10 mg Oral Daily  . [START ON 10/22/2016] degarelix  240 mg Subcutaneous Once  . fentaNYL  12.5 mcg Transdermal Q72H  . heparin subcutaneous  5,000 Units Subcutaneous Q8H  . metoprolol  50 mg Oral BID  . nystatin  5  mL Mouth/Throat QID  . senna-docusate  2 tablet Oral BID   acetaminophen **OR** acetaminophen, HYDROcodone-acetaminophen, HYDROmorphone (DILAUDID) injection, lactulose, nitroGLYCERIN, ondansetron **OR** ondansetron (ZOFRAN) IV, promethazine  Assessment/ Plan:  81 y.o. male with medical problems of coronary disease MI in 2014, angioplasty with stent placement, hypertension, enlarged prostate, who was admitted to [facility] on 10/16/2016 for evaluation of acute onset low back pain. Upon presentation, his creatinine was found to be 6.69.  Only known prior creatinine is 0.94 from January 2014.   1.  Acute renal failure due to obstuctive uropathy. 2.  Bilateral hydronephrosis. 3.  Hyperkalemia. 4.  Anemia not otherwise specified. 5.  Metabolic acidosis, improved 6.  Suspected prostate cancer.  Plan:  Renal function continues to improve daily.  Creatinine currently down to 3.86.  Nephrostomy is appear to be draining well.  Continue to monitor renal parameters daily for now.  Serum electrolytes are also acceptable.  Pain management per hospitalist.    LOS: 5 Colan Laymon 12/21/20173:53 PM

## 2016-10-21 NOTE — Progress Notes (Signed)
[facility] [place] NAME: [place] Gould    MR#:  [name]  DATE OF BIRTH:  26-Feb-1935  SUBJECTIVE:  CHIEF COMPLAINT:   Chief Complaint  Patient presents with  . Back Pain   - Patient admitted with acute urinary retention resulting in acute renal failure secondary to bladder obstruction resulting in bilateral hydronephrosis. -has bilateral nephrostomy tubes, renal function improving - Significant bony metastases from clinical diagnosis of prostate cancer.  REVIEW OF SYSTEMS:  Review of Systems  Constitutional: Positive for malaise/fatigue. Negative for chills, fever and weight loss.  HENT: Negative for ear discharge, ear pain, hearing loss and tinnitus.   Eyes: Negative for blurred vision, double vision and photophobia.  Respiratory: Negative for cough, hemoptysis, shortness of breath and wheezing.   Cardiovascular: Negative for chest pain, palpitations, orthopnea and leg swelling.  Gastrointestinal: Negative for abdominal pain, constipation, diarrhea, heartburn, melena, nausea and vomiting.  Genitourinary: Negative for dysuria.  Musculoskeletal: Positive for back pain and myalgias. Negative for neck pain.  Skin: Negative for rash.  Neurological: Negative for dizziness, sensory change, speech change, focal weakness and headaches.  Endo/Heme/Allergies: Does not bruise/bleed easily.  Psychiatric/Behavioral: Negative for depression.    DRUG ALLERGIES:  Not on File  VITALS:  Blood pressure 110/63, pulse 75, temperature 98.7 F (37.1 C), temperature source Oral, resp. rate 18, height 5\' 10"  (1.778 m), weight 84.9 kg (187 lb 1 oz), SpO2 98 %.  PHYSICAL EXAMINATION:  Physical Exam  GENERAL:  81 y.o.-year-old patient lying in the bed with no acute distress.  EYES: Pupils equal, round, reactive to light and accommodation. No scleral icterus. Extraocular muscles intact.  HEENT: Head atraumatic, normocephalic. Oropharynx and  nasopharynx clear.  NECK:  Supple, no jugular venous distention. No thyroid enlargement, no tenderness.  LUNGS: Normal breath sounds bilaterally, no wheezing, rales,rhonchi or crepitation. No use of accessory muscles of respiration.  CARDIOVASCULAR: S1, S2 normal. No murmurs, rubs, or gallops.  ABDOMEN: Soft, nontender, nondistended. Bowel sounds present. No organomegaly or mass. Bilateral nephrostomy tubes in place with clear urine draining EXTREMITIES: No pedal edema, cyanosis, or clubbing.  Significant pain on turning towards the right side. NEUROLOGIC: Cranial nerves II through XII are intact. Muscle strength 5/5 in all extremities. Sensation intact. Gait not checked.  PSYCHIATRIC: The patient is alert and oriented x 3.  SKIN: No obvious rash, lesion, or ulcer.    LABORATORY PANEL:   CBC  Recent Labs Lab 10/21/16 0427  WBC 5.8  HGB 7.6*  HCT 23.1*  PLT 200   ------------------------------------------------------------------------------------------------------------------  Chemistries   Recent Labs Lab 10/16/16 1853  10/21/16 0427  NA 136  < > 138  K 5.6*  < > 3.6  CL 108  < > 105  CO2 16*  < > 24  GLUCOSE 96  < > 99  BUN 77*  < > 36*  CREATININE 6.69*  < > 3.86*  CALCIUM 7.7*  < > 6.3*  AST 11*  --   --   ALT 8*  --   --   ALKPHOS 240*  --   --   BILITOT 0.3  --   --   < > = values in this interval not displayed. ------------------------------------------------------------------------------------------------------------------  Cardiac Enzymes  Recent Labs Lab 10/16/16 1853  TROPONINI <0.03   ------------------------------------------------------------------------------------------------------------------  RADIOLOGY:  Nm Bone Scan Whole Body  Result Date: 10/20/2016 CLINICAL DATA:  History of prostate carcinoma EXAM: NUCLEAR MEDICINE WHOLE BODY BONE SCAN TECHNIQUE: Whole body  anterior and posterior images were obtained approximately 3 hours after  intravenous injection of radiopharmaceutical. RADIOPHARMACEUTICALS:  23.85 mCi Technetium-21m MDP IV COMPARISON:  None. FINDINGS: Is adequate uptake of radioactive tracer throughout the bony skeleton. Multifocal areas of increased activity are identified throughout the pelvis, bilateral femurs, ribcage, right scapula, thoracic, cervical and lumbar spines consistent with metastatic disease. Some degenerative changes are noted in the feet. IMPRESSION: Diffuse metastatic disease. Electronically Signed   By: Inez Catalina M.D.   On: 10/20/2016 15:21    EKG:   Orders placed or performed in visit on 11/05/12  . EKG 12-Lead  . EKG 12-Lead  . EKG 12-Lead  . EKG 12-Lead    ASSESSMENT AND PLAN:   81 year old male with past medical history significant for coronary artery disease and hypertension admitted to the hospital with back pain and noted to have bilateral hydronephrosis from an obstructing bladder mass.  #1 acute renal failure-obstructive nephropathy -CT abdomen with obstructing bladder mass, likely metastatic from prostrate. Has bilateral nephrostomy tubes placed in. Now draining clear urine. -Creatinine improved from 7 to 3.8 today -Monitor urine output. Appreciate nephrology consult  #2 clinical diagnosis of prostate cancer- enlarged nodular prostrate, PSA greater than 400. Clinical diagnosis of prostate cancer-metastatic. -Urology consult appreciated. No need for prostrate biopsy per urology - recommended use of 1 time dose of 240 mg SQ firmagon- patient will respond within 24hrs and f/u dose in the office in 1 month- appreciate urology input - awaiting insurance approval for the drug  #3 bone pain-significant bone pain secondary to osseous metastatic sepsis. Bone scan with diffuse metastases. Radiation oncology consulted for possible palliative radiation. Will be started next week Adjust pain medication. Started fentanyl patch  #4 hypertension-on metoprolol twice a day  #5 anemia of  chronic disease- Baseline hemoglobin around 8, steadily dropping. Hemoglobin at 7.6 today -Closely monitor. Transfuse if hemoglobin less than 7  #6 DVT prophylaxis- SQ heparin. Monitor if anemia worsens- discontinue  #7 constipation-secondary to pain medications. We'll adjust pain medications   Physical therapy consulted. SNF at discharge   All the records are reviewed and case discussed with Care Management/Social Workerr. Management plans discussed with the patient, family and they are in agreement.  CODE STATUS: DO NOT RESUSCITATE  TOTAL TIME TAKING CARE OF THIS PATIENT: 37 minutes.   POSSIBLE D/C IN 2-3 DAYS, DEPENDING ON CLINICAL CONDITION.   Gladstone Lighter M.D on 10/21/2016 at 3:09 PM  Between 7am to 6pm - Pager - [phone]  After 6pm go to [email or web address] - password [email or web address] [facility]  Office  [phone]  CC: Primary care physician; No PCP Per Patient

## 2016-10-21 NOTE — Consult Note (Signed)
NEW PATIENT EVALUATION  Name: Johnathan Gould  MRN: [name]  Date:   10/16/2016     DOB: [birth year]   This 81 y.o. male patient presents to the clinic for initial evaluation of stage IV prostate cancer admitted with significant left hip pain.  REFERRING PHYSICIAN: No ref. provider found  CHIEF COMPLAINT:  Chief Complaint  Patient presents with  . Back Pain    DIAGNOSIS: The primary encounter diagnosis was Bladder mass. Diagnoses of Acute renal failure, unspecified acute renal failure type ([place]), Hyperkalemia, Hydronephrosis, Prostate cancer ([place]), Muscle weakness (generalized), Difficulty in walking, not elsewhere classified, Pain in left hip, and Unsteadiness on feet were also pertinent to this visit.   PREVIOUS INVESTIGATIONS:  Bone scan reviewed Clinical notes reviewed  HPI: Patient is a 81 year old male admitted for renal failure as well as uncontrollable left hip pain. His PSA on admission was 469 with a grossly abnormal rectal exam consistent with prostate cancer. He also had acute renal failure and is status post bilateral nephrostomy tube placement this week. He underwent a bone scan showing widespread diffuse metastatic disease with involvement of the left hip SI joints and L5. Patient has been recommended to startDegarelix. He is seen today as an inpatient accompanied by family members including his wife and son. He still is in significant pain. I've asked to evaluate him for possible palliative radiation therapy to his left hip.  PLANNED TREATMENT REGIMEN: Palliative ration therapy to left hip and possible Xofigo  PAST MEDICAL HISTORY:  has a past medical history of CAD (coronary artery disease); Hypertension; and RAD (reactive airway disease).    PAST SURGICAL HISTORY:  Past Surgical History:  Procedure Laterality Date  . CORONARY ANGIOPLASTY WITH STENT PLACEMENT    . IR GENERIC HISTORICAL  10/17/2016   IR NEPHROSTOMY PLACEMENT RIGHT 10/17/2016 [facility]-INTERV RAD  .  IR GENERIC HISTORICAL  10/17/2016   IR NEPHROSTOMY PLACEMENT LEFT 10/17/2016 [name], MD [facility]-INTERV RAD  . KNEE SURGERY Left 2006   Laparascopy done on left knee, scar tissue taken out    FAMILY HISTORY: family history includes Hypertension in his other.  SOCIAL HISTORY:  reports that he has never smoked. He has never used smokeless tobacco. He reports that he does not drink alcohol or use drugs.  ALLERGIES: Patient has no allergy information on record.  MEDICATIONS:  Current Facility-Administered Medications  Medication Dose Route Frequency Provider Last Rate Last Dose  . 0.9 %  sodium chloride infusion   Intravenous Continuous Hillary Bow, MD 100 mL/hr at 10/21/16 1353    . acetaminophen (TYLENOL) tablet 650 mg  650 mg Oral Q6H PRN Lance Coon, MD   650 mg at 10/18/16 1621   Or  . acetaminophen (TYLENOL) suppository 650 mg  650 mg Rectal Q6H PRN Lance Coon, MD      . bisacodyl (DULCOLAX) EC tablet 10 mg  10 mg Oral Daily Gladstone Lighter, MD   10 mg at 10/21/16 1137  . [START ON 10/22/2016] degarelix (FIRMAGON) injection 240 mg  240 mg Subcutaneous Once Gladstone Lighter, MD      . fentaNYL ([place] - dosed mcg/hr) 12.5 mcg  12.5 mcg Transdermal Q72H Gladstone Lighter, MD   12.5 mcg at 10/20/16 1837  . heparin injection 5,000 Units  5,000 Units Subcutaneous Q8H Gladstone Lighter, MD      . HYDROcodone-acetaminophen (NORCO/VICODIN) 5-325 MG per tablet 1 tablet  1 tablet Oral Q6H PRN Hillary Bow, MD   1 tablet at 10/21/16 0955  . HYDROmorphone (DILAUDID)  injection 1 mg  1 mg Intravenous Q4H PRN Gladstone Lighter, MD   1 mg at 10/21/16 1500  . lactulose (CHRONULAC) 10 GM/15ML solution 20 g  20 g Oral BID PRN Gladstone Lighter, MD      . metoprolol (LOPRESSOR) tablet 50 mg  50 mg Oral BID Lance Coon, MD   50 mg at 10/21/16 0951  . nitroGLYCERIN (NITROSTAT) SL tablet 0.4 mg  0.4 mg Sublingual Q5 min PRN Srikar Sudini, MD      . nystatin (MYCOSTATIN) 100000 UNIT/ML  suspension 500,000 Units  5 mL Mouth/Throat QID Gladstone Lighter, MD   500,000 Units at 10/21/16 1503  . ondansetron (ZOFRAN) tablet 4 mg  4 mg Oral Q6H PRN Lance Coon, MD       Or  . ondansetron [facility]) injection 4 mg  4 mg Intravenous Q6H PRN Lance Coon, MD   4 mg at 10/20/16 1055  . promethazine (PHENERGAN) injection 12.5 mg  12.5 mg Intravenous Q6H PRN Lance Coon, MD   12.5 mg at 10/20/16 1222  . senna-docusate (Senokot-S) tablet 2 tablet  2 tablet Oral BID Gladstone Lighter, MD   2 tablet at 10/21/16 0951    ECOG PERFORMANCE STATUS:  3 - Symptomatic, >50% confined to bed  REVIEW OF SYSTEMS: Except for the significant left hip pain Patient denies any weight loss, fatigue, weakness, fever, chills or night sweats. Patient denies any loss of vision, blurred vision. Patient denies any ringing  of the ears or hearing loss. No irregular heartbeat. Patient denies heart murmur or history of fainting. Patient denies any chest pain or pain radiating to her upper extremities. Patient denies any shortness of breath, difficulty breathing at night, cough or hemoptysis. Patient denies any swelling in the lower legs. Patient denies any nausea vomiting, vomiting of blood, or coffee ground material in the vomitus. Patient denies any stomach pain. Patient states has had normal bowel movements no significant constipation or diarrhea. Patient denies any dysuria, hematuria or significant nocturia. Patient denies any problems walking, swelling in the joints or loss of balance. Patient denies any skin changes, loss of hair or loss of weight. Patient denies any excessive worrying or anxiety or significant depression. Patient denies any problems with insomnia. Patient denies excessive thirst, polyuria, polydipsia. Patient denies any swollen glands, patient denies easy bruising or easy bleeding. Patient denies any recent infections, allergies or URI. Patient "s visual fields have not changed significantly in recent  time.    PHYSICAL EXAM: BP 110/63 (BP Location: Right Arm)   Pulse 75   Temp 98.7 F (37.1 C) (Oral)   Resp 18   Ht 5\' 10"  (1.778 m)   Wt 187 lb 1 oz (84.9 kg)   SpO2 98%   BMI 26.84 kg/m  Well-developed male in NAD. Patient is in significant pain any range of motion left extremity elicits pain. Proprioception is intact motor sensory and DTR levels are equal and symmetric in the lower extremity is bilaterally. Well-developed well-nourished patient in NAD. HEENT reveals PERLA, EOMI, discs not visualized.  Oral cavity is clear. No oral mucosal lesions are identified. Neck is clear without evidence of cervical or supraclavicular adenopathy. Lungs are clear to A&P. Cardiac examination is essentially unremarkable with regular rate and rhythm without murmur rub or thrill. Abdomen is benign with no organomegaly or masses noted. Motor sensory and DTR levels are equal and symmetric in the upper and lower extremities. Cranial nerves II through XII are grossly intact. Proprioception is intact. No peripheral adenopathy or  edema is identified. No motor or sensory levels are noted. Crude visual fields are within normal range.  LABORATORY DATA: No pathology report for review    RADIOLOGY RESULTS: Bone scan is reviewed and compatible above-stated findings   IMPRESSION: Stage IV widespread metastatic prostate cancer in 81 year old male   PLAN: At the present time I like to offer palliative radiation therapy to his left hip. This is a large area to radiate and would plan on delivering 3000 cGy in 10 fractions rather than a single fraction radiation. I would include his left hip SI joints and L5 vertebral body based on the significant uptake on bone scan. Patient also after completion of palliative external beam radiation therapy may be a candidate forXofigo. Risks and benefits of palliative radiation therapy were discussed with the family they all seem to comprehend my treatment plan well. I have personally  ordered CT simulation for early next week.  I would like to take this opportunity to thank you for allowing me to participate in the care of your patient.Armstead Peaks., MD

## 2016-10-21 NOTE — Plan of Care (Signed)
Problem: Health Behavior/Discharge Planning: Goal: Ability to manage health-related needs will improve Outcome: Progressing Per family.  Instructed on how to empty nephrostomy bags.  Problem: Pain Managment: Goal: General experience of comfort will improve Outcome: Progressing Pt mildly sedated with fentanyl patch but more comfortable.  Problem: Bowel/Gastric: Goal: Will not experience complications related to bowel motility Outcome: Not Progressing In order to allow pt to sleep, no laxative was given this evening.  Will give in morning when pt more awake.

## 2016-10-22 DIAGNOSIS — N179 Acute kidney failure, unspecified: Secondary | ICD-10-CM

## 2016-10-22 DIAGNOSIS — C61 Malignant neoplasm of prostate: Secondary | ICD-10-CM

## 2016-10-22 LAB — BASIC METABOLIC PANEL
ANION GAP: 9 (ref 5–15)
BUN: 29 mg/dL — ABNORMAL HIGH (ref 6–20)
CALCIUM: 6.4 mg/dL — AB (ref 8.9–10.3)
CO2: 25 mmol/L (ref 22–32)
CREATININE: 3.02 mg/dL — AB (ref 0.61–1.24)
Chloride: 104 mmol/L (ref 101–111)
GFR, EST AFRICAN AMERICAN: 21 mL/min — AB (ref >60)
GFR, EST NON AFRICAN AMERICAN: 18 mL/min — AB (ref >60)
GLUCOSE: 93 mg/dL (ref 65–99)
Potassium: 3.4 mmol/L — ABNORMAL LOW (ref 3.5–5.1)
Sodium: 138 mmol/L (ref 135–145)

## 2016-10-22 LAB — CBC
HCT: 22.3 % — ABNORMAL LOW (ref 40.0–52.0)
Hemoglobin: 7.4 g/dL — ABNORMAL LOW (ref 13.0–18.0)
MCH: 29.4 pg (ref 26.0–34.0)
MCHC: 33.2 g/dL (ref 32.0–36.0)
MCV: 88.7 fL (ref 80.0–100.0)
PLATELETS: 222 10*3/uL (ref 150–440)
RBC: 2.52 MIL/uL — ABNORMAL LOW (ref 4.40–5.90)
RDW: 14.2 % (ref 11.5–14.5)
WBC: 5.3 10*3/uL (ref 3.8–10.6)

## 2016-10-22 NOTE — Clinical Social Work Note (Signed)
Clinical Social Work Assessment  Patient Details  Name: Johnathan Gould MRN: [id number] Date of Birth: 1935/09/26  Date of referral:  10/22/16               Reason for consult:  Facility Placement                Permission sought to share information with:  Chartered certified accountant granted to share information::     Name::      Johnathan Gould::   [place]   Relationship::     Contact Information:     Housing/Transportation Living arrangements for the past 2 months:  [place] of Information:  Patient, Spouse Patient Interpreter Needed:  None Criminal Activity/Legal Involvement Pertinent to Current Situation/Hospitalization:  No - Comment as needed Significant Relationships:  Adult Children, Spouse Lives with:  Spouse Do you feel safe going back to the place where you live?  Yes Need for family participation in patient care:  Yes (Comment)  Care giving concerns:  Patient lives in [place] with his wife John[place] [phone].    Social Worker assessment / plan:  Holiday representative (CSW) received SNF consult. PT is recommending SNF. Per MD patient has nephrostomy tubes and has an apportionment on Wednesday to start radiation. CSW met with patient and his wife Johnathan Gould was at bedside. CSW introduced self and explained role of CSW department. Patient reported that she lives in [place] with his wife. Per wife they have 2 adult sons that provide support and live near by. CSW explained SNF process. CSW explained the benefits of SNF and the difference between SNF and home health. Patient declined SNF and reported that he wants to go home. Wife is agreeable for patient to come home. Patient and wife are interested in home health. RN case manager aware of above. Please reconsult if future social work needs arise. CSW signing off.   Employment status:  Retired Nurse, adult PT Recommendations:  [place] / Referral to community resources:  [place]  Patient/Family's Response to care:  Patient declined SNF.   Patient/Family's Understanding of and Emotional Response to Diagnosis, Current Treatment, and Prognosis:  Patient and wife were very pleasant and thanked CSW for assistance.   Emotional Assessment Appearance:  Appears stated age Attitude/Demeanor/Rapport:    Affect (typically observed):  Accepting, Adaptable, Pleasant Orientation:  Oriented to Self, Oriented to Place, Oriented to  Time, Oriented to Situation Alcohol / Substance use:  Not Applicable Psych involvement (Current and /or in the community):  No (Comment)  Discharge Needs  Concerns to be addressed:  Discharge Planning Concerns Readmission within the last 30 days:  No Current discharge risk:  Dependent with Mobility Barriers to Discharge:  Continued Medical Work up   [employer], Johnathan Beets, LCSW 10/22/2016, 4:30 PM

## 2016-10-22 NOTE — Progress Notes (Signed)
[facility] [facility] [place] NAME: Johnathan Gould    MR#:  [id number]  DATE OF BIRTH:  [birth year]  SUBJECTIVE:  CHIEF COMPLAINT:   Chief Complaint  Patient presents with  . Back Pain   - Patient admitted with acute urinary retention resulting in acute renal failure secondary to bladder obstruction resulting in bilateral hydronephrosis. -has bilateral nephrostomy tubes, renal function improving - Significant bony metastases from clinical diagnosis of prostate cancer.  REVIEW OF SYSTEMS:  Review of Systems  Constitutional: Positive for malaise/fatigue. Negative for chills, fever and weight loss.  HENT: Negative for ear discharge, ear pain, hearing loss and tinnitus.   Eyes: Negative for blurred vision, double vision and photophobia.  Respiratory: Negative for cough, hemoptysis, shortness of breath and wheezing.   Cardiovascular: Negative for chest pain, palpitations, orthopnea and leg swelling.  Gastrointestinal: Negative for abdominal pain, constipation, diarrhea, heartburn, melena, nausea and vomiting.  Genitourinary: Negative for dysuria.  Musculoskeletal: Positive for back pain and myalgias. Negative for neck pain.  Skin: Negative for rash.  Neurological: Negative for dizziness, sensory change, speech change, focal weakness and headaches.  Endo/Heme/Allergies: Does not bruise/bleed easily.  Psychiatric/Behavioral: Negative for depression.    DRUG ALLERGIES:  Not on File  VITALS:  Blood pressure 113/64, pulse 75, temperature 98.8 F (37.1 C), temperature source Oral, resp. rate 20, height 5\' 10"  (1.778 m), weight 83.4 kg (183 lb 14.4 oz), SpO2 95 %.  PHYSICAL EXAMINATION:  Physical Exam  GENERAL:  81 y.o.-year-old patient lying in the bed with no acute distress.  EYES: Pupils equal, round, reactive to light and accommodation. No scleral icterus. Extraocular muscles intact.  HEENT: Head atraumatic, normocephalic. Oropharynx and  nasopharynx clear.  NECK:  Supple, no jugular venous distention. No thyroid enlargement, no tenderness.  LUNGS: Normal breath sounds bilaterally, no wheezing, rales,rhonchi or crepitation. No use of accessory muscles of respiration.  CARDIOVASCULAR: S1, S2 normal. No murmurs, rubs, or gallops.  ABDOMEN: Soft, nontender, nondistended. Bowel sounds present. No organomegaly or mass. Bilateral nephrostomy tubes in place with clear urine draining EXTREMITIES: No pedal edema, cyanosis, or clubbing.  Significant pain on turning towards the right side. NEUROLOGIC: Cranial nerves II through XII are intact. Muscle strength 5/5 in all extremities. Sensation intact. Gait not checked.  PSYCHIATRIC: The patient is alert and oriented x 3.  SKIN: No obvious rash, lesion, or ulcer.    LABORATORY PANEL:   CBC  Recent Labs Lab 10/22/16 0430  WBC 5.3  HGB 7.4*  HCT 22.3*  PLT 222   ------------------------------------------------------------------------------------------------------------------  Chemistries   Recent Labs Lab 10/16/16 1853  10/22/16 0430  NA 136  < > 138  K 5.6*  < > 3.4*  CL 108  < > 104  CO2 16*  < > 25  GLUCOSE 96  < > 93  BUN 77*  < > 29*  CREATININE 6.69*  < > 3.02*  CALCIUM 7.7*  < > 6.4*  AST 11*  --   --   ALT 8*  --   --   ALKPHOS 240*  --   --   BILITOT 0.3  --   --   < > = values in this interval not displayed. ------------------------------------------------------------------------------------------------------------------  Cardiac Enzymes  Recent Labs Lab 10/16/16 1853  TROPONINI <0.03   ------------------------------------------------------------------------------------------------------------------  RADIOLOGY:  No results found.  EKG:   Orders placed or performed in visit on 11/05/12  . EKG 12-Lead  . EKG 12-Lead  . EKG  12-Lead  . EKG 12-Lead    ASSESSMENT AND PLAN:   81 year old male with past medical history significant for coronary  artery disease and hypertension admitted to the hospital with back pain and noted to have bilateral hydronephrosis from an obstructing bladder mass.  #1 acute renal failure-obstructive nephropathy -CT abdomen with obstructing bladder mass, likely metastatic from prostrate. Has bilateral nephrostomy tubes placed in. Now draining clear urine. -Creatinine improved from 7 to 3.0 today -Monitor urine output. Appreciate nephrology consult  #2 clinical diagnosis of prostate cancer- enlarged nodular prostrate, PSA greater than 400. Clinical diagnosis of prostate cancer-metastatic. -Urology consult appreciated. No need for prostrate biopsy per urology - recommended use of 1 time dose of 240 mg SQ firmagon (recieved today 10/22/16) patient will respond within 24hrs and f/u dose in the office in 1 month- appreciate urology input  #3 bone pain-significant bone pain secondary to osseous metastatic sepsis. Bone scan with diffuse metastases. Radiation oncology consulted for possible palliative radiation. Will be started next week Adjust pain medication. Started fentanyl patch  #4 hypertension-on metoprolol twice a day  #5 anemia of chronic disease- Baseline hemoglobin around 8, steadily dropping. Hemoglobin at 7.6 today -Closely monitor. Transfuse if hemoglobin less than 7  #6 DVT prophylaxis- SQ heparin. Monitor if anemia worsens- discontinue  #7 constipation-secondary to pain medications. We'll adjust pain medications   Physical therapy consulted. SNF at discharge   All the records are reviewed and case discussed with Care Management/Social Workerr. Management plans discussed with the patient, family and they are in agreement.  CODE STATUS: DO NOT RESUSCITATE  TOTAL TIME TAKING CARE OF THIS PATIENT: 30 minutes.   POSSIBLE D/C IN 2-3 DAYS, DEPENDING ON CLINICAL CONDITION.   Labron Bloodgood M.D on 10/22/2016 at 3:43 PM  Between 7am to 6pm - Pager - [phone]  After 6pm go to [email or web address] -  password [email or web address] [facility]  Office  [phone]  CC: Primary care physician; No PCP Per Patient

## 2016-10-22 NOTE — Progress Notes (Signed)
Physical Therapy Treatment Patient Details Name: Johnathan Gould MRN: [id number] DOB: 20-Nov-1934 Today's Date: 10/22/2016    History of Present Illness Pt admitted for complaints of back and L hip pain. Pt then noted with bladder mass, now diagnosed with prostate cancer with metastatic bony lesions. Pt with history of HTN and CAD. Pt underwent procedure on 12/17 for B nephrostomy placement. DUe to diffuse metastatic disease, pt will now be undergoing palliative radiation for pain.    PT Comments    Pt is making good progress towards goals and is motivated to perform therapy. Pt with improved balance this date using RW, encouraged to continue use at home for safety. Pt ultimately would like to dc home, is borderline able to dc home at this time, however still recommend SNF this date secondary to limited ambulation distance and assist needed for OOB mobility. Discussed with family.  Follow Up Recommendations  SNF     Equipment Recommendations       Recommendations for Other Services       Precautions / Restrictions Precautions Precautions: Fall Restrictions Weight Bearing Restrictions: No    Mobility  Bed Mobility Overal bed mobility: Needs Assistance Bed Mobility: Supine to Sit     Supine to sit: Min guard     General bed mobility comments: safe technique performed with cues given for sequencing. Once seated at EOB, pt able to sit with supervision.  Needs help assisting with lines/leads/tubes  Transfers Overall transfer level: Needs assistance Equipment used: Rolling walker (2 wheeled) Transfers: Sit to/from Stand Sit to Stand: Min assist         General transfer comment: assist for upright posture and cues for pushing from seated surface. Safe technique using RW. IMproved steadiness noted with RW this session.  Ambulation/Gait Ambulation/Gait assistance: Min guard Ambulation Distance (Feet): 40 Feet Assistive device: Rolling walker (2 wheeled) Gait  Pattern/deviations: Step-through pattern     General Gait Details: very slow gait speed noted secondary to pain. Cues given for sequencing and keeping safe distance from RW. Improved reciprocal gait pattern this session.   Stairs            Wheelchair Mobility    Modified Rankin (Stroke Patients Only)       Balance                                    Cognition Arousal/Alertness: Awake/alert Behavior During Therapy: WFL for tasks assessed/performed Overall Cognitive Status: Within Functional Limits for tasks assessed                      Exercises      General Comments        Pertinent Vitals/Pain Pain Assessment: Faces Faces Pain Scale: Hurts little more Pain Location: L hip/knee Pain Descriptors / Indicators: Discomfort;Dull Pain Intervention(s): Limited activity within patient's tolerance    Home Living                      Prior Function            PT Goals (current goals can now be found in the care plan section) Acute Rehab PT Goals Patient Stated Goal: to get stronger PT Goal Formulation: With patient Time For Goal Achievement: 11/03/16 Potential to Achieve Goals: Good Progress towards PT goals: Progressing toward goals    Frequency    Min 2X/week  PT Plan Current plan remains appropriate    Co-evaluation             End of Session Equipment Utilized During Treatment: Gait belt Activity Tolerance: Patient tolerated treatment well Patient left: in chair;with chair alarm set     Time: [id number] PT Time Calculation (min) (ACUTE ONLY): 23 min  Charges:  $Gait Training: 8-22 mins $Therapeutic Exercise: 8-22 mins                    G Codes:      Osceola Holian Nov 21, 2016, 12:52 PM Greggory Stallion, PT, DPT [phone]

## 2016-10-22 NOTE — NC FL2 (Signed)
[place] LEVEL OF CARE SCREENING TOOL     IDENTIFICATION  Patient Name: Johnathan Gould Birthdate: 01-Jun-1935 Sex: male Admission Date (Current Location): 10/16/2016  [place] and [place] Number:  Engineering geologist and Address:  [facility], [address], [place], [place] [address]      Provider Number: [id number]  Attending Physician Name and Address:  Fritzi Mandes, MD  Relative Name and Phone Number:       Current Level of Care: Hospital Recommended Level of Care: [place] Prior Approval Number:    Date Approved/Denied:   PASRR Number:  ([id number])  Discharge Plan: SNF    Current Diagnoses: Patient Active Problem List   Diagnosis Date Noted  . Prostate cancer ([place])   . Urinary obstruction 10/16/2016  . Acute renal failure (ARF) ([place]) 10/16/2016  . Bladder mass 10/16/2016  . CAD (coronary artery disease) 10/16/2016  . HTN (hypertension) 10/16/2016    Orientation RESPIRATION BLADDER Height & Weight     Self, Time, Situation, Place  Normal Continent Weight: 183 lb 14.4 oz (83.4 kg) Height:  5\' 10"  (177.8 cm)  BEHAVIORAL SYMPTOMS/MOOD NEUROLOGICAL BOWEL NUTRITION STATUS   (none)  (none) Continent Diet (Diet: Heart Healthy )  AMBULATORY STATUS COMMUNICATION OF NEEDS Skin   Extensive Assist Verbally Other (Comment) (Nephrostomy Tubes)                       Personal Care Assistance Level of Assistance  Bathing, Feeding, Dressing Bathing Assistance: Limited assistance Feeding assistance: Independent Dressing Assistance: Limited assistance     Functional Limitations Info  Sight, Hearing, Speech Sight Info: Adequate Hearing Info: Impaired Speech Info: Adequate    SPECIAL CARE FACTORS FREQUENCY  PT (By licensed PT), OT (By licensed OT)     PT Frequency:  (5) OT Frequency:  (5)            Contractures      Additional Factors Info  Code Status, Allergies Code Status Info:  (DNR  ) Allergies Info:  (Not on File )           Current Medications (10/22/2016):  This is the current hospital active medication list Current Facility-Administered Medications  Medication Dose Route Frequency Provider Last Rate Last Dose  . 0.9 %  sodium chloride infusion   Intravenous Continuous Srikar Sudini, MD 100 mL/hr at 10/22/16 [id number]    . acetaminophen (TYLENOL) tablet 650 mg  650 mg Oral Q6H PRN Lance Coon, MD   650 mg at 10/18/16 1621   Or  . acetaminophen (TYLENOL) suppository 650 mg  650 mg Rectal Q6H PRN Lance Coon, MD      . fentaNYL ([place] - dosed mcg/hr) 12.5 mcg  12.5 mcg Transdermal Q72H Gladstone Lighter, MD   12.5 mcg at 10/20/16 1837  . heparin injection 5,000 Units  5,000 Units Subcutaneous Q8H Gladstone Lighter, MD   5,000 Units at 10/22/16 [id number]  . HYDROcodone-acetaminophen (NORCO/VICODIN) 5-325 MG per tablet 1 tablet  1 tablet Oral Q6H PRN Hillary Bow, MD   1 tablet at 10/22/16 0913  . HYDROmorphone (DILAUDID) injection 1 mg  1 mg Intravenous Q4H PRN Gladstone Lighter, MD   1 mg at 10/22/16 0512  . lactulose (CHRONULAC) 10 GM/15ML solution 20 g  20 g Oral BID PRN Gladstone Lighter, MD      . metoprolol (LOPRESSOR) tablet 50 mg  50 mg Oral BID Lance Coon, MD   50 mg at 10/22/16 1005  .  nitroGLYCERIN (NITROSTAT) SL tablet 0.4 mg  0.4 mg Sublingual Q5 min PRN Srikar Sudini, MD      . nystatin (MYCOSTATIN) 100000 UNIT/ML suspension 500,000 Units  5 mL Mouth/Throat QID Gladstone Lighter, MD   500,000 Units at 10/22/16 1005  . ondansetron (ZOFRAN) tablet 4 mg  4 mg Oral Q6H PRN Lance Coon, MD       Or  . ondansetron [facility]) injection 4 mg  4 mg Intravenous Q6H PRN Lance Coon, MD   4 mg at 10/22/16 [id number]  . promethazine (PHENERGAN) injection 12.5 mg  12.5 mg Intravenous Q6H PRN Lance Coon, MD   12.5 mg at 10/22/16 0913  . senna-docusate (Senokot-S) tablet 2 tablet  2 tablet Oral BID Gladstone Lighter, MD   2 tablet at 10/22/16 1005     Discharge  Medications: Please see discharge summary for a list of discharge medications.  Relevant Imaging Results:  Relevant Lab Results:   Additional Information  (SSN: [id number])  Derick Seminara, Veronia Beets, LCSW

## 2016-10-22 NOTE — Progress Notes (Signed)
Pt reported that he passed a "good amount" of urine in the bedside commode. Pt's family member emptied the pot before this RN could assess. Ammie Dalton, RN

## 2016-10-22 NOTE — Progress Notes (Signed)
Urology Consult Follow Up  Subjective: Patient doing well. Cr improved to 3.0 with 3.6 UOP. Bone scan shows diffuse osseous metastatic disease. [facility] approved - awaiting delivery. Patient notes increased thirst  Anti-infectives: Anti-infectives    Start     Dose/Rate Route Frequency Ordered Stop   10/17/16 1400  ceFAZolin (ANCEF) 1 GM/50ML IVPB    Comments:  Sharen Heck: cabinet override      10/17/16 1400 10/18/16 0159   10/17/16 1400  ceFAZolin (ANCEF) 1 GM/50ML IVPB    Comments:  Sharen Heck: cabinet override      10/17/16 1400 10/18/16 0159   10/17/16 1030  ceFAZolin (ANCEF) IVPB 2g/100 mL premix     2 g 200 mL/hr over 30 Minutes Intravenous To Radiology 10/17/16 0903 10/17/16 1450      Current Facility-Administered Medications  Medication Dose Route Frequency Provider Last Rate Last Dose  . 0.9 %  sodium chloride infusion   Intravenous Continuous Srikar Sudini, MD 100 mL/hr at 10/22/16 [id number]    . acetaminophen (TYLENOL) tablet 650 mg  650 mg Oral Q6H PRN Lance Coon, MD   650 mg at 10/18/16 1621   Or  . acetaminophen (TYLENOL) suppository 650 mg  650 mg Rectal Q6H PRN Lance Coon, MD      . bisacodyl (DULCOLAX) EC tablet 10 mg  10 mg Oral Daily Gladstone Lighter, MD   10 mg at 10/21/16 1137  . degarelix [facility] injection 240 mg  240 mg Subcutaneous Once Gladstone Lighter, MD      . fentaNYL ([place] - dosed mcg/hr) 12.5 mcg  12.5 mcg Transdermal Q72H Gladstone Lighter, MD   12.5 mcg at 10/20/16 1837  . heparin injection 5,000 Units  5,000 Units Subcutaneous Q8H Gladstone Lighter, MD   5,000 Units at 10/22/16 [id number]  . HYDROcodone-acetaminophen (NORCO/VICODIN) 5-325 MG per tablet 1 tablet  1 tablet Oral Q6H PRN Hillary Bow, MD   1 tablet at 10/21/16 2135  . HYDROmorphone (DILAUDID) injection 1 mg  1 mg Intravenous Q4H PRN Gladstone Lighter, MD   1 mg at 10/22/16 0512  . lactulose (CHRONULAC) 10 GM/15ML solution 20 g  20 g Oral BID PRN Gladstone Lighter, MD       . metoprolol (LOPRESSOR) tablet 50 mg  50 mg Oral BID Lance Coon, MD   50 mg at 10/21/16 2135  . nitroGLYCERIN (NITROSTAT) SL tablet 0.4 mg  0.4 mg Sublingual Q5 min PRN Srikar Sudini, MD      . nystatin (MYCOSTATIN) 100000 UNIT/ML suspension 500,000 Units  5 mL Mouth/Throat QID Gladstone Lighter, MD   500,000 Units at 10/21/16 2135  . ondansetron (ZOFRAN) tablet 4 mg  4 mg Oral Q6H PRN Lance Coon, MD       Or  . ondansetron [facility]) injection 4 mg  4 mg Intravenous Q6H PRN Lance Coon, MD   4 mg at 10/22/16 [id number]  . promethazine (PHENERGAN) injection 12.5 mg  12.5 mg Intravenous Q6H PRN Lance Coon, MD   12.5 mg at 10/21/16 2135  . senna-docusate (Senokot-S) tablet 2 tablet  2 tablet Oral BID Gladstone Lighter, MD   Stopped at 10/21/16 2136     Objective: Vital signs in last 24 hours: Temp:  [98.6 F (37 C)-99.3 F (37.4 C)] 98.6 F (37 C) (12/22 0412) Pulse Rate:  [75-88] 86 (12/22 0412) Resp:  [17-18] 17 (12/22 0412) BP: (110-138)/(60-67) 138/67 (12/22 0412) SpO2:  [93 %-98 %] 93 % (12/22 0412) Weight:  [183 lb 14.4 oz (83.4 kg)] 183 lb 14.4  oz (83.4 kg) (12/22 0437)  Intake/Output from previous day: 12/21 0701 - 12/22 0700 In: 3145 [P.O.:600; I.V.:2005] Out: 3650 [Urine:3650] Intake/Output this shift: No intake/output data recorded.   Physical Exam Constitutional: Well nourished. Alert and oriented, No acute distress. HEENT: [place] AT, moist mucus membranes. Trachea midline, no masses. Cardiovascular: No clubbing, cyanosis, or edema. Respiratory: Normal respiratory effort, no increased work of breathing. GI: Abdomen is soft, non tender, non distended, no abdominal masses. Liver and spleen not palpable.  No hernias appreciated.  Stool sample for occult testing is not indicated.   GU: No CVA tenderness.  No bladder fullness or masses.   PCN dressings are clean and dry, draining clear, yellow urine.   Skin: No rashes, bruises or suspicious lesions. Lymph: No cervical or  inguinal adenopathy. Neurologic: Grossly intact, no focal deficits, moving all 4 extremities. Psychiatric: Normal mood and affect.  Lab Results:   Recent Labs  10/21/16 0427 10/22/16 0430  WBC 5.8 5.3  HGB 7.6* 7.4*  HCT 23.1* 22.3*  PLT 200 222   BMET  Recent Labs  10/21/16 0427 10/22/16 0430  NA 138 138  K 3.6 3.4*  CL 105 104  CO2 24 25  GLUCOSE 99 93  BUN 36* 29*  CREATININE 3.86* 3.02*  CALCIUM 6.3* 6.4*   PT/INR No results for input(s): LABPROT, INR in the last 72 hours. ABG No results for input(s): PHART, HCO3 in the last 72 hours.  Invalid input(s): PCO2, PO2  Studies/Results: Nm Bone Scan Whole Body  Result Date: 10/20/2016 CLINICAL DATA:  History of prostate carcinoma EXAM: NUCLEAR MEDICINE WHOLE BODY BONE SCAN TECHNIQUE: Whole body anterior and posterior images were obtained approximately 3 hours after intravenous injection of radiopharmaceutical. RADIOPHARMACEUTICALS:  23.85 mCi Technetium-87m MDP IV COMPARISON:  None. FINDINGS: Is adequate uptake of radioactive tracer throughout the bony skeleton. Multifocal areas of increased activity are identified throughout the pelvis, bilateral femurs, ribcage, right scapula, thoracic, cervical and lumbar spines consistent with metastatic disease. Some degenerative changes are noted in the feet. IMPRESSION: Diffuse metastatic disease. Electronically Signed   By: Inez Catalina M.D.   On: 10/20/2016 15:21     Assessment: Metastatic prostate cancer - firmagon approved. Awaiting delivery today vs. Monday. Discussed risks/benefits again with patient and family of androgen deprivation therapy. Patient hesitant to start chemo but is agreeable to seeing medical oncology as outpatient to discuss in greater detail  Acute renal failure - s/p bilateral nephrostomy tubes, draining well.  Renal function improving. Nephrology following. Will need nephrostomy exchanges q36months  Severe left hip pain - secondary to metastatic bony  lesion.  Dr. Baruch Gouty saw patient and has CT simulation for palliative radiation scheduled for next week   LOS: 6 days    Horald Pollen [facility] 10/22/2016

## 2016-10-22 NOTE — Progress Notes (Signed)
[facility]  ROUNDING NOTE   Subjective:  Cr down to 3.02 at the moment.  Good UOP of 3.6 liters over the past 24 hours.  Pain control is fair.    Objective:  Vital signs in last 24 hours:  Temp:  [98.3 F (36.8 C)-99.3 F (37.4 C)] 98.3 F (36.8 C) (12/22 1006) Pulse Rate:  [75-88] 87 (12/22 1006) Resp:  [17-18] 17 (12/22 0412) BP: (110-138)/(60-67) 124/67 (12/22 1006) SpO2:  [93 %-98 %] 95 % (12/22 1006) Weight:  [83.4 kg (183 lb 14.4 oz)] 83.4 kg (183 lb 14.4 oz) (12/22 0437)  Weight change: -1.435 kg (-3 lb 2.6 oz) Filed Weights   10/20/16 0334 10/21/16 0503 10/22/16 0437  Weight: 85.8 kg (189 lb 3 oz) 84.9 kg (187 lb 1 oz) 83.4 kg (183 lb 14.4 oz)    Intake/Output: I/O last 3 completed shifts: In: 5231.7 [P.O.:600; I.V.:4071.7; Other:560] Out: 5800 [Urine:5800]   Intake/Output this shift:  Total I/O In: 240 [P.O.:240] Out: 1300 [Urine:1300]  Physical Exam: General: No acute distress  Head: Normocephalic, atraumatic. Moist oral mucosal membranes  Eyes: Anicteric  Neck: Supple, trachea midline  Lungs:  Clear to auscultation, normal effort  Heart: S1S2 no rubs  Abdomen:  Soft, nontender, bowel sounds present  Extremities: Trace peripheral edema.  Neurologic: Nonfocal, moving all four extremities  Skin: No lesions       Basic Metabolic Panel:  Recent Labs Lab 10/18/16 0431 10/19/16 0427 10/20/16 0418 10/21/16 0427 10/22/16 0430  NA 138 136 137 138 138  K 4.9 4.3 3.9 3.6 3.4*  CL 105 103 105 105 104  CO2 23 24 24 24 25   GLUCOSE 106* 126* 100* 99 93  BUN 72* 58* 50* 36* 29*  CREATININE 7.25* 6.06* 4.85* 3.86* 3.02*  CALCIUM 7.2* 6.3* 6.2* 6.3* 6.4*    Liver Function Tests:  Recent Labs Lab 10/16/16 1853 10/19/16 0427  AST 11*  --   ALT 8*  --   ALKPHOS 240*  --   BILITOT 0.3  --   PROT 6.9  --   ALBUMIN 3.5 2.8*    Recent Labs Lab 10/16/16 1853  LIPASE 29   No results for input(s): AMMONIA in the last 168  hours.  CBC:  Recent Labs Lab 10/16/16 1853 10/17/16 0510 10/19/16 0427 10/20/16 0418 10/21/16 0427 10/22/16 0430  WBC 4.9 6.7 6.8 6.2 5.8 5.3  NEUTROABS 3.0  --  5.2 4.6  --   --   HGB 7.9* 8.5* 8.1* 7.4* 7.6* 7.4*  HCT 23.6* 25.9* 24.4* 22.2* 23.1* 22.3*  MCV 89.3 89.9 90.6 89.4 88.9 88.7  PLT 239 242 184 187 200 222    Cardiac Enzymes:  Recent Labs Lab 10/16/16 1853  TROPONINI <0.03    BNP: Invalid input(s): POCBNP  CBG: No results for input(s): GLUCAP in the last 168 hours.  Microbiology: No results found for this or any previous visit.  Coagulation Studies: No results for input(s): LABPROT, INR in the last 72 hours.  Urinalysis: No results for input(s): COLORURINE, LABSPEC, PHURINE, GLUCOSEU, HGBUR, BILIRUBINUR, KETONESUR, PROTEINUR, UROBILINOGEN, NITRITE, LEUKOCYTESUR in the last 72 hours.  Invalid input(s): APPERANCEUR    Imaging: Nm Bone Scan Whole Body  Result Date: 10/20/2016 CLINICAL DATA:  History of prostate carcinoma EXAM: NUCLEAR MEDICINE WHOLE BODY BONE SCAN TECHNIQUE: Whole body anterior and posterior images were obtained approximately 3 hours after intravenous injection of radiopharmaceutical. RADIOPHARMACEUTICALS:  23.85 mCi Technetium-22m MDP IV COMPARISON:  None. FINDINGS: Is adequate uptake of radioactive  tracer throughout the bony skeleton. Multifocal areas of increased activity are identified throughout the pelvis, bilateral femurs, ribcage, right scapula, thoracic, cervical and lumbar spines consistent with metastatic disease. Some degenerative changes are noted in the feet. IMPRESSION: Diffuse metastatic disease. Electronically Signed   By: Inez Catalina M.D.   On: 10/20/2016 15:21     Medications:   . sodium chloride 100 mL/hr at 10/22/16 0512   . degarelix  240 mg Subcutaneous Once  . fentaNYL  12.5 mcg Transdermal Q72H  . heparin subcutaneous  5,000 Units Subcutaneous Q8H  . metoprolol  50 mg Oral BID  . nystatin  5 mL  Mouth/Throat QID  . senna-docusate  2 tablet Oral BID   acetaminophen **OR** acetaminophen, HYDROcodone-acetaminophen, HYDROmorphone (DILAUDID) injection, lactulose, nitroGLYCERIN, ondansetron **OR** ondansetron (ZOFRAN) IV, promethazine  Assessment/ Plan:  81 y.o. male with medical problems of coronary disease MI in 2014, angioplasty with stent placement, hypertension, enlarged prostate, who was admitted to [facility] on 10/16/2016 for evaluation of acute onset low back pain. Upon presentation, his creatinine was found to be 6.69.  Only known prior creatinine is 0.94 from January 2014.   1.  Acute renal failure due to obstuctive uropathy. 2.  Bilateral hydronephrosis. 3.  Hyperkalemia. 4.  Anemia not otherwise specified. 5.  Metabolic acidosis, improved 6.  Suspected prostate cancer.  Plan:  Pt seen at bedside, Cr down to 3.02 with good urine output.  Continue nephrostomy drainage.  Pt has been approved for firmagon to treat underlying metastatic bony disease.  From our perspective ok to discharge, will need follow up in our office with Dr. Candiss Norse in 1-2 weeks.     LOS: 6 Fenix Rorke 12/22/201712:17 PM

## 2016-10-23 LAB — BASIC METABOLIC PANEL
Anion gap: 10 (ref 5–15)
BUN: 23 mg/dL — AB (ref 6–20)
CALCIUM: 6.6 mg/dL — AB (ref 8.9–10.3)
CO2: 22 mmol/L (ref 22–32)
CREATININE: 2.64 mg/dL — AB (ref 0.61–1.24)
Chloride: 107 mmol/L (ref 101–111)
GFR calc Af Amer: 25 mL/min — ABNORMAL LOW (ref >60)
GFR calc non Af Amer: 21 mL/min — ABNORMAL LOW (ref >60)
GLUCOSE: 93 mg/dL (ref 65–99)
Potassium: 3.6 mmol/L (ref 3.5–5.1)
Sodium: 139 mmol/L (ref 135–145)

## 2016-10-23 MED ORDER — FENTANYL 12 MCG/HR TD PT72: 12.5000 ug | MEDICATED_PATCH | TRANSDERMAL | 0 refills | Status: DC

## 2016-10-23 MED ORDER — ONDANSETRON HCL 4 MG PO TABS: 4.0000 mg | ORAL_TABLET | Freq: Four times a day (QID) | ORAL | 0 refills | Status: DC | PRN

## 2016-10-23 MED ORDER — HYDROCODONE-ACETAMINOPHEN 5-325 MG PO TABS: 1.0000 | ORAL_TABLET | Freq: Four times a day (QID) | ORAL | 0 refills | Status: DC | PRN

## 2016-10-23 MED ORDER — SENNOSIDES-DOCUSATE SODIUM 8.6-50 MG PO TABS: 2.0000 | ORAL_TABLET | Freq: Two times a day (BID) | ORAL | 0 refills | Status: DC

## 2016-10-23 NOTE — Discharge Summary (Signed)
[facility] [facility] [place] NAME: Johnathan Gould    MR#:  [id number]  DATE OF BIRTH:  July 08, 1935  DATE OF ADMISSION:  10/16/2016 ADMITTING PHYSICIAN: Lance Coon, MD  DATE OF DISCHARGE: 10/23/16  PRIMARY CARE PHYSICIAN: No PCP Per Patient    ADMISSION DIAGNOSIS:  Hyperkalemia [E87.5] Bladder mass [N32.89] Acute renal failure, unspecified acute renal failure type ([place]) [N17.9]  DISCHARGE DIAGNOSIS:  Metastatic Prostate cancer Acute renal failure s/p bilateral nephrostomy tube placement Bone pains due to mets  SECONDARY DIAGNOSIS:   Past Medical History:  Diagnosis Date  . CAD (coronary artery disease)   . Hypertension   . RAD (reactive airway disease)     HOSPITAL COURSE:   81 year old male with past medical history significant for coronary artery disease and hypertension admitted to the hospital with back pain and noted to have bilateral hydronephrosis from an obstructing bladder mass.  #1 acute renal failure-obstructive nephropathy -CT abdomen with obstructing bladder mass, likely metastatic from prostrate. Has bilateral nephrostomy tubes placed in. Now draining clear urine. -Creatinine improved from 7 to 2.6 -Appreciate nephrology consult  #2 metastatic prostate cancer -Urology consult appreciated. No need for prostrate biopsy per urology - recommended use of 1 time dose of 240 mg SQ firmagon (recieved on 10/22/16) patient will respond within 24hrs and f/u dose in the office in 1 month- appreciate urology input  #3 bone pain-significant bone pain secondary to osseous metastatic sepsis. Bone scan with diffuse metastases. Radiation oncology consulted for possible palliative radiation. Will be started next week dec 27th Adjust pain medication. Started fentanyl patch  #4 hypertension-on metoprolol twice a day  #5 anemia of chronic disease- Baseline hemoglobin around 8, steadily dropping. Hemoglobin at 7.6  today -Closely monitor. Transfuse if hemoglobin less than 7  #6 DVT prophylaxis- SQ heparin. Monitor if anemia worsens- discontinue  #7 constipation-secondary to pain medications. We'll adjust pain medications  PT had recommended rehab. Pt declined. Family wants to reconsider but given holidays only offer from [facility] Family declined and will take pt home HHPT, bedside commode and RW given  D/w wife and 2 sons  D/c home D/w dr Holley Raring CONSULTS OBTAINED:  Treatment Team:  Nickie Retort, MD Murlean Iba, MD  DRUG ALLERGIES:  Not on File  DISCHARGE MEDICATIONS:   Current Discharge Medication List    START taking these medications   Details  fentaNYL (DURAGESIC - DOSED MCG/HR) 12 MCG/HR Place 1 patch (12.5 mcg total) onto the skin every 3 (three) days. Qty: 5 patch, Refills: 0    HYDROcodone-acetaminophen (NORCO/VICODIN) 5-325 MG tablet Take 1 tablet by mouth every 6 (six) hours as needed for moderate pain. Qty: 30 tablet, Refills: 0    senna-docusate (SENOKOT-S) 8.6-50 MG tablet Take 2 tablets by mouth 2 (two) times daily. Qty: 30 tablet, Refills: 0      CONTINUE these medications which have NOT CHANGED   Details  lisinopril (PRINIVIL,ZESTRIL) 5 MG tablet Take 5 mg by mouth daily.    metoprolol (LOPRESSOR) 50 MG tablet Take 50 mg by mouth 2 (two) times daily.    nitroGLYCERIN (NITROSTAT) 0.4 MG SL tablet Place 0.4 mg under the tongue every 5 (five) minutes x 3 doses as needed for chest pain.        If you experience worsening of your admission symptoms, develop shortness of breath, life threatening emergency, suicidal or homicidal thoughts you must seek medical attention immediately by calling 911 or calling your MD immediately  if  symptoms less severe.  You Must read complete instructions/literature along with all the possible adverse reactions/side effects for all the Medicines you take and that have been prescribed to you. Take any new Medicines after you have  completely understood and accept all the possible adverse reactions/side effects.   Please note  You were cared for by a hospitalist during your hospital stay. If you have any questions about your discharge medications or the care you received while you were in the hospital after you are discharged, you can call the unit and asked to speak with the hospitalist on call if the hospitalist that took care of you is not available. Once you are discharged, your primary care physician will handle any further medical issues. Please note that NO REFILLS for any discharge medications will be authorized once you are discharged, as it is imperative that you return to your primary care physician (or establish a relationship with a primary care physician if you do not have one) for your aftercare needs so that they can reassess your need for medications and monitor your lab values. Today   SUBJECTIVE   Doing well  VITAL SIGNS:  Blood pressure 139/71, pulse 84, temperature 99.1 F (37.3 C), temperature source Oral, resp. rate 20, height 5\' 10"  (1.778 m), weight 84.5 kg (186 lb 4.8 oz), SpO2 94 %.  I/O:   Intake/Output Summary (Last 24 hours) at 10/23/16 1007 Last data filed at 10/23/16 [id number]  Gross per 24 hour  Intake             3440 ml  Output             3200 ml  Net              240 ml    PHYSICAL EXAMINATION:  GENERAL:  81 y.o.-year-old patient lying in the bed with no acute distress.  EYES: Pupils equal, round, reactive to light and accommodation. No scleral icterus. Extraocular muscles intact.  HEENT: Head atraumatic, normocephalic. Oropharynx and nasopharynx clear.  NECK:  Supple, no jugular venous distention. No thyroid enlargement, no tenderness.  LUNGS: Normal breath sounds bilaterally, no wheezing, rales,rhonchi or crepitation. No use of accessory muscles of respiration.  CARDIOVASCULAR: S1, S2 normal. No murmurs, rubs, or gallops.  ABDOMEN: Soft, non-tender, non-distended. Bowel sounds  present. No organomegaly or mass. Bilateral nephrostomy tubes draining well EXTREMITIES: No pedal edema, cyanosis, or clubbing.  NEUROLOGIC: Cranial nerves II through XII are intact. Muscle strength 5/5 in all extremities. Sensation intact. Gait not checked.  PSYCHIATRIC: The patient is alert and oriented x 3.  SKIN: No obvious rash, lesion, or ulcer.   DATA REVIEW:   CBC   Recent Labs Lab 10/22/16 0430  WBC 5.3  HGB 7.4*  HCT 22.3*  PLT 222    Chemistries   Recent Labs Lab 10/16/16 1853  10/23/16 0613  NA 136  < > 139  K 5.6*  < > 3.6  CL 108  < > 107  CO2 16*  < > 22  GLUCOSE 96  < > 93  BUN 77*  < > 23*  CREATININE 6.69*  < > 2.64*  CALCIUM 7.7*  < > 6.6*  AST 11*  --   --   ALT 8*  --   --   ALKPHOS 240*  --   --   BILITOT 0.3  --   --   < > = values in this interval not displayed.  Microbiology Results   No results  found for this or any previous visit (from the past 240 hour(s)).  RADIOLOGY:  No results found.   Management plans discussed with the patient, family and they are in agreement.  CODE STATUS:     Code Status Orders        Start     Ordered   10/16/16 2359  Do not attempt resuscitation (DNR)  Continuous    Question Answer Comment  In the event of cardiac or respiratory ARREST Do not call a "code blue"   In the event of cardiac or respiratory ARREST Do not perform Intubation, CPR, defibrillation or ACLS   In the event of cardiac or respiratory ARREST Use medication by any route, position, wound care, and other measures to relive pain and suffering. May use oxygen, suction and manual treatment of airway obstruction as needed for comfort.      10/16/16 2358    Code Status History    Date Active Date Inactive Code Status Order ID Comments User Context   This patient has a current code status but no historical code status.    Advance Directive Documentation   Flowsheet Row Most Recent Value  Type of Advance Directive  -- [unsure what he  has]  Pre-existing out of facility DNR order (yellow form or pink MOST form)  No data  "MOST" Form in Place?  No data      TOTAL TIME TAKING CARE OF THIS PATIENT: 40 minutes.    Taj Arteaga M.D on 10/23/2016 at 10:07 AM  Between 7am to 6pm - Pager - [phone] After 6pm go to [email or web address] - password [email or web address] [facility]  [place] [facility]  Office  [phone]  CC: Primary care physician; No PCP Per Patient

## 2016-10-23 NOTE — Care Management Note (Signed)
Case Management Note  Patient Details  Name: Johnathan Gould MRN: [id number] Date of Birth: Jun 04, 1935  Subjective/Objective:   A referral for HH-PT, RN, Aide and for DME equipment [facility] and RW was faxed to [place].                 Action/Plan:   Expected Discharge Date:                  Expected Discharge Plan:     In-House Referral:     Discharge planning Services     Post Acute Care Choice:    Choice offered to:     DME Arranged:    DME Agency:     HH Arranged:    HH Agency:     Status of Service:     If discussed at [employer] of Stay Meetings, dates discussed:    Additional Comments:  Jalyssa Fleisher A, RN 10/23/2016, 1:19 PM

## 2016-10-23 NOTE — Progress Notes (Signed)
[facility]  ROUNDING NOTE   Subjective:  Patient doing better. Creatinine down to 2.6. Nephrostomies appear functional.  Objective:  Vital signs in last 24 hours:  Temp:  [98.8 F (37.1 C)-99.8 F (37.7 C)] 99.8 F (37.7 C) (12/23 1038) Pulse Rate:  [75-94] 94 (12/23 1038) Resp:  [20] 20 (12/23 0632) BP: (113-139)/(64-71) 120/67 (12/23 1038) SpO2:  [93 %-95 %] 93 % (12/23 1038) Weight:  [84.5 kg (186 lb 4.8 oz)] 84.5 kg (186 lb 4.8 oz) (12/23 0500)  Weight change: 1.089 kg (2 lb 6.4 oz) Filed Weights   10/21/16 0503 10/22/16 0437 10/23/16 0500  Weight: 84.9 kg (187 lb 1 oz) 83.4 kg (183 lb 14.4 oz) 84.5 kg (186 lb 4.8 oz)    Intake/Output: I/O last 3 completed shifts: In: [id number] [P.O.:1080; I.V.:3590; Other:40] Out: [id number] [Urine:5975]   Intake/Output this shift:  Total I/O In: -  Out: 575 [Urine:575]  Physical Exam: General: No acute distress  Head: Normocephalic, atraumatic. Moist oral mucosal membranes  Eyes: Anicteric  Neck: Supple, trachea midline  Lungs:  Clear to auscultation, normal effort  Heart: S1S2 no rubs  Abdomen:  Soft, nontender, bowel sounds present  Extremities: Trace peripheral edema.  Neurologic: Nonfocal, moving all four extremities  Skin: No lesions       Basic Metabolic Panel:  Recent Labs Lab 10/19/16 0427 10/20/16 0418 10/21/16 0427 10/22/16 0430 10/23/16 0613  NA 136 137 138 138 139  K 4.3 3.9 3.6 3.4* 3.6  CL 103 105 105 104 107  CO2 24 24 24 25 22   GLUCOSE 126* 100* 99 93 93  BUN 58* 50* 36* 29* 23*  CREATININE 6.06* 4.85* 3.86* 3.02* 2.64*  CALCIUM 6.3* 6.2* 6.3* 6.4* 6.6*    Liver Function Tests:  Recent Labs Lab 10/16/16 1853 10/19/16 0427  AST 11*  --   ALT 8*  --   ALKPHOS 240*  --   BILITOT 0.3  --   PROT 6.9  --   ALBUMIN 3.5 2.8*    Recent Labs Lab 10/16/16 1853  LIPASE 29   No results for input(s): AMMONIA in the last 168 hours.  CBC:  Recent Labs Lab 10/16/16 1853  10/17/16 0510 10/19/16 0427 10/20/16 0418 10/21/16 0427 10/22/16 0430  WBC 4.9 6.7 6.8 6.2 5.8 5.3  NEUTROABS 3.0  --  5.2 4.6  --   --   HGB 7.9* 8.5* 8.1* 7.4* 7.6* 7.4*  HCT 23.6* 25.9* 24.4* 22.2* 23.1* 22.3*  MCV 89.3 89.9 90.6 89.4 88.9 88.7  PLT 239 242 184 187 200 222    Cardiac Enzymes:  Recent Labs Lab 10/16/16 1853  TROPONINI <0.03    BNP: Invalid input(s): POCBNP  CBG: No results for input(s): GLUCAP in the last 168 hours.  Microbiology: No results found for this or any previous visit.  Coagulation Studies: No results for input(s): LABPROT, INR in the last 72 hours.  Urinalysis: No results for input(s): COLORURINE, LABSPEC, PHURINE, GLUCOSEU, HGBUR, BILIRUBINUR, KETONESUR, PROTEINUR, UROBILINOGEN, NITRITE, LEUKOCYTESUR in the last 72 hours.  Invalid input(s): APPERANCEUR    Imaging: No results found.   Medications:    . fentaNYL  12.5 mcg Transdermal Q72H  . heparin subcutaneous  5,000 Units Subcutaneous Q8H  . metoprolol  50 mg Oral BID  . nystatin  5 mL Mouth/Throat QID  . senna-docusate  2 tablet Oral BID   acetaminophen **OR** acetaminophen, HYDROcodone-acetaminophen, HYDROmorphone (DILAUDID) injection, lactulose, nitroGLYCERIN, ondansetron **OR** ondansetron (ZOFRAN) IV, promethazine  Assessment/ Plan:  81  y.o. male with medical problems of coronary disease MI in 2014, angioplasty with stent placement, hypertension, enlarged prostate, who was admitted to [facility] on 10/16/2016 for evaluation of acute onset low back pain. Upon presentation, his creatinine was found to be 6.69.  Only known prior creatinine is 0.94 from January 2014.   1.  Acute renal failure due to obstuctive uropathy. 2.  Bilateral hydronephrosis, s/p bilateral nephrostomy placement 3.  Hyperkalemia, resolved 4.  Anemia not otherwise specified. 5.  Metabolic acidosis, improved 6.  Suspected prostate cancer.  Plan:  Renal function continues to improve. Creatinine down to  2.64. Good urine output of 3.9 L over the preceding 24 hours. Overall the patient has improved significantly since admission. He is being discharged and will have follow-up with my partner Dr. Candiss Norse in one to 2 weeks. Thanks for allowing [place] to progress up 8.    LOS: 7 Johnathan Gould 12/23/201711:53 AM

## 2016-10-23 NOTE — Progress Notes (Signed)
Discharge instructions along with home medications and follow up gone over with patient and son. Both verbalize that they understood instructions. Two prescriptions given to patient. IV removed. Pt being discharged home on room air, no distress noted. Ammie Dalton, RN

## 2016-10-27 ENCOUNTER — Other Ambulatory Visit: Payer: Self-pay | Admitting: Radiology

## 2016-10-27 ENCOUNTER — Ambulatory Visit
Admission: RE | Admit: 2016-10-27 | Discharge: 2016-10-27 | Disposition: A | Discharge status: Home / Self Care | Payer: Medicare Other | Source: Ambulatory Visit | Attending: Radiation Oncology | Admitting: Radiation Oncology

## 2016-10-27 DIAGNOSIS — C7951 Secondary malignant neoplasm of bone: Secondary | ICD-10-CM | POA: Diagnosis present

## 2016-10-27 DIAGNOSIS — C61 Malignant neoplasm of prostate: Secondary | ICD-10-CM | POA: Insufficient documentation

## 2016-10-27 DIAGNOSIS — N1330 Unspecified hydronephrosis: Secondary | ICD-10-CM

## 2016-10-27 DIAGNOSIS — Z510 Encounter for antineoplastic radiation therapy: Secondary | ICD-10-CM | POA: Insufficient documentation

## 2016-10-28 DIAGNOSIS — C7951 Secondary malignant neoplasm of bone: Secondary | ICD-10-CM | POA: Diagnosis not present

## 2016-10-29 ENCOUNTER — Other Ambulatory Visit: Payer: Self-pay | Admitting: *Deleted

## 2016-10-29 DIAGNOSIS — C61 Malignant neoplasm of prostate: Secondary | ICD-10-CM

## 2016-11-02 ENCOUNTER — Ambulatory Visit
Admission: RE | Admit: 2016-11-02 | Discharge: 2016-11-02 | Disposition: A | Discharge status: Home / Self Care | Payer: Medicare Other | Source: Ambulatory Visit | Attending: Radiation Oncology | Admitting: Radiation Oncology

## 2016-11-02 ENCOUNTER — Other Ambulatory Visit: Payer: Self-pay | Admitting: *Deleted

## 2016-11-02 DIAGNOSIS — C7951 Secondary malignant neoplasm of bone: Secondary | ICD-10-CM | POA: Diagnosis not present

## 2016-11-02 MED ORDER — ONDANSETRON HCL 4 MG PO TABS: 4.0000 mg | ORAL_TABLET | Freq: Four times a day (QID) | ORAL | 0 refills | Status: DC | PRN

## 2016-11-02 MED ORDER — HYDROCODONE-ACETAMINOPHEN 5-325 MG PO TABS: 1.0000 | ORAL_TABLET | Freq: Four times a day (QID) | ORAL | 0 refills | Status: DC | PRN

## 2016-11-03 ENCOUNTER — Ambulatory Visit
Admission: RE | Admit: 2016-11-03 | Discharge: 2016-11-03 | Disposition: A | Discharge status: Home / Self Care | Payer: Medicare Other | Source: Ambulatory Visit | Attending: Radiation Oncology | Admitting: Radiation Oncology

## 2016-11-03 DIAGNOSIS — C7951 Secondary malignant neoplasm of bone: Secondary | ICD-10-CM | POA: Diagnosis not present

## 2016-11-04 ENCOUNTER — Ambulatory Visit
Admission: RE | Admit: 2016-11-04 | Discharge: 2016-11-04 | Disposition: A | Discharge status: Home / Self Care | Payer: Medicare Other | Source: Ambulatory Visit | Attending: Radiation Oncology | Admitting: Radiation Oncology

## 2016-11-04 ENCOUNTER — Inpatient Hospital Stay: Payer: Medicare Other | Attending: Oncology | Admitting: Oncology

## 2016-11-04 ENCOUNTER — Encounter: Payer: Self-pay | Admitting: Oncology

## 2016-11-04 VITALS — BP 103/61 | HR 72 | Temp 98.1°F | Resp 18 | Ht 68.0 in | Wt 174.4 lb

## 2016-11-04 DIAGNOSIS — R110 Nausea: Secondary | ICD-10-CM | POA: Insufficient documentation

## 2016-11-04 DIAGNOSIS — D649 Anemia, unspecified: Secondary | ICD-10-CM | POA: Insufficient documentation

## 2016-11-04 DIAGNOSIS — I2510 Atherosclerotic heart disease of native coronary artery without angina pectoris: Secondary | ICD-10-CM

## 2016-11-04 DIAGNOSIS — E875 Hyperkalemia: Secondary | ICD-10-CM | POA: Diagnosis not present

## 2016-11-04 DIAGNOSIS — C7951 Secondary malignant neoplasm of bone: Secondary | ICD-10-CM | POA: Diagnosis not present

## 2016-11-04 DIAGNOSIS — M545 Low back pain: Secondary | ICD-10-CM | POA: Insufficient documentation

## 2016-11-04 DIAGNOSIS — R599 Enlarged lymph nodes, unspecified: Secondary | ICD-10-CM | POA: Insufficient documentation

## 2016-11-04 DIAGNOSIS — N3289 Other specified disorders of bladder: Secondary | ICD-10-CM

## 2016-11-04 DIAGNOSIS — N131 Hydronephrosis with ureteral stricture, not elsewhere classified: Secondary | ICD-10-CM | POA: Insufficient documentation

## 2016-11-04 DIAGNOSIS — C779 Secondary and unspecified malignant neoplasm of lymph node, unspecified: Secondary | ICD-10-CM | POA: Insufficient documentation

## 2016-11-04 DIAGNOSIS — J45909 Unspecified asthma, uncomplicated: Secondary | ICD-10-CM | POA: Insufficient documentation

## 2016-11-04 DIAGNOSIS — J439 Emphysema, unspecified: Secondary | ICD-10-CM | POA: Insufficient documentation

## 2016-11-04 DIAGNOSIS — R531 Weakness: Secondary | ICD-10-CM | POA: Insufficient documentation

## 2016-11-04 DIAGNOSIS — Z79899 Other long term (current) drug therapy: Secondary | ICD-10-CM | POA: Diagnosis not present

## 2016-11-04 DIAGNOSIS — C61 Malignant neoplasm of prostate: Secondary | ICD-10-CM | POA: Insufficient documentation

## 2016-11-04 DIAGNOSIS — R5383 Other fatigue: Secondary | ICD-10-CM | POA: Insufficient documentation

## 2016-11-04 DIAGNOSIS — N179 Acute kidney failure, unspecified: Secondary | ICD-10-CM | POA: Insufficient documentation

## 2016-11-04 DIAGNOSIS — I517 Cardiomegaly: Secondary | ICD-10-CM

## 2016-11-04 DIAGNOSIS — N401 Enlarged prostate with lower urinary tract symptoms: Secondary | ICD-10-CM

## 2016-11-04 DIAGNOSIS — Z87891 Personal history of nicotine dependence: Secondary | ICD-10-CM | POA: Diagnosis not present

## 2016-11-04 DIAGNOSIS — I959 Hypotension, unspecified: Secondary | ICD-10-CM | POA: Diagnosis not present

## 2016-11-04 DIAGNOSIS — Z801 Family history of malignant neoplasm of trachea, bronchus and lung: Secondary | ICD-10-CM | POA: Insufficient documentation

## 2016-11-04 DIAGNOSIS — N329 Bladder disorder, unspecified: Secondary | ICD-10-CM | POA: Insufficient documentation

## 2016-11-04 DIAGNOSIS — M2550 Pain in unspecified joint: Secondary | ICD-10-CM | POA: Diagnosis not present

## 2016-11-04 DIAGNOSIS — K5730 Diverticulosis of large intestine without perforation or abscess without bleeding: Secondary | ICD-10-CM | POA: Diagnosis not present

## 2016-11-04 DIAGNOSIS — I10 Essential (primary) hypertension: Secondary | ICD-10-CM

## 2016-11-04 NOTE — Progress Notes (Signed)
Pt here today for follow up after seen in ER. Became increasingly anxious and hesitant to answer questions. Stated main reason here is for pain c/o son gave some hx.

## 2016-11-05 ENCOUNTER — Ambulatory Visit
Admission: RE | Admit: 2016-11-05 | Discharge: 2016-11-05 | Disposition: A | Discharge status: Home / Self Care | Payer: Medicare Other | Source: Ambulatory Visit | Attending: Radiation Oncology | Admitting: Radiation Oncology

## 2016-11-05 ENCOUNTER — Telehealth: Payer: Self-pay | Admitting: *Deleted

## 2016-11-05 ENCOUNTER — Encounter: Payer: Self-pay | Admitting: Oncology

## 2016-11-05 DIAGNOSIS — N3289 Other specified disorders of bladder: Secondary | ICD-10-CM

## 2016-11-05 DIAGNOSIS — C61 Malignant neoplasm of prostate: Secondary | ICD-10-CM

## 2016-11-05 DIAGNOSIS — C7951 Secondary malignant neoplasm of bone: Secondary | ICD-10-CM | POA: Diagnosis not present

## 2016-11-05 MED ORDER — OXYCODONE HCL 10 MG PO TABS: 10.0000 mg | ORAL_TABLET | ORAL | 0 refills | Status: DC | PRN

## 2016-11-05 MED ORDER — DEXAMETHASONE 4 MG PO TABS: 4.0000 mg | ORAL_TABLET | Freq: Two times a day (BID) | ORAL | 0 refills | Status: DC

## 2016-11-05 MED ORDER — OXYCODONE HCL 10 MG PO TABS: 5.0000 mg | ORAL_TABLET | ORAL | 0 refills | Status: DC | PRN

## 2016-11-05 MED ORDER — FENTANYL 12 MCG/HR TD PT72: 12.5000 ug | MEDICATED_PATCH | TRANSDERMAL | 0 refills | Status: DC

## 2016-11-05 MED ORDER — OXYCODONE HCL 5 MG PO TABS: 5.0000 mg | ORAL_TABLET | ORAL | 0 refills | Status: DC | PRN

## 2016-11-05 NOTE — Telephone Encounter (Signed)
He can stop his norco and we will switch him to oxycodone 10 mg PO Q4 prn. We will make a follow up phone call next week to see how he is doing on his new pain regimen

## 2016-11-05 NOTE — Telephone Encounter (Signed)
Asking that patient be evaluated when he comes in for XRT today. He is taking Norco 5/325 mg every 6 hours and he is rating his pain at a 10/10. His appt today is at 4:45. Please advise

## 2016-11-05 NOTE — Telephone Encounter (Signed)
Pt is in radiation and son mentioned that pt put last patch on today. I spoke to [place] who is on call because Dr. [name] already gone today and she refilled patch. Printed rx and gave to family and [employer].

## 2016-11-05 NOTE — Progress Notes (Signed)
Hematology/Oncology Consult note [facility] Telephone:([phone] [phone]  Patient Care Team: No Pcp Per Patient as PCP - General (General Practice)   Name of the patient: Johnathan Gould  [id number]  14-Jan-1935    Reason for referral- newly diagnosed metastatic castration sensitive prostate cancer   Referring physician- Dr. Pilar Jarvis  Date of visit: 11/05/16   History of presenting illness- patient is a 82 year old male was recently admitted to the hospital on 10/16/2016 with symptoms of left hip pain and renal failure. He was found to have bilateral hydronephrosis and is status post bilateral nephrostomy tube placement. CT abdomen showed an irregular polypoid mass at the posterior aspect of the bladder highly concerning for bladder carcinoma. Extensive bulky adenopathy throughout the abdomen and pelvis consistent with nodal metastases. Widespread osseous metastatic disease. Bone scan showed multifocal areas of increased activity throughout the pelvis, bilateral femurs, rib cage, right scapula, thoracic cervical and lumbar spine consistent with metastatic disease. Patient was found to have enlarged abnormal prostate as well as an elevated PSA of 469 and was presumptively diagnosed with metastatic prostate cancer. He was started on  Firmagon by urology. He was also seen by radiation oncology and started palliative radiation to his left hip on 11/04/2016 and will be getting total of 10 fractions. He is here to discuss further treatment options.  The patient does report some pain on ambulation but is relatively well controlled with hydrocodone. He does have a history of coronary artery disease status post stent placement in 2014. Reports mild fatigue. Denies other complaints  ECOG PS- 1  Pain scale- 4   Review of systems- Review of Systems  Constitutional: Positive for malaise/fatigue. Negative for chills, fever and weight loss.  HENT: Negative for  congestion, ear discharge and nosebleeds.   Eyes: Negative for blurred vision.  Respiratory: Negative for cough, hemoptysis, sputum production, shortness of breath and wheezing.   Cardiovascular: Negative for chest pain, palpitations, orthopnea and claudication.  Gastrointestinal: Negative for abdominal pain, blood in stool, constipation, diarrhea, heartburn, melena, nausea and vomiting.  Genitourinary: Negative for dysuria, flank pain, frequency, hematuria and urgency.  Musculoskeletal: Positive for joint pain (Left hip pain). Negative for back pain and myalgias.  Skin: Negative for rash.  Neurological: Negative for dizziness, tingling, focal weakness, seizures, weakness and headaches.  Endo/Heme/Allergies: Does not bruise/bleed easily.  Psychiatric/Behavioral: Negative for depression and suicidal ideas. The patient does not have insomnia.     Not on File  Patient Active Problem List   Diagnosis Date Noted  . Prostate cancer ([place])   . Urinary obstruction 10/16/2016  . Acute renal failure (ARF) ([place]) 10/16/2016  . Bladder mass 10/16/2016  . CAD (coronary artery disease) 10/16/2016  . HTN (hypertension) 10/16/2016     Past Medical History:  Diagnosis Date  . CAD (coronary artery disease)   . Hypertension   . RAD (reactive airway disease)      Past Surgical History:  Procedure Laterality Date  . CORONARY ANGIOPLASTY WITH STENT PLACEMENT    . IR GENERIC HISTORICAL  10/17/2016   IR NEPHROSTOMY PLACEMENT RIGHT 10/17/2016 [facility]-INTERV RAD  . IR GENERIC HISTORICAL  10/17/2016   IR NEPHROSTOMY PLACEMENT LEFT 10/17/2016 [name], MD [facility]-INTERV RAD  . KNEE SURGERY Left 2006   Laparascopy done on left knee, scar tissue taken out  . nephrostomy tubes Bilateral     Social History   Social History  . Marital status: Married    Spouse name: N/A  . Number of children:  N/A  . Years of education: N/A   Occupational History  . Not on file.   Social History Main Topics  .  Smoking status: Former Smoker    Packs/day: 1.00    Years: 69.50    Quit date: 11/04/2014  . Smokeless tobacco: Never Used  . Alcohol use No  . Drug use: No  . Sexual activity: Yes   Other Topics Concern  . Not on file   Social History Narrative  . No narrative on file     Family History  Problem Relation Age of Onset  . Hypertension Other   . Lung cancer Brother      Current Outpatient Prescriptions:  .  fentaNYL (DURAGESIC - DOSED MCG/HR) 12 MCG/HR, Place 1 patch (12.5 mcg total) onto the skin every 3 (three) days., Disp: 5 patch, Rfl: 0 .  HYDROcodone-acetaminophen (NORCO/VICODIN) 5-325 MG tablet, Take 1 tablet by mouth every 6 (six) hours as needed for moderate pain., Disp: 30 tablet, Rfl: 0 .  lisinopril (PRINIVIL,ZESTRIL) 5 MG tablet, Take 5 mg by mouth daily., Disp: , Rfl:  .  metoprolol (LOPRESSOR) 50 MG tablet, Take 50 mg by mouth 2 (two) times daily., Disp: , Rfl:  .  nitroGLYCERIN (NITROSTAT) 0.4 MG SL tablet, Place 0.4 mg under the tongue every 5 (five) minutes x 3 doses as needed for chest pain., Disp: , Rfl:  .  ondansetron (ZOFRAN) 4 MG tablet, Take 1 tablet (4 mg total) by mouth every 6 (six) hours as needed for nausea., Disp: 40 tablet, Rfl: 0 .  senna-docusate (SENOKOT-S) 8.6-50 MG tablet, Take 2 tablets by mouth 2 (two) times daily., Disp: 30 tablet, Rfl: 0   Physical exam:  Vitals:   11/04/16 1533  BP: 103/61  Pulse: 72  Resp: 18  Temp: 98.1 F (36.7 C)  TempSrc: Tympanic  Weight: 174 lb 6.1 oz (79.1 kg)  Height: 5\' 8"  (1.727 m)   Physical Exam  Constitutional: He is oriented to person, place, and time and well-developed, well-nourished, and in no distress.  HENT:  Head: Normocephalic and atraumatic.  Eyes: EOM are normal. Pupils are equal, round, and reactive to light.  Neck: Normal range of motion.  Cardiovascular: Normal rate, regular rhythm and normal heart sounds.   Pulmonary/Chest: Effort normal and breath sounds normal.  Abdominal: Soft.  Bowel sounds are normal.  Bilateral nephrostomy tubes in place draining clear urine  Neurological: He is alert and oriented to person, place, and time.  Skin: Skin is warm and dry.       CMP Latest Ref Rng & Units 10/23/2016  Glucose 65 - 99 mg/dL 93  BUN 6 - 20 mg/dL 23(H)  Creatinine 0.61 - 1.24 mg/dL 2.64(H)  Sodium 135 - 145 mmol/L 139  Potassium 3.5 - 5.1 mmol/L 3.6  Chloride 101 - 111 mmol/L 107  CO2 22 - 32 mmol/L 22  Calcium 8.9 - 10.3 mg/dL 6.6(L)  Total Protein 6.5 - 8.1 g/dL -  Total Bilirubin 0.3 - 1.2 mg/dL -  Alkaline Phos 38 - 126 U/L -  AST 15 - 41 U/L -  ALT 17 - 63 U/L -   CBC Latest Ref Rng & Units 10/22/2016  WBC 3.8 - 10.6 K/uL 5.3  Hemoglobin 13.0 - 18.0 g/dL 7.4(L)  Hematocrit 40.0 - 52.0 % 22.3(L)  Platelets 150 - 440 K/uL 222    No images are attached to the encounter.  Ct Abdomen Pelvis Wo Contrast  Result Date: 10/16/2016 CLINICAL DATA:  Initial evaluation  for acute bilateral flank and lower back pain extending into groin. EXAM: CT ABDOMEN AND PELVIS WITHOUT CONTRAST TECHNIQUE: Multidetector CT imaging of the abdomen and pelvis was performed following the standard protocol without IV contrast. COMPARISON:  None available. FINDINGS: Lower chest: Mild subsegmental atelectasis seen dependently within the visualized lung bases. Visualized lungs are otherwise clear. Fat containing Bochdalek's type hernia present at the left lung base. Cardiomegaly with prominent coronary artery calcifications partially visualized. Hepatobiliary: Limited noncontrast evaluation of the liver is unremarkable. Gallbladder within normal limits. No biliary dilatation. Pancreas: Pancreas within normal limits. Spleen: Spleen within normal limits. Adrenals/Urinary Tract: Adrenal glands are normal. Bilateral hydroureteronephrosis is seen, severe on the right, more moderate on the left. No radiopaque calculi seen within either kidney or along the course of either dilated ureter. There is  irregular polypoid soft tissue filling the posterior bladder lumen, measuring approximately 4.9 x 2.6 cm (series 2, image 75). Exact measurements difficult given the lack of IV contrast. Finding highly concerning for bladder carcinoma. Prostate is enlarged and intimately apposed to the mass within the bladder lumen. Difficult to discern prostate versus mass on this noncontrast examination. Incidental note made of a 1 cm hyperdense left renal cyst (series 2, image 30), indeterminate, but possibly a complex and/ or hemorrhagic cyst. Stomach/Bowel: Stomach within normal limits. No evidence for bowel obstruction. Appendix normal. Colonic diverticulosis without evidence for acute diverticulitis. Vascular/Lymphatic: Moderate aorto bi-iliac atherosclerotic disease. No aneurysm. Bulky periaortic lymph nodes measure up to 19 mm in short access (series 2, image 38). Enlarged aortocaval nodes measure up to 2.4 cm. Additional adenopathy extends towards the kidneys bilaterally (series 2, image 28 on the right, series 2, image 33 of the left). Right external iliac nodes measuring up to 17 mm (series 2, image 68). Left external nodes measure up to 11 mm (series 2, image 67). Possible small lymph nodes along the internal iliac chains is well, not well evaluated on this noncontrast examination. Other: No free air or fluid. Musculoskeletal: Innumerable abnormal sclerotic foci seen throughout the visualized spine and pelvis, concerning for osseous metastases. No definite significant epidural tumor appreciated. No associated pathologic fracture. IMPRESSION: 1. Irregular polypoid mass at the posterior aspect of the bladder, highly concerning for bladder carcinoma. The mass is obstructive with secondary moderate to severe bilateral hydroureteronephrosis. Urological consultation recommended. 2. Extensive bulky adenopathy throughout the abdomen and pelvis as above, consistent with nodal metastases. 3. Widespread osseous metastatic disease.  No associated pathologic fracture or other complication. Electronically Signed   By: Jeannine Boga M.D.   On: 10/16/2016 20:33   Nm Bone Scan Whole Body  Result Date: 10/20/2016 CLINICAL DATA:  History of prostate carcinoma EXAM: NUCLEAR MEDICINE WHOLE BODY BONE SCAN TECHNIQUE: Whole body anterior and posterior images were obtained approximately 3 hours after intravenous injection of radiopharmaceutical. RADIOPHARMACEUTICALS:  23.85 mCi Technetium-11m MDP IV COMPARISON:  None. FINDINGS: Is adequate uptake of radioactive tracer throughout the bony skeleton. Multifocal areas of increased activity are identified throughout the pelvis, bilateral femurs, ribcage, right scapula, thoracic, cervical and lumbar spines consistent with metastatic disease. Some degenerative changes are noted in the feet. IMPRESSION: Diffuse metastatic disease. Electronically Signed   By: Inez Catalina M.D.   On: 10/20/2016 15:21   Ir Nephrostomy Placement Left  Result Date: 10/17/2016 INDICATION: Bladder mass causing bilateral distal ureteral obstruction with high-grade bilateral hydronephrosis and acute renal failure. EXAM: IR NEPHROSTOMY PLACEMENT LEFT; IR NEPHROSTOMY PLACEMENT RIGHT COMPARISON:  None. MEDICATIONS: 2 g IV Ancef; The antibiotic was  administered in an appropriate time frame prior to skin puncture. ANESTHESIA/SEDATION: Fentanyl 25 mcg IV; Versed 0.5 mg IV Moderate Sedation Time:  30 minutes. The patient was continuously monitored during the procedure by the interventional radiology nurse under my direct supervision. CONTRAST:  26mL ISOVUE-300 IOPAMIDOL (ISOVUE-300) INJECTION 61% - administered into the collecting system(s) FLUOROSCOPY TIME:  Fluoroscopy Time: 1 minute. COMPLICATIONS: None immediate. PROCEDURE: Informed written consent was obtained from the patient's wife after a thorough discussion of the procedural risks, benefits and alternatives. All questions were addressed. Maximal Sterile Barrier Technique  was utilized including caps, mask, sterile gowns, sterile gloves, sterile drape, hand hygiene and skin antiseptic. A timeout was performed prior to the initiation of the procedure. In a prone position, both kidneys were localized by ultrasound. Overlying skin was prepped with chlorhexidine. Access of both kidneys was performed under direct ultrasound guidance with 21 gauge needles and Accustick systems. Ultrasound image documentation was performed. After confirming positioning in the collecting systems with return of urine and contrast injection, guidewires were advanced through the needles. Transitional dilators were placed. Percutaneous tracts were dilated and bilateral 10 French percutaneous nephrostomy tubes advanced over guidewires. Bilateral tubes were formed with positioning confirmed by fluoroscopic spot images obtained after injection of contrast. Both catheters were secured at the skin with Prolene retention sutures and StatLock devices. Both catheters were connected to gravity drainage bags. FINDINGS: Ultrasound confirms severe bilateral hydronephrosis. After placement of bilateral nephrostomy tubes within the renal pelvis bilaterally, there is excellent return of urine from each tube. IMPRESSION: Bilateral percutaneous nephrostomy tube placement with bilateral 10 French catheters placed and formed in the renal pelvis. Both catheters were connected to gravity drainage bags. Electronically Signed   By: [name] M.D.   On: 10/17/2016 17:08   Ir Nephrostomy Placement Right  Result Date: 10/17/2016 INDICATION: Bladder mass causing bilateral distal ureteral obstruction with high-grade bilateral hydronephrosis and acute renal failure. EXAM: IR NEPHROSTOMY PLACEMENT LEFT; IR NEPHROSTOMY PLACEMENT RIGHT COMPARISON:  None. MEDICATIONS: 2 g IV Ancef; The antibiotic was administered in an appropriate time frame prior to skin puncture. ANESTHESIA/SEDATION: Fentanyl 25 mcg IV; Versed 0.5 mg IV Moderate  Sedation Time:  30 minutes. The patient was continuously monitored during the procedure by the interventional radiology nurse under my direct supervision. CONTRAST:  53mL ISOVUE-300 IOPAMIDOL (ISOVUE-300) INJECTION 61% - administered into the collecting system(s) FLUOROSCOPY TIME:  Fluoroscopy Time: 1 minute. COMPLICATIONS: None immediate. PROCEDURE: Informed written consent was obtained from the patient's wife after a thorough discussion of the procedural risks, benefits and alternatives. All questions were addressed. Maximal Sterile Barrier Technique was utilized including caps, mask, sterile gowns, sterile gloves, sterile drape, hand hygiene and skin antiseptic. A timeout was performed prior to the initiation of the procedure. In a prone position, both kidneys were localized by ultrasound. Overlying skin was prepped with chlorhexidine. Access of both kidneys was performed under direct ultrasound guidance with 21 gauge needles and Accustick systems. Ultrasound image documentation was performed. After confirming positioning in the collecting systems with return of urine and contrast injection, guidewires were advanced through the needles. Transitional dilators were placed. Percutaneous tracts were dilated and bilateral 10 French percutaneous nephrostomy tubes advanced over guidewires. Bilateral tubes were formed with positioning confirmed by fluoroscopic spot images obtained after injection of contrast. Both catheters were secured at the skin with Prolene retention sutures and StatLock devices. Both catheters were connected to gravity drainage bags. FINDINGS: Ultrasound confirms severe bilateral hydronephrosis. After placement of bilateral nephrostomy tubes within the  renal pelvis bilaterally, there is excellent return of urine from each tube. IMPRESSION: Bilateral percutaneous nephrostomy tube placement with bilateral 10 French catheters placed and formed in the renal pelvis. Both catheters were connected to  gravity drainage bags. Electronically Signed   By: [name] M.D.   On: 10/17/2016 17:08    Assessment and plan- Patient is a 82 y.o. male with a history of presumptive diagnosis of metastatic prostate cancer with bone and lymph node metastases  1.  I spoke with the patient in detail about the findings of his CT scan. Given the clinical scenario, elevated PSA and radiology findings most likely the patient does have metastatic prostate cancer. However we do not have a pathologic diagnosis yet. I spoke to patient's urologist Dr.Budzyn today. He has agreed to do a prostate biopsy for pathologic confirmation as well as to assess the Gleason score for more prognostic information. We will also get a CT chest to complete his staging workup. Currently he is completing palliative radiation to his left hip which will continue for the next 2 weeks. I will see him back after the prostate biopsy results are back to discuss further management options. If he does have prostate cancer- he does meet criteria for high risk disease given multiple bone metastases and extensive lymph node involvement. Both abiraterone or  docetaxel would be reasonable options to consider in his case based on the results of LATITUDE trial for abiraterone and CHAARTED and STAMPEDE trial for docetaxel. The patient is currently inclined to avoid chemotherapy at this time if possible and is leaning towards considering abiraterone. Both these drugs have not been compared head to head but have been compared to a ADT alone and have been shown to significantly improve overall survival. I will discuss these trials with him in more detail after I see him back in about 2 weeks' time after the results of his prostate biopsy are back. We will check CBC, CMP, PSA and serum free testosterone levels at that time. Given that he has castration sensitive disease, he will not be a candidate for Zometa. In the same token I do think Trudi Ida would be indicated at  this time as well but I would defer that decision to rad Onc. He will continue to get Firmagon shots on a monthly basis.    Total face to face encounter time for this patient visit was 45 min. >50% of the time was  spent in counseling and coordination of care.     Visit Diagnosis 1. Prostate cancer [facility])   2. Bladder mass     Dr. Randa Evens, MD, MPH [facility] Pager- [phone] 11/05/2016 8:43 AM

## 2016-11-05 NOTE — Telephone Encounter (Signed)
Called the house and spoke to pt's wife and she states she is with him all the time and he is in pain. I spoke to her about using fentanyl patch and if they are putting it on a meaty part of his body and she puts it on his shoulders and he is meaty there she states.  I told her that I spoke to [name] and she states to change him from norco -to stop taking it and will put him on oxycodone 10 mg every 4 hours and asked if he was on any steroids and told it would be prednisone or decadron and wife states no he is not on any.  I told that Dr [name] said to put him on decadron 4 mg bid with food and see if that helps also. I will leave rx for pain at front desk so when pt comes this afternoon at 4:45 he can pick it up.  She is agreeable to this and also wanted to check to see if we can order ct chest for him.  I know he had ct but it was abd. And pelvis and needs to include chest to cover all the bases.  She is agreeable and I will put time and date in envelope with pain rx and wife agreeable to this and I will check back with them next tues to see if pain med helping

## 2016-11-08 ENCOUNTER — Telehealth: Payer: Self-pay | Admitting: *Deleted

## 2016-11-08 ENCOUNTER — Ambulatory Visit
Admission: RE | Admit: 2016-11-08 | Discharge: 2016-11-08 | Disposition: A | Discharge status: Home / Self Care | Payer: Medicare Other | Source: Ambulatory Visit | Attending: Radiation Oncology | Admitting: Radiation Oncology

## 2016-11-08 DIAGNOSIS — C7951 Secondary malignant neoplasm of bone: Secondary | ICD-10-CM | POA: Diagnosis not present

## 2016-11-08 NOTE — Telephone Encounter (Signed)
Called and left message that wanted to check on pain level since the change in pain med on fri/ will await call back.

## 2016-11-08 NOTE — Telephone Encounter (Signed)
Called to ask if pain medicine change on Friday is working better he states and he does say that left knee is the problem that is really hurting him the worse and he has things he needs to do and he needs to be pain free in the knee for about a week.  I asked him to see if he has tried icy hot on the knee and he states that it does not work. I asked if he has arthritis in the knee and he says he has never been told this.  I told him I would check with dr. [name] and let him know tom.

## 2016-11-09 ENCOUNTER — Telehealth: Payer: Self-pay | Admitting: Urology

## 2016-11-09 ENCOUNTER — Ambulatory Visit
Admission: RE | Admit: 2016-11-09 | Discharge: 2016-11-09 | Disposition: A | Discharge status: Home / Self Care | Payer: Medicare Other | Source: Ambulatory Visit | Attending: Radiation Oncology | Admitting: Radiation Oncology

## 2016-11-09 ENCOUNTER — Inpatient Hospital Stay: Payer: Medicare Other

## 2016-11-09 DIAGNOSIS — C7951 Secondary malignant neoplasm of bone: Secondary | ICD-10-CM | POA: Diagnosis not present

## 2016-11-09 NOTE — Telephone Encounter (Signed)
I received a call from the cancer center about [place] doing a biopsy on this patient, but I have not heard anything from you about this. Does he need one? And what all do you want him to have?  Please advise and let me know.  Thanks,  [facility] [phone]  [phone]

## 2016-11-10 ENCOUNTER — Ambulatory Visit
Admission: RE | Admit: 2016-11-10 | Discharge: 2016-11-10 | Disposition: A | Discharge status: Home / Self Care | Payer: Medicare Other | Source: Ambulatory Visit | Attending: Radiation Oncology | Admitting: Radiation Oncology

## 2016-11-10 DIAGNOSIS — C7951 Secondary malignant neoplasm of bone: Secondary | ICD-10-CM | POA: Diagnosis not present

## 2016-11-11 ENCOUNTER — Other Ambulatory Visit: Payer: Self-pay | Admitting: Urology

## 2016-11-11 ENCOUNTER — Ambulatory Visit
Admission: RE | Admit: 2016-11-11 | Discharge: 2016-11-11 | Disposition: A | Discharge status: Home / Self Care | Payer: Medicare Other | Source: Ambulatory Visit | Attending: Radiation Oncology | Admitting: Radiation Oncology

## 2016-11-11 ENCOUNTER — Ambulatory Visit
Admission: RE | Admit: 2016-11-11 | Discharge: 2016-11-11 | Disposition: A | Discharge status: Home / Self Care | Payer: Medicare Other | Source: Ambulatory Visit | Attending: Oncology | Admitting: Oncology

## 2016-11-11 DIAGNOSIS — C61 Malignant neoplasm of prostate: Secondary | ICD-10-CM

## 2016-11-11 DIAGNOSIS — C7951 Secondary malignant neoplasm of bone: Secondary | ICD-10-CM | POA: Diagnosis not present

## 2016-11-11 DIAGNOSIS — N3289 Other specified disorders of bladder: Secondary | ICD-10-CM | POA: Insufficient documentation

## 2016-11-11 DIAGNOSIS — J439 Emphysema, unspecified: Secondary | ICD-10-CM | POA: Diagnosis not present

## 2016-11-11 NOTE — Progress Notes (Signed)
I was called by the cancer center who requested a prostate biopsy for definitive tissue diagnosis for the patient's prostate cancer. He has been treated ready with firmagon already based on a presumptive diagnosis of prostate cancer with a PSA greater than 400, diffuse adenopathy, bladder mass, and grossly abnormal rectal exam. The cancers centers requesting definitive tissue specimens prior to any further more aggressive treatment.  I was unable to reach the patient directly. I was able to discuss a prostate biopsy with the patient's son. We discussed risks, benefits, indications of this procedure. He understands the risks include bleeding and infection. He was informed that his father was blood in his stool, urine, and semen. He also understands a 1-2% risk of infection requiring IV antibiotics.  He'll be due for his next round of androgen deprivation therapy in early February 2018.

## 2016-11-12 ENCOUNTER — Ambulatory Visit
Admission: RE | Admit: 2016-11-12 | Discharge: 2016-11-12 | Disposition: A | Discharge status: Home / Self Care | Payer: Medicare Other | Source: Ambulatory Visit | Attending: Radiation Oncology | Admitting: Radiation Oncology

## 2016-11-12 DIAGNOSIS — C7951 Secondary malignant neoplasm of bone: Secondary | ICD-10-CM | POA: Diagnosis not present

## 2016-11-15 ENCOUNTER — Ambulatory Visit
Admission: RE | Admit: 2016-11-15 | Discharge: 2016-11-15 | Disposition: A | Discharge status: Home / Self Care | Payer: Medicare Other | Source: Ambulatory Visit | Attending: Radiation Oncology | Admitting: Radiation Oncology

## 2016-11-15 DIAGNOSIS — C7951 Secondary malignant neoplasm of bone: Secondary | ICD-10-CM | POA: Diagnosis not present

## 2016-11-16 ENCOUNTER — Ambulatory Visit
Admission: RE | Admit: 2016-11-16 | Discharge: 2016-11-16 | Disposition: A | Discharge status: Home / Self Care | Payer: Medicare Other | Source: Ambulatory Visit | Attending: Radiation Oncology | Admitting: Radiation Oncology

## 2016-11-16 DIAGNOSIS — C7951 Secondary malignant neoplasm of bone: Secondary | ICD-10-CM | POA: Diagnosis not present

## 2016-11-18 ENCOUNTER — Other Ambulatory Visit: Payer: Medicare Other

## 2016-11-18 ENCOUNTER — Ambulatory Visit: Payer: Medicare Other | Admitting: Oncology

## 2016-11-19 ENCOUNTER — Other Ambulatory Visit: Payer: Self-pay | Admitting: *Deleted

## 2016-11-19 ENCOUNTER — Inpatient Hospital Stay: Payer: Medicare Other

## 2016-11-19 ENCOUNTER — Other Ambulatory Visit: Payer: Self-pay

## 2016-11-19 ENCOUNTER — Inpatient Hospital Stay (HOSPITAL_BASED_OUTPATIENT_CLINIC_OR_DEPARTMENT_OTHER): Payer: Medicare Other | Admitting: Oncology

## 2016-11-19 ENCOUNTER — Encounter: Payer: Self-pay | Admitting: Oncology

## 2016-11-19 DIAGNOSIS — C61 Malignant neoplasm of prostate: Secondary | ICD-10-CM

## 2016-11-19 DIAGNOSIS — R531 Weakness: Secondary | ICD-10-CM

## 2016-11-19 DIAGNOSIS — Z87891 Personal history of nicotine dependence: Secondary | ICD-10-CM

## 2016-11-19 DIAGNOSIS — E875 Hyperkalemia: Secondary | ICD-10-CM

## 2016-11-19 DIAGNOSIS — N401 Enlarged prostate with lower urinary tract symptoms: Secondary | ICD-10-CM

## 2016-11-19 DIAGNOSIS — R599 Enlarged lymph nodes, unspecified: Secondary | ICD-10-CM

## 2016-11-19 DIAGNOSIS — R110 Nausea: Secondary | ICD-10-CM

## 2016-11-19 DIAGNOSIS — I2510 Atherosclerotic heart disease of native coronary artery without angina pectoris: Secondary | ICD-10-CM

## 2016-11-19 DIAGNOSIS — K5730 Diverticulosis of large intestine without perforation or abscess without bleeding: Secondary | ICD-10-CM

## 2016-11-19 DIAGNOSIS — C7951 Secondary malignant neoplasm of bone: Secondary | ICD-10-CM

## 2016-11-19 DIAGNOSIS — N329 Bladder disorder, unspecified: Secondary | ICD-10-CM

## 2016-11-19 DIAGNOSIS — D649 Anemia, unspecified: Secondary | ICD-10-CM

## 2016-11-19 DIAGNOSIS — Z801 Family history of malignant neoplasm of trachea, bronchus and lung: Secondary | ICD-10-CM

## 2016-11-19 DIAGNOSIS — I517 Cardiomegaly: Secondary | ICD-10-CM

## 2016-11-19 DIAGNOSIS — I959 Hypotension, unspecified: Secondary | ICD-10-CM | POA: Diagnosis not present

## 2016-11-19 DIAGNOSIS — I10 Essential (primary) hypertension: Secondary | ICD-10-CM

## 2016-11-19 DIAGNOSIS — N131 Hydronephrosis with ureteral stricture, not elsewhere classified: Secondary | ICD-10-CM

## 2016-11-19 DIAGNOSIS — N189 Chronic kidney disease, unspecified: Secondary | ICD-10-CM

## 2016-11-19 DIAGNOSIS — D631 Anemia in chronic kidney disease: Secondary | ICD-10-CM

## 2016-11-19 DIAGNOSIS — M2550 Pain in unspecified joint: Secondary | ICD-10-CM

## 2016-11-19 DIAGNOSIS — C779 Secondary and unspecified malignant neoplasm of lymph node, unspecified: Secondary | ICD-10-CM

## 2016-11-19 DIAGNOSIS — M545 Low back pain: Secondary | ICD-10-CM

## 2016-11-19 DIAGNOSIS — J45909 Unspecified asthma, uncomplicated: Secondary | ICD-10-CM

## 2016-11-19 DIAGNOSIS — R5383 Other fatigue: Secondary | ICD-10-CM

## 2016-11-19 DIAGNOSIS — N3289 Other specified disorders of bladder: Secondary | ICD-10-CM

## 2016-11-19 DIAGNOSIS — J439 Emphysema, unspecified: Secondary | ICD-10-CM

## 2016-11-19 DIAGNOSIS — N179 Acute kidney failure, unspecified: Secondary | ICD-10-CM

## 2016-11-19 DIAGNOSIS — Z79899 Other long term (current) drug therapy: Secondary | ICD-10-CM

## 2016-11-19 LAB — FERRITIN: FERRITIN: 93 ng/mL (ref 24–336)

## 2016-11-19 LAB — COMPREHENSIVE METABOLIC PANEL
ALT: 10 U/L — ABNORMAL LOW (ref 17–63)
AST: 13 U/L — AB (ref 15–41)
Albumin: 3.1 g/dL — ABNORMAL LOW (ref 3.5–5.0)
Alkaline Phosphatase: 262 U/L — ABNORMAL HIGH (ref 38–126)
Anion gap: 8 (ref 5–15)
BUN: 59 mg/dL — ABNORMAL HIGH (ref 6–20)
CHLORIDE: 104 mmol/L (ref 101–111)
CO2: 21 mmol/L — AB (ref 22–32)
Calcium: 8.1 mg/dL — ABNORMAL LOW (ref 8.9–10.3)
Creatinine, Ser: 3.02 mg/dL — ABNORMAL HIGH (ref 0.61–1.24)
GFR, EST AFRICAN AMERICAN: 21 mL/min — AB (ref >60)
GFR, EST NON AFRICAN AMERICAN: 18 mL/min — AB (ref >60)
Glucose, Bld: 126 mg/dL — ABNORMAL HIGH (ref 65–99)
POTASSIUM: 4.6 mmol/L (ref 3.5–5.1)
SODIUM: 133 mmol/L — AB (ref 135–145)
Total Bilirubin: 0.3 mg/dL (ref 0.3–1.2)
Total Protein: 6.3 g/dL — ABNORMAL LOW (ref 6.5–8.1)

## 2016-11-19 LAB — CBC WITH DIFFERENTIAL/PLATELET
BASOS ABS: 0 10*3/uL (ref 0–0.1)
Basophils Relative: 0 %
EOS PCT: 7 %
Eosinophils Absolute: 0.4 10*3/uL (ref 0–0.7)
HCT: 26.2 % — ABNORMAL LOW (ref 40.0–52.0)
Hemoglobin: 8.7 g/dL — ABNORMAL LOW (ref 13.0–18.0)
LYMPHS PCT: 6 %
Lymphs Abs: 0.3 10*3/uL — ABNORMAL LOW (ref 1.0–3.6)
MCH: 29.6 pg (ref 26.0–34.0)
MCHC: 33.2 g/dL (ref 32.0–36.0)
MCV: 89.1 fL (ref 80.0–100.0)
MONO ABS: 0.7 10*3/uL (ref 0.2–1.0)
Monocytes Relative: 12 %
Neutro Abs: 4 10*3/uL (ref 1.4–6.5)
Neutrophils Relative %: 75 %
PLATELETS: 161 10*3/uL (ref 150–440)
RBC: 2.94 MIL/uL — AB (ref 4.40–5.90)
RDW: 14.7 % — ABNORMAL HIGH (ref 11.5–14.5)
WBC: 5.4 10*3/uL (ref 3.8–10.6)

## 2016-11-19 LAB — FOLATE: FOLATE: 7.7 ng/mL (ref >5.9)

## 2016-11-19 LAB — IRON AND TIBC
IRON: 28 ug/dL — AB (ref 45–182)
SATURATION RATIOS: 12 % — AB (ref 17.9–39.5)
TIBC: 227 ug/dL — ABNORMAL LOW (ref 250–450)
UIBC: 199 ug/dL

## 2016-11-19 MED ORDER — SODIUM CHLORIDE 0.9 % IV SOLN
INTRAVENOUS | Status: AC
  Administered 2016-11-19: 15:00:00 via INTRAVENOUS
  Filled 2016-11-19: qty 1000

## 2016-11-19 MED ORDER — PROMETHAZINE HCL 25 MG PO TABS: 12.5000 mg | ORAL_TABLET | Freq: Once | ORAL | Status: DC | PRN

## 2016-11-19 MED ORDER — OXYCODONE HCL 10 MG PO TABS: 10.0000 mg | ORAL_TABLET | ORAL | 0 refills | Status: DC | PRN

## 2016-11-19 MED ORDER — FENTANYL 25 MCG/HR TD PT72: 25.0000 ug | MEDICATED_PATCH | TRANSDERMAL | 0 refills | Status: DC

## 2016-11-19 NOTE — Progress Notes (Signed)
Pt states that he feels like he is drinking fluids but not eatin as much. Went to see urologist and they gave him kayexyate due to potassium being high. She is sometimes unsteady walking-has cane,. B/p low today

## 2016-11-19 NOTE — Patient Instructions (Signed)
Stop lisinopril until you see Dr. [name] or if Dr. Ubaldo Glassing says any different

## 2016-11-20 ENCOUNTER — Emergency Department: Payer: Medicare Other

## 2016-11-20 ENCOUNTER — Inpatient Hospital Stay
Admission: EM | Admit: 2016-11-20 | Discharge: 2016-11-23 | DRG: 689 | Disposition: A | Discharge status: Home / Self Care | Payer: Medicare Other | Attending: Internal Medicine | Admitting: Internal Medicine

## 2016-11-20 ENCOUNTER — Other Ambulatory Visit: Payer: Self-pay

## 2016-11-20 ENCOUNTER — Other Ambulatory Visit: Payer: Self-pay | Admitting: Radiology

## 2016-11-20 DIAGNOSIS — C61 Malignant neoplasm of prostate: Secondary | ICD-10-CM

## 2016-11-20 DIAGNOSIS — R339 Retention of urine, unspecified: Secondary | ICD-10-CM

## 2016-11-20 DIAGNOSIS — G6289 Other specified polyneuropathies: Secondary | ICD-10-CM

## 2016-11-20 DIAGNOSIS — Z87891 Personal history of nicotine dependence: Secondary | ICD-10-CM

## 2016-11-20 DIAGNOSIS — N131 Hydronephrosis with ureteral stricture, not elsewhere classified: Secondary | ICD-10-CM

## 2016-11-20 DIAGNOSIS — M6281 Muscle weakness (generalized): Secondary | ICD-10-CM

## 2016-11-20 DIAGNOSIS — N390 Urinary tract infection, site not specified: Principal | ICD-10-CM | POA: Diagnosis present

## 2016-11-20 DIAGNOSIS — T402X5A Adverse effect of other opioids, initial encounter: Secondary | ICD-10-CM | POA: Diagnosis present

## 2016-11-20 DIAGNOSIS — Z936 Other artificial openings of urinary tract status: Secondary | ICD-10-CM

## 2016-11-20 DIAGNOSIS — N1330 Unspecified hydronephrosis: Secondary | ICD-10-CM | POA: Diagnosis present

## 2016-11-20 DIAGNOSIS — Z801 Family history of malignant neoplasm of trachea, bronchus and lung: Secondary | ICD-10-CM

## 2016-11-20 DIAGNOSIS — R4182 Altered mental status, unspecified: Secondary | ICD-10-CM

## 2016-11-20 DIAGNOSIS — Z9889 Other specified postprocedural states: Secondary | ICD-10-CM

## 2016-11-20 DIAGNOSIS — D509 Iron deficiency anemia, unspecified: Secondary | ICD-10-CM | POA: Diagnosis present

## 2016-11-20 DIAGNOSIS — K5903 Drug induced constipation: Secondary | ICD-10-CM | POA: Diagnosis present

## 2016-11-20 DIAGNOSIS — N132 Hydronephrosis with renal and ureteral calculous obstruction: Secondary | ICD-10-CM

## 2016-11-20 DIAGNOSIS — Z8249 Family history of ischemic heart disease and other diseases of the circulatory system: Secondary | ICD-10-CM

## 2016-11-20 DIAGNOSIS — R410 Disorientation, unspecified: Secondary | ICD-10-CM

## 2016-11-20 DIAGNOSIS — Z66 Do not resuscitate: Secondary | ICD-10-CM | POA: Diagnosis present

## 2016-11-20 DIAGNOSIS — G9341 Metabolic encephalopathy: Secondary | ICD-10-CM | POA: Diagnosis present

## 2016-11-20 DIAGNOSIS — Z923 Personal history of irradiation: Secondary | ICD-10-CM

## 2016-11-20 DIAGNOSIS — B965 Pseudomonas (aeruginosa) (mallei) (pseudomallei) as the cause of diseases classified elsewhere: Secondary | ICD-10-CM | POA: Diagnosis present

## 2016-11-20 DIAGNOSIS — G893 Neoplasm related pain (acute) (chronic): Secondary | ICD-10-CM | POA: Diagnosis present

## 2016-11-20 DIAGNOSIS — R2681 Unsteadiness on feet: Secondary | ICD-10-CM

## 2016-11-20 DIAGNOSIS — I959 Hypotension, unspecified: Secondary | ICD-10-CM

## 2016-11-20 DIAGNOSIS — Z955 Presence of coronary angioplasty implant and graft: Secondary | ICD-10-CM

## 2016-11-20 DIAGNOSIS — I129 Hypertensive chronic kidney disease with stage 1 through stage 4 chronic kidney disease, or unspecified chronic kidney disease: Secondary | ICD-10-CM | POA: Diagnosis present

## 2016-11-20 DIAGNOSIS — E860 Dehydration: Secondary | ICD-10-CM

## 2016-11-20 DIAGNOSIS — Z79899 Other long term (current) drug therapy: Secondary | ICD-10-CM

## 2016-11-20 DIAGNOSIS — C7951 Secondary malignant neoplasm of bone: Secondary | ICD-10-CM

## 2016-11-20 DIAGNOSIS — I2510 Atherosclerotic heart disease of native coronary artery without angina pectoris: Secondary | ICD-10-CM | POA: Diagnosis present

## 2016-11-20 DIAGNOSIS — G9340 Encephalopathy, unspecified: Secondary | ICD-10-CM

## 2016-11-20 DIAGNOSIS — N181 Chronic kidney disease, stage 1: Secondary | ICD-10-CM | POA: Diagnosis present

## 2016-11-20 LAB — CBC WITH DIFFERENTIAL/PLATELET
Basophils Absolute: 0 10*3/uL (ref 0–0.1)
Basophils Relative: 0 %
Eosinophils Absolute: 0.2 10*3/uL (ref 0–0.7)
Eosinophils Relative: 4 %
HEMATOCRIT: 23.9 % — AB (ref 40.0–52.0)
HEMOGLOBIN: 7.9 g/dL — AB (ref 13.0–18.0)
LYMPHS ABS: 0.3 10*3/uL — AB (ref 1.0–3.6)
LYMPHS PCT: 6 %
MCH: 29.5 pg (ref 26.0–34.0)
MCHC: 33 g/dL (ref 32.0–36.0)
MCV: 89.5 fL (ref 80.0–100.0)
Monocytes Absolute: 0.6 10*3/uL (ref 0.2–1.0)
Monocytes Relative: 14 %
NEUTROS ABS: 3.3 10*3/uL (ref 1.4–6.5)
NEUTROS PCT: 76 %
PLATELETS: 155 10*3/uL (ref 150–440)
RBC: 2.67 MIL/uL — AB (ref 4.40–5.90)
RDW: 15 % — ABNORMAL HIGH (ref 11.5–14.5)
WBC: 4.5 10*3/uL (ref 3.8–10.6)

## 2016-11-20 LAB — URINALYSIS, COMPLETE (UACMP) WITH MICROSCOPIC
BILIRUBIN URINE: NEGATIVE
Glucose, UA: NEGATIVE mg/dL
KETONES UR: NEGATIVE mg/dL
Nitrite: NEGATIVE
PH: 5 (ref 5.0–8.0)
PROTEIN: 30 mg/dL — AB
SQUAMOUS EPITHELIAL / LPF: NONE SEEN
Specific Gravity, Urine: 1.013 (ref 1.005–1.030)

## 2016-11-20 LAB — COMPREHENSIVE METABOLIC PANEL
ALT: 10 U/L — ABNORMAL LOW (ref 17–63)
ANION GAP: 7 (ref 5–15)
AST: 10 U/L — ABNORMAL LOW (ref 15–41)
Albumin: 2.7 g/dL — ABNORMAL LOW (ref 3.5–5.0)
Alkaline Phosphatase: 229 U/L — ABNORMAL HIGH (ref 38–126)
BILIRUBIN TOTAL: 0.5 mg/dL (ref 0.3–1.2)
BUN: 57 mg/dL — ABNORMAL HIGH (ref 6–20)
CHLORIDE: 105 mmol/L (ref 101–111)
CO2: 20 mmol/L — ABNORMAL LOW (ref 22–32)
Calcium: 7.9 mg/dL — ABNORMAL LOW (ref 8.9–10.3)
Creatinine, Ser: 2.91 mg/dL — ABNORMAL HIGH (ref 0.61–1.24)
GFR calc Af Amer: 22 mL/min — ABNORMAL LOW (ref >60)
GFR, EST NON AFRICAN AMERICAN: 19 mL/min — AB (ref >60)
Glucose, Bld: 119 mg/dL — ABNORMAL HIGH (ref 65–99)
POTASSIUM: 4.7 mmol/L (ref 3.5–5.1)
Sodium: 132 mmol/L — ABNORMAL LOW (ref 135–145)
TOTAL PROTEIN: 5.9 g/dL — AB (ref 6.5–8.1)

## 2016-11-20 LAB — LACTIC ACID, PLASMA
LACTIC ACID, VENOUS: 0.9 mmol/L (ref 0.5–1.9)
LACTIC ACID, VENOUS: 0.8 mmol/L (ref 0.5–1.9)

## 2016-11-20 MED ORDER — MAGNESIUM CITRATE PO SOLN
1.0000 | Freq: Once | ORAL | Status: AC
  Administered 2016-11-20: 1 via ORAL
  Filled 2016-11-20: qty 296

## 2016-11-20 MED ORDER — MORPHINE SULFATE (PF) 2 MG/ML IV SOLN
1.0000 mg | INTRAVENOUS | Status: DC | PRN
Start: 2016-11-20 — End: 2016-11-22
  Administered 2016-11-21 – 2016-11-22 (×4): 1 mg via INTRAVENOUS
  Filled 2016-11-20 (×4): qty 1

## 2016-11-20 MED ORDER — ONDANSETRON HCL 4 MG PO TABS: 4.0000 mg | ORAL_TABLET | Freq: Four times a day (QID) | ORAL | Status: DC | PRN

## 2016-11-20 MED ORDER — DEXTROSE 5 % IV SOLN: 1.0000 g | Freq: Once | INTRAVENOUS | Status: DC

## 2016-11-20 MED ORDER — CEFTRIAXONE SODIUM-DEXTROSE 1-3.74 GM-% IV SOLR
1.0000 g | INTRAVENOUS | Status: DC
  Administered 2016-11-21 – 2016-11-23 (×3): 1 g via INTRAVENOUS
  Filled 2016-11-20 (×3): qty 50

## 2016-11-20 MED ORDER — ONDANSETRON HCL 4 MG/2ML IJ SOLN
4.0000 mg | Freq: Four times a day (QID) | INTRAMUSCULAR | Status: DC | PRN
  Administered 2016-11-22: 4 mg via INTRAVENOUS
  Filled 2016-11-20: qty 2

## 2016-11-20 MED ORDER — METOPROLOL TARTRATE 50 MG PO TABS: 50.0000 mg | ORAL_TABLET | Freq: Two times a day (BID) | ORAL | Status: DC

## 2016-11-20 MED ORDER — OXYCODONE HCL 5 MG PO TABS
10.0000 mg | ORAL_TABLET | ORAL | Status: DC | PRN
  Administered 2016-11-20 – 2016-11-22 (×5): 10 mg via ORAL
  Filled 2016-11-20 (×5): qty 2

## 2016-11-20 MED ORDER — CEFTRIAXONE SODIUM-DEXTROSE 1-3.74 GM-% IV SOLR
1.0000 g | Freq: Once | INTRAVENOUS | Status: AC
  Administered 2016-11-20: 1 g via INTRAVENOUS
  Filled 2016-11-20: qty 50

## 2016-11-20 MED ORDER — ACETAMINOPHEN 325 MG PO TABS
650.0000 mg | ORAL_TABLET | Freq: Four times a day (QID) | ORAL | Status: DC | PRN
  Administered 2016-11-22: 650 mg via ORAL
  Filled 2016-11-20: qty 2

## 2016-11-20 MED ORDER — BISACODYL 10 MG RE SUPP: 10.0000 mg | Freq: Every day | RECTAL | Status: DC | PRN

## 2016-11-20 MED ORDER — FENTANYL 25 MCG/HR TD PT72
25.0000 ug | MEDICATED_PATCH | TRANSDERMAL | Status: DC
  Administered 2016-11-22: 10:00:00 25 ug via TRANSDERMAL
  Filled 2016-11-20: qty 1

## 2016-11-20 MED ORDER — SENNOSIDES-DOCUSATE SODIUM 8.6-50 MG PO TABS
2.0000 | ORAL_TABLET | Freq: Two times a day (BID) | ORAL | Status: DC
Start: 2016-11-20 — End: 2016-11-23
  Administered 2016-11-20 – 2016-11-23 (×5): 2 via ORAL
  Filled 2016-11-20 (×6): qty 2

## 2016-11-20 MED ORDER — SODIUM CHLORIDE 0.9 % IV BOLUS (SEPSIS)
1000.0000 mL | Freq: Once | INTRAVENOUS | Status: AC
  Administered 2016-11-20: 1000 mL via INTRAVENOUS

## 2016-11-20 MED ORDER — OXYCODONE HCL 10 MG PO TABS: 10.0000 mg | ORAL_TABLET | ORAL | Status: DC | PRN

## 2016-11-20 MED ORDER — DEXTROSE 5 % IV SOLN: 1.0000 g | INTRAVENOUS | Status: DC

## 2016-11-20 MED ORDER — NITROGLYCERIN 0.4 MG SL SUBL: 0.4000 mg | SUBLINGUAL_TABLET | SUBLINGUAL | Status: DC | PRN

## 2016-11-20 MED ORDER — ACETAMINOPHEN 650 MG RE SUPP: 650.0000 mg | Freq: Four times a day (QID) | RECTAL | Status: DC | PRN

## 2016-11-20 MED ORDER — DEXAMETHASONE 4 MG PO TABS
4.0000 mg | ORAL_TABLET | Freq: Two times a day (BID) | ORAL | Status: DC
  Administered 2016-11-21 – 2016-11-23 (×6): 4 mg via ORAL
  Filled 2016-11-20 (×6): qty 1

## 2016-11-20 MED ORDER — POLYETHYLENE GLYCOL 3350 17 G PO PACK
17.0000 g | PACK | Freq: Two times a day (BID) | ORAL | Status: DC
  Administered 2016-11-20 – 2016-11-23 (×4): 17 g via ORAL
  Filled 2016-11-20 (×6): qty 1

## 2016-11-20 MED ORDER — SODIUM CHLORIDE 0.9 % IV SOLN
INTRAVENOUS | Status: DC
  Administered 2016-11-20 – 2016-11-22 (×5): via INTRAVENOUS

## 2016-11-20 MED ORDER — FAMOTIDINE IN NACL 20-0.9 MG/50ML-% IV SOLN
20.0000 mg | Freq: Two times a day (BID) | INTRAVENOUS | Status: DC
  Administered 2016-11-21 (×2): 20 mg via INTRAVENOUS
  Filled 2016-11-20 (×6): qty 50

## 2016-11-20 NOTE — ED Notes (Signed)
Urine bags emptied - rt for 375cc, left for 150 cc

## 2016-11-20 NOTE — ED Notes (Signed)
Urine bags emptied - both rt and left had 100cc each

## 2016-11-20 NOTE — H&P (Signed)
History and Physical    [name] [id number] DOB: [birth year] DOA: 11/20/2016  Referring physician: Dr. Quentin Cornwall PCP: No PCP Per Patient  Specialists: Dr. [name]  Chief Complaint: weakness  HPI: MONTAVIOUS Gould is a 82 y.o. male has a past medical history significant for metastatic prostate cancer on treatment now with diffuse pain with nausea and weakness. hypotensive in ER. Has nephrostomy tubes in place but no clear source of infection. BP remains low in ER despite IV fluids. He is now admitted. No vomiting or diarrhea. Has had low grade fever. Denies CP or SOB  Review of Systems: The patient denies anorexia, weight loss,, vision loss, decreased hearing, hoarseness, chest pain, syncope, dyspnea on exertion, peripheral edema, balance deficits, hemoptysis, melena, hematochezia, severe indigestion/heartburn, hematuria, incontinence, genital sores, muscle weakness, suspicious skin lesions, transient blindness, difficulty walking, depression, unusual weight change, abnormal bleeding, enlarged lymph nodes, angioedema, and breast masses.   Past Medical History:  Diagnosis Date  . CAD (coronary artery disease)   . Cancer ([place])   . Hypertension   . RAD (reactive airway disease)    Past Surgical History:  Procedure Laterality Date  . CORONARY ANGIOPLASTY WITH STENT PLACEMENT    . IR GENERIC HISTORICAL  10/17/2016   IR NEPHROSTOMY PLACEMENT RIGHT 10/17/2016 [facility] RAD  . IR GENERIC HISTORICAL  10/17/2016   IR NEPHROSTOMY PLACEMENT LEFT 10/17/2016 [name], MD [facility]  . KNEE SURGERY Left 2006   Laparascopy done on left knee, scar tissue taken out  . nephrostomy tubes Bilateral    Social History:  reports that he quit smoking about 2 years ago. He has a 69.50 pack-year smoking history. He has never used smokeless tobacco. He reports that he does not drink alcohol or use drugs.  No Known Allergies  Family History  Problem Relation Age of Onset  . Hypertension  Other   . Lung cancer Brother     Prior to Admission medications   Medication Sig Start Date End Date Taking? Authorizing Provider  fentaNYL (DURAGESIC - DOSED MCG/HR) 25 MCG/HR patch Place 1 patch (25 mcg total) onto the skin every 3 (three) days. 11/19/16  Yes Sindy Guadeloupe, MD  lisinopril (PRINIVIL,ZESTRIL) 5 MG tablet Take 5 mg by mouth daily. 08/03/16  Yes Historical Provider, MD  nitroGLYCERIN (NITROSTAT) 0.4 MG SL tablet Place 0.4 mg under the tongue every 5 (five) minutes x 3 doses as needed for chest pain. 06/23/16  Yes Historical Provider, MD  ondansetron (ZOFRAN) 4 MG tablet Take 1 tablet (4 mg total) by mouth every 6 (six) hours as needed for nausea. 11/02/16  Yes Noreene Filbert, MD  Oxycodone HCl 10 MG TABS Take 1 tablet (10 mg total) by mouth every 4 (four) hours as needed. 11/19/16  Yes Sindy Guadeloupe, MD  polyethylene glycol (MIRALAX / GLYCOLAX) packet Take 17 g by mouth daily.   Yes Historical Provider, MD  senna-docusate (SENOKOT-S) 8.6-50 MG tablet Take 2 tablets by mouth 2 (two) times daily. 10/23/16  Yes Fritzi Mandes, MD  SPS 15 GM/60ML suspension Take 60 mLs by mouth daily. 11/12/16  Yes Historical Provider, MD  dexamethasone (DECADRON) 4 MG tablet Take 1 tablet (4 mg total) by mouth 2 (two) times daily with a meal. 11/05/16   Sindy Guadeloupe, MD  metoprolol (LOPRESSOR) 50 MG tablet Take 50 mg by mouth 2 (two) times daily. 08/03/16   Historical Provider, MD   Physical Exam: Vitals:   11/20/16 1626 11/20/16 1630 11/20/16 1700 11/20/16 1730  BP: (!) 110/59 (!) 107/59 (!) 101/55 (!) 97/56  Pulse:      Resp:      Temp:      TempSrc:      SpO2:      Weight:      Height:         General:  No apparent distress, WDWN, [place]/AT  Eyes: PERRL, EOMI, no scleral icterus, conjunctiva clear  ENT: moist oropharynx without exudate, TM's benign, dentition fair  Neck: supple, no lymphadenopathy. No bruits or thyromegaly  Cardiovascular: regular rate without MRG; 2+ peripheral pulses, no JVD,  no peripheral edema  Respiratory: CTA biL, good air movement without wheezing, rhonchi or crackled. Respiratory effort normal  Abdomen: soft,  tender to palpation, positive bowel sounds, no guarding, no rebound  Skin: no rashes or lesions  Musculoskeletal: normal bulk and tone, no joint swelling  Psychiatric: normal mood and affect, A&OX3  Neurologic: CN 2-12 grossly intact, Motor strength 5/5 in all 4 groups with symmetric DTR's and non-focal sensory exam  Labs on Admission:  Basic Metabolic Panel:  Recent Labs Lab 11/19/16 1305 11/20/16 1526  NA 133* 132*  K 4.6 4.7  CL 104 105  CO2 21* 20*  GLUCOSE 126* 119*  BUN 59* 57*  CREATININE 3.02* 2.91*  CALCIUM 8.1* 7.9*   Liver Function Tests:  Recent Labs Lab 11/19/16 1305 11/20/16 1526  AST 13* 10*  ALT 10* 10*  ALKPHOS 262* 229*  BILITOT 0.3 0.5  PROT 6.3* 5.9*  ALBUMIN 3.1* 2.7*   No results for input(s): LIPASE, AMYLASE in the last 168 hours. No results for input(s): AMMONIA in the last 168 hours. CBC:  Recent Labs Lab 11/19/16 1305 11/20/16 1526  WBC 5.4 4.5  NEUTROABS 4.0 3.3  HGB 8.7* 7.9*  HCT 26.2* 23.9*  MCV 89.1 89.5  PLT 161 155   Cardiac Enzymes: No results for input(s): CKTOTAL, CKMB, CKMBINDEX, TROPONINI in the last 168 hours.  BNP (last 3 results) No results for input(s): BNP in the last 8760 hours.  ProBNP (last 3 results) No results for input(s): PROBNP in the last 8760 hours.  CBG: No results for input(s): GLUCAP in the last 168 hours.  Radiological Exams on Admission: Dg Chest 2 View  Result Date: 11/20/2016 CLINICAL DATA:  Mental status changes and hallucinations. Hypotension. History of metastatic prostate carcinoma. EXAM: CHEST  2 VIEW COMPARISON:  11/06/2012 FINDINGS: Stable ectasia and prominence of the thoracic aorta. No edema, airspace consolidation, nodule or pleural fluid identified. Bilateral nephrostomy tubes visible. Multiple sclerotic lesions are seen involving  ribs and vertebral bodies consistent with metastatic prostate carcinoma. IMPRESSION: No acute pulmonary process identified. Skeletal metastatic disease is present consistent with known metastatic prostate carcinoma. Electronically Signed   By: [name] M.D.   On: 11/20/2016 15:07   Dg Abdomen 1 View  Result Date: 11/20/2016 CLINICAL DATA:  Bilateral nephrostomy EXAM: ABDOMEN - 1 VIEW COMPARISON:  CT scan 10/16/2016 FINDINGS: There is normal small bowel gas pattern. Some colonic stool noted in right colon and descending colon. Bilateral nephrostomy catheters are partially visualized. Mild dextroscoliosis and degenerative changes lumbar spine. IMPRESSION: Bilateral nephrostomy catheters are partially visualized. Normal small bowel gas pattern. Electronically Signed   By: Lahoma Crocker M.D.   On: 11/20/2016 17:09   Ct Head Wo Contrast  Result Date: 11/20/2016 CLINICAL DATA:  Altered mental status, hallucinations EXAM: CT HEAD WITHOUT CONTRAST TECHNIQUE: Contiguous axial images were obtained from the base of the skull through the vertex without  intravenous contrast. COMPARISON:  None. FINDINGS: Brain: No intracranial hemorrhage, mass effect or midline shift. No acute cortical infarction. Mild cerebral atrophy. Mild periventricular chronic white matter disease. No mass lesion is noted on this unenhanced scan. Vascular: Atherosclerotic calcifications of carotid siphon. Skull: No skull fracture is noted. Sinuses/Orbits: No acute findings Other: None IMPRESSION: No acute intracranial abnormality. Mild cerebral atrophy. No acute cortical infarction. Electronically Signed   By: Lahoma Crocker M.D.   On: 11/20/2016 15:09    EKG: Independently reviewed.  Assessment/Plan Principal Problem:   Hypotension Active Problems:   Dehydration   Urinary retention   Prostate cancer metastatic to bone ([place])   Will observe on floor with IV fluids and empiric IV ABX. Cultures sent. Consult Oncology and Urology. Repeat  labs in AM.  Diet: clear liquids Fluids: NS@125  DVT Prophylaxis: TED hose  Code Status: FULL  Family Communication: yes  Disposition Plan: home  Time spent: 50 min

## 2016-11-20 NOTE — ED Triage Notes (Signed)
Pt arrives to ER via POV with c/o AMS and hallucinations per family. PT CA patient. Pt seen at oncologist yesterday for low BP and got IV fluids at that time.

## 2016-11-20 NOTE — ED Provider Notes (Signed)
[facility] Emergency Department Provider Note    First MD Initiated Contact with Patient 11/20/16 1428     (approximate)  I have reviewed the triage vital signs and the nursing notes.   HISTORY  Chief Complaint Altered Mental Status    HPI Johnathan Gould is a 82 y.o. male with active metastatic cancer, presumably prostate in origin, with recent completion of radiation therapy presents for altered mental status and dehydration. Patient was seen in oncology clinic yesterday, was found to be hypotensive and was given a liter of IV fluids with improvement. Patient was seen today by sons that are currently at bedside who state the patient was altered "mumbling nonsensical sentences and reaching for objects that were not present. They've never seen him behave this way before. No medication changes. No measured fevers. No complaint of new chest pain, nausea, vomiting, diarrhea.  He does admit to decreased oral intake. He has bilateral nephrostomy tubes that have been in place without any changes. Denies any pain from nephrostomy tube.   Past Medical History:  Diagnosis Date  . CAD (coronary artery disease)   . Cancer ([place])   . Hypertension   . RAD (reactive airway disease)    Family History  Problem Relation Age of Onset  . Hypertension Other   . Lung cancer Brother    Past Surgical History:  Procedure Laterality Date  . CORONARY ANGIOPLASTY WITH STENT PLACEMENT    . IR GENERIC HISTORICAL  10/17/2016   IR NEPHROSTOMY PLACEMENT RIGHT 10/17/2016 [facility]  . IR GENERIC HISTORICAL  10/17/2016   IR NEPHROSTOMY PLACEMENT LEFT 10/17/2016 [name], MD [facility]  . KNEE SURGERY Left 2006   Laparascopy done on left knee, scar tissue taken out  . nephrostomy tubes Bilateral    Patient Active Problem List   Diagnosis Date Noted  . Hypotension 11/20/2016  . Dehydration 11/20/2016  . Urinary retention 11/20/2016  . Prostate cancer metastatic  to bone ([place]) 11/20/2016  . Prostate cancer ([place])   . Urinary obstruction 10/16/2016  . Acute renal failure (ARF) ([place]) 10/16/2016  . Bladder mass 10/16/2016  . CAD (coronary artery disease) 10/16/2016  . HTN (hypertension) 10/16/2016      Prior to Admission medications   Medication Sig Start Date End Date Taking? Authorizing Provider  fentaNYL (DURAGESIC - DOSED MCG/HR) 25 MCG/HR patch Place 1 patch (25 mcg total) onto the skin every 3 (three) days. 11/19/16  Yes Sindy Guadeloupe, MD  lisinopril (PRINIVIL,ZESTRIL) 5 MG tablet Take 5 mg by mouth daily. 08/03/16  Yes Historical Provider, MD  nitroGLYCERIN (NITROSTAT) 0.4 MG SL tablet Place 0.4 mg under the tongue every 5 (five) minutes x 3 doses as needed for chest pain. 06/23/16  Yes Historical Provider, MD  ondansetron (ZOFRAN) 4 MG tablet Take 1 tablet (4 mg total) by mouth every 6 (six) hours as needed for nausea. 11/02/16  Yes Noreene Filbert, MD  Oxycodone HCl 10 MG TABS Take 1 tablet (10 mg total) by mouth every 4 (four) hours as needed. 11/19/16  Yes Sindy Guadeloupe, MD  polyethylene glycol (MIRALAX / GLYCOLAX) packet Take 17 g by mouth daily.   Yes Historical Provider, MD  senna-docusate (SENOKOT-S) 8.6-50 MG tablet Take 2 tablets by mouth 2 (two) times daily. 10/23/16  Yes Fritzi Mandes, MD  SPS 15 GM/60ML suspension Take 60 mLs by mouth daily. 11/12/16  Yes Historical Provider, MD  dexamethasone (DECADRON) 4 MG tablet Take 1 tablet (4 mg total) by  mouth 2 (two) times daily with a meal. 11/05/16   Sindy Guadeloupe, MD  metoprolol (LOPRESSOR) 50 MG tablet Take 50 mg by mouth 2 (two) times daily. 08/03/16   Historical Provider, MD    Allergies Patient has no known allergies.    Social History Social History  Substance Use Topics  . Smoking status: Former Smoker    Packs/day: 1.00    Years: 69.50    Quit date: 11/04/2014  . Smokeless tobacco: Never Used  . Alcohol use No    Review of Systems Patient denies headaches, rhinorrhea, blurry  vision, numbness, shortness of breath, chest pain, edema, cough, abdominal pain, nausea, vomiting, diarrhea, dysuria, fevers, rashes or hallucinations unless otherwise stated above in HPI. ____________________________________________   PHYSICAL EXAM:  VITAL SIGNS: Vitals:   11/20/16 2118 11/21/16 0444  BP: (!) 104/54 (!) 75/56  Pulse: (!) 104 85  Resp:    Temp: 99.8 F (37.7 C) 98.7 F (37.1 C)    Constitutional: Alert and oriented. Ill and frail appearing but in no acute distress. Eyes: Conjunctivae are normal. PERRL. EOMI. Head: Atraumatic. Nose: No congestion/rhinnorhea. Mouth/Throat: Mucous membranes are moist.  Oropharynx non-erythematous. Neck: No stridor. Painless ROM. No cervical spine tenderness to palpation Hematological/Lymphatic/Immunilogical: No cervical lymphadenopathy. Cardiovascular: Normal rate, regular rhythm. Grossly normal heart sounds.  Good peripheral circulation. Respiratory: Normal respiratory effort.  No retractions. Lungs CTAB. Gastrointestinal: Soft and nontender. No distention. No abdominal bruits. No CVA tenderness. Musculoskeletal: No lower extremity tenderness nor edema.  No joint effusions.  Bilateral nephrostomy tubes c/d/i Neurologic:  Normal speech and language. No gross focal neurologic deficits are appreciated. No gait instability. Skin:  Skin is warm, dry and intact. No rash noted. Psychiatric: Mood and affect are normal. Speech and behavior are normal.  ____________________________________________   LABS (all labs ordered are listed, but only abnormal results are displayed)  Results for orders placed or performed during the hospital encounter of 11/20/16 (from the past 24 hour(s))  Lactic acid, plasma     Status: None   Collection Time: 11/20/16  3:26 PM  Result Value Ref Range   Lactic Acid, Venous 0.9 0.5 - 1.9 mmol/L  Comprehensive metabolic panel     Status: Abnormal   Collection Time: 11/20/16  3:26 PM  Result Value Ref Range    Sodium 132 (L) 135 - 145 mmol/L   Potassium 4.7 3.5 - 5.1 mmol/L   Chloride 105 101 - 111 mmol/L   CO2 20 (L) 22 - 32 mmol/L   Glucose, Bld 119 (H) 65 - 99 mg/dL   BUN 57 (H) 6 - 20 mg/dL   Creatinine, Ser 2.91 (H) 0.61 - 1.24 mg/dL   Calcium 7.9 (L) 8.9 - 10.3 mg/dL   Total Protein 5.9 (L) 6.5 - 8.1 g/dL   Albumin 2.7 (L) 3.5 - 5.0 g/dL   AST 10 (L) 15 - 41 U/L   ALT 10 (L) 17 - 63 U/L   Alkaline Phosphatase 229 (H) 38 - 126 U/L   Total Bilirubin 0.5 0.3 - 1.2 mg/dL   GFR calc non Af Amer 19 (L) >60 mL/min   GFR calc Af Amer 22 (L) >60 mL/min   Anion gap 7 5 - 15  CBC with Differential     Status: Abnormal   Collection Time: 11/20/16  3:26 PM  Result Value Ref Range   WBC 4.5 3.8 - 10.6 K/uL   RBC 2.67 (L) 4.40 - 5.90 MIL/uL   Hemoglobin 7.9 (L) 13.0 - 18.0  g/dL   HCT 23.9 (L) 40.0 - 52.0 %   MCV 89.5 80.0 - 100.0 fL   MCH 29.5 26.0 - 34.0 pg   MCHC 33.0 32.0 - 36.0 g/dL   RDW 15.0 (H) 11.5 - 14.5 %   Platelets 155 150 - 440 K/uL   Neutrophils Relative % 76 %   Neutro Abs 3.3 1.4 - 6.5 K/uL   Lymphocytes Relative 6 %   Lymphs Abs 0.3 (L) 1.0 - 3.6 K/uL   Monocytes Relative 14 %   Monocytes Absolute 0.6 0.2 - 1.0 K/uL   Eosinophils Relative 4 %   Eosinophils Absolute 0.2 0 - 0.7 K/uL   Basophils Relative 0 %   Basophils Absolute 0.0 0 - 0.1 K/uL  Urinalysis, Complete w Microscopic     Status: Abnormal   Collection Time: 11/20/16  3:26 PM  Result Value Ref Range   Color, Urine YELLOW (A) YELLOW   APPearance CLOUDY (A) CLEAR   Specific Gravity, Urine 1.013 1.005 - 1.030   pH 5.0 5.0 - 8.0   Glucose, UA NEGATIVE NEGATIVE mg/dL   Hgb urine dipstick SMALL (A) NEGATIVE   Bilirubin Urine NEGATIVE NEGATIVE   Ketones, ur NEGATIVE NEGATIVE mg/dL   Protein, ur 30 (A) NEGATIVE mg/dL   Nitrite NEGATIVE NEGATIVE   Leukocytes, UA SMALL (A) NEGATIVE   RBC / HPF 0-5 0 - 5 RBC/hpf   WBC, UA 0-5 0 - 5 WBC/hpf   Bacteria, UA RARE (A) NONE SEEN   Squamous Epithelial / LPF NONE SEEN  NONE SEEN   Uric Acid Crys, UA PRESENT   Blood culture (routine x 2)     Status: None (Preliminary result)   Collection Time: 11/20/16  3:27 PM  Result Value Ref Range   Specimen Description BLOOD LEFT ARM    Special Requests      BOTTLES DRAWN AEROBIC AND ANAEROBIC AER [place] ANA 5CC   Culture NO GROWTH < 24 HOURS    Report Status PENDING   Blood gas, venous     Status: Abnormal (Preliminary result)   Collection Time: 11/20/16  3:35 PM  Result Value Ref Range   pH, Ven 7.26 7.250 - 7.430   pCO2, Ven 46 44.0 - 60.0 mmHg   pO2, Ven <31.0 (LL) 32.0 - 45.0 mmHg   Bicarbonate 20.6 20.0 - 28.0 mmol/L   Acid-base deficit 6.1 (H) 0.0 - 2.0 mmol/L   O2 Saturation PENDING %   Patient temperature 37.0    Collection site VEIN    Sample type VEIN   Blood culture (routine x 2)     Status: None (Preliminary result)   Collection Time: 11/20/16  6:15 PM  Result Value Ref Range   Specimen Description BLOOD  RIGHT AC    Special Requests      BOTTLES DRAWN AEROBIC AND ANAEROBIC  AER 7 ML ANA 7 ML   Culture NO GROWTH < 12 HOURS    Report Status PENDING   Lactic acid, plasma     Status: None   Collection Time: 11/20/16 10:03 PM  Result Value Ref Range   Lactic Acid, Venous 0.8 0.5 - 1.9 mmol/L  Comprehensive metabolic panel     Status: Abnormal   Collection Time: 11/21/16  6:13 AM  Result Value Ref Range   Sodium 138 135 - 145 mmol/L   Potassium 4.3 3.5 - 5.1 mmol/L   Chloride 112 (H) 101 - 111 mmol/L   CO2 20 (L) 22 - 32 mmol/L  Glucose, Bld 97 65 - 99 mg/dL   BUN 39 (H) 6 - 20 mg/dL   Creatinine, Ser 2.23 (H) 0.61 - 1.24 mg/dL   Calcium 7.8 (L) 8.9 - 10.3 mg/dL   Total Protein 5.1 (L) 6.5 - 8.1 g/dL   Albumin 2.2 (L) 3.5 - 5.0 g/dL   AST 12 (L) 15 - 41 U/L   ALT 9 (L) 17 - 63 U/L   Alkaline Phosphatase 204 (H) 38 - 126 U/L   Total Bilirubin 0.5 0.3 - 1.2 mg/dL   GFR calc non Af Amer 26 (L) >60 mL/min   GFR calc Af Amer 31 (L) >60 mL/min   Anion gap 6 5 - 15  CBC     Status: Abnormal     Collection Time: 11/21/16  6:13 AM  Result Value Ref Range   WBC 3.4 (L) 3.8 - 10.6 K/uL   RBC 2.43 (L) 4.40 - 5.90 MIL/uL   Hemoglobin 7.3 (L) 13.0 - 18.0 g/dL   HCT 21.7 (L) 40.0 - 52.0 %   MCV 89.4 80.0 - 100.0 fL   MCH 30.0 26.0 - 34.0 pg   MCHC 33.6 32.0 - 36.0 g/dL   RDW 15.1 (H) 11.5 - 14.5 %   Platelets 134 (L) 150 - 440 K/uL   ____________________________________________  EKG My review and personal interpretation at Time: 18:18   Indication: hypotension  Rate: 90  Rhythm: sinuis Axis: normal Other: non specific st changes, no STEMI ____________________________________________  RADIOLOGY  I personally reviewed all radiographic images ordered to evaluate for the above acute complaints and reviewed radiology reports and findings.  These findings were personally discussed with the patient.  Please see medical record for radiology report. _______________________________________   PROCEDURES  Procedure(s) performed:  Procedures    Critical Care performed: no ____________________________________________   INITIAL IMPRESSION / ASSESSMENT AND PLAN / ED COURSE  Pertinent labs & imaging results that were available during my care of the patient were reviewed by me and considered in my medical decision making (see chart for details).  DDX: Dehydration, sepsis, pna, uti, hypoglycemia, cva, drug effect, withdrawal, encephalitis   Johnathan Gould is a 82 y.o. who presents to the ED with complaint of acute encephalopathy and low blood pressure in the setting of complex past medical history with metastatic prostate cancer. Patient is afebrile but hypotensive and dehydrated appearing. His neuro exam is nonfocal but the patient is confused. Had blood work yesterday him IV fluids with improvement in blood pressure. CT head ordered to evaluate for any evidence of metastatic disease to the brain. We'll check blood work to evaluate for any underlying O abnormalities. We'll provide  IV fluids. Patient on multiple sedating medications for chronic pain which could be leading to his encephalopathy. Patient nontoxic appearing but is frail.  The patient will be placed on continuous pulse oximetry and telemetry for monitoring.  Laboratory evaluation will be sent to evaluate for the above complaints.     Clinical Course as of Nov 21 745  Sat Nov 20, 2016  1637 Called to bedside patient family. Patient asking about why we have not performed a biopsy to give him his diagnosis. Informed patient that we are ordering tests to evaluate for his change in mental status that he was having earlier. Patient's son states that his now acting at baseline.  Ct imaging results reviewed with patient and family. Blood work is at baseline.  Repeat abdominal exam soft and benign. Blood pressure is improving with IV  fluids.  [PR]  1741 UA rare bacteria.  Will given keflex.  Patients blood down trending.  Will repeat IVF bolus.  Based on frailty, AMS and dehydration with concern for symptomatic uti, do feel patient would benefit from admission to hospital for further evaluation and management.  Have discussed with the patient and available family all diagnostics and treatments performed thus far and all questions were answered to the best of my ability. The patient demonstrates understanding and agreement with plan.   [PR]    Clinical Course User Index [PR] Merlyn Lot, MD     ____________________________________________   FINAL CLINICAL IMPRESSION(S) / ED DIAGNOSES  Final diagnoses:  Acute encephalopathy  Hypotension, unspecified hypotension type      NEW MEDICATIONS STARTED DURING THIS VISIT:  Current Discharge Medication List       Note:  This document was prepared using Dragon voice recognition software and may include unintentional dictation errors.    Merlyn Lot, MD 11/21/16 [phone]

## 2016-11-20 NOTE — ED Notes (Signed)
Pt in xr and going to ct afterwards

## 2016-11-21 DIAGNOSIS — Z936 Other artificial openings of urinary tract status: Secondary | ICD-10-CM | POA: Diagnosis not present

## 2016-11-21 DIAGNOSIS — G9341 Metabolic encephalopathy: Secondary | ICD-10-CM | POA: Diagnosis present

## 2016-11-21 DIAGNOSIS — Z79899 Other long term (current) drug therapy: Secondary | ICD-10-CM | POA: Diagnosis not present

## 2016-11-21 DIAGNOSIS — D649 Anemia, unspecified: Secondary | ICD-10-CM | POA: Diagnosis not present

## 2016-11-21 DIAGNOSIS — G893 Neoplasm related pain (acute) (chronic): Secondary | ICD-10-CM | POA: Diagnosis present

## 2016-11-21 DIAGNOSIS — R109 Unspecified abdominal pain: Secondary | ICD-10-CM

## 2016-11-21 DIAGNOSIS — Z955 Presence of coronary angioplasty implant and graft: Secondary | ICD-10-CM | POA: Diagnosis not present

## 2016-11-21 DIAGNOSIS — Z66 Do not resuscitate: Secondary | ICD-10-CM

## 2016-11-21 DIAGNOSIS — N390 Urinary tract infection, site not specified: Secondary | ICD-10-CM | POA: Diagnosis present

## 2016-11-21 DIAGNOSIS — Z8249 Family history of ischemic heart disease and other diseases of the circulatory system: Secondary | ICD-10-CM | POA: Diagnosis not present

## 2016-11-21 DIAGNOSIS — J45909 Unspecified asthma, uncomplicated: Secondary | ICD-10-CM

## 2016-11-21 DIAGNOSIS — B965 Pseudomonas (aeruginosa) (mallei) (pseudomallei) as the cause of diseases classified elsewhere: Secondary | ICD-10-CM | POA: Diagnosis present

## 2016-11-21 DIAGNOSIS — I959 Hypotension, unspecified: Secondary | ICD-10-CM | POA: Diagnosis not present

## 2016-11-21 DIAGNOSIS — R9721 Rising PSA following treatment for malignant neoplasm of prostate: Secondary | ICD-10-CM

## 2016-11-21 DIAGNOSIS — Z87891 Personal history of nicotine dependence: Secondary | ICD-10-CM | POA: Diagnosis not present

## 2016-11-21 DIAGNOSIS — N138 Other obstructive and reflux uropathy: Secondary | ICD-10-CM

## 2016-11-21 DIAGNOSIS — M549 Dorsalgia, unspecified: Secondary | ICD-10-CM

## 2016-11-21 DIAGNOSIS — Z9889 Other specified postprocedural states: Secondary | ICD-10-CM | POA: Diagnosis not present

## 2016-11-21 DIAGNOSIS — N179 Acute kidney failure, unspecified: Secondary | ICD-10-CM | POA: Diagnosis not present

## 2016-11-21 DIAGNOSIS — I10 Essential (primary) hypertension: Secondary | ICD-10-CM

## 2016-11-21 DIAGNOSIS — N1330 Unspecified hydronephrosis: Secondary | ICD-10-CM

## 2016-11-21 DIAGNOSIS — Z801 Family history of malignant neoplasm of trachea, bronchus and lung: Secondary | ICD-10-CM

## 2016-11-21 DIAGNOSIS — C7951 Secondary malignant neoplasm of bone: Secondary | ICD-10-CM | POA: Diagnosis not present

## 2016-11-21 DIAGNOSIS — N329 Bladder disorder, unspecified: Secondary | ICD-10-CM

## 2016-11-21 DIAGNOSIS — I2510 Atherosclerotic heart disease of native coronary artery without angina pectoris: Secondary | ICD-10-CM | POA: Diagnosis present

## 2016-11-21 DIAGNOSIS — R4182 Altered mental status, unspecified: Secondary | ICD-10-CM | POA: Diagnosis not present

## 2016-11-21 DIAGNOSIS — G9340 Encephalopathy, unspecified: Secondary | ICD-10-CM | POA: Diagnosis not present

## 2016-11-21 DIAGNOSIS — I129 Hypertensive chronic kidney disease with stage 1 through stage 4 chronic kidney disease, or unspecified chronic kidney disease: Secondary | ICD-10-CM | POA: Diagnosis present

## 2016-11-21 DIAGNOSIS — N181 Chronic kidney disease, stage 1: Secondary | ICD-10-CM | POA: Diagnosis present

## 2016-11-21 DIAGNOSIS — R599 Enlarged lymph nodes, unspecified: Secondary | ICD-10-CM

## 2016-11-21 DIAGNOSIS — R1110 Vomiting, unspecified: Secondary | ICD-10-CM

## 2016-11-21 DIAGNOSIS — D509 Iron deficiency anemia, unspecified: Secondary | ICD-10-CM | POA: Diagnosis present

## 2016-11-21 DIAGNOSIS — T402X5A Adverse effect of other opioids, initial encounter: Secondary | ICD-10-CM | POA: Diagnosis present

## 2016-11-21 DIAGNOSIS — C61 Malignant neoplasm of prostate: Secondary | ICD-10-CM

## 2016-11-21 DIAGNOSIS — G6289 Other specified polyneuropathies: Secondary | ICD-10-CM | POA: Diagnosis present

## 2016-11-21 DIAGNOSIS — K5903 Drug induced constipation: Secondary | ICD-10-CM | POA: Diagnosis present

## 2016-11-21 DIAGNOSIS — Z923 Personal history of irradiation: Secondary | ICD-10-CM | POA: Diagnosis not present

## 2016-11-21 DIAGNOSIS — E860 Dehydration: Secondary | ICD-10-CM | POA: Diagnosis present

## 2016-11-21 LAB — BLOOD CULTURE ID PANEL (REFLEXED)
ACINETOBACTER BAUMANNII: NOT DETECTED
CANDIDA ALBICANS: NOT DETECTED
CANDIDA TROPICALIS: NOT DETECTED
Candida glabrata: NOT DETECTED
Candida krusei: NOT DETECTED
Candida parapsilosis: NOT DETECTED
ENTEROBACTERIACEAE SPECIES: NOT DETECTED
ENTEROCOCCUS SPECIES: NOT DETECTED
Enterobacter cloacae complex: NOT DETECTED
Escherichia coli: NOT DETECTED
Haemophilus influenzae: NOT DETECTED
KLEBSIELLA PNEUMONIAE: NOT DETECTED
Klebsiella oxytoca: NOT DETECTED
LISTERIA MONOCYTOGENES: NOT DETECTED
Methicillin resistance: NOT DETECTED
Neisseria meningitidis: NOT DETECTED
PSEUDOMONAS AERUGINOSA: NOT DETECTED
Proteus species: NOT DETECTED
STREPTOCOCCUS PYOGENES: NOT DETECTED
Serratia marcescens: NOT DETECTED
Staphylococcus aureus (BCID): NOT DETECTED
Staphylococcus species: DETECTED — AB
Streptococcus agalactiae: NOT DETECTED
Streptococcus pneumoniae: NOT DETECTED
Streptococcus species: NOT DETECTED

## 2016-11-21 LAB — CBC
HEMATOCRIT: 21.7 % — AB (ref 40.0–52.0)
HEMOGLOBIN: 7.3 g/dL — AB (ref 13.0–18.0)
MCH: 30 pg (ref 26.0–34.0)
MCHC: 33.6 g/dL (ref 32.0–36.0)
MCV: 89.4 fL (ref 80.0–100.0)
Platelets: 134 10*3/uL — ABNORMAL LOW (ref 150–440)
RBC: 2.43 MIL/uL — ABNORMAL LOW (ref 4.40–5.90)
RDW: 15.1 % — ABNORMAL HIGH (ref 11.5–14.5)
WBC: 3.4 10*3/uL — ABNORMAL LOW (ref 3.8–10.6)

## 2016-11-21 LAB — COMPREHENSIVE METABOLIC PANEL
ALBUMIN: 2.2 g/dL — AB (ref 3.5–5.0)
ALK PHOS: 204 U/L — AB (ref 38–126)
ALT: 9 U/L — ABNORMAL LOW (ref 17–63)
ANION GAP: 6 (ref 5–15)
AST: 12 U/L — ABNORMAL LOW (ref 15–41)
BUN: 39 mg/dL — ABNORMAL HIGH (ref 6–20)
CO2: 20 mmol/L — AB (ref 22–32)
Calcium: 7.8 mg/dL — ABNORMAL LOW (ref 8.9–10.3)
Chloride: 112 mmol/L — ABNORMAL HIGH (ref 101–111)
Creatinine, Ser: 2.23 mg/dL — ABNORMAL HIGH (ref 0.61–1.24)
GFR calc Af Amer: 31 mL/min — ABNORMAL LOW (ref >60)
GFR calc non Af Amer: 26 mL/min — ABNORMAL LOW (ref >60)
GLUCOSE: 97 mg/dL (ref 65–99)
POTASSIUM: 4.3 mmol/L (ref 3.5–5.1)
SODIUM: 138 mmol/L (ref 135–145)
Total Bilirubin: 0.5 mg/dL (ref 0.3–1.2)
Total Protein: 5.1 g/dL — ABNORMAL LOW (ref 6.5–8.1)

## 2016-11-21 MED ORDER — FAMOTIDINE 20 MG PO TABS
20.0000 mg | ORAL_TABLET | Freq: Every day | ORAL | Status: DC
  Administered 2016-11-21 – 2016-11-22 (×2): 20 mg via ORAL
  Filled 2016-11-21 (×4): qty 1

## 2016-11-21 MED ORDER — DEGARELIX ACETATE 80 MG ~~LOC~~ SOLR
80.0000 mg | Freq: Once | SUBCUTANEOUS | Status: AC
  Administered 2016-11-22: 12:00:00 80 mg via SUBCUTANEOUS
  Filled 2016-11-21: qty 4

## 2016-11-21 NOTE — Progress Notes (Signed)
Assessment and Plan: 1. Metastatic prostate cancer with bilateral ureteral obstruction managed by bilateral nephrostomy tubes.  The family is concerned because of leakage at the connection of the tubes and drainage tubing.  His tubes were placed on 10/17/16 and I will order that they be replaced.   He was given firmagon 240mg  on 10/22/16 based on the imaging and PSA elevation but is to have biopsy on 1/30 for a better tissue diagnosis pending probable initiation of abiraterone.  He is due for another dose of Firmagon 80mg  sq and I will order that.  2. Obstructive uropathy with renal insufficiency improving post tube placement.  3.  Fever and hypotension with possible UTI but the UA was unremarkable.  Cultures pending and antibiotics started 4. No voided urine output but with abdominal pain and fever, I will order a bladder scan.   Subjective: I was asked to see Johnathan Gould in consultation by Dr. Fulton Reek.  He is a 82 yo male who was diagnosed with metastatic prostate cancer with bilateral ureteral obstruction and ARF in mid December.   He had placement of bilateral nephrostomy tubes and his Cr is improving but remains elevated.   He was started on firmagon on 10/22/16 based on his PSA of 469 and the imaging findings of bone mets and a mass at the base of the bladder consistent with locally advanced prostate cancer.   He was admitted yesterday for diffuse pain with nausea, weakness and low grade fever along with hypotension.  His UA was unremarkable.   His family is concerned that the nephrostomy drainage tubes are not staying connected well and there is leakage.  He is pleasant and voices some abdominal discomfort but no other complaints.   He has no voided urine with the bilateral tubes.    ROS:  Review of Systems  Gastrointestinal: Positive for abdominal pain and nausea.  All other systems reviewed and are negative.   Anti-infectives: Anti-infectives    Start     Dose/Rate Route Frequency  Ordered Stop   11/21/16 0800  cefTRIAXone (ROCEPHIN) 1 g in dextrose 5 % 50 mL IVPB  Status:  Discontinued     1 g 100 mL/hr over 30 Minutes Intravenous Every 24 hours 11/20/16 1814 11/20/16 1818   11/21/16 0800  cefTRIAXone (ROCEPHIN) IVPB 1 g     1 g 100 mL/hr over 30 Minutes Intravenous Every 24 hours 11/20/16 1818     11/20/16 1800  cefTRIAXone (ROCEPHIN) 1 g in dextrose 5 % 50 mL IVPB  Status:  Discontinued     1 g 100 mL/hr over 30 Minutes Intravenous  Once 11/20/16 1746 11/20/16 1747   11/20/16 1800  cefTRIAXone (ROCEPHIN) IVPB 1 g     1 g 100 mL/hr over 30 Minutes Intravenous  Once 11/20/16 1747 11/20/16 1854      Current Facility-Administered Medications  Medication Dose Route Frequency Provider Last Rate Last Dose  . 0.9 %  sodium chloride infusion   Intravenous Continuous Idelle Crouch, MD 125 mL/hr at 11/21/16 [phone]    . acetaminophen (TYLENOL) tablet 650 mg  650 mg Oral Q6H PRN Idelle Crouch, MD       Or  . acetaminophen (TYLENOL) suppository 650 mg  650 mg Rectal Q6H PRN Idelle Crouch, MD      . bisacodyl (DULCOLAX) suppository 10 mg  10 mg Rectal Daily PRN Idelle Crouch, MD      . cefTRIAXone (ROCEPHIN) IVPB 1 g  1 g Intravenous  Q24H Merlyn Lot, MD   1 g at 11/21/16 0818  . dexamethasone (DECADRON) tablet 4 mg  4 mg Oral BID WC Idelle Crouch, MD   4 mg at 11/21/16 0818  . famotidine (PEPCID) IVPB 20 mg premix  20 mg Intravenous Q12H Idelle Crouch, MD   20 mg at 11/21/16 0108  . fentaNYL (DURAGESIC - dosed mcg/hr) patch 25 mcg  25 mcg Transdermal Q72H Idelle Crouch, MD      . morphine 2 MG/ML injection 1 mg  1 mg Intravenous Q4H PRN Alexis Hugelmeyer, DO   1 mg at 11/21/16 0831  . nitroGLYCERIN (NITROSTAT) SL tablet 0.4 mg  0.4 mg Sublingual Q5 Min x 3 PRN Idelle Crouch, MD      . ondansetron [facility]) tablet 4 mg  4 mg Oral Q6H PRN Idelle Crouch, MD       Or  . ondansetron [facility]) injection 4 mg  4 mg Intravenous Q6H PRN Idelle Crouch,  MD      . ondansetron [facility]) tablet 4 mg  4 mg Oral Q6H PRN Idelle Crouch, MD      . oxyCODONE (Oxy IR/ROXICODONE) immediate release tablet 10 mg  10 mg Oral Q4H PRN Idelle Crouch, MD   10 mg at 11/20/16 2233  . polyethylene glycol (MIRALAX / GLYCOLAX) packet 17 g  17 g Oral BID Idelle Crouch, MD   17 g at 11/20/16 2212  . senna-docusate (Senokot-S) tablet 2 tablet  2 tablet Oral BID Idelle Crouch, MD   2 tablet at 11/20/16 2212     Objective: Vital signs in last 24 hours: Temp:  [98.7 F (37.1 C)-99.8 F (37.7 C)] 98.7 F (37.1 C) (01/21 0444) Pulse Rate:  [80-104] 85 (01/21 0444) Resp:  [18-21] 18 (01/20 2030) BP: (75-137)/(46-100) 75/56 (01/21 0444) SpO2:  [95 %-100 %] 95 % (01/21 0444) Weight:  [77.9 kg (171 lb 11.2 oz)-79.4 kg (175 lb)] 77.9 kg (171 lb 11.2 oz) (01/21 0534)  Intake/Output from previous day: 01/20 0701 - 01/21 0700 In: 1050 [I.V.:1000; IV Piggyback:50] Out: 1000 [Urine:1000] Intake/Output this shift: No intake/output data recorded.   Physical Exam  Constitutional: He is well-developed, well-nourished, and in no distress.  Cardiovascular: Normal rate and regular rhythm.   Pulmonary/Chest: Effort normal. No respiratory distress.  Abdominal: Soft. He exhibits no mass. There is tenderness (diffusely). There is guarding. There is no rebound.  NT's are draining clear urine bilaterally.  Musculoskeletal: Normal range of motion. He exhibits no edema or tenderness.  Neurological:  He is alert but confused and thinks I had seen him before which I have not.   Skin: Skin is warm and dry.  Psychiatric: Mood and affect normal.  Vitals reviewed.   Lab Results:   Recent Labs  11/20/16 1526 11/21/16 0613  WBC 4.5 3.4*  HGB 7.9* 7.3*  HCT 23.9* 21.7*  PLT 155 134*   BMET  Recent Labs  11/20/16 1526 11/21/16 0613  NA 132* 138  K 4.7 4.3  CL 105 112*  CO2 20* 20*  GLUCOSE 119* 97  BUN 57* 39*  CREATININE 2.91* 2.23*  CALCIUM 7.9* 7.8*    PT/INR No results for input(s): LABPROT, INR in the last 72 hours. ABG  Recent Labs  11/20/16 1535  HCO3 20.6    Studies/Results: Dg Chest 2 View  Result Date: 11/20/2016 CLINICAL DATA:  Mental status changes and hallucinations. Hypotension. History of metastatic prostate carcinoma. EXAM: CHEST  2  VIEW COMPARISON:  11/06/2012 FINDINGS: Stable ectasia and prominence of the thoracic aorta. No edema, airspace consolidation, nodule or pleural fluid identified. Bilateral nephrostomy tubes visible. Multiple sclerotic lesions are seen involving ribs and vertebral bodies consistent with metastatic prostate carcinoma. IMPRESSION: No acute pulmonary process identified. Skeletal metastatic disease is present consistent with known metastatic prostate carcinoma. Electronically Signed   By: [name] M.D.   On: 11/20/2016 15:07   Dg Abdomen 1 View  Result Date: 11/20/2016 CLINICAL DATA:  Bilateral nephrostomy EXAM: ABDOMEN - 1 VIEW COMPARISON:  CT scan 10/16/2016 FINDINGS: There is normal small bowel gas pattern. Some colonic stool noted in right colon and descending colon. Bilateral nephrostomy catheters are partially visualized. Mild dextroscoliosis and degenerative changes lumbar spine. IMPRESSION: Bilateral nephrostomy catheters are partially visualized. Normal small bowel gas pattern. Electronically Signed   By: Lahoma Crocker M.D.   On: 11/20/2016 17:09   Ct Head Wo Contrast  Result Date: 11/20/2016 CLINICAL DATA:  Altered mental status, hallucinations EXAM: CT HEAD WITHOUT CONTRAST TECHNIQUE: Contiguous axial images were obtained from the base of the skull through the vertex without intravenous contrast. COMPARISON:  None. FINDINGS: Brain: No intracranial hemorrhage, mass effect or midline shift. No acute cortical infarction. Mild cerebral atrophy. Mild periventricular chronic white matter disease. No mass lesion is noted on this unenhanced scan. Vascular: Atherosclerotic calcifications of  carotid siphon. Skull: No skull fracture is noted. Sinuses/Orbits: No acute findings Other: None IMPRESSION: No acute intracranial abnormality. Mild cerebral atrophy. No acute cortical infarction. Electronically Signed   By: Lahoma Crocker M.D.   On: 11/20/2016 15:09   I have reviewed his labs, prior hospital notes and imaging.    CC: Dr. Fulton Reek.       LOS: 0 days    Malka So 11/21/2016 [id number] ID: Johnathan Gould, male   DOB: 1935-02-21, 82 y.o.   MRN: [id number]

## 2016-11-21 NOTE — Progress Notes (Signed)
PHARMACIST - PHYSICIAN COMMUNICATION   CONCERNING: IV to Oral Route Change Policy and Adjustment for Renal Function  RECOMMENDATION: This patient is receiving FAMOTIDINE by the intravenous route.  Based on criteria approved by the Pharmacy and Therapeutics Committee, the intravenous medication(s) is/are being converted to the equivalent oral dose form(s).  -Famotidine adjusted from to 20mg  Q24h for Crcl 26 ml/min   DESCRIPTION: These criteria include:  The patient is eating (either orally or via tube) and/or has been taking other orally administered medications for a least 24 hours  The patient has no evidence of active gastrointestinal bleeding or impaired GI absorption (gastrectomy, short bowel, patient on TNA or NPO).  If you have questions about this conversion, please contact the Pharmacy Department  []   [phone] )  [facility] [x]   [phone] )  [facility] []   [phone] )  [facility] []   [phone] )  [facility] []   [phone] )  [place], [facility] 11/21/2016 4:33 PM

## 2016-11-21 NOTE — Progress Notes (Signed)
[place] [facility] [place] NAME: Johnathan Gould    MR#:  [id number]  DATE OF BIRTH:  31-Jan-1935  SUBJECTIVE: seen  At bedside, admitted because of nausea, vomiting, generalized weakness. Hypotension. C/o pain at nephrostomy site.  CHIEF COMPLAINT:   Chief Complaint  Patient presents with  . Altered Mental Status    REVIEW OF SYSTEMS:   ROS CONSTITUTIONAL: No fever, fatigue or weakness.  EYES: No blurred or double vision.  EARS, NOSE, AND THROAT: No tinnitus or ear pain.  RESPIRATORY: No cough, shortness of breath, wheezing or hemoptysis.  CARDIOVASCULAR: No chest pain, orthopnea, edema.  GASTROINTESTINAL: No nausea, vomiting, diarrhea or abdominal pain.  GENITOURINARY: No dysuria, hematuria.  ENDOCRINE: No polyuria, nocturia,  HEMATOLOGY: No anemia, easy bruising or bleeding SKIN: No rash or lesion. MUSCULOSKELETAL: No joint pain or arthritis.   NEUROLOGIC: No tingling, numbness, weakness.  PSYCHIATRY: No anxiety or depression.   DRUG ALLERGIES:  No Known Allergies  VITALS:  Blood pressure 95/61, pulse 98, temperature 98 F (36.7 C), temperature source Oral, resp. rate 18, height 5\' 8"  (1.727 m), weight 77.9 kg (171 lb 11.2 oz), SpO2 98 %.  PHYSICAL EXAMINATION:  GENERAL:  82 y.o.-year-old patient lying in the bed with no acute distress.  EYES: Pupils equal, round, reactive to light and accommodation. No scleral icterus. Extraocular muscles intact.  HEENT: Head atraumatic, normocephalic. Oropharynx and nasopharynx clear.  NECK:  Supple, no jugular venous distention. No thyroid enlargement, no tenderness.  LUNGS: Normal breath sounds bilaterally, no wheezing, rales,rhonchi or crepitation. No use of accessory muscles of respiration.  CARDIOVASCULAR: S1, S2 normal. No murmurs, rubs, or gallops.  ABDOMEN: Soft, nontender, nondistended. Bowel sounds present. No organomegaly or mass. Bilateral nephrostomy tubes. EXTREMITIES: No pedal  edema, cyanosis, or clubbing.  NEUROLOGIC: Cranial nerves II through XII are intact. Muscle strength 5/5 in all extremities. Sensation intact. Gait not checked.  PSYCHIATRIC: The patient is alert and oriented x 3.  SKIN: No obvious rash, lesion, or ulcer.    LABORATORY PANEL:   CBC  Recent Labs Lab 11/21/16 0613  WBC 3.4*  HGB 7.3*  HCT 21.7*  PLT 134*   ------------------------------------------------------------------------------------------------------------------  Chemistries   Recent Labs Lab 11/21/16 0613  NA 138  K 4.3  CL 112*  CO2 20*  GLUCOSE 97  BUN 39*  CREATININE 2.23*  CALCIUM 7.8*  AST 12*  ALT 9*  ALKPHOS 204*  BILITOT 0.5   ------------------------------------------------------------------------------------------------------------------  Cardiac Enzymes No results for input(s): TROPONINI in the last 168 hours. ------------------------------------------------------------------------------------------------------------------  RADIOLOGY:  Dg Chest 2 View  Result Date: 11/20/2016 CLINICAL DATA:  Mental status changes and hallucinations. Hypotension. History of metastatic prostate carcinoma. EXAM: CHEST  2 VIEW COMPARISON:  11/06/2012 FINDINGS: Stable ectasia and prominence of the thoracic aorta. No edema, airspace consolidation, nodule or pleural fluid identified. Bilateral nephrostomy tubes visible. Multiple sclerotic lesions are seen involving ribs and vertebral bodies consistent with metastatic prostate carcinoma. IMPRESSION: No acute pulmonary process identified. Skeletal metastatic disease is present consistent with known metastatic prostate carcinoma. Electronically Signed   By: [name] M.D.   On: 11/20/2016 15:07   Dg Abdomen 1 View  Result Date: 11/20/2016 CLINICAL DATA:  Bilateral nephrostomy EXAM: ABDOMEN - 1 VIEW COMPARISON:  CT scan 10/16/2016 FINDINGS: There is normal small bowel gas pattern. Some colonic stool noted in right colon  and descending colon. Bilateral nephrostomy catheters are partially visualized. Mild dextroscoliosis and degenerative changes lumbar spine. IMPRESSION: Bilateral nephrostomy  catheters are partially visualized. Normal small bowel gas pattern. Electronically Signed   By: Lahoma Crocker M.D.   On: 11/20/2016 17:09   Ct Head Wo Contrast  Result Date: 11/20/2016 CLINICAL DATA:  Altered mental status, hallucinations EXAM: CT HEAD WITHOUT CONTRAST TECHNIQUE: Contiguous axial images were obtained from the base of the skull through the vertex without intravenous contrast. COMPARISON:  None. FINDINGS: Brain: No intracranial hemorrhage, mass effect or midline shift. No acute cortical infarction. Mild cerebral atrophy. Mild periventricular chronic white matter disease. No mass lesion is noted on this unenhanced scan. Vascular: Atherosclerotic calcifications of carotid siphon. Skull: No skull fracture is noted. Sinuses/Orbits: No acute findings Other: None IMPRESSION: No acute intracranial abnormality. Mild cerebral atrophy. No acute cortical infarction. Electronically Signed   By: Lahoma Crocker M.D.   On: 11/20/2016 15:09    EKG:   Orders placed or performed during the hospital encounter of 11/20/16  . ED EKG  . ED EKG    ASSESSMENT AND PLAN:   Nausea and vomiting secondary to constipation: Didn't have a bowel movement in long time, had a BM yesterday. No nausea or vomiting today. Changes with her diet to regular diet.  #2 chronic pain due to advanced prostate cancer: on multiple pain medicines. Which can cause constipation. Continue stool softeners, continue pain medicines. Code status is DO NOT RESUSCITATE, discussed with family.  #3 metastatic prostate cancer with bilateral nephrostomy tubes because of bilateral ureteral obstruction: Seen by urology, tubes are placed in December 2017. Getting chemotherapy.  #4 hypotension: Episode of fever at home. Evaluate for infection, follow blood cultures, urine cultures,  started on empiric Rocephin, continu until cultures were negative. #5 acute on chronic renal failure that improved with IV fluids. Continue IV fluids because patient is still hypotensive.        All the records are reviewed and case discussed with Care Management/Social Workerr. Management plans discussed with the patient, family and they are in agreement.  CODE STATUS: DNR  TOTAL TIME TAKING CARE OF THIS PATIENT: 28minutes.   POSSIBLE D/C IN 1-2 DAYS, DEPENDING ON CLINICAL CONDITION.   Epifanio Lesches M.D on 11/21/2016 at 10:53 AM  Between 7am to 6pm - Pager - [phone]  After 6pm go to [email or web address] - password [email or web address] [facility]  [place] [facility]  Office  [phone]  CC: Primary care physician; No PCP Per Patient   Note: This dictation was prepared with Dragon dictation along with smaller phrase technology. Any transcriptional errors that result from this process are unintentional.

## 2016-11-21 NOTE — Plan of Care (Signed)
Problem: Pain Managment: Goal: General experience of comfort will improve Outcome: Not Progressing Pt with complaints of generalized pain this shift. PRN medication given with relief

## 2016-11-21 NOTE — Progress Notes (Signed)
PHARMACY - PHYSICIAN COMMUNICATION CRITICAL VALUE ALERT - BLOOD CULTURE IDENTIFICATION (BCID)  Results for orders placed or performed during the hospital encounter of 11/20/16  Blood Culture ID Panel (Reflexed) (Collected: 11/20/2016  3:27 PM)  Result Value Ref Range   Enterococcus species NOT DETECTED NOT DETECTED   Listeria monocytogenes NOT DETECTED NOT DETECTED   Staphylococcus species DETECTED (A) NOT DETECTED   Staphylococcus aureus NOT DETECTED NOT DETECTED   Methicillin resistance NOT DETECTED NOT DETECTED   Streptococcus species NOT DETECTED NOT DETECTED   Streptococcus agalactiae NOT DETECTED NOT DETECTED   Streptococcus pneumoniae NOT DETECTED NOT DETECTED   Streptococcus pyogenes NOT DETECTED NOT DETECTED   Acinetobacter baumannii NOT DETECTED NOT DETECTED   Enterobacteriaceae species NOT DETECTED NOT DETECTED   Enterobacter cloacae complex NOT DETECTED NOT DETECTED   Escherichia coli NOT DETECTED NOT DETECTED   Klebsiella oxytoca NOT DETECTED NOT DETECTED   Klebsiella pneumoniae NOT DETECTED NOT DETECTED   Proteus species NOT DETECTED NOT DETECTED   Serratia marcescens NOT DETECTED NOT DETECTED   Haemophilus influenzae NOT DETECTED NOT DETECTED   Neisseria meningitidis NOT DETECTED NOT DETECTED   Pseudomonas aeruginosa NOT DETECTED NOT DETECTED   Candida albicans NOT DETECTED NOT DETECTED   Candida glabrata NOT DETECTED NOT DETECTED   Candida krusei NOT DETECTED NOT DETECTED   Candida parapsilosis NOT DETECTED NOT DETECTED   Candida tropicalis NOT DETECTED NOT DETECTED    Name of physician (or Provider) Contacted: Dr. Ara Kussmaul  Changes to prescribed antibiotics required: No change  Laural Benes, Pharm.D., BCPS Clinical Pharmacist 11/21/2016  11:53 PM

## 2016-11-21 NOTE — Consult Note (Signed)
[facility]  Date of admission:  11/20/2016  Inpatient day:  11/21/2016  Consulting physician: Dr. Aram Beecham  Reason for Consultation:  Metastatic prostate cancer  Chief Complaint: Johnathan Gould is a 82 y.o. male with metastatic prostate cancer who was admitted through the emergency room with hypotension and altered mental status.  HPI:  The patient was admitted to [facility] from 10/16/2016 - 10/23/2016 with bone pain and acute renal failure.  Bilateral nephrostomy tubes were placed.  Creatinine improved from 7 to 2.6.     Bone scan revealed diffuse metastasis.  CT scan revealed a polypoid mass at the posterior aspect of the bladder with bilateral severe hydroureteronephrosis.  There was bulky adenopathy throughout the abdomen and pelvis and widespread osseous metastasis.  PSA was 469.  He received degarelix 240 mg SQ on 10/22/2016.  Radiation oncology was consulted.  He was started on a Fentanyl patch.  He received a course of palliative radiation (3000 cGy x 10 fractions) to the left hip, SI joints, and L5 vertebral body.  Radiation completed on 11/16/2016.  He was seen in the outpatient department on 11/19/2016 by Dr. Smith Robert.  He was drinking fluids, but not eating much.  BUN was 59 with a creatinine of 3.02.  Hematocrit was 26.2 with a hemoglobin of 8.7 and MCV 89.1.  Blood pressure was low.  He received 1 liter of fluids.  The patient began to have hallucinations yesterday.  He had no fever.  He was seen in the emergency room.  He was hypotensive (BP 75/56).  He received IV fluids.  Blood and urine was cultured.  CXR revealed no evidence of pneumonia.  Urinalysis did not suggest a UTI.  He was empirically started on antibiotics.  Symptomatically, he feels better today.  He has had no further hallucinations.  He notes back and lower abdominal discomfort.  He has had some soft stools, but no diarrhea.  He denies any melena or hematochezia.  He denies any hematuria.  He had  one episode of emesis yesterday.   Past Medical History:  Diagnosis Date  . CAD (coronary artery disease)   . Cancer (HCC)   . Hypertension   . RAD (reactive airway disease)     Past Surgical History:  Procedure Laterality Date  . CORONARY ANGIOPLASTY WITH STENT PLACEMENT    . IR GENERIC HISTORICAL  10/17/2016   IR NEPHROSTOMY PLACEMENT RIGHT 10/17/2016 [facility]-INTERV RAD  . IR GENERIC HISTORICAL  10/17/2016   IR NEPHROSTOMY PLACEMENT LEFT 10/17/2016 Irish Lack, MD [facility]-INTERV RAD  . KNEE SURGERY Left 2006   Laparascopy done on left knee, scar tissue taken out  . nephrostomy tubes Bilateral     Family History  Problem Relation Age of Onset  . Hypertension Other   . Lung cancer Brother     Social History:  reports that he quit smoking about 2 years ago. He has a 69.50 pack-year smoking history. He has never used smokeless tobacco. He reports that he does not drink alcohol or use drugs.  He is accompanied by his wife and son.  Allergies: No Known Allergies  Medications Prior to Admission  Medication Sig Dispense Refill  . fentaNYL (DURAGESIC - DOSED MCG/HR) 25 MCG/HR patch Place 1 patch (25 mcg total) onto the skin every 3 (three) days. 10 patch 0  . lisinopril (PRINIVIL,ZESTRIL) 5 MG tablet Take 5 mg by mouth daily.    . nitroGLYCERIN (NITROSTAT) 0.4 MG SL tablet Place 0.4 mg under the tongue every  5 (five) minutes x 3 doses as needed for chest pain.    Marland Kitchen ondansetron (ZOFRAN) 4 MG tablet Take 1 tablet (4 mg total) by mouth every 6 (six) hours as needed for nausea. 40 tablet 0  . Oxycodone HCl 10 MG TABS Take 1 tablet (10 mg total) by mouth every 4 (four) hours as needed. 60 tablet 0  . polyethylene glycol (MIRALAX / GLYCOLAX) packet Take 17 g by mouth daily.    Marland Kitchen senna-docusate (SENOKOT-S) 8.6-50 MG tablet Take 2 tablets by mouth 2 (two) times daily. 30 tablet 0  . SPS 15 GM/60ML suspension Take 60 mLs by mouth daily.    Marland Kitchen dexamethasone (DECADRON) 4 MG tablet Take 1 tablet  (4 mg total) by mouth 2 (two) times daily with a meal. 14 tablet 0  . metoprolol (LOPRESSOR) 50 MG tablet Take 50 mg by mouth 2 (two) times daily.      Review of Systems: GENERAL:  Fatigue.  No fevers, sweats or weight loss. PERFORMANCE STATUS (ECOG):  2 HEENT:  No visual changes, runny nose, sore throat, mouth sores or tenderness. Lungs: No shortness of breath or cough.  No hemoptysis. Cardiac:  No chest pain, palpitations, orthopnea, or PND. GI:  Emesis x 1 yesterday.  Lower abdominal discomfort.  Soft stool.  No constipation, melena or hematochezia. GU:  s/p bilateral nephrostomy tubes.  No urgency, frequency, dysuria, or hematuria. Musculoskeletal:  Back pain.  Left hip pain, improved.  No muscle tenderness. Extremities:  No pain or swelling. Skin:  No rashes or skin changes. Neuro:  General weakness.  No headache, numbness or weakness, balance or coordination issues. Endocrine:  No diabetes, thyroid issues, hot flashes or night sweats. Psych:  No mood changes, depression or anxiety. Pain:  Diffuse bone pain. Review of systems:  All other systems reviewed and found to be negative.  Physical Exam:  Blood pressure 95/61, pulse 98, temperature 98 F (36.7 C), temperature source Oral, resp. rate 18, height '5\' 8"'$  (1.727 m), weight 171 lb 11.2 oz (77.9 kg), SpO2 98 %.  GENERAL:  Well developed, well nourished, gentleman sitting comfortably on the medical unit in no acute distress. MENTAL STATUS:  Alert and oriented to person, place and time. HEAD:  Pearline Cables hair with thin mustache.  Normocephalic, atraumatic, face symmetric, no Cushingoid features. EYES:  Hazel eyes.  Pupils equal round and reactive to light and accomodation.  No conjunctivitis or scleral icterus. ENT:  Oropharynx clear without lesion.  Tongue normal. Mucous membranes moist.  RESPIRATORY:  Clear to auscultation without rales, wheezes or rhonchi. CARDIOVASCULAR:  Regular rate and rhythm without murmur, rub or gallop. ABDOMEN:   Soft, slightly tender in lower quadrants without guarding or rebound tenderness.  Active bowel sounds and no hepatosplenomegaly.  No masses. BACK:  Bilateral external nephrostomy tubes.   SKIN:  No rashes, ulcers or lesions. EXTREMITIES: No edema, no skin discoloration or tenderness.  No palpable cords. LYMPH NODES: No palpable cervical, supraclavicular, axillary or inguinal adenopathy  NEUROLOGICAL: Unremarkable. PSYCH:  Appropriate.  Results for orders placed or performed during the hospital encounter of 11/20/16 (from the past 48 hour(s))  Lactic acid, plasma     Status: None   Collection Time: 11/20/16  3:26 PM  Result Value Ref Range   Lactic Acid, Venous 0.9 0.5 - 1.9 mmol/L  Comprehensive metabolic panel     Status: Abnormal   Collection Time: 11/20/16  3:26 PM  Result Value Ref Range   Sodium 132 (L) 135 - 145  mmol/L   Potassium 4.7 3.5 - 5.1 mmol/L   Chloride 105 101 - 111 mmol/L   CO2 20 (L) 22 - 32 mmol/L   Glucose, Bld 119 (H) 65 - 99 mg/dL   BUN 57 (H) 6 - 20 mg/dL   Creatinine, Ser 2.91 (H) 0.61 - 1.24 mg/dL   Calcium 7.9 (L) 8.9 - 10.3 mg/dL   Total Protein 5.9 (L) 6.5 - 8.1 g/dL   Albumin 2.7 (L) 3.5 - 5.0 g/dL   AST 10 (L) 15 - 41 U/L   ALT 10 (L) 17 - 63 U/L   Alkaline Phosphatase 229 (H) 38 - 126 U/L   Total Bilirubin 0.5 0.3 - 1.2 mg/dL   GFR calc non Af Amer 19 (L) >60 mL/min   GFR calc Af Amer 22 (L) >60 mL/min    Comment: (NOTE) The eGFR has been calculated using the CKD EPI equation. This calculation has not been validated in all clinical situations. eGFR's persistently <60 mL/min signify possible Chronic Kidney Disease.    Anion gap 7 5 - 15  CBC with Differential     Status: Abnormal   Collection Time: 11/20/16  3:26 PM  Result Value Ref Range   WBC 4.5 3.8 - 10.6 K/uL   RBC 2.67 (L) 4.40 - 5.90 MIL/uL   Hemoglobin 7.9 (L) 13.0 - 18.0 g/dL   HCT 23.9 (L) 40.0 - 52.0 %   MCV 89.5 80.0 - 100.0 fL   MCH 29.5 26.0 - 34.0 pg   MCHC 33.0 32.0 -  36.0 g/dL   RDW 15.0 (H) 11.5 - 14.5 %   Platelets 155 150 - 440 K/uL   Neutrophils Relative % 76 %   Neutro Abs 3.3 1.4 - 6.5 K/uL   Lymphocytes Relative 6 %   Lymphs Abs 0.3 (L) 1.0 - 3.6 K/uL   Monocytes Relative 14 %   Monocytes Absolute 0.6 0.2 - 1.0 K/uL   Eosinophils Relative 4 %   Eosinophils Absolute 0.2 0 - 0.7 K/uL   Basophils Relative 0 %   Basophils Absolute 0.0 0 - 0.1 K/uL  Urinalysis, Complete w Microscopic     Status: Abnormal   Collection Time: 11/20/16  3:26 PM  Result Value Ref Range   Color, Urine YELLOW (A) YELLOW   APPearance CLOUDY (A) CLEAR   Specific Gravity, Urine 1.013 1.005 - 1.030   pH 5.0 5.0 - 8.0   Glucose, UA NEGATIVE NEGATIVE mg/dL   Hgb urine dipstick SMALL (A) NEGATIVE   Bilirubin Urine NEGATIVE NEGATIVE   Ketones, ur NEGATIVE NEGATIVE mg/dL   Protein, ur 30 (A) NEGATIVE mg/dL   Nitrite NEGATIVE NEGATIVE   Leukocytes, UA SMALL (A) NEGATIVE   RBC / HPF 0-5 0 - 5 RBC/hpf   WBC, UA 0-5 0 - 5 WBC/hpf   Bacteria, UA RARE (A) NONE SEEN   Squamous Epithelial / LPF NONE SEEN NONE SEEN   Uric Acid Crys, UA PRESENT   Blood culture (routine x 2)     Status: None (Preliminary result)   Collection Time: 11/20/16  3:27 PM  Result Value Ref Range   Specimen Description BLOOD LEFT ARM    Special Requests      BOTTLES DRAWN AEROBIC AND ANAEROBIC AER [place] ANA 5CC   Culture NO GROWTH < 24 HOURS    Report Status PENDING   Blood gas, venous     Status: Abnormal (Preliminary result)   Collection Time: 11/20/16  3:35 PM  Result Value Ref Range  pH, Ven 7.26 7.250 - 7.430   pCO2, Ven 46 44.0 - 60.0 mmHg   pO2, Ven <31.0 (LL) 32.0 - 45.0 mmHg    Comment: CRITICAL RESULT CALLED TO, READ BACK BY AND VERIFIED WITH: SUSAN NEAL RN ON [id number] AT 1545    Bicarbonate 20.6 20.0 - 28.0 mmol/L   Acid-base deficit 6.1 (H) 0.0 - 2.0 mmol/L   O2 Saturation PENDING %   Patient temperature 37.0    Collection site VEIN    Sample type VEIN   Blood culture (routine x  2)     Status: None (Preliminary result)   Collection Time: 11/20/16  6:15 PM  Result Value Ref Range   Specimen Description BLOOD  RIGHT AC    Special Requests      BOTTLES DRAWN AEROBIC AND ANAEROBIC  AER 7 ML ANA 7 ML   Culture NO GROWTH < 12 HOURS    Report Status PENDING   Lactic acid, plasma     Status: None   Collection Time: 11/20/16 10:03 PM  Result Value Ref Range   Lactic Acid, Venous 0.8 0.5 - 1.9 mmol/L  Comprehensive metabolic panel     Status: Abnormal   Collection Time: 11/21/16  6:13 AM  Result Value Ref Range   Sodium 138 135 - 145 mmol/L   Potassium 4.3 3.5 - 5.1 mmol/L   Chloride 112 (H) 101 - 111 mmol/L   CO2 20 (L) 22 - 32 mmol/L   Glucose, Bld 97 65 - 99 mg/dL   BUN 39 (H) 6 - 20 mg/dL   Creatinine, Ser 4.54 (H) 0.61 - 1.24 mg/dL   Calcium 7.8 (L) 8.9 - 10.3 mg/dL   Total Protein 5.1 (L) 6.5 - 8.1 g/dL   Albumin 2.2 (L) 3.5 - 5.0 g/dL   AST 12 (L) 15 - 41 U/L   ALT 9 (L) 17 - 63 U/L   Alkaline Phosphatase 204 (H) 38 - 126 U/L   Total Bilirubin 0.5 0.3 - 1.2 mg/dL   GFR calc non Af Amer 26 (L) >60 mL/min   GFR calc Af Amer 31 (L) >60 mL/min    Comment: (NOTE) The eGFR has been calculated using the CKD EPI equation. This calculation has not been validated in all clinical situations. eGFR's persistently <60 mL/min signify possible Chronic Kidney Disease.    Anion gap 6 5 - 15  CBC     Status: Abnormal   Collection Time: 11/21/16  6:13 AM  Result Value Ref Range   WBC 3.4 (L) 3.8 - 10.6 K/uL   RBC 2.43 (L) 4.40 - 5.90 MIL/uL   Hemoglobin 7.3 (L) 13.0 - 18.0 g/dL   HCT 62.0 (L) 12.0 - 37.9 %   MCV 89.4 80.0 - 100.0 fL   MCH 30.0 26.0 - 34.0 pg   MCHC 33.6 32.0 - 36.0 g/dL   RDW 28.6 (H) 28.7 - 04.8 %   Platelets 134 (L) 150 - 440 K/uL   Dg Chest 2 View  Result Date: 11/20/2016 CLINICAL DATA:  Mental status changes and hallucinations. Hypotension. History of metastatic prostate carcinoma. EXAM: CHEST  2 VIEW COMPARISON:  11/06/2012 FINDINGS:  Stable ectasia and prominence of the thoracic aorta. No edema, airspace consolidation, nodule or pleural fluid identified. Bilateral nephrostomy tubes visible. Multiple sclerotic lesions are seen involving ribs and vertebral bodies consistent with metastatic prostate carcinoma. IMPRESSION: No acute pulmonary process identified. Skeletal metastatic disease is present consistent with known metastatic prostate carcinoma. Electronically Signed   By: Sherrine Maples  Kathlene Cote M.D.   On: 11/20/2016 15:07   Dg Abdomen 1 View  Result Date: 11/20/2016 CLINICAL DATA:  Bilateral nephrostomy EXAM: ABDOMEN - 1 VIEW COMPARISON:  CT scan 10/16/2016 FINDINGS: There is normal small bowel gas pattern. Some colonic stool noted in right colon and descending colon. Bilateral nephrostomy catheters are partially visualized. Mild dextroscoliosis and degenerative changes lumbar spine. IMPRESSION: Bilateral nephrostomy catheters are partially visualized. Normal small bowel gas pattern. Electronically Signed   By: Lahoma Crocker M.D.   On: 11/20/2016 17:09   Ct Head Wo Contrast  Result Date: 11/20/2016 CLINICAL DATA:  Altered mental status, hallucinations EXAM: CT HEAD WITHOUT CONTRAST TECHNIQUE: Contiguous axial images were obtained from the base of the skull through the vertex without intravenous contrast. COMPARISON:  None. FINDINGS: Brain: No intracranial hemorrhage, mass effect or midline shift. No acute cortical infarction. Mild cerebral atrophy. Mild periventricular chronic white matter disease. No mass lesion is noted on this unenhanced scan. Vascular: Atherosclerotic calcifications of carotid siphon. Skull: No skull fracture is noted. Sinuses/Orbits: No acute findings Other: None IMPRESSION: No acute intracranial abnormality. Mild cerebral atrophy. No acute cortical infarction. Electronically Signed   By: Lahoma Crocker M.D.   On: 11/20/2016 15:09    Assessment:  The patient is a 82 y.o.  gentleman with metastatic prostate cancer who was  admitted with hypotension and altered mental status.  Clinically, he has improved overnight after fluids and IV antibiotics.  Cultures are negative to date.  Plan:   1.  Oncology:  Metastatic prostate cancer.  PSA 469.  Per patient's son, biopsy planned later this month.  He received a loading dose of degarelix, GNRH antagonist, on 10/22/2016.  Next degarelix (80 mg) due on 11/19/2016.    Patient has received palliative radiation.   Patient not interested in chemotherapy.  Discussed consideration of abiraterone.  Side effects reviewed.  Patient will be pre-authorized in outpatient department.  Await planned biopsy for disease confirmation.  2.  Hematology:  Likely multi-factorial anemia (renal insufficiency, chronic disease, some component of iron deficiency, and possible metastatic disease to bone marrow).  Labs on 11/19/2016 revealed a ferritin of 93, TIBC 227 (low), 12% iron saturation, and folate 7.7.  Hematocrit is 21.7 with a hemoglobin of 7.3.  Add type and screen to AM labs.  Will likely need transfusion.  3.  Urology:  Nephrostomy tube exchange planned for tomorrow.  Creatinine stable.  Fluids continue.  4.  Infectious disease:  Empiric antibiotics (ceftriaxone).  Follow cultures.  5.  Cardiovascular:  Blood pressure improved with fluids along with mentation.  Code status DNR per chart.   Thank you for allowing me to participate in KENTO GOSSMAN 's care.  I will follow him closely with you while hospitalized until Dr. Elroy Channel return on 11/22/2016.   Lequita Asal, MD  11/21/2016, 12:56 PM

## 2016-11-22 ENCOUNTER — Inpatient Hospital Stay: Payer: Medicare Other

## 2016-11-22 DIAGNOSIS — N189 Chronic kidney disease, unspecified: Secondary | ICD-10-CM

## 2016-11-22 DIAGNOSIS — R5383 Other fatigue: Secondary | ICD-10-CM

## 2016-11-22 DIAGNOSIS — R531 Weakness: Secondary | ICD-10-CM

## 2016-11-22 DIAGNOSIS — G9340 Encephalopathy, unspecified: Secondary | ICD-10-CM

## 2016-11-22 DIAGNOSIS — R443 Hallucinations, unspecified: Secondary | ICD-10-CM

## 2016-11-22 DIAGNOSIS — G893 Neoplasm related pain (acute) (chronic): Secondary | ICD-10-CM

## 2016-11-22 DIAGNOSIS — D631 Anemia in chronic kidney disease: Secondary | ICD-10-CM | POA: Insufficient documentation

## 2016-11-22 LAB — BASIC METABOLIC PANEL
Anion gap: 6 (ref 5–15)
BUN: 29 mg/dL — AB (ref 6–20)
CO2: 18 mmol/L — ABNORMAL LOW (ref 22–32)
Calcium: 8 mg/dL — ABNORMAL LOW (ref 8.9–10.3)
Chloride: 110 mmol/L (ref 101–111)
Creatinine, Ser: 1.67 mg/dL — ABNORMAL HIGH (ref 0.61–1.24)
GFR calc Af Amer: 43 mL/min — ABNORMAL LOW (ref >60)
GFR, EST NON AFRICAN AMERICAN: 37 mL/min — AB (ref >60)
GLUCOSE: 161 mg/dL — AB (ref 65–99)
POTASSIUM: 4.6 mmol/L (ref 3.5–5.1)
Sodium: 134 mmol/L — ABNORMAL LOW (ref 135–145)

## 2016-11-22 LAB — CBC WITH DIFFERENTIAL/PLATELET
Basophils Absolute: 0 10*3/uL (ref 0–0.1)
Basophils Relative: 0 %
Eosinophils Absolute: 0 10*3/uL (ref 0–0.7)
Eosinophils Relative: 0 %
HCT: 23.8 % — ABNORMAL LOW (ref 40.0–52.0)
Hemoglobin: 7.9 g/dL — ABNORMAL LOW (ref 13.0–18.0)
Lymphocytes Relative: 4 %
Lymphs Abs: 0.2 10*3/uL — ABNORMAL LOW (ref 1.0–3.6)
MCH: 29.7 pg (ref 26.0–34.0)
MCHC: 33.4 g/dL (ref 32.0–36.0)
MCV: 89 fL (ref 80.0–100.0)
Monocytes Absolute: 0.2 10*3/uL (ref 0.2–1.0)
Monocytes Relative: 6 %
Neutro Abs: 3.5 10*3/uL (ref 1.4–6.5)
Neutrophils Relative %: 90 %
Platelets: 166 10*3/uL (ref 150–440)
RBC: 2.67 MIL/uL — ABNORMAL LOW (ref 4.40–5.90)
RDW: 14.7 % — ABNORMAL HIGH (ref 11.5–14.5)
WBC: 3.9 10*3/uL (ref 3.8–10.6)

## 2016-11-22 LAB — RETICULOCYTES
RBC.: 2.67 MIL/uL — ABNORMAL LOW (ref 4.40–5.90)
Retic Count, Absolute: 42.7 10*3/uL (ref 19.0–183.0)
Retic Ct Pct: 1.6 % (ref 0.4–3.1)

## 2016-11-22 LAB — PSA: PSA: 1.98 ng/mL (ref 0.00–4.00)

## 2016-11-22 LAB — TYPE AND SCREEN
ABO/RH(D): A POS
Antibody Screen: NEGATIVE

## 2016-11-22 MED ORDER — FENTANYL 25 MCG/HR TD PT72
25.0000 ug | MEDICATED_PATCH | TRANSDERMAL | Status: DC
  Administered 2016-11-22: 25 ug via TRANSDERMAL
  Filled 2016-11-22: qty 1
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
## 2016-11-22 NOTE — Progress Notes (Signed)
Hematology/Oncology Consult note [facility]  Telephone:([phone] [phone]  Patient Care Team: No Pcp Per Patient as PCP - General (General Practice)   Name of the patient: Johnathan Gould  [name]  Oct 17, 1935   Date of visit: 11/22/2016   Interval history- back pain is well controlled. He is moving his bowels. Had hallucinations yesterday and this morning  ECOG PS- 2 Pain scale- 4 Opioid associated constipation- yes  Review of systems- Review of Systems  Constitutional: Positive for malaise/fatigue. Negative for chills, fever and weight loss.  HENT: Negative for congestion, ear discharge and nosebleeds.   Eyes: Negative for blurred vision.  Respiratory: Negative for cough, hemoptysis, sputum production, shortness of breath and wheezing.   Cardiovascular: Negative for chest pain, palpitations, orthopnea and claudication.  Gastrointestinal: Negative for abdominal pain, blood in stool, constipation, diarrhea, heartburn, melena, nausea and vomiting.  Genitourinary: Negative for dysuria, flank pain, frequency, hematuria and urgency.  Musculoskeletal: Positive for back pain. Negative for joint pain and myalgias.  Skin: Negative for rash.  Neurological: Negative for dizziness, tingling, focal weakness, seizures, weakness and headaches.  Endo/Heme/Allergies: Does not bruise/bleed easily.  Psychiatric/Behavioral: Negative for depression and suicidal ideas. The patient does not have insomnia.       No Known Allergies   Past Medical History:  Diagnosis Date  . CAD (coronary artery disease)   . Cancer ([place])   . Hypertension   . RAD (reactive airway disease)      Past Surgical History:  Procedure Laterality Date  . CORONARY ANGIOPLASTY WITH STENT PLACEMENT    . IR GENERIC HISTORICAL  10/17/2016   IR NEPHROSTOMY PLACEMENT RIGHT 10/17/2016 [facility]-INTERV RAD  . IR GENERIC HISTORICAL  10/17/2016   IR NEPHROSTOMY PLACEMENT LEFT 10/17/2016  [name], MD [facility]-INTERV RAD  . KNEE SURGERY Left 2006   Laparascopy done on left knee, scar tissue taken out  . nephrostomy tubes Bilateral     Social History   Social History  . Marital status: Married    Spouse name: N/A  . Number of children: N/A  . Years of education: N/A   Occupational History  . Not on file.   Social History Main Topics  . Smoking status: Former Smoker    Packs/day: 1.00    Years: 69.50    Quit date: 11/04/2014  . Smokeless tobacco: Never Used  . Alcohol use No  . Drug use: No  . Sexual activity: Yes   Other Topics Concern  . Not on file   Social History Narrative  . No narrative on file    Family History  Problem Relation Age of Onset  . Hypertension Other   . Lung cancer Brother      Current Facility-Administered Medications:  .  acetaminophen (TYLENOL) tablet 650 mg, 650 mg, Oral, Q6H PRN, 650 mg at 11/22/16 0951 **OR** acetaminophen (TYLENOL) suppository 650 mg, 650 mg, Rectal, Q6H PRN, Idelle Crouch, MD .  bisacodyl (DULCOLAX) suppository 10 mg, 10 mg, Rectal, Daily PRN, Idelle Crouch, MD .  cefTRIAXone (ROCEPHIN) IVPB 1 g, 1 g, Intravenous, Q24H, Merlyn Lot, MD, 1 g at 11/22/16 0935 .  dexamethasone (DECADRON) tablet 4 mg, 4 mg, Oral, BID WC, Idelle Crouch, MD, 4 mg at 11/22/16 0932 .  famotidine (PEPCID) tablet 20 mg, 20 mg, Oral, QHS, Epifanio Lesches, MD, 20 mg at 11/21/16 2146 .  fentaNYL (DURAGESIC - dosed mcg/hr) patch 25 mcg, 25 mcg, Transdermal, Q72H, Idelle Crouch, MD, 25 mcg  at 11/22/16 0933 .  nitroGLYCERIN (NITROSTAT) SL tablet 0.4 mg, 0.4 mg, Sublingual, Q5 Min x 3 PRN, Idelle Crouch, MD .  ondansetron [facility]) tablet 4 mg, 4 mg, Oral, Q6H PRN **OR** ondansetron (ZOFRAN) injection 4 mg, 4 mg, Intravenous, Q6H PRN, Idelle Crouch, MD, 4 mg at 11/22/16 1149 .  ondansetron (ZOFRAN) tablet 4 mg, 4 mg, Oral, Q6H PRN, Idelle Crouch, MD .  oxyCODONE (Oxy IR/ROXICODONE) immediate release tablet 10  mg, 10 mg, Oral, Q4H PRN, Idelle Crouch, MD, 10 mg at 11/22/16 0612 .  polyethylene glycol (MIRALAX / GLYCOLAX) packet 17 g, 17 g, Oral, BID, Idelle Crouch, MD, 17 g at 11/22/16 [phone] .  senna-docusate (Senokot-S) tablet 2 tablet, 2 tablet, Oral, BID, Idelle Crouch, MD, 2 tablet at 11/22/16 0933  Physical exam:  Vitals:   11/21/16 2045 11/22/16 0500 11/22/16 0606 11/22/16 1118  BP: 109/72  135/71 120/67  Pulse: 84  82 85  Resp: 20  20 16   Temp: 97.8 F (36.6 C)  97.6 F (36.4 C) 98.3 F (36.8 C)  TempSrc: Oral  Oral Oral  SpO2: 98%  98% 96%  Weight:  178 lb 6.4 oz (80.9 kg)    Height:       Physical Exam  Constitutional: He is oriented to person, place, and time and well-developed, well-nourished, and in no distress.  HENT:  Head: Normocephalic and atraumatic.  Eyes: EOM are normal. Pupils are equal, round, and reactive to light.  Neck: Normal range of motion.  Cardiovascular: Normal rate, regular rhythm and normal heart sounds.   Pulmonary/Chest: Effort normal and breath sounds normal.  Abdominal: Soft. Bowel sounds are normal.  Neurological: He is alert and oriented to person, place, and time.  Skin: Skin is warm and dry.     CMP Latest Ref Rng & Units 11/22/2016  Glucose 65 - 99 mg/dL 161(H)  BUN 6 - 20 mg/dL 29(H)  Creatinine 0.61 - 1.24 mg/dL 1.67(H)  Sodium 135 - 145 mmol/L 134(L)  Potassium 3.5 - 5.1 mmol/L 4.6  Chloride 101 - 111 mmol/L 110  CO2 22 - 32 mmol/L 18(L)  Calcium 8.9 - 10.3 mg/dL 8.0(L)  Total Protein 6.5 - 8.1 g/dL -  Total Bilirubin 0.3 - 1.2 mg/dL -  Alkaline Phos 38 - 126 U/L -  AST 15 - 41 U/L -  ALT 17 - 63 U/L -   CBC Latest Ref Rng & Units 11/22/2016  WBC 3.8 - 10.6 K/uL 3.9  Hemoglobin 13.0 - 18.0 g/dL 7.9(L)  Hematocrit 40.0 - 52.0 % 23.8(L)  Platelets 150 - 440 K/uL 166    @IMAGES @  Dg Chest 2 View  Result Date: 11/20/2016 CLINICAL DATA:  Mental status changes and hallucinations. Hypotension. History of metastatic prostate  carcinoma. EXAM: CHEST  2 VIEW COMPARISON:  11/06/2012 FINDINGS: Stable ectasia and prominence of the thoracic aorta. No edema, airspace consolidation, nodule or pleural fluid identified. Bilateral nephrostomy tubes visible. Multiple sclerotic lesions are seen involving ribs and vertebral bodies consistent with metastatic prostate carcinoma. IMPRESSION: No acute pulmonary process identified. Skeletal metastatic disease is present consistent with known metastatic prostate carcinoma. Electronically Signed   By: [name] M.D.   On: 11/20/2016 15:07   Dg Abdomen 1 View  Result Date: 11/20/2016 CLINICAL DATA:  Bilateral nephrostomy EXAM: ABDOMEN - 1 VIEW COMPARISON:  CT scan 10/16/2016 FINDINGS: There is normal small bowel gas pattern. Some colonic stool noted in right colon and descending colon. Bilateral nephrostomy catheters  are partially visualized. Mild dextroscoliosis and degenerative changes lumbar spine. IMPRESSION: Bilateral nephrostomy catheters are partially visualized. Normal small bowel gas pattern. Electronically Signed   By: Lahoma Crocker M.D.   On: 11/20/2016 17:09   Ct Head Wo Contrast  Result Date: 11/20/2016 CLINICAL DATA:  Altered mental status, hallucinations EXAM: CT HEAD WITHOUT CONTRAST TECHNIQUE: Contiguous axial images were obtained from the base of the skull through the vertex without intravenous contrast. COMPARISON:  None. FINDINGS: Brain: No intracranial hemorrhage, mass effect or midline shift. No acute cortical infarction. Mild cerebral atrophy. Mild periventricular chronic white matter disease. No mass lesion is noted on this unenhanced scan. Vascular: Atherosclerotic calcifications of carotid siphon. Skull: No skull fracture is noted. Sinuses/Orbits: No acute findings Other: None IMPRESSION: No acute intracranial abnormality. Mild cerebral atrophy. No acute cortical infarction. Electronically Signed   By: Lahoma Crocker M.D.   On: 11/20/2016 15:09   Ct Chest Wo  Contrast  Result Date: 11/11/2016 CLINICAL DATA:  New diagnosis of presumed metastatic prostate cancer. Staging workup. EXAM: CT CHEST WITHOUT CONTRAST TECHNIQUE: Multidetector CT imaging of the chest was performed following the standard protocol without IV contrast. COMPARISON:  Abdomen and pelvis CT 10/16/2016 FINDINGS: Cardiovascular: The heart size is normal. No pericardial effusion. Coronary artery calcification is noted. Atherosclerotic calcification is noted in the wall of the thoracic aorta. Mediastinum/Nodes: 15 mm short axis right paratracheal lymph node identified. 10 mm short axis subcarinal lymph node is evident. No evidence for gross hilar lymphadenopathy although assessment is limited by the lack of intravenous contrast on today's study. The esophagus has normal imaging features. There is no axillary lymphadenopathy. Lungs/Pleura: Biapical pleural-parenchymal scarring is noted. Centrilobular emphysema noted bilaterally. Bronchial wall thickening is evident. Calcified granuloma identified right apex. 4 mm perifissural nodule seen on the minor fissure image 85 series 3, likely benign. Subsegmental atelectasis is present within the posterior right lower lobe. No focal airspace consolidation. No pulmonary edema or pleural effusion. Upper Abdomen: Visualized portion of the liver are suggests a subtle nodularity of the contour. Bilateral percutaneous nephrostomy tubes are evident. 12 mm hyperattenuating lesion is identified in the upper pole of the left kidney, similar to previous atherosclerotic calcification noted in the wall of the abdominal aorta Musculoskeletal: Widespread sclerotic metastases are identified within the visualized bony anatomy. IMPRESSION: 1. Enlarged right paratracheal lymph node may reflect metastatic disease. 2. Emphysema without definite pulmonary parenchymal metastatic involvement. 3. Widespread sclerotic bone lesions compatible with metastatic involvement. 4. Liver demonstrates  subtly nodular contour raising the question of, but not diagnostic for cirrhosis. Electronically Signed   By: Misty Stanley M.D.   On: 11/11/2016 15:20     Assessment and plan- Patient is a 82 y.o. male with newly diagnosed castrate sensitive prostate cancer (biopsy pending)  1. Patient received his 2nd dose of firmagon today. Please touch base with urology to see if inpatient biopsy is possible as this has been waiting to get done for a long time. I will discuss zytiga as an outpatient. Psa normalized after 1 dose  2. Anemia- likely multifactorial from IDA and anemia of CKD. Will start EPO as an outpatient. I have advised patient to start taking PO iron as an outpatient  3. Neoplasm related pain- continue fentanyl patch 25 mcg and prn oxycodone  4. Acute encephalopathy- possibly from UTI. Urine cx shows gram negative rods. 1/2 blood cx positive for staph which could be a contaminant. Ok to get mri brain with and without contrast to r/o mets although  unlikely.  I will see him 2-3 post discharge as an outpatient.      Visit Diagnosis 1. Acute encephalopathy   2. 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 Altered mental status, unspecified altered mental status type   4. Change in mental status   5. Hypotension, unspecified hypotension type   6. Hydronephrosis due to obstruction of ureter   7. Confusion      Dr. Randa Evens, MD, MPH [facility] at [facility] Pager- [phone] 11/22/2016 2:39 PM

## 2016-11-22 NOTE — Progress Notes (Signed)
Patient ID: Johnathan Gould, male   DOB: Mar 02, 1935, 82 y.o.   MRN: [id number]   Interventional Radiology:  Since PCN's have been in only 4 weeks and are working, will change both leg bags for new bags to address leakage problem at hub of catheters rather than proceeding with immediate tube changes, as routine change would not be recommended for another 2-4 weeks.  Will have new drainage bags sent to floor and replace old bags.  Venetia Night. Kathlene Cote, M.D Pager:  [phone]

## 2016-11-22 NOTE — Progress Notes (Signed)
Initial Nutrition Assessment  DOCUMENTATION CODES:   Severe malnutrition in context of acute illness/injury  INTERVENTION:  Provide snacks po TID between meals - RD to order.   RD will attempt education and further discussion on benefit of ONS on follow-up.   NUTRITION DIAGNOSIS:   Malnutrition (Severe) related to acute illness, cancer and cancer related treatments as evidenced by 6.4 percent weight loss over 1 month, energy intake < or equal to 50% for > or equal to 5 days.  GOAL:   Patient will meet greater than or equal to 90% of their needs  MONITOR:   PO intake, Labs, Weight trends  REASON FOR ASSESSMENT:   Malnutrition Screening Tool    ASSESSMENT:   82 y.o. male with metastatic prostate cancer who was admitted through the emergency room with hypotension and altered mental status.   -Per chart patient received dose of degarelix on 10/22/2016 and palliative radiation completed 11/16/2016. Patient not interested in chemotherapy.   Spoke with patient and family members at bedside. Patient reports his appetite is poor today. He reports he has had nausea and vomiting on and off since his diagnosis in December. He reports his intake is significantly decreased from baseline. He is only able to eat approximately 50% of 2 small meals per day, with occasional snacks of nuts.  Attempted to provide education for patient and encourage intake of ONS at this time, but patient not amenable. He is willing to try to eat snacks between meals (family reports patient enjoys peanut butter crackers).   Patient unsure of UBW but reports he has been losing weight. Per chart patient was 186 lbs on 12/23 and has lost 12 lbs (6.4% body weight) over 1 month, which is significant for time frame.   Medications reviewed and include: ceftriaxone, dexamethasone 4 mg BID, famotidine, Miralax, senna, Zofran 4 mg Q6hrs PRN.   Labs reviewed: Sodium 134, CO2 18, BUN 29, Creatinine 1.67.   Nutrition-Focused  physical exam completed. Findings are no fat depletion, no muscle depletion, and no edema.   Diet Order:  Diet 2 gram sodium Room service appropriate? Yes; Fluid consistency: Thin  Skin:  Reviewed, no issues  Last BM:  11/21/2016  Height:   Ht Readings from Last 1 Encounters:  11/20/16 5\' 8"  (1.727 m)    Weight:   Wt Readings from Last 1 Encounters:  11/22/16 178 lb 6.4 oz (80.9 kg)    Ideal Body Weight:  70 kg  BMI:  Body mass index is 27.13 kg/m.  Estimated Nutritional Needs:   Kcal:  2000-2400 (25-30 kcal/kg)  Protein:  100-120 grams (1.2-1.5 grams/kg)  Fluid:  2 L/day (25 ml/kg)  EDUCATION NEEDS:   Education needs no appropriate at this time  Willey Blade, MS, RD, LDN Pager: [phone] After Hours Pager: [phone]

## 2016-11-22 NOTE — Progress Notes (Signed)
[place] [facility] [place] NAME: Johnathan Gould    MR#:  [name]  DATE OF BIRTH:  [birth year]    CHIEF COMPLAINT:   Chief Complaint  Patient presents with  . Altered Mental Status   Has had episodes of confusion with some hallucination. Feels weak. Nausea. No vomiting. Had 3 bowel movements yesterday. Afebrile.  REVIEW OF SYSTEMS:   ROS CONSTITUTIONAL: No fever, fatigue or weakness.  EYES: No blurred or double vision.  EARS, NOSE, AND THROAT: No tinnitus or ear pain.  RESPIRATORY: No cough, shortness of breath, wheezing or hemoptysis.  CARDIOVASCULAR: No chest pain, orthopnea, edema.  GASTROINTESTINAL: No nausea, vomiting, diarrhea or abdominal pain.  GENITOURINARY: No dysuria, hematuria.  ENDOCRINE: No polyuria, nocturia,  HEMATOLOGY: No anemia, easy bruising or bleeding SKIN: No rash or lesion. MUSCULOSKELETAL: No joint pain or arthritis.   NEUROLOGIC: No tingling, numbness, weakness.  PSYCHIATRY: No anxiety or depression.   DRUG ALLERGIES:  No Known Allergies  VITALS:  Blood pressure 120/67, pulse 85, temperature 98.3 F (36.8 C), temperature source Oral, resp. rate 16, height 5\' 8"  (1.727 m), weight 80.9 kg (178 lb 6.4 oz), SpO2 96 %.  PHYSICAL EXAMINATION:  GENERAL:  82 y.o.-year-old patient lying in the bed with no acute distress.  EYES: Pupils equal, round, reactive to light and accommodation. No scleral icterus. Extraocular muscles intact.  HEENT: Head atraumatic, normocephalic. Oropharynx and nasopharynx clear.  NECK:  Supple, no jugular venous distention. No thyroid enlargement, no tenderness.  LUNGS: Normal breath sounds bilaterally, no wheezing, rales,rhonchi or crepitation. No use of accessory muscles of respiration.  CARDIOVASCULAR: S1, S2 normal. No murmurs, rubs, or gallops.  ABDOMEN: Soft, nontender, nondistended. Bowel sounds present. No organomegaly or mass. Bilateral nephrostomy tubes. EXTREMITIES: No  pedal edema, cyanosis, or clubbing.  NEUROLOGIC: Cranial nerves II through XII are intact. Muscle strength 5/5 in all extremities. Sensation intact. Gait not checked.  PSYCHIATRIC: The patient is alert and awake SKIN: No obvious rash, lesion, or ulcer.    LABORATORY PANEL:   CBC  Recent Labs Lab 11/22/16 0550  WBC 3.9  HGB 7.9*  HCT 23.8*  PLT 166   ------------------------------------------------------------------------------------------------------------------  Chemistries   Recent Labs Lab 11/21/16 0613 11/22/16 0550  NA 138 134*  K 4.3 4.6  CL 112* 110  CO2 20* 18*  GLUCOSE 97 161*  BUN 39* 29*  CREATININE 2.23* 1.67*  CALCIUM 7.8* 8.0*  AST 12*  --   ALT 9*  --   ALKPHOS 204*  --   BILITOT 0.5  --    ------------------------------------------------------------------------------------------------------------------  Cardiac Enzymes No results for input(s): TROPONINI in the last 168 hours. ------------------------------------------------------------------------------------------------------------------  RADIOLOGY:  Dg Chest 2 View  Result Date: 11/20/2016 CLINICAL DATA:  Mental status changes and hallucinations. Hypotension. History of metastatic prostate carcinoma. EXAM: CHEST  2 VIEW COMPARISON:  11/06/2012 FINDINGS: Stable ectasia and prominence of the thoracic aorta. No edema, airspace consolidation, nodule or pleural fluid identified. Bilateral nephrostomy tubes visible. Multiple sclerotic lesions are seen involving ribs and vertebral bodies consistent with metastatic prostate carcinoma. IMPRESSION: No acute pulmonary process identified. Skeletal metastatic disease is present consistent with known metastatic prostate carcinoma. Electronically Signed   By: [name] M.D.   On: 11/20/2016 15:07   Dg Abdomen 1 View  Result Date: 11/20/2016 CLINICAL DATA:  Bilateral nephrostomy EXAM: ABDOMEN - 1 VIEW COMPARISON:  CT scan 10/16/2016 FINDINGS: There is  normal small bowel gas pattern. Some colonic stool noted in  right colon and descending colon. Bilateral nephrostomy catheters are partially visualized. Mild dextroscoliosis and degenerative changes lumbar spine. IMPRESSION: Bilateral nephrostomy catheters are partially visualized. Normal small bowel gas pattern. Electronically Signed   By: Lahoma Crocker M.D.   On: 11/20/2016 17:09   Ct Head Wo Contrast  Result Date: 11/20/2016 CLINICAL DATA:  Altered mental status, hallucinations EXAM: CT HEAD WITHOUT CONTRAST TECHNIQUE: Contiguous axial images were obtained from the base of the skull through the vertex without intravenous contrast. COMPARISON:  None. FINDINGS: Brain: No intracranial hemorrhage, mass effect or midline shift. No acute cortical infarction. Mild cerebral atrophy. Mild periventricular chronic white matter disease. No mass lesion is noted on this unenhanced scan. Vascular: Atherosclerotic calcifications of carotid siphon. Skull: No skull fracture is noted. Sinuses/Orbits: No acute findings Other: None IMPRESSION: No acute intracranial abnormality. Mild cerebral atrophy. No acute cortical infarction. Electronically Signed   By: Lahoma Crocker M.D.   On: 11/20/2016 15:09    EKG:   Orders placed or performed during the hospital encounter of 11/20/16  . ED EKG  . ED EKG    ASSESSMENT AND PLAN:   * Acute encephalopathy could be due to pain medications We'll get MRI of the brain for metastases. Discussed Dr. [name] of oncology  * Nausea and vomiting secondary to constipation and disease processes from cancer Improved now that he had bowel movement Nausea medications as needed  # Chronic pain due to advanced prostate cancer: on multiple pain medicines. Which can cause constipation and confusion. Continue stool softeners, continue pain medicines. Code status is DO NOT RESUSCITATE, discussed with family.  # Metastatic prostate cancer with bilateral nephrostomy tubes because of bilateral ureteral  obstruction: Seen by urology, tubes are placed in December 2017.   # Hypotension - resolved Likely dehydration No fever. Blood Culture positive for staph 1 out of 2 bottles. Could be contaminant.  All the records are reviewed and case discussed with Care Management/Social Workerr. Management plans discussed with the patient, family and they are in agreement.  CODE STATUS: DNR  TOTAL TIME TAKING CARE OF THIS PATIENT: 53minutes.   POSSIBLE D/C IN 1-2 DAYS, DEPENDING ON CLINICAL CONDITION.   Hillary Bow R M.D on 11/22/2016 at 12:39 PM  Between 7am to 6pm - Pager - [phone]  After 6pm go to [email or web address] - password [email or web address] [facility]  [place] [facility]  Office  [phone]  CC: Primary care physician; No PCP Per Patient   Note: This dictation was prepared with Dragon dictation along with smaller phrase technology. Any transcriptional errors that result from this process are unintentional.

## 2016-11-23 ENCOUNTER — Other Ambulatory Visit: Payer: Self-pay | Admitting: Oncology

## 2016-11-23 LAB — BASIC METABOLIC PANEL
ANION GAP: 9 (ref 5–15)
BUN: 33 mg/dL — AB (ref 6–20)
CO2: 19 mmol/L — ABNORMAL LOW (ref 22–32)
Calcium: 8.4 mg/dL — ABNORMAL LOW (ref 8.9–10.3)
Chloride: 109 mmol/L (ref 101–111)
Creatinine, Ser: 1.79 mg/dL — ABNORMAL HIGH (ref 0.61–1.24)
GFR, EST AFRICAN AMERICAN: 40 mL/min — AB (ref >60)
GFR, EST NON AFRICAN AMERICAN: 34 mL/min — AB (ref >60)
Glucose, Bld: 114 mg/dL — ABNORMAL HIGH (ref 65–99)
POTASSIUM: 5.2 mmol/L — AB (ref 3.5–5.1)
SODIUM: 137 mmol/L (ref 135–145)

## 2016-11-23 LAB — CBC WITH DIFFERENTIAL/PLATELET
BASOS ABS: 0 10*3/uL (ref 0–0.1)
Basophils Relative: 0 %
EOS PCT: 0 %
Eosinophils Absolute: 0 10*3/uL (ref 0–0.7)
HCT: 26 % — ABNORMAL LOW (ref 40.0–52.0)
Hemoglobin: 8.8 g/dL — ABNORMAL LOW (ref 13.0–18.0)
Lymphocytes Relative: 6 %
Lymphs Abs: 0.4 10*3/uL — ABNORMAL LOW (ref 1.0–3.6)
MCH: 30.1 pg (ref 26.0–34.0)
MCHC: 34 g/dL (ref 32.0–36.0)
MCV: 88.6 fL (ref 80.0–100.0)
Monocytes Absolute: 0.4 10*3/uL (ref 0.2–1.0)
Monocytes Relative: 6 %
NEUTROS ABS: 5.4 10*3/uL (ref 1.4–6.5)
Neutrophils Relative %: 88 %
Platelets: 191 10*3/uL (ref 150–440)
RBC: 2.93 MIL/uL — AB (ref 4.40–5.90)
RDW: 14.7 % — ABNORMAL HIGH (ref 11.5–14.5)
WBC: 6.2 10*3/uL (ref 3.8–10.6)

## 2016-11-23 LAB — URINE CULTURE

## 2016-11-23 LAB — TESTOSTERONE: TESTOSTERONE: 10 ng/dL — AB (ref 264–916)

## 2016-11-23 MED ORDER — SODIUM POLYSTYRENE SULFONATE 15 GM/60ML PO SUSP
15.0000 g | Freq: Every day | ORAL | Status: DC
  Administered 2016-11-23: 17:00:00 15 g via ORAL
  Filled 2016-11-23: qty 60

## 2016-11-23 MED ORDER — CIPROFLOXACIN HCL 500 MG PO TABS: 500.0000 mg | ORAL_TABLET | Freq: Two times a day (BID) | ORAL | 0 refills | Status: DC

## 2016-11-23 NOTE — Discharge Instructions (Signed)
Resume diet and activity as before ° ° °

## 2016-11-23 NOTE — Progress Notes (Signed)
Discussed discharge instructions and medications with pt, his wife, and his son. IV removed. Pt's son had questions about meds.  Called Dr. Darvin Neighbours because pt does not have dexadron at home.  Per Dr. Darvin Neighbours course is complete and does not want pt to continue.  Made pt and his son aware.  Pt transported home via car by his son.  Clarise Cruz, RN

## 2016-11-23 NOTE — Evaluation (Signed)
Physical Therapy Evaluation Patient Details Name: Johnathan Gould MRN: [id number] DOB: 1935-03-24 Today's Date: 11/23/2016   History of Present Illness  presented to ER secondary to weakness, hypotension; admitted with acute encephalopathy, dehydration.  PMH significant for diffuse metastatic prostate cancer.  Clinical Impression  Upon evaluation, patient alert and oriented to basic information; follows simple commands and demonstrates fair insight.  Bilat UE/LE strength and ROM grossly symmetrical and WFL for basic transfers/mobility.  Able to complete bed mobility with mod indep; sit/stand, basic transfers and gait (200') with RW, cga.  Narrowed BOS with intermittent scissoring, esp with head turns and direction changes; cga/min assist for recovery. Would benefit from skilled PT to address above deficits and promote optimal return to PLOF; Recommend transition to [place] upon discharge from acute hospitalization for home safety assessment, higher level balance/safety awareness training.     Follow Up Recommendations Home health PT    Equipment Recommendations       Recommendations for Other Services       Precautions / Restrictions Precautions Precautions: Fall Restrictions Weight Bearing Restrictions: No      Mobility  Bed Mobility Overal bed mobility: Modified Independent                Transfers Overall transfer level: Needs assistance Equipment used: Rolling walker (2 wheeled) Transfers: Sit to/from Stand Sit to Stand: Supervision;Min guard            Ambulation/Gait Ambulation/Gait assistance: Min guard;Min assist Ambulation Distance (Feet): 200 Feet Assistive device: Rolling walker (2 wheeled)       General Gait Details: reciprocal stepping pattern with narrowed BOS, intermittent scissoring with head turns/direction changes.  Mild sway requiring cga/min assist from therapist for recovery.  Stairs            Wheelchair Mobility    Modified  [place] (Stroke Patients Only)       Balance Overall balance assessment: Needs assistance Sitting-balance support: No upper extremity supported;Feet supported Sitting balance-Leahy Scale: Good     Standing balance support: Bilateral upper extremity supported Standing balance-Leahy Scale: Fair                               Pertinent Vitals/Pain Pain Assessment: No/denies pain    Home Living Family/patient expects to be discharged to:: Private residence Living Arrangements: Spouse/significant other Available Help at Discharge: Family Type of Home: House Home Access: Stairs to enter Entrance Stairs-Rails: None Entrance Stairs-Number of Steps: 2 Home Layout: One level Home Equipment: Environmental consultant - 2 wheels;Cane - single point      Prior Function Level of Independence: Needs assistance         Comments: Assist as needed from wife for ADLs, household mobility/activities.  Prior to nephrostomy tube placement (Dec 2017), was indep with ADls, household and community activities.  Denies fall history.     Hand Dominance        Extremity/Trunk Assessment   Upper Extremity Assessment Upper Extremity Assessment: Overall WFL for tasks assessed    Lower Extremity Assessment Lower Extremity Assessment: Overall WFL for tasks assessed (grossly at least 4+/5 throughout)       Communication      Cognition Arousal/Alertness: Awake/alert Behavior During Therapy: WFL for tasks assessed/performed Overall Cognitive Status: Within Functional Limits for tasks assessed                      General Comments  Exercises     Assessment/Plan    PT Assessment Patient needs continued PT services  PT Problem List Decreased activity tolerance;Decreased balance;Decreased mobility;Decreased knowledge of precautions;Decreased safety awareness;Decreased knowledge of use of DME          PT Treatment Interventions DME instruction;Gait training;Stair  training;Functional mobility training;Therapeutic activities;Therapeutic exercise;Balance training;Patient/family education    PT Goals (Current goals can be found in the Care Plan section)  Acute Rehab PT Goals Patient Stated Goal: to return home today PT Goal Formulation: With patient/family Time For Goal Achievement: 12/07/16 Potential to Achieve Goals: Good    Frequency Min 2X/week   Barriers to discharge        Co-evaluation               End of Session Equipment Utilized During Treatment: Gait belt Activity Tolerance: Patient tolerated treatment well Patient left: in bed;with call bell/phone within reach;with bed alarm set;with family/visitor present Nurse Communication: Mobility status         Time: 1537-1550 PT Time Calculation (min) (ACUTE ONLY): 13 min   Charges:   PT Evaluation $PT Eval Low Complexity: 1 Procedure     PT G Codes:        Unique Sillas H. Owens Shark, PT, DPT, NCS 11/23/16, 4:41 PM [phone]

## 2016-11-23 NOTE — Care Management Important Message (Signed)
Important Message  Patient Details  Name: Johnathan Gould MRN: [id number] Date of Birth: 1935-04-26   Medicare Important Message Given:  Yes    Shelbie Ammons, RN 11/23/2016, 8:33 AM

## 2016-11-23 NOTE — Care Management (Signed)
[facility] is the agency of choice for Johnathan Gould for home services. Discussed that [place] is not accepting [facility]. States he doesn't want any other agency. Declines services at this time                                 Discharge to home today per Dr. Oleh Genin RN MSN CCM Care Management

## 2016-11-24 LAB — BLOOD GAS, VENOUS
Acid-base deficit: 6.1 mmol/L — ABNORMAL HIGH (ref 0.0–2.0)
Bicarbonate: 20.6 mmol/L (ref 20.0–28.0)
PATIENT TEMPERATURE: 37
PCO2 VEN: 46 mmHg (ref 44.0–60.0)
pH, Ven: 7.26 (ref 7.250–7.430)
pO2, Ven: <31 mmHg — CL (ref 32.0–45.0)

## 2016-11-25 ENCOUNTER — Inpatient Hospital Stay: Payer: Medicare Other

## 2016-11-25 ENCOUNTER — Encounter: Payer: Self-pay | Admitting: Oncology

## 2016-11-25 ENCOUNTER — Inpatient Hospital Stay (HOSPITAL_BASED_OUTPATIENT_CLINIC_OR_DEPARTMENT_OTHER): Payer: Medicare Other | Admitting: Oncology

## 2016-11-25 ENCOUNTER — Telehealth: Payer: Self-pay | Admitting: *Deleted

## 2016-11-25 VITALS — BP 118/78 | HR 92

## 2016-11-25 VITALS — BP 111/73 | HR 120 | Temp 97.4°F | Resp 18 | Ht 68.0 in | Wt 171.3 lb

## 2016-11-25 DIAGNOSIS — R531 Weakness: Secondary | ICD-10-CM | POA: Diagnosis not present

## 2016-11-25 DIAGNOSIS — R000 Tachycardia, unspecified: Secondary | ICD-10-CM

## 2016-11-25 DIAGNOSIS — D631 Anemia in chronic kidney disease: Secondary | ICD-10-CM

## 2016-11-25 DIAGNOSIS — I10 Essential (primary) hypertension: Secondary | ICD-10-CM

## 2016-11-25 DIAGNOSIS — C61 Malignant neoplasm of prostate: Secondary | ICD-10-CM

## 2016-11-25 DIAGNOSIS — R5383 Other fatigue: Secondary | ICD-10-CM | POA: Diagnosis not present

## 2016-11-25 DIAGNOSIS — E875 Hyperkalemia: Secondary | ICD-10-CM | POA: Diagnosis not present

## 2016-11-25 DIAGNOSIS — Z79899 Other long term (current) drug therapy: Secondary | ICD-10-CM | POA: Diagnosis not present

## 2016-11-25 DIAGNOSIS — C7951 Secondary malignant neoplasm of bone: Secondary | ICD-10-CM | POA: Diagnosis not present

## 2016-11-25 DIAGNOSIS — G893 Neoplasm related pain (acute) (chronic): Secondary | ICD-10-CM

## 2016-11-25 DIAGNOSIS — I517 Cardiomegaly: Secondary | ICD-10-CM

## 2016-11-25 DIAGNOSIS — I2510 Atherosclerotic heart disease of native coronary artery without angina pectoris: Secondary | ICD-10-CM

## 2016-11-25 DIAGNOSIS — R599 Enlarged lymph nodes, unspecified: Secondary | ICD-10-CM

## 2016-11-25 DIAGNOSIS — N401 Enlarged prostate with lower urinary tract symptoms: Secondary | ICD-10-CM

## 2016-11-25 DIAGNOSIS — J439 Emphysema, unspecified: Secondary | ICD-10-CM | POA: Diagnosis not present

## 2016-11-25 DIAGNOSIS — I959 Hypotension, unspecified: Secondary | ICD-10-CM

## 2016-11-25 DIAGNOSIS — N189 Chronic kidney disease, unspecified: Secondary | ICD-10-CM

## 2016-11-25 DIAGNOSIS — J45909 Unspecified asthma, uncomplicated: Secondary | ICD-10-CM | POA: Diagnosis not present

## 2016-11-25 DIAGNOSIS — N131 Hydronephrosis with ureteral stricture, not elsewhere classified: Secondary | ICD-10-CM

## 2016-11-25 DIAGNOSIS — Z87891 Personal history of nicotine dependence: Secondary | ICD-10-CM

## 2016-11-25 DIAGNOSIS — K5730 Diverticulosis of large intestine without perforation or abscess without bleeding: Secondary | ICD-10-CM | POA: Diagnosis not present

## 2016-11-25 DIAGNOSIS — N3289 Other specified disorders of bladder: Secondary | ICD-10-CM

## 2016-11-25 DIAGNOSIS — M2550 Pain in unspecified joint: Secondary | ICD-10-CM | POA: Diagnosis not present

## 2016-11-25 DIAGNOSIS — D649 Anemia, unspecified: Secondary | ICD-10-CM | POA: Diagnosis not present

## 2016-11-25 DIAGNOSIS — N179 Acute kidney failure, unspecified: Secondary | ICD-10-CM

## 2016-11-25 DIAGNOSIS — N329 Bladder disorder, unspecified: Secondary | ICD-10-CM | POA: Diagnosis not present

## 2016-11-25 DIAGNOSIS — C779 Secondary and unspecified malignant neoplasm of lymph node, unspecified: Secondary | ICD-10-CM

## 2016-11-25 DIAGNOSIS — M545 Low back pain: Secondary | ICD-10-CM

## 2016-11-25 DIAGNOSIS — R110 Nausea: Secondary | ICD-10-CM

## 2016-11-25 DIAGNOSIS — Z801 Family history of malignant neoplasm of trachea, bronchus and lung: Secondary | ICD-10-CM

## 2016-11-25 LAB — CULTURE, BLOOD (ROUTINE X 2): CULTURE: NO GROWTH

## 2016-11-25 LAB — CBC WITH DIFFERENTIAL/PLATELET
Basophils Absolute: 0 10*3/uL (ref 0–0.1)
Basophils Relative: 0 %
EOS ABS: 0.1 10*3/uL (ref 0–0.7)
EOS PCT: 2 %
HCT: 28.6 % — ABNORMAL LOW (ref 40.0–52.0)
Hemoglobin: 9.5 g/dL — ABNORMAL LOW (ref 13.0–18.0)
LYMPHS ABS: 0.4 10*3/uL — AB (ref 1.0–3.6)
LYMPHS PCT: 6 %
MCH: 29.7 pg (ref 26.0–34.0)
MCHC: 33.3 g/dL (ref 32.0–36.0)
MCV: 89.2 fL (ref 80.0–100.0)
MONO ABS: 0.3 10*3/uL (ref 0.2–1.0)
MONOS PCT: 5 %
Neutro Abs: 6.1 10*3/uL (ref 1.4–6.5)
Neutrophils Relative %: 87 %
PLATELETS: 258 10*3/uL (ref 150–440)
RBC: 3.2 MIL/uL — AB (ref 4.40–5.90)
RDW: 14.8 % — AB (ref 11.5–14.5)
WBC: 7 10*3/uL (ref 3.8–10.6)

## 2016-11-25 LAB — COMPREHENSIVE METABOLIC PANEL
ALT: 14 U/L — AB (ref 17–63)
ANION GAP: 9 (ref 5–15)
AST: 14 U/L — ABNORMAL LOW (ref 15–41)
Albumin: 2.9 g/dL — ABNORMAL LOW (ref 3.5–5.0)
Alkaline Phosphatase: 173 U/L — ABNORMAL HIGH (ref 38–126)
BUN: 46 mg/dL — ABNORMAL HIGH (ref 6–20)
CHLORIDE: 104 mmol/L (ref 101–111)
CO2: 20 mmol/L — AB (ref 22–32)
CREATININE: 2.44 mg/dL — AB (ref 0.61–1.24)
Calcium: 8 mg/dL — ABNORMAL LOW (ref 8.9–10.3)
GFR, EST AFRICAN AMERICAN: 27 mL/min — AB (ref >60)
GFR, EST NON AFRICAN AMERICAN: 24 mL/min — AB (ref >60)
Glucose, Bld: 108 mg/dL — ABNORMAL HIGH (ref 65–99)
POTASSIUM: 4.2 mmol/L (ref 3.5–5.1)
SODIUM: 133 mmol/L — AB (ref 135–145)
Total Bilirubin: 0.3 mg/dL (ref 0.3–1.2)
Total Protein: 6 g/dL — ABNORMAL LOW (ref 6.5–8.1)

## 2016-11-25 LAB — PSA: PSA: 2.52 ng/mL (ref 0.00–4.00)

## 2016-11-25 MED ORDER — SODIUM CHLORIDE 0.9 % IV SOLN
INTRAVENOUS | Status: AC
  Administered 2016-11-25: 15:00:00 via INTRAVENOUS
  Filled 2016-11-25: qty 1000

## 2016-11-25 MED ORDER — DARBEPOETIN ALFA 100 MCG/0.5ML IJ SOSY
36.0000 ug | PREFILLED_SYRINGE | Freq: Once | INTRAMUSCULAR | Status: AC
  Administered 2016-11-25: 36 ug via SUBCUTANEOUS
  Filled 2016-11-25: qty 0.5

## 2016-11-25 NOTE — Discharge Summary (Signed)
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 CAD (coronary artery disease)   . Cancer ([place])   . Hypertension   . RAD (reactive airway disease)      ADMITTING HISTORY  Chief Complaint: weakness  HPI: Johnathan Gould is a 82 y.o. male has a past medical history significant for metastatic prostate cancer on treatment now with diffuse pain with nausea and weakness. hypotensive in ER. Has nephrostomy tubes in place but no clear source of infection. BP remains low in ER despite IV fluids. He is now admitted. No vomiting or diarrhea. Has had low grade fever. Denies CP or SOB  HOSPITAL COURSE:   * Pseudomonas UTI We'll treat with ciprofloxacin which it is sensitive to for 5 more days.  * Acute encephalopathy could be due to pain medications or UTI MRI of the brain showed nothing acute  * Nausea and vomiting secondary to constipation and disease processes from cancer Improved now that he had bowel movement Nausea medications as needed  # Chronic pain due to advanced prostate cancer on multiple pain medicines. Which can cause constipation and confusion. Continue stool softeners, continue pain medicines. Code status is DO NOT RESUSCITATE,  discussed with family.  # Metastatic prostate cancer with bilateral nephrostomy tubes because of bilateral ureteral obstruction: Seen by urology, tubes are placed in December 2017.  Tubes replaced while in the hospital.  # Hypotension - resolved Likely dehydration Blood pressure medications stopped at discharge  Blood cultures showed 1 out of 2 bottles with coagulase-negative staph. This was a contaminant.  CONSULTS OBTAINED:  Treatment Team:  Irine Seal, MD Lequita Asal, MD  DRUG ALLERGIES:  No Known Allergies  DISCHARGE MEDICATIONS:   Discharge Medication List as of 11/23/2016  5:09 PM    START taking these medications   Details  ciprofloxacin (CIPRO) 500 MG tablet Take 1 tablet (500 mg total) by mouth 2 (two) times daily., Starting Tue 11/23/2016, Normal      CONTINUE these medications which have NOT CHANGED   Details  fentaNYL (DURAGESIC - DOSED MCG/HR) 25 MCG/HR patch Place 1 patch (25 mcg total) onto the skin every 3 (three) days., Starting Fri 11/19/2016, Print    nitroGLYCERIN (NITROSTAT) 0.4 MG SL tablet Place 0.4 mg under the tongue every 5 (five) minutes x 3 doses as needed for chest pain., Starting Wed 06/23/2016, Historical Med    ondansetron (ZOFRAN) 4 MG tablet Take 1 tablet (4 mg total) by mouth every 6 (six) hours as needed for nausea., Starting Tue 11/02/2016, Normal    Oxycodone HCl 10 MG TABS Take 1 tablet (10 mg total) by mouth every 4 (four) hours as needed., Starting Fri 11/19/2016, Print    polyethylene glycol (MIRALAX / GLYCOLAX) packet Take 17 g by mouth daily., Historical Med  senna-docusate (SENOKOT-S) 8.6-50 MG tablet Take 2 tablets by mouth 2 (two) times daily., Starting Sat 10/23/2016, Normal    SPS 15 GM/60ML suspension Take 60 mLs by mouth daily., Starting Fri 11/12/2016, Historical Med    dexamethasone (DECADRON) 4 MG tablet Take 1 tablet (4 mg total) by mouth 2 (two) times daily with a meal., Starting Fri 11/05/2016, Normal      STOP  taking these medications     lisinopril (PRINIVIL,ZESTRIL) 5 MG tablet      metoprolol (LOPRESSOR) 50 MG tablet         Today   VITAL SIGNS:  Blood pressure 112/73, pulse 96, temperature 98 F (36.7 C), temperature source Oral, resp. rate 16, height 5\' 8"  (1.727 m), weight 80.9 kg (178 lb 6.4 oz), SpO2 98 %.  I/O:  No intake or output data in the 24 hours ending 11/25/16 1518  PHYSICAL EXAMINATION:  Physical Exam  GENERAL:  82 y.o.-year-old patient lying in the bed with no acute distress.  LUNGS: Normal breath sounds bilaterally, no wheezing, rales,rhonchi or crepitation. No use of accessory muscles of respiration.  CARDIOVASCULAR: S1, S2 normal. No murmurs, rubs, or gallops.  ABDOMEN: Soft, non-tender, non-distended. Bowel sounds present. No organomegaly or mass.nephrostomy tubes in place  NEUROLOGIC: Moves all 4 extremities. PSYCHIATRIC: The patient is alert and awake. SKIN: No obvious rash, lesion, or ulcer.   DATA REVIEW:   CBC  Recent Labs Lab 11/25/16 1110  WBC 7.0  HGB 9.5*  HCT 28.6*  PLT 258    Chemistries   Recent Labs Lab 11/25/16 1110  NA 133*  K 4.2  CL 104  CO2 20*  GLUCOSE 108*  BUN 46*  CREATININE 2.44*  CALCIUM 8.0*  AST 14*  ALT 14*  ALKPHOS 173*  BILITOT 0.3    Cardiac Enzymes No results for input(s): TROPONINI in the last 168 hours.  Microbiology Results  Results for orders placed or performed during the hospital encounter of 11/20/16  Urine culture     Status: Abnormal   Collection Time: 11/20/16  3:26 PM  Result Value Ref Range Status   Specimen Description URINE, RANDOM  Final   Special Requests NONE  Final   Culture >=100,000 COLONIES/mL FLAVIMONAS ORYZIHABITANS (A)  Final   Report Status 11/23/2016 FINAL  Final   Organism ID, Bacteria FLAVIMONAS ORYZIHABITANS (A)  Final      Susceptibility   Flavimonas oryzihabitans - MIC*    CEFAZOLIN >=64 RESISTANT Resistant     CEFTRIAXONE 4 SENSITIVE Sensitive     CIPROFLOXACIN  <=0.25 SENSITIVE Sensitive     GENTAMICIN <=1 SENSITIVE Sensitive     IMIPENEM <=0.25 SENSITIVE Sensitive     TRIMETH/SULFA <=20 SENSITIVE Sensitive     PIP/TAZO <=4 SENSITIVE Sensitive     * >=100,000 COLONIES/mL FLAVIMONAS ORYZIHABITANS  Blood culture (routine x 2)     Status: Abnormal   Collection Time: 11/20/16  3:27 PM  Result Value Ref Range Status   Specimen Description BLOOD LEFT ARM  Final   Special Requests   Final    BOTTLES DRAWN AEROBIC AND ANAEROBIC AER [place] ANA 5CC   Culture  Setup Time   Final    AEROBIC BOTTLE ONLY GRAM POSITIVE COCCI CRITICAL RESULT CALLED TO, READ BACK BY AND VERIFIED WITH: NATE COOKSON AT 2310 11/21/16 MSS.    Culture (A)  Final    STAPHYLOCOCCUS SPECIES (COAGULASE NEGATIVE) THE SIGNIFICANCE OF ISOLATING THIS ORGANISM FROM A SINGLE SET OF BLOOD CULTURES WHEN MULTIPLE SETS ARE  DRAWN IS UNCERTAIN. PLEASE NOTIFY THE MICROBIOLOGY DEPARTMENT WITHIN ONE WEEK IF SPECIATION AND SENSITIVITIES ARE REQUIRED. Performed at [facility], [place] [address] [place], [place] [address]    Report Status 11/25/2016 FINAL  Final  Blood Culture ID Panel (Reflexed)     Status: Abnormal   Collection Time: 11/20/16  3:27 PM  Result Value Ref Range Status   Enterococcus species NOT DETECTED NOT DETECTED Final   Listeria monocytogenes NOT DETECTED NOT DETECTED Final   Staphylococcus species DETECTED (A) NOT DETECTED Final    Comment: CRITICAL RESULT CALLED TO, READ BACK BY AND VERIFIED WITH: NATE COOKSON AT 2310 11/21/16 MSS    Staphylococcus aureus NOT DETECTED NOT DETECTED Final   Methicillin resistance NOT DETECTED NOT DETECTED Final   Streptococcus species NOT DETECTED NOT DETECTED Final   Streptococcus agalactiae NOT DETECTED NOT DETECTED Final   Streptococcus pneumoniae NOT DETECTED NOT DETECTED Final   Streptococcus pyogenes NOT DETECTED NOT DETECTED Final   Acinetobacter baumannii NOT DETECTED NOT DETECTED Final   Enterobacteriaceae species NOT DETECTED NOT  DETECTED Final   Enterobacter cloacae complex NOT DETECTED NOT DETECTED Final   Escherichia coli NOT DETECTED NOT DETECTED Final   Klebsiella oxytoca NOT DETECTED NOT DETECTED Final   Klebsiella pneumoniae NOT DETECTED NOT DETECTED Final   Proteus species NOT DETECTED NOT DETECTED Final   Serratia marcescens NOT DETECTED NOT DETECTED Final   Haemophilus influenzae NOT DETECTED NOT DETECTED Final   Neisseria meningitidis NOT DETECTED NOT DETECTED Final   Pseudomonas aeruginosa NOT DETECTED NOT DETECTED Final   Candida albicans NOT DETECTED NOT DETECTED Final   Candida glabrata NOT DETECTED NOT DETECTED Final   Candida krusei NOT DETECTED NOT DETECTED Final   Candida parapsilosis NOT DETECTED NOT DETECTED Final   Candida tropicalis NOT DETECTED NOT DETECTED Final  Blood culture (routine x 2)     Status: None   Collection Time: 11/20/16  6:15 PM  Result Value Ref Range Status   Specimen Description BLOOD  RIGHT AC  Final   Special Requests   Final    BOTTLES DRAWN AEROBIC AND ANAEROBIC  AER 7 ML ANA 7 ML   Culture NO GROWTH 5 DAYS  Final   Report Status 11/25/2016 FINAL  Final    RADIOLOGY:  No results found.  Follow up with PCP in 1 week.  Management plans discussed with the patient, family and they are in agreement.  CODE STATUS:  Code Status History    Date Active Date Inactive Code Status Order ID Comments User Context   11/20/2016  9:35 PM 11/23/2016  9:51 PM DNR [id number]  Idelle Crouch, MD Inpatient   10/16/2016 11:58 PM 10/23/2016  8:58 PM DNR [id number]  Lance Coon, MD Inpatient    Questions for Most Recent Historical Code Status (Order [id number])    Question Answer Comment   In the event of cardiac or respiratory ARREST Do not call a "code blue"    In the event of cardiac or respiratory ARREST Do not perform Intubation, CPR, defibrillation or ACLS    In the event of cardiac or respiratory ARREST Use medication by any route, position, wound care, and other measures  to relive pain and suffering. May use oxygen, suction and manual treatment of airway obstruction as needed for comfort.       TOTAL TIME TAKING CARE OF THIS PATIENT ON DAY OF DISCHARGE: more than 30 minutes.   Hillary Bow R M.D on 11/25/2016 at 3:18 PM  Between 7am to 6pm - Pager - [phone]  After 6pm go to [email or web address] - password [email or web address] [facility]  Office  [phone]  CC: Primary care physician; No PCP Per Patient  Note: This dictation was prepared with Dragon dictation along with smaller phrase technology. Any transcriptional errors that result from this process are unintentional.

## 2016-11-25 NOTE — Telephone Encounter (Signed)
-----   Message from Nickie Retort, MD sent at 11/04/2016  2:21 PM EST ----- Can you get patient in for prostate biopsy and give him prep instructions. I need to be the one do prostate biopsy he needs a longer appt because he knows he is getting one when his oncologist sees him today. But I need to talk to him more about it before performing it. Thanks.

## 2016-11-25 NOTE — Telephone Encounter (Signed)
Appt scheduled for Prostate biopsy  11/30/2016 11:15 AM [facility]

## 2016-11-25 NOTE — Progress Notes (Signed)
PT has been in [place]. Due to b/p dec., creat increase, dec. Oral intake.  He states he is feeling better but not drinking very much.

## 2016-11-25 NOTE — Telephone Encounter (Signed)
Left message with betsy and she will pass on the fath and have his nurse to call me back.  Pt was on lisinopril and metoprolol and was taken off the lisinopril on 1/19 when b/p was 72/49. I had faxed that info to [facility] office on that date.  Then pt was admitted and stayed for 3 days and now we are seeing him office today. B/p better111/73 but pulse 120.  We suspect that it is because of pt not on metoprolol because he was dicharged from hosp. To not take any b/p pills. Dr. [name] feels that pt should be on low dose metoprolol but wants to check with dr Ubaldo Glassing and see.

## 2016-11-25 NOTE — Progress Notes (Signed)
Hematology/Oncology Consult note [facility]  [phone] [phone]  Patient Care Team: No Pcp Per Patient as PCP - General (General Practice)   Name of the patient: Johnathan Gould  LQ:[id number]  [birth year]   Date of visit: 11/25/16  Diagnosis- 1. 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 Anemia likely from CKD and IDA  Chief complaint/ Reason for visit- post hospital discharge f/u  Heme/Onc history: patient is a 81 year old male was recently admitted to the hospital on 10/16/2016 with symptoms of left hip pain and renal failure. He was found to have bilateral hydronephrosis and is status post bilateral nephrostomy tube placement. CT abdomen showed an irregular polypoid mass at the posterior aspect of the bladder highly concerning for bladder carcinoma. Extensive bulky adenopathy throughout the abdomen and pelvis consistent with nodal metastases. Widespread osseous metastatic disease. Bone scan showed multifocal areas of increased activity throughout the pelvis, bilateral femurs, rib cage, right scapula, thoracic cervical and lumbar spine consistent with metastatic disease. Patient was found to have enlarged abnormal prostate as well as an elevated PSA of 469 and was presumptively diagnosed with metastatic prostate cancer. He was started on Firmagon by urology. He was also seen by radiation oncology and started palliative radiation to his left hip on 11/04/2016 and finished 10 doses of palliative Rt.  He has received 2 monthly doses of firmagon so far. Last dose was on 11/23/16. He is still awaiting prostate biopsy for formal diagnosis  Interval history- patient was recently admitted to the hospital on 11/22/2016 for episodes of hallucination and weakness. He had one episode of fever at home. He is doing relatively well since his hospital discharge. No episodes of confusion. He is on ciproflox for his UTI. Reports  occasional nausea and has early satiety. Feels fatigued. Reports generalized pain but is ambulating better. Had some diarrhea with miralax. Tolerating PO iron  ECOG PS- 2 Pain scale- 4 Opioid associated constipation- no  Review of systems- Review of Systems  Constitutional: Positive for malaise/fatigue. Negative for chills, fever and weight loss.  HENT: Negative for congestion, ear discharge and nosebleeds.   Eyes: Negative for blurred vision.  Respiratory: Negative for cough, hemoptysis, sputum production, shortness of breath and wheezing.   Cardiovascular: Negative for chest pain, palpitations, orthopnea and claudication.  Gastrointestinal: Positive for nausea. Negative for abdominal pain, blood in stool, constipation, diarrhea, heartburn, melena and vomiting.  Genitourinary: Negative for dysuria, flank pain, frequency, hematuria and urgency.  Musculoskeletal: Positive for back pain. Negative for joint pain and myalgias.  Skin: Negative for rash.  Neurological: Negative for dizziness, tingling, focal weakness, seizures, weakness and headaches.  Endo/Heme/Allergies: Does not bruise/bleed easily.  Psychiatric/Behavioral: Negative for depression and suicidal ideas. The patient does not have insomnia.      Current treatment- monthly firmagon by urology  No Known Allergies   Past Medical History:  Diagnosis Date  . CAD (coronary artery disease)   . Cancer ([place])   . Hypertension   . RAD (reactive airway disease)      Past Surgical History:  Procedure Laterality Date  . CORONARY ANGIOPLASTY WITH STENT PLACEMENT    . IR GENERIC HISTORICAL  10/17/2016   IR NEPHROSTOMY PLACEMENT RIGHT 10/17/2016 [facility]INTERV RAD  . IR GENERIC HISTORICAL  10/17/2016   IR NEPHROSTOMY PLACEMENT LEFT 10/17/2016 [name], MD [facility]INTERV RAD  . KNEE SURGERY Left 2006   Laparascopy done on left knee, scar tissue taken out  . nephrostomy tubes Bilateral  Social History   Social History  .  Marital status: Married    Spouse name: N/A  . Number of children: N/A  . Years of education: N/A   Occupational History  . Not on file.   Social History Main Topics  . Smoking status: Former Smoker    Packs/day: 1.00    Years: 69.50    Quit date: 11/04/2014  . Smokeless tobacco: Never Used  . Alcohol use No  . Drug use: No  . Sexual activity: Yes   Other Topics Concern  . Not on file   Social History Narrative  . No narrative on file    Family History  Problem Relation Age of Onset  . Hypertension Other   . Lung cancer Brother      Current Outpatient Prescriptions:  .  ciprofloxacin (CIPRO) 500 MG tablet, Take 1 tablet (500 mg total) by mouth 2 (two) times daily., Disp: 10 tablet, Rfl: 0 .  dexamethasone (DECADRON) 4 MG tablet, Take 1 tablet (4 mg total) by mouth 2 (two) times daily with a meal., Disp: 14 tablet, Rfl: 0 .  fentaNYL (DURAGESIC - DOSED MCG/HR) 25 MCG/HR patch, Place 1 patch (25 mcg total) onto the skin every 3 (three) days., Disp: 10 patch, Rfl: 0 .  nitroGLYCERIN (NITROSTAT) 0.4 MG SL tablet, Place 0.4 mg under the tongue every 5 (five) minutes x 3 doses as needed for chest pain., Disp: , Rfl:  .  ondansetron (ZOFRAN) 4 MG tablet, Take 1 tablet (4 mg total) by mouth every 6 (six) hours as needed for nausea., Disp: 40 tablet, Rfl: 0 .  Oxycodone HCl 10 MG TABS, Take 1 tablet (10 mg total) by mouth every 4 (four) hours as needed., Disp: 60 tablet, Rfl: 0 .  polyethylene glycol (MIRALAX / GLYCOLAX) packet, Take 17 g by mouth daily., Disp: , Rfl:  .  senna-docusate (SENOKOT-S) 8.6-50 MG tablet, Take 2 tablets by mouth 2 (two) times daily., Disp: 30 tablet, Rfl: 0 .  SPS 15 GM/60ML suspension, Take 60 mLs by mouth daily., Disp: , Rfl:   Physical exam:  Vitals:   11/25/16 1141  BP: 111/73  Pulse: (!) 120  Resp: 18  Temp: 97.4 F (36.3 C)  TempSrc: Tympanic  Weight: 171 lb 4.8 oz (77.7 kg)  Height: 5\' 8"  (1.727 m)   Physical Exam  Constitutional: He is  oriented to person, place, and time and well-developed, well-nourished, and in no distress.  He is ambulating with a cane  HENT:  Head: Normocephalic and atraumatic.  Eyes: EOM are normal. Pupils are equal, round, and reactive to light.  Neck: Normal range of motion.  Cardiovascular: Normal rate, regular rhythm and normal heart sounds.   Pulmonary/Chest: Effort normal and breath sounds normal.  Abdominal: Soft. Bowel sounds are normal.  Neurological: He is alert and oriented to person, place, and time.  Skin: Skin is warm and dry.     CMP Latest Ref Rng & Units 11/25/2016  Glucose 65 - 99 mg/dL 108(H)  BUN 6 - 20 mg/dL 46(H)  Creatinine 0.61 - 1.24 mg/dL 2.44(H)  Sodium 135 - 145 mmol/L 133(L)  Potassium 3.5 - 5.1 mmol/L 4.2  Chloride 101 - 111 mmol/L 104  CO2 22 - 32 mmol/L 20(L)  Calcium 8.9 - 10.3 mg/dL 8.0(L)  Total Protein 6.5 - 8.1 g/dL 6.0(L)  Total Bilirubin 0.3 - 1.2 mg/dL 0.3  Alkaline Phos 38 - 126 U/L 173(H)  AST 15 - 41 U/L 14(L)  ALT 17 -  63 U/L 14(L)   CBC Latest Ref Rng & Units 11/25/2016  WBC 3.8 - 10.6 K/uL 7.0  Hemoglobin 13.0 - 18.0 g/dL 9.5(L)  Hematocrit 40.0 - 52.0 % 28.6(L)  Platelets 150 - 440 K/uL 258    No images are attached to the encounter.  Dg Chest 2 View  Result Date: 11/20/2016 CLINICAL DATA:  Mental status changes and hallucinations. Hypotension. History of metastatic prostate carcinoma. EXAM: CHEST  2 VIEW COMPARISON:  11/06/2012 FINDINGS: Stable ectasia and prominence of the thoracic aorta. No edema, airspace consolidation, nodule or pleural fluid identified. Bilateral nephrostomy tubes visible. Multiple sclerotic lesions are seen involving ribs and vertebral bodies consistent with metastatic prostate carcinoma. IMPRESSION: No acute pulmonary process identified. Skeletal metastatic disease is present consistent with known metastatic prostate carcinoma. Electronically Signed   By: [name] M.D.   On: 11/20/2016 15:07   Dg Abdomen 1  View  Result Date: 11/20/2016 CLINICAL DATA:  Bilateral nephrostomy EXAM: ABDOMEN - 1 VIEW COMPARISON:  CT scan 10/16/2016 FINDINGS: There is normal small bowel gas pattern. Some colonic stool noted in right colon and descending colon. Bilateral nephrostomy catheters are partially visualized. Mild dextroscoliosis and degenerative changes lumbar spine. IMPRESSION: Bilateral nephrostomy catheters are partially visualized. Normal small bowel gas pattern. Electronically Signed   By: Lahoma Crocker M.D.   On: 11/20/2016 17:09   Ct Head Wo Contrast  Result Date: 11/20/2016 CLINICAL DATA:  Altered mental status, hallucinations EXAM: CT HEAD WITHOUT CONTRAST TECHNIQUE: Contiguous axial images were obtained from the base of the skull through the vertex without intravenous contrast. COMPARISON:  None. FINDINGS: Brain: No intracranial hemorrhage, mass effect or midline shift. No acute cortical infarction. Mild cerebral atrophy. Mild periventricular chronic white matter disease. No mass lesion is noted on this unenhanced scan. Vascular: Atherosclerotic calcifications of carotid siphon. Skull: No skull fracture is noted. Sinuses/Orbits: No acute findings Other: None IMPRESSION: No acute intracranial abnormality. Mild cerebral atrophy. No acute cortical infarction. Electronically Signed   By: Lahoma Crocker M.D.   On: 11/20/2016 15:09   Ct Chest Wo Contrast  Result Date: 11/11/2016 CLINICAL DATA:  New diagnosis of presumed metastatic prostate cancer. Staging workup. EXAM: CT CHEST WITHOUT CONTRAST TECHNIQUE: Multidetector CT imaging of the chest was performed following the standard protocol without IV contrast. COMPARISON:  Abdomen and pelvis CT 10/16/2016 FINDINGS: Cardiovascular: The heart size is normal. No pericardial effusion. Coronary artery calcification is noted. Atherosclerotic calcification is noted in the wall of the thoracic aorta. Mediastinum/Nodes: 15 mm short axis right paratracheal lymph node identified. 10 mm  short axis subcarinal lymph node is evident. No evidence for gross hilar lymphadenopathy although assessment is limited by the lack of intravenous contrast on today's study. The esophagus has normal imaging features. There is no axillary lymphadenopathy. Lungs/Pleura: Biapical pleural-parenchymal scarring is noted. Centrilobular emphysema noted bilaterally. Bronchial wall thickening is evident. Calcified granuloma identified right apex. 4 mm perifissural nodule seen on the minor fissure image 85 series 3, likely benign. Subsegmental atelectasis is present within the posterior right lower lobe. No focal airspace consolidation. No pulmonary edema or pleural effusion. Upper Abdomen: Visualized portion of the liver are suggests a subtle nodularity of the contour. Bilateral percutaneous nephrostomy tubes are evident. 12 mm hyperattenuating lesion is identified in the upper pole of the left kidney, similar to previous atherosclerotic calcification noted in the wall of the abdominal aorta Musculoskeletal: Widespread sclerotic metastases are identified within the visualized bony anatomy. IMPRESSION: 1. Enlarged right paratracheal lymph node may reflect  metastatic disease. 2. Emphysema without definite pulmonary parenchymal metastatic involvement. 3. Widespread sclerotic bone lesions compatible with metastatic involvement. 4. Liver demonstrates subtly nodular contour raising the question of, but not diagnostic for cirrhosis. Electronically Signed   By: Misty Stanley M.D.   On: 11/11/2016 15:20   Mr Brain Wo Contrast  Result Date: 11/22/2016 CLINICAL DATA:  Diffuse pain, nausea, weakness and hypotensive. Confusion. History of prostate cancer, renal failure. EXAM: MRI HEAD WITHOUT CONTRAST TECHNIQUE: Multiplanar, multiecho pulse sequences of the brain and surrounding structures were obtained without intravenous contrast. Coronal T2 and axial T1 not obtained. Patient was unable to tolerate further imaging and contrast not  administered. COMPARISON:  CT HEAD November 20, 2016 at 1459 hours FINDINGS: Multiple sequences are moderately or severely motion degraded. BRAIN: No reduced diffusion to suggest acute ischemia. No susceptibility artifact to suggest hemorrhage or hypercellular tumor ventricles and sulci are normal for patient's age. Minimal white matter changes most compatible with chronic small vessel ischemic disease, less than expected for age. No suspicious parenchymal signal, masses or mass effect. No abnormal extra-axial fluid collections. No extra-axial masses though, contrast enhanced sequences would be more sensitive. VASCULAR: Normal major intracranial vascular flow voids present at skull base. SKULL AND UPPER CERVICAL SPINE: No abnormal sellar expansion. Low signal within these C3 and C4 vertebral bodies seen on sagittal T1. Craniocervical junction maintained. SINUSES/ORBITS: The mastoid air-cells and included paranasal sinuses are well-aerated. The included ocular globes and orbital contents are non-suspicious. OTHER: None. IMPRESSION: Negative motion degraded limited MRI of the head. Low signal within C3 and C4 vertebral bodies concerning for osseous metastasis in the setting of known prostate cancer. Electronically Signed   By: Elon Alas M.D.   On: 11/22/2016 16:34     Assessment and plan- Patient is a 82 y.o. male with a history of Presumed diagnosis of castration sensitive metastatic prostate cancer with metastases to the bone and lymph nodes  1. Anemia of chronic kidney disease and possible element of iron deficiency - patient will continue to take oral iron. If he is unable to tolerate oral iron and then we will switch him to IV iron. Given that patient is significantly anemic we will proceed with giving him aranesp shots at 0.45 mcg/kg Q 4 weeks starting today. I discussed the risks and benefits of illness including all but not limited to hypertension risk of strokes which is mainly seen within the  hemoglobin goes beyond 11. We will plan to keep his hemoglobin between 10-11. There is also a possibility of progression of underlying cancer which can be seen with aranesp but this is not typically a concern in metastatic setting. Patient understands and agrees to proceed with it  2.  Tachycardia- likely because metoprolol was stopped. We will touch base with his cardiologist about restarting low dose. Will give 500 cc fluids today  3. Prostate cancer- he will get biopsy next week and I will see him back in 2 weeks to discuss results and possibly adding Zytiga  4. Neoplasm related pain- continue fentanyl patch and prn oxycodone  5. Diarrhea- he will continue senna and miralax but hold if he has loose stools/ diarrhea   Total face to face encounter time for this patient visit was 20 min. >50% of the time was  spent in counseling and coordination of care.   Visit Diagnosis 1. Prostate cancer metastatic to bone ([place])   2. Anemia of chronic renal failure, unspecified CKD stage   3. Neoplasm related pain  4. Tachycardia      Dr. Randa Evens, MD, MPH [facility] at [facility] Pager- [phone] 11/25/2016 8:56 AM

## 2016-11-30 ENCOUNTER — Other Ambulatory Visit: Payer: Self-pay | Admitting: Radiation Oncology

## 2016-11-30 ENCOUNTER — Ambulatory Visit: Payer: Medicare Other | Admitting: Urology

## 2016-11-30 ENCOUNTER — Encounter: Payer: Self-pay | Admitting: Urology

## 2016-11-30 ENCOUNTER — Other Ambulatory Visit: Payer: Self-pay | Admitting: Urology

## 2016-11-30 VITALS — BP 117/75 | HR 111 | Ht 68.0 in | Wt 172.1 lb

## 2016-11-30 DIAGNOSIS — C61 Malignant neoplasm of prostate: Secondary | ICD-10-CM

## 2016-11-30 MED ORDER — LEVOFLOXACIN 500 MG PO TABS
500.0000 mg | ORAL_TABLET | Freq: Once | ORAL | Status: AC
  Administered 2016-11-30: 500 mg via ORAL

## 2016-11-30 MED ORDER — GENTAMICIN SULFATE 40 MG/ML IJ SOLN
80.0000 mg | Freq: Once | INTRAMUSCULAR | Status: AC
  Administered 2016-11-30: 80 mg via INTRAMUSCULAR

## 2016-11-30 NOTE — Progress Notes (Signed)
Prostate Biopsy Procedure   Informed consent was obtained after discussing risks/benefits of the procedure.  A time out was performed to ensure correct patient identity.  Pre-Procedure: - Last PSA Level:  Lab Results  Component Value Date   PSA 2.52 11/25/2016   PSA 1.98 11/22/2016   PSA 469.00 (H) 10/17/2016   - Gentamicin given prophylactically - Levaquin 500 mg administered PO -Transrectal Ultrasound performed revealing a 30 gm prostate -Large hypoechoic area involving the entire right lobe.  Procedure: - Prostate block performed using 10 cc 1% lidocaine and biopsies taken from sextant areas, a total of 4 under ultrasound guidance.  Post-Procedure: - Patient tolerated the procedure well - He was counseled to seek immediate medical attention if experiences any severe pain, significant bleeding, or fevers   PLAN: F/u With Dr. [name] for further treatment of the prostate cancer. I would recommend that he be given his injections of Lupron or androgen deprivation therapy concomitantly in oncology to avoid unnecessary trips to urology.  We will plan to manage his nephrostomy tubes, they are due to be exchanged in 2 weeks. We will discuss a capping trial in 6 weeks.   The patient will follow-up with Dr. Erlene Quan.

## 2016-11-30 NOTE — Addendum Note (Signed)
Addended by: Wilson Singer on: 11/30/2016 12:20 PM   Modules accepted: Orders

## 2016-12-01 ENCOUNTER — Other Ambulatory Visit: Payer: Self-pay | Admitting: *Deleted

## 2016-12-01 MED ORDER — ONDANSETRON HCL 4 MG PO TABS: 4.0000 mg | ORAL_TABLET | Freq: Four times a day (QID) | ORAL | 1 refills | Status: DC | PRN

## 2016-12-01 NOTE — Telephone Encounter (Signed)
Thad called on my cell phone to see if nausea med. Could be refilled and it was sent to [facility]

## 2016-12-02 ENCOUNTER — Ambulatory Visit: Payer: Medicare Other

## 2016-12-07 ENCOUNTER — Inpatient Hospital Stay: Payer: Medicare Other | Admitting: Oncology

## 2016-12-07 ENCOUNTER — Other Ambulatory Visit: Payer: Self-pay | Admitting: Urology

## 2016-12-07 ENCOUNTER — Other Ambulatory Visit: Payer: Medicare Other

## 2016-12-07 ENCOUNTER — Inpatient Hospital Stay: Payer: Medicare Other

## 2016-12-07 LAB — PATHOLOGY REPORT

## 2016-12-09 ENCOUNTER — Other Ambulatory Visit: Payer: Medicare Other

## 2016-12-10 ENCOUNTER — Inpatient Hospital Stay: Payer: Medicare Other

## 2016-12-10 ENCOUNTER — Other Ambulatory Visit: Payer: Self-pay | Admitting: Radiology

## 2016-12-10 ENCOUNTER — Inpatient Hospital Stay: Payer: Medicare Other | Attending: Oncology | Admitting: Oncology

## 2016-12-10 VITALS — BP 118/73 | HR 108 | Temp 99.0°F | Ht 68.0 in | Wt 170.1 lb

## 2016-12-10 DIAGNOSIS — Z923 Personal history of irradiation: Secondary | ICD-10-CM | POA: Insufficient documentation

## 2016-12-10 DIAGNOSIS — J439 Emphysema, unspecified: Secondary | ICD-10-CM | POA: Diagnosis not present

## 2016-12-10 DIAGNOSIS — G893 Neoplasm related pain (acute) (chronic): Secondary | ICD-10-CM | POA: Diagnosis not present

## 2016-12-10 DIAGNOSIS — C61 Malignant neoplasm of prostate: Secondary | ICD-10-CM

## 2016-12-10 DIAGNOSIS — Z801 Family history of malignant neoplasm of trachea, bronchus and lung: Secondary | ICD-10-CM | POA: Insufficient documentation

## 2016-12-10 DIAGNOSIS — Z87891 Personal history of nicotine dependence: Secondary | ICD-10-CM

## 2016-12-10 DIAGNOSIS — I129 Hypertensive chronic kidney disease with stage 1 through stage 4 chronic kidney disease, or unspecified chronic kidney disease: Secondary | ICD-10-CM | POA: Diagnosis not present

## 2016-12-10 DIAGNOSIS — N1330 Unspecified hydronephrosis: Secondary | ICD-10-CM | POA: Diagnosis not present

## 2016-12-10 DIAGNOSIS — G319 Degenerative disease of nervous system, unspecified: Secondary | ICD-10-CM | POA: Diagnosis not present

## 2016-12-10 DIAGNOSIS — C7951 Secondary malignant neoplasm of bone: Secondary | ICD-10-CM | POA: Diagnosis not present

## 2016-12-10 DIAGNOSIS — R102 Pelvic and perineal pain: Secondary | ICD-10-CM | POA: Diagnosis not present

## 2016-12-10 DIAGNOSIS — Z79899 Other long term (current) drug therapy: Secondary | ICD-10-CM | POA: Diagnosis not present

## 2016-12-10 DIAGNOSIS — R000 Tachycardia, unspecified: Secondary | ICD-10-CM | POA: Diagnosis not present

## 2016-12-10 DIAGNOSIS — N3289 Other specified disorders of bladder: Secondary | ICD-10-CM

## 2016-12-10 DIAGNOSIS — N189 Chronic kidney disease, unspecified: Secondary | ICD-10-CM | POA: Diagnosis not present

## 2016-12-10 DIAGNOSIS — J45909 Unspecified asthma, uncomplicated: Secondary | ICD-10-CM | POA: Diagnosis not present

## 2016-12-10 DIAGNOSIS — D509 Iron deficiency anemia, unspecified: Secondary | ICD-10-CM | POA: Insufficient documentation

## 2016-12-10 DIAGNOSIS — C779 Secondary and unspecified malignant neoplasm of lymph node, unspecified: Secondary | ICD-10-CM | POA: Diagnosis not present

## 2016-12-10 DIAGNOSIS — Z7189 Other specified counseling: Secondary | ICD-10-CM

## 2016-12-10 DIAGNOSIS — I2510 Atherosclerotic heart disease of native coronary artery without angina pectoris: Secondary | ICD-10-CM | POA: Insufficient documentation

## 2016-12-10 DIAGNOSIS — D631 Anemia in chronic kidney disease: Secondary | ICD-10-CM

## 2016-12-10 LAB — CBC WITH DIFFERENTIAL/PLATELET
BASOS PCT: 1 %
Basophils Absolute: 0 10*3/uL (ref 0–0.1)
Eosinophils Absolute: 0.1 10*3/uL (ref 0–0.7)
Eosinophils Relative: 3 %
HEMATOCRIT: 25 % — AB (ref 40.0–52.0)
HEMOGLOBIN: 8.5 g/dL — AB (ref 13.0–18.0)
LYMPHS ABS: 0.9 10*3/uL — AB (ref 1.0–3.6)
Lymphocytes Relative: 18 %
MCH: 30 pg (ref 26.0–34.0)
MCHC: 33.9 g/dL (ref 32.0–36.0)
MCV: 88.4 fL (ref 80.0–100.0)
MONO ABS: 0.5 10*3/uL (ref 0.2–1.0)
MONOS PCT: 11 %
NEUTROS ABS: 3.3 10*3/uL (ref 1.4–6.5)
NEUTROS PCT: 67 %
Platelets: 526 10*3/uL — ABNORMAL HIGH (ref 150–440)
RBC: 2.83 MIL/uL — ABNORMAL LOW (ref 4.40–5.90)
RDW: 15 % — AB (ref 11.5–14.5)
WBC: 4.8 10*3/uL (ref 3.8–10.6)

## 2016-12-10 LAB — COMPREHENSIVE METABOLIC PANEL
ALK PHOS: 197 U/L — AB (ref 38–126)
ALT: 12 U/L — ABNORMAL LOW (ref 17–63)
ANION GAP: 5 (ref 5–15)
AST: 18 U/L (ref 15–41)
Albumin: 3 g/dL — ABNORMAL LOW (ref 3.5–5.0)
BILIRUBIN TOTAL: 0.3 mg/dL (ref 0.3–1.2)
BUN: 19 mg/dL (ref 6–20)
CALCIUM: 8.2 mg/dL — AB (ref 8.9–10.3)
CO2: 30 mmol/L (ref 22–32)
Chloride: 99 mmol/L — ABNORMAL LOW (ref 101–111)
Creatinine, Ser: 2.29 mg/dL — ABNORMAL HIGH (ref 0.61–1.24)
GFR, EST AFRICAN AMERICAN: 30 mL/min — AB (ref >60)
GFR, EST NON AFRICAN AMERICAN: 25 mL/min — AB (ref >60)
Glucose, Bld: 120 mg/dL — ABNORMAL HIGH (ref 65–99)
POTASSIUM: 4.6 mmol/L (ref 3.5–5.1)
Sodium: 134 mmol/L — ABNORMAL LOW (ref 135–145)
TOTAL PROTEIN: 6.7 g/dL (ref 6.5–8.1)

## 2016-12-10 MED ORDER — OXYCODONE HCL 10 MG PO TABS: 10.0000 mg | ORAL_TABLET | ORAL | 0 refills | Status: DC | PRN

## 2016-12-10 NOTE — Progress Notes (Signed)
Hematology/Oncology Consult note [facility]  [phone] [phone]  Patient Care Team: No Pcp Per Patient as PCP - General (General Practice)   Name of the patient: Johnathan Gould  [name]  12-Oct-1935   Date of visit: 12/10/16  Diagnosis- 1. 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 Anemia likely from CKD and IDA  Chief complaint/ Reason for visit- routine f/u  Heme/Onc history: patient is a 82 year old male was recently admitted to the hospital on 10/16/2016 with symptoms of left hip pain and renal failure. He was found to have bilateral hydronephrosis and is status post bilateral nephrostomy tube placement. CT abdomen showed an irregular polypoid mass at the posterior aspect of the bladder highly concerning for bladder carcinoma. Extensive bulky adenopathy throughout the abdomen and pelvis consistent with nodal metastases. Widespread osseous metastatic disease. Bone scan showed multifocal areas of increased activity throughout the pelvis, bilateral femurs, rib cage, right scapula, thoracic cervical and lumbar spine consistent with metastatic disease. Patient was found to have enlarged abnormal prostate as well as an elevated PSA of 469 and was presumptively diagnosed with metastatic prostate cancer. He was started on Firmagon by urology. He was also seen by radiation oncology and started palliative radiation to his left hip on 11/04/2016 and finished 10 doses of palliative Rt.  He has received 2 monthly doses of firmagon so far. Last dose was on 11/23/16.  4 core biopsy obtained for tissue diagnosis which showed: A. PROSTATIC ADENOCARCINOMA. GLEASON'S SCORE 10 (GRADES 5 + 5) NOTED IN 2  OUT OF 2 SUBMITTED PROSTATE CORE SEGMENTS. APPROXIMATELY 100% OF SUBMITTED  TISSUE INVOLVED. (GRADE GROUP 5)  B. PROSTATIC ADENOCARCINOMA. GLEASON'S SCORE 9 (GRADES 5 + 4) NOTED IN 1  OUT OF 1 SUBMITTED PROSTATE CORE  SEGMENTS. APPROXIMATELY 100% OF SUBMITTED  TISSUE INVOLVED. (GRADE GROUP 5)  C. PROSTATIC ADENOCARCINOMA. GLEASON'S SCORE 10 (GRADES 5 + 5) NOTED IN 1  OUT OF 1 SUBMITTED PROSTATE CORE SEGMENTS. APPROXIMATELY 100% OF SUBMITTED  TISSUE INVOLVED. (GRADE GROUP 5)  D. PROSTATIC ADENOCARCINOMA. GLEASON'S SCORE 10 (GRADES 5 + 5) NOTED IN 1  OUT OF 1 SUBMITTED PROSTATE CORE SEGMENTS. APPROXIMATELY 100% OF SUBMITTED  TISSUE INVOLVED. (GRADE GROUP 5)   Interval history- continues to have fatigue. Mainly reports discomfort over b/l nephrostomy tubes. Does have pelvic pain which is well controlled with pain meds  ECOG PS- 2 Pain scale- 3 Opioid associated constipation- no  Review of systems- Review of Systems  Constitutional: Negative for chills, fever, malaise/fatigue and weight loss.  HENT: Negative for congestion, ear discharge and nosebleeds.   Eyes: Negative for blurred vision.  Respiratory: Negative for cough, hemoptysis, sputum production, shortness of breath and wheezing.   Cardiovascular: Negative for chest pain, palpitations, orthopnea and claudication.  Gastrointestinal: Negative for abdominal pain, blood in stool, constipation, diarrhea, heartburn, melena, nausea and vomiting.  Genitourinary: Negative for dysuria, flank pain, frequency, hematuria and urgency.  Musculoskeletal: Negative for back pain, joint pain and myalgias.  Skin: Negative for rash.  Neurological: Negative for dizziness, tingling, focal weakness, seizures, weakness and headaches.  Endo/Heme/Allergies: Does not bruise/bleed easily.  Psychiatric/Behavioral: Negative for depression and suicidal ideas. The patient does not have insomnia.      Current treatment- yet to start  No Known Allergies   Past Medical History:  Diagnosis Date  . CAD (coronary artery disease)   . Cancer ([place])   . Hypertension   . RAD (reactive airway disease)  Past Surgical History:  Procedure Laterality Date  . CORONARY  ANGIOPLASTY WITH STENT PLACEMENT    . IR GENERIC HISTORICAL  10/17/2016   IR NEPHROSTOMY PLACEMENT RIGHT 10/17/2016 [facility]-INTERV RAD  . IR GENERIC HISTORICAL  10/17/2016   IR NEPHROSTOMY PLACEMENT LEFT 10/17/2016 [name], MD [facility]-INTERV RAD  . KNEE SURGERY Left 2006   Laparascopy done on left knee, scar tissue taken out  . nephrostomy tubes Bilateral     Social History   Social History  . Marital status: Married    Spouse name: N/A  . Number of children: N/A  . Years of education: N/A   Occupational History  . Not on file.   Social History Main Topics  . Smoking status: Former Smoker    Packs/day: 1.00    Years: 69.50    Quit date: 11/04/2014  . Smokeless tobacco: Never Used  . Alcohol use No  . Drug use: No  . Sexual activity: Yes   Other Topics Concern  . Not on file   Social History Narrative  . No narrative on file    Family History  Problem Relation Age of Onset  . Hypertension Other   . Lung cancer Brother      Current Outpatient Prescriptions:  .  ciprofloxacin (CIPRO) 500 MG tablet, Take 1 tablet (500 mg total) by mouth 2 (two) times daily., Disp: 10 tablet, Rfl: 0 .  dexamethasone (DECADRON) 4 MG tablet, Take 1 tablet (4 mg total) by mouth 2 (two) times daily with a meal., Disp: 14 tablet, Rfl: 0 .  fentaNYL (DURAGESIC - DOSED MCG/HR) 25 MCG/HR patch, Place 1 patch (25 mcg total) onto the skin every 3 (three) days., Disp: 10 patch, Rfl: 0 .  nitroGLYCERIN (NITROSTAT) 0.4 MG SL tablet, Place 0.4 mg under the tongue every 5 (five) minutes x 3 doses as needed for chest pain., Disp: , Rfl:  .  ondansetron (ZOFRAN) 4 MG tablet, Take 1 tablet (4 mg total) by mouth every 6 (six) hours as needed for nausea., Disp: 40 tablet, Rfl: 1 .  Oxycodone HCl 10 MG TABS, Take 1 tablet (10 mg total) by mouth every 4 (four) hours as needed., Disp: 60 tablet, Rfl: 0 .  polyethylene glycol (MIRALAX / GLYCOLAX) packet, Take 17 g by mouth daily., Disp: , Rfl:  .   senna-docusate (SENOKOT-S) 8.6-50 MG tablet, Take 2 tablets by mouth 2 (two) times daily., Disp: 30 tablet, Rfl: 0 .  SPS 15 GM/60ML suspension, Take 60 mLs by mouth daily., Disp: , Rfl:   Physical exam:  Vitals:   12/10/16 1438  BP: 118/73  Pulse: (!) 108  Temp: 99 F (37.2 C)  TempSrc: Oral  Weight: 170 lb 1.4 oz (77.2 kg)  Height: 5\' 8"  (1.727 m)   Physical Exam  Constitutional: He is oriented to person, place, and time and well-developed, well-nourished, and in no distress.  HENT:  Head: Normocephalic and atraumatic.  Eyes: EOM are normal. Pupils are equal, round, and reactive to light.  Neck: Normal range of motion.  Cardiovascular: Normal rate, regular rhythm and normal heart sounds.   Pulmonary/Chest: Effort normal and breath sounds normal.  Abdominal: Soft. Bowel sounds are normal.  B/l nephrostomy tubes in place  Neurological: He is alert and oriented to person, place, and time.  Skin: Skin is warm and dry.     CMP Latest Ref Rng & Units 11/25/2016  Glucose 65 - 99 mg/dL 108(H)  BUN 6 - 20 mg/dL 46(H)  Creatinine 0.61 -  1.24 mg/dL 2.44(H)  Sodium 135 - 145 mmol/L 133(L)  Potassium 3.5 - 5.1 mmol/L 4.2  Chloride 101 - 111 mmol/L 104  CO2 22 - 32 mmol/L 20(L)  Calcium 8.9 - 10.3 mg/dL 8.0(L)  Total Protein 6.5 - 8.1 g/dL 6.0(L)  Total Bilirubin 0.3 - 1.2 mg/dL 0.3  Alkaline Phos 38 - 126 U/L 173(H)  AST 15 - 41 U/L 14(L)  ALT 17 - 63 U/L 14(L)   CBC Latest Ref Rng & Units 11/25/2016  WBC 3.8 - 10.6 K/uL 7.0  Hemoglobin 13.0 - 18.0 g/dL 9.5(L)  Hematocrit 40.0 - 52.0 % 28.6(L)  Platelets 150 - 440 K/uL 258   PSA on 11/25/2016 was 2.52 down from 469   No images are attached to the encounter.  Dg Chest 2 View  Result Date: 11/20/2016 CLINICAL DATA:  Mental status changes and hallucinations. Hypotension. History of metastatic prostate carcinoma. EXAM: CHEST  2 VIEW COMPARISON:  11/06/2012 FINDINGS: Stable ectasia and prominence of the thoracic aorta. No edema,  airspace consolidation, nodule or pleural fluid identified. Bilateral nephrostomy tubes visible. Multiple sclerotic lesions are seen involving ribs and vertebral bodies consistent with metastatic prostate carcinoma. IMPRESSION: No acute pulmonary process identified. Skeletal metastatic disease is present consistent with known metastatic prostate carcinoma. Electronically Signed   By: [name] M.D.   On: 11/20/2016 15:07   Dg Abdomen 1 View  Result Date: 11/20/2016 CLINICAL DATA:  Bilateral nephrostomy EXAM: ABDOMEN - 1 VIEW COMPARISON:  CT scan 10/16/2016 FINDINGS: There is normal small bowel gas pattern. Some colonic stool noted in right colon and descending colon. Bilateral nephrostomy catheters are partially visualized. Mild dextroscoliosis and degenerative changes lumbar spine. IMPRESSION: Bilateral nephrostomy catheters are partially visualized. Normal small bowel gas pattern. Electronically Signed   By: Lahoma Crocker M.D.   On: 11/20/2016 17:09   Ct Head Wo Contrast  Result Date: 11/20/2016 CLINICAL DATA:  Altered mental status, hallucinations EXAM: CT HEAD WITHOUT CONTRAST TECHNIQUE: Contiguous axial images were obtained from the base of the skull through the vertex without intravenous contrast. COMPARISON:  None. FINDINGS: Brain: No intracranial hemorrhage, mass effect or midline shift. No acute cortical infarction. Mild cerebral atrophy. Mild periventricular chronic white matter disease. No mass lesion is noted on this unenhanced scan. Vascular: Atherosclerotic calcifications of carotid siphon. Skull: No skull fracture is noted. Sinuses/Orbits: No acute findings Other: None IMPRESSION: No acute intracranial abnormality. Mild cerebral atrophy. No acute cortical infarction. Electronically Signed   By: Lahoma Crocker M.D.   On: 11/20/2016 15:09   Ct Chest Wo Contrast  Result Date: 11/11/2016 CLINICAL DATA:  New diagnosis of presumed metastatic prostate cancer. Staging workup. EXAM: CT CHEST WITHOUT  CONTRAST TECHNIQUE: Multidetector CT imaging of the chest was performed following the standard protocol without IV contrast. COMPARISON:  Abdomen and pelvis CT 10/16/2016 FINDINGS: Cardiovascular: The heart size is normal. No pericardial effusion. Coronary artery calcification is noted. Atherosclerotic calcification is noted in the wall of the thoracic aorta. Mediastinum/Nodes: 15 mm short axis right paratracheal lymph node identified. 10 mm short axis subcarinal lymph node is evident. No evidence for gross hilar lymphadenopathy although assessment is limited by the lack of intravenous contrast on today's study. The esophagus has normal imaging features. There is no axillary lymphadenopathy. Lungs/Pleura: Biapical pleural-parenchymal scarring is noted. Centrilobular emphysema noted bilaterally. Bronchial wall thickening is evident. Calcified granuloma identified right apex. 4 mm perifissural nodule seen on the minor fissure image 85 series 3, likely benign. Subsegmental atelectasis is present within the  posterior right lower lobe. No focal airspace consolidation. No pulmonary edema or pleural effusion. Upper Abdomen: Visualized portion of the liver are suggests a subtle nodularity of the contour. Bilateral percutaneous nephrostomy tubes are evident. 12 mm hyperattenuating lesion is identified in the upper pole of the left kidney, similar to previous atherosclerotic calcification noted in the wall of the abdominal aorta Musculoskeletal: Widespread sclerotic metastases are identified within the visualized bony anatomy. IMPRESSION: 1. Enlarged right paratracheal lymph node may reflect metastatic disease. 2. Emphysema without definite pulmonary parenchymal metastatic involvement. 3. Widespread sclerotic bone lesions compatible with metastatic involvement. 4. Liver demonstrates subtly nodular contour raising the question of, but not diagnostic for cirrhosis. Electronically Signed   By: Misty Stanley M.D.   On: 11/11/2016  15:20   Mr Brain Wo Contrast  Result Date: 11/22/2016 CLINICAL DATA:  Diffuse pain, nausea, weakness and hypotensive. Confusion. History of prostate cancer, renal failure. EXAM: MRI HEAD WITHOUT CONTRAST TECHNIQUE: Multiplanar, multiecho pulse sequences of the brain and surrounding structures were obtained without intravenous contrast. Coronal T2 and axial T1 not obtained. Patient was unable to tolerate further imaging and contrast not administered. COMPARISON:  CT HEAD November 20, 2016 at 1459 hours FINDINGS: Multiple sequences are moderately or severely motion degraded. BRAIN: No reduced diffusion to suggest acute ischemia. No susceptibility artifact to suggest hemorrhage or hypercellular tumor ventricles and sulci are normal for patient's age. Minimal white matter changes most compatible with chronic small vessel ischemic disease, less than expected for age. No suspicious parenchymal signal, masses or mass effect. No abnormal extra-axial fluid collections. No extra-axial masses though, contrast enhanced sequences would be more sensitive. VASCULAR: Normal major intracranial vascular flow voids present at skull base. SKULL AND UPPER CERVICAL SPINE: No abnormal sellar expansion. Low signal within these C3 and C4 vertebral bodies seen on sagittal T1. Craniocervical junction maintained. SINUSES/ORBITS: The mastoid air-cells and included paranasal sinuses are well-aerated. The included ocular globes and orbital contents are non-suspicious. OTHER: None. IMPRESSION: Negative motion degraded limited MRI of the head. Low signal within C3 and C4 vertebral bodies concerning for osseous metastasis in the setting of known prostate cancer. Electronically Signed   By: Elon Alas M.D.   On: 11/22/2016 16:34     Assessment and plan- Patient is a 82 y.o. male with high risk metastatic castrate sensitive prostate cancer with metastases to the bone and lymph nodes   1. CSPC- I discussed the results of the prostate  biopsy with the patient and family which shows that patient has high risk prostate cancer given that his Gleason score was 9 and 10 in the 4 core biopsy that was obtained. Patient is already on Firmagon. Ideally he needs further treatment with either docetaxel or Zytiga given that he has high risk prostate cancer given the Gleason score, high PSA and bulky disease. I do not think that patient will tolerate docetaxel given his performance status and ongoing symptoms. I think it would be reasonable to see how he tolerates Zytiga at this point based on LATITUDE trial. I discussed the risks and benefits of Zytiga including all but not limited to hypertension, fatigue, leg swelling, electrolyte abnormalities. Patient would like to think about his options firmagon alone versus firmagon plus zytiga and get back to me in 2 weeks. We will put the prescription in so he gets an idea of insurance coverage and co pay involved.Written information about zytiga given to patient. Palliative intent of treatment discussed with him as well. We will touch base with  urology to see if they would like [place] to take over his firmagon shots.  2. Anemia of chronic kidney disease and possible element of iron deficiency - Patient to continue oral iron. I will give him next dose of aranesp in 2 weeks. He will likely need higher dose given that he has had no response so far.  3.  Tachycardia- persists but better today  4. Neoplasm related pain- continue fentanyl patch and prn oxycodone     Visit Diagnosis 1. Prostate cancer metastatic to bone ([place])   2. Prostate cancer ([place])   3. Bladder mass   4. Neoplasm related pain   5. Goals of care, counseling/discussion   6. Anemia of chronic renal failure, unspecified CKD stage      Dr. Randa Evens, MD, MPH [facility] Pager- [phone] 12/10/2016 4:02 PM

## 2016-12-10 NOTE — Progress Notes (Signed)
Patient here today for follow up. He has pain in lower back and his side. He is here for results today.

## 2016-12-12 ENCOUNTER — Other Ambulatory Visit: Payer: Self-pay | Admitting: *Deleted

## 2016-12-12 DIAGNOSIS — C61 Malignant neoplasm of prostate: Secondary | ICD-10-CM

## 2016-12-12 DIAGNOSIS — C7951 Secondary malignant neoplasm of bone: Principal | ICD-10-CM

## 2016-12-12 MED ORDER — ABIRATERONE ACETATE 250 MG PO TABS
1000.0000 mg | ORAL_TABLET | Freq: Every day | ORAL | 0 refills | Status: DC
Start: 2016-12-12 — End: 2017-02-25

## 2016-12-12 MED ORDER — PREDNISONE 5 MG PO TABS: 5.0000 mg | ORAL_TABLET | Freq: Every day | ORAL | 0 refills | Status: DC

## 2016-12-13 ENCOUNTER — Telehealth: Payer: Self-pay | Admitting: Radiology

## 2016-12-13 NOTE — Telephone Encounter (Signed)
Notified pt's wife of bilateral nephrostomy tube exchanges scheduled 12/17/16 & to arrive at [facility] registration desk at 11:00. Advised pt to be npo after mn & have a driver. Wife voices understanding.

## 2016-12-14 ENCOUNTER — Ambulatory Visit: Payer: Medicare Other

## 2016-12-16 ENCOUNTER — Other Ambulatory Visit: Payer: Self-pay | Admitting: Oncology

## 2016-12-16 ENCOUNTER — Other Ambulatory Visit: Payer: Self-pay | Admitting: Radiology

## 2016-12-16 DIAGNOSIS — C61 Malignant neoplasm of prostate: Secondary | ICD-10-CM

## 2016-12-16 MED ORDER — DEGARELIX ACETATE 80 MG ~~LOC~~ SOLR: 80.0000 mg | Freq: Once | SUBCUTANEOUS | Status: DC

## 2016-12-17 ENCOUNTER — Ambulatory Visit: Admission: RE | Admit: 2016-12-17 | Payer: Medicare Other | Source: Ambulatory Visit

## 2016-12-17 ENCOUNTER — Other Ambulatory Visit: Payer: Self-pay | Admitting: *Deleted

## 2016-12-17 DIAGNOSIS — D509 Iron deficiency anemia, unspecified: Secondary | ICD-10-CM

## 2016-12-22 ENCOUNTER — Other Ambulatory Visit: Payer: Self-pay | Admitting: *Deleted

## 2016-12-22 ENCOUNTER — Ambulatory Visit
Admission: RE | Admit: 2016-12-22 | Discharge: 2016-12-22 | Disposition: A | Discharge status: Home / Self Care | Payer: Medicare Other | Source: Ambulatory Visit | Attending: Radiation Oncology | Admitting: Radiation Oncology

## 2016-12-22 DIAGNOSIS — C61 Malignant neoplasm of prostate: Secondary | ICD-10-CM | POA: Diagnosis not present

## 2016-12-22 DIAGNOSIS — Z923 Personal history of irradiation: Secondary | ICD-10-CM | POA: Insufficient documentation

## 2016-12-22 DIAGNOSIS — C7951 Secondary malignant neoplasm of bone: Secondary | ICD-10-CM | POA: Diagnosis not present

## 2016-12-22 NOTE — Progress Notes (Signed)
Radiation Oncology Follow up Note  Name: Johnathan Gould   Date:   12/22/2016 MRN:  [id number] DOB: May 02, 1935    This 82 y.o. male presents to the clinic today for one-month follow-up status post palliative radiation therapy to his. Left hip and pelvis including his left SI joint and L5 vertebral body  REFERRING PROVIDER: No ref. provider found  HPI: Patient is one month out from palliative radiation therapy to extensive field including L5 SI joints and left hip. He has been recently biopsied and prostate shows Gleason 10 (5+5) adenocarcinoma. He's had excellent benefit from the palliative radiation therapy with marked improvement in pain. Medical oncology is planning to start Zytiga as patient is oriented on firmagone. He continues on narcotic analgesics for pain.  COMPLICATIONS OF TREATMENT: none  FOLLOW UP COMPLIANCE: keeps appointments   PHYSICAL EXAM:  There were no vitals taken for this visit. Deep palpation of his lumbar spine does not elicit pain range of motion is a lower extremities does not elicit pain motor sensory and DTR levels are equal and symmetric in the upper lower extremities. Well-developed well-nourished patient in NAD. HEENT reveals PERLA, EOMI, discs not visualized.  Oral cavity is clear. No oral mucosal lesions are identified. Neck is clear without evidence of cervical or supraclavicular adenopathy. Lungs are clear to A&P. Cardiac examination is essentially unremarkable with regular rate and rhythm without murmur rub or thrill. Abdomen is benign with no organomegaly or masses noted. Motor sensory and DTR levels are equal and symmetric in the upper and lower extremities. Cranial nerves II through XII are grossly intact. Proprioception is intact. No peripheral adenopathy or edema is identified. No motor or sensory levels are noted. Crude visual fields are within normal range.  RADIOLOGY RESULTS: No current films for review  PLAN: At the present time he will be on the  management of medical oncology. I've asked to see him about 4-5 months for follow-up. We can always can consider Xofigo. I briefly reviewed the 6 injection protocol. This can be given in conjunction with other therapies should his pain worsen. Certainly has poor prognosis based on his Gleason 10 score. Patient and daughter know to call with any concerns.  I would like to take this opportunity to thank you for allowing me to participate in the care of your patient.Armstead Peaks., MD

## 2016-12-23 ENCOUNTER — Ambulatory Visit
Admission: RE | Admit: 2016-12-23 | Discharge: 2016-12-23 | Disposition: A | Discharge status: Home / Self Care | Payer: Medicare Other | Source: Ambulatory Visit | Attending: Urology | Admitting: Urology

## 2016-12-23 ENCOUNTER — Inpatient Hospital Stay: Payer: Medicare Other

## 2016-12-23 ENCOUNTER — Other Ambulatory Visit: Payer: Self-pay | Admitting: Urology

## 2016-12-23 ENCOUNTER — Inpatient Hospital Stay: Payer: Medicare Other | Admitting: Oncology

## 2016-12-23 DIAGNOSIS — Z436 Encounter for attention to other artificial openings of urinary tract: Secondary | ICD-10-CM | POA: Insufficient documentation

## 2016-12-23 DIAGNOSIS — N1330 Unspecified hydronephrosis: Secondary | ICD-10-CM | POA: Diagnosis not present

## 2016-12-23 DIAGNOSIS — C61 Malignant neoplasm of prostate: Secondary | ICD-10-CM

## 2016-12-23 HISTORY — PX: IR GENERIC HISTORICAL: IMG1180011

## 2016-12-23 MED ORDER — LIDOCAINE VISCOUS 2 % MT SOLN
OROMUCOSAL | Status: AC
  Filled 2016-12-23: qty 15

## 2016-12-23 MED ORDER — MIDAZOLAM HCL 2 MG/2ML IJ SOLN
INTRAMUSCULAR | Status: AC | PRN
  Administered 2016-12-23 (×2): 1 mg via INTRAVENOUS

## 2016-12-23 MED ORDER — FENTANYL CITRATE (PF) 100 MCG/2ML IJ SOLN
INTRAMUSCULAR | Status: AC | PRN
  Administered 2016-12-23: 50 ug via INTRAVENOUS

## 2016-12-23 MED ORDER — IOPAMIDOL (ISOVUE-300) INJECTION 61%
30.0000 mL | Freq: Once | INTRAVENOUS | Status: AC | PRN
  Administered 2016-12-23: 15 mL

## 2016-12-23 MED ORDER — FENTANYL CITRATE (PF) 100 MCG/2ML IJ SOLN
INTRAMUSCULAR | Status: AC
  Filled 2016-12-23: qty 2

## 2016-12-23 MED ORDER — MIDAZOLAM HCL 5 MG/5ML IJ SOLN
INTRAMUSCULAR | Status: AC
  Filled 2016-12-23: qty 5

## 2016-12-23 MED ORDER — SODIUM CHLORIDE 0.9 % IV SOLN
INTRAVENOUS | Status: AC | PRN
  Administered 2016-12-23: 500 mL/h via INTRAVENOUS

## 2016-12-23 MED ORDER — SODIUM CHLORIDE 0.9 % IV SOLN
INTRAVENOUS | Status: DC
  Administered 2016-12-23: 10:00:00 via INTRAVENOUS

## 2016-12-23 NOTE — Progress Notes (Signed)
Dr. Laurence Ferrari notified of chronic pain in right flank..  care discussed with pt. And wife

## 2016-12-23 NOTE — Procedures (Signed)
Interventional Radiology Procedure Note  Procedure: Exchange of bilateral nephrostomy tubes.   Complications: None  Estimated Blood Loss: None  Recommendations: - Return to IR in 4-6 weeks for next tube exchange.    Signed,  Criselda Peaches, MD

## 2016-12-30 ENCOUNTER — Inpatient Hospital Stay: Payer: Medicare Other

## 2016-12-30 ENCOUNTER — Inpatient Hospital Stay: Payer: Medicare Other | Attending: Oncology | Admitting: Oncology

## 2016-12-30 VITALS — BP 122/76 | HR 69 | Temp 98.6°F | Resp 18 | Ht 68.0 in | Wt 165.8 lb

## 2016-12-30 DIAGNOSIS — I10 Essential (primary) hypertension: Secondary | ICD-10-CM

## 2016-12-30 DIAGNOSIS — Z79899 Other long term (current) drug therapy: Secondary | ICD-10-CM

## 2016-12-30 DIAGNOSIS — I129 Hypertensive chronic kidney disease with stage 1 through stage 4 chronic kidney disease, or unspecified chronic kidney disease: Secondary | ICD-10-CM | POA: Diagnosis not present

## 2016-12-30 DIAGNOSIS — Z800 Family history of malignant neoplasm of digestive organs: Secondary | ICD-10-CM

## 2016-12-30 DIAGNOSIS — C61 Malignant neoplasm of prostate: Secondary | ICD-10-CM

## 2016-12-30 DIAGNOSIS — J45909 Unspecified asthma, uncomplicated: Secondary | ICD-10-CM

## 2016-12-30 DIAGNOSIS — Z5111 Encounter for antineoplastic chemotherapy: Secondary | ICD-10-CM | POA: Insufficient documentation

## 2016-12-30 DIAGNOSIS — N189 Chronic kidney disease, unspecified: Principal | ICD-10-CM

## 2016-12-30 DIAGNOSIS — C7951 Secondary malignant neoplasm of bone: Secondary | ICD-10-CM

## 2016-12-30 DIAGNOSIS — D631 Anemia in chronic kidney disease: Secondary | ICD-10-CM

## 2016-12-30 DIAGNOSIS — N135 Crossing vessel and stricture of ureter without hydronephrosis: Secondary | ICD-10-CM | POA: Diagnosis not present

## 2016-12-30 DIAGNOSIS — I2510 Atherosclerotic heart disease of native coronary artery without angina pectoris: Secondary | ICD-10-CM

## 2016-12-30 DIAGNOSIS — Z87891 Personal history of nicotine dependence: Secondary | ICD-10-CM

## 2016-12-30 DIAGNOSIS — G893 Neoplasm related pain (acute) (chronic): Secondary | ICD-10-CM | POA: Diagnosis not present

## 2016-12-30 DIAGNOSIS — C779 Secondary and unspecified malignant neoplasm of lymph node, unspecified: Secondary | ICD-10-CM | POA: Diagnosis not present

## 2016-12-30 DIAGNOSIS — C7982 Secondary malignant neoplasm of genital organs: Secondary | ICD-10-CM

## 2016-12-30 DIAGNOSIS — N3289 Other specified disorders of bladder: Secondary | ICD-10-CM

## 2016-12-30 DIAGNOSIS — C7952 Secondary malignant neoplasm of bone marrow: Secondary | ICD-10-CM | POA: Diagnosis not present

## 2016-12-30 LAB — CBC
HCT: 26.9 % — ABNORMAL LOW (ref 40.0–52.0)
Hemoglobin: 9 g/dL — ABNORMAL LOW (ref 13.0–18.0)
MCH: 29.3 pg (ref 26.0–34.0)
MCHC: 33.5 g/dL (ref 32.0–36.0)
MCV: 87.5 fL (ref 80.0–100.0)
PLATELETS: 367 10*3/uL (ref 150–440)
RBC: 3.08 MIL/uL — ABNORMAL LOW (ref 4.40–5.90)
RDW: 15.4 % — AB (ref 11.5–14.5)
WBC: 5.4 10*3/uL (ref 3.8–10.6)

## 2016-12-30 MED ORDER — DARBEPOETIN ALFA 100 MCG/0.5ML IJ SOSY
50.0000 ug | PREFILLED_SYRINGE | Freq: Once | INTRAMUSCULAR | Status: AC
  Administered 2016-12-30: 50 ug via SUBCUTANEOUS
  Filled 2016-12-30: qty 0.5

## 2016-12-30 MED ORDER — DEGARELIX ACETATE 80 MG ~~LOC~~ SOLR
80.0000 mg | Freq: Once | SUBCUTANEOUS | Status: AC
  Administered 2016-12-30: 80 mg via SUBCUTANEOUS
  Filled 2016-12-30: qty 4

## 2016-12-30 MED ORDER — OXYCODONE HCL 10 MG PO TABS: 10.0000 mg | ORAL_TABLET | ORAL | 0 refills | Status: DC | PRN

## 2016-12-30 NOTE — Progress Notes (Signed)
Hematology/Oncology Consult note [facility]  [phone] [phone]  Patient Care Team: No Pcp Per Patient as PCP - General (General Practice)   Name of the patient: Johnathan Gould  [name]  July 25, 1935   Date of visit: 12/30/16  Diagnosis- 1. 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 Anemia likely from CKD and IDA  Chief complaint/ Reason for visit- discuss management options for prostate cancer  Heme/Onc history: patient is a 82 year old male was recently admitted to the hospital on 10/16/2016 with symptoms of left hip pain and renal failure. He was found to have bilateral hydronephrosis and is status post bilateral nephrostomy tube placement. CT abdomen showed an irregular polypoid mass at the posterior aspect of the bladder highly concerning for bladder carcinoma. Extensive bulky adenopathy throughout the abdomen and pelvis consistent with nodal metastases. Widespread osseous metastatic disease. Bone scan showed multifocal areas of increased activity throughout the pelvis, bilateral femurs, rib cage, right scapula, thoracic cervical and lumbar spine consistent with metastatic disease. Patient was found to have enlarged abnormal prostate as well as an elevated PSA of 469 and was presumptively diagnosed with metastatic prostate cancer. He was started on Firmagon by urology. He was also seen by radiation oncology and started palliative radiation to his left hip on 11/04/2016 and finished 10 doses of palliative Rt.  He is currently on monthly firmagon  4 core biopsy obtained for tissue diagnosis which showed: A. PROSTATIC ADENOCARCINOMA. GLEASON'S SCORE 10 (GRADES 5 + 5) NOTED IN 2  OUT OF 2 SUBMITTED PROSTATE CORE SEGMENTS. APPROXIMATELY 100% OF SUBMITTED  TISSUE INVOLVED. (GRADE GROUP 5)  B. PROSTATIC ADENOCARCINOMA. GLEASON'S SCORE 9 (GRADES 5 + 4) NOTED IN 1  OUT OF 1 SUBMITTED PROSTATE CORE SEGMENTS.  APPROXIMATELY 100% OF SUBMITTED  TISSUE INVOLVED. (GRADE GROUP 5)  C. PROSTATIC ADENOCARCINOMA. GLEASON'S SCORE 10 (GRADES 5 + 5) NOTED IN 1  OUT OF 1 SUBMITTED PROSTATE CORE SEGMENTS. APPROXIMATELY 100% OF SUBMITTED  TISSUE INVOLVED. (GRADE GROUP 5)  D. PROSTATIC ADENOCARCINOMA. GLEASON'S SCORE 10 (GRADES 5 + 5) NOTED IN 1  OUT OF 1 SUBMITTED PROSTATE CORE SEGMENTS. APPROXIMATELY 100% OF SUBMITTED  TISSUE INVOLVED. (GRADE GROUP 5)   Interval history- patient reports intermittent lower abdominal pain. Nephrostomy tubes were changed last week. No fevers. He is yet to get co pay assistance on his zytiga  ECOG PS- 2 Pain scale- 3   Review of systems- Review of Systems  Constitutional: Positive for malaise/fatigue. Negative for chills, fever and weight loss.  HENT: Negative for congestion, ear discharge and nosebleeds.   Eyes: Negative for blurred vision.  Respiratory: Negative for cough, hemoptysis, sputum production, shortness of breath and wheezing.   Cardiovascular: Negative for chest pain, palpitations, orthopnea and claudication.  Gastrointestinal: Positive for abdominal pain. Negative for blood in stool, constipation, diarrhea, heartburn, melena, nausea and vomiting.  Genitourinary: Negative for dysuria, flank pain, frequency, hematuria and urgency.  Musculoskeletal: Negative for back pain, joint pain and myalgias.  Skin: Negative for rash.  Neurological: Negative for dizziness, tingling, focal weakness, seizures, weakness and headaches.  Endo/Heme/Allergies: Does not bruise/bleed easily.  Psychiatric/Behavioral: Negative for depression and suicidal ideas. The patient does not have insomnia.      Current treatment- firmagon  No Known Allergies   Past Medical History:  Diagnosis Date  . CAD (coronary artery disease)   . Cancer ([place])   . Hypertension   . RAD (reactive airway disease)      Past  Surgical History:  Procedure Laterality Date  . CORONARY ANGIOPLASTY WITH  STENT PLACEMENT    . IR GENERIC HISTORICAL  10/17/2016   IR NEPHROSTOMY PLACEMENT RIGHT 10/17/2016 [facility]INTER[facility] RAD  . IR GENERIC HISTORICAL  10/17/2016   IR NEPHROSTOMY PLACEMENT LEFT 10/17/2016 [name], MD [facility]INTER[facility]  . IR GENERIC HISTORICAL  12/23/2016   IR NEPHROSTOMY EXCHANGE LEFT 12/23/2016 [facility]  . IR GENERIC HISTORICAL  12/23/2016   IR NEPHROSTOMY EXCHANGE RIGHT 12/23/2016 [facility]INTER[facility]  . KNEE SURGERY Left 2006   Laparascopy done on left knee, scar tissue taken out  . nephrostomy tubes Bilateral     Social History   Social History  . Marital status: Married    Spouse name: N/A  . Number of children: N/A  . Years of education: N/A   Occupational History  . Not on file.   Social History Main Topics  . Smoking status: Former Smoker    Packs/day: 1.00    Years: 69.50    Quit date: 11/04/2014  . Smokeless tobacco: Never Used  . Alcohol use No  . Drug use: No  . Sexual activity: Yes   Other Topics Concern  . Not on file   Social History Narrative  . No narrative on file    Family History  Problem Relation Age of Onset  . Hypertension Other   . Lung cancer Brother      Current Outpatient Prescriptions:  .  abiraterone Acetate (ZYTIGA) 250 MG tablet, Take 4 tablets (1,000 mg total) by mouth daily. Take on an empty stomach 1 hour before or 2 hours after a meal, Disp: 120 tablet, Rfl: 0 .  fentaNYL (DURAGESIC - DOSED MCG/HR) 25 MCG/HR patch, Place 1 patch (25 mcg total) onto the skin every 3 (three) days., Disp: 10 patch, Rfl: 0 .  hydrochlorothiazide (HYDRODIURIL) 12.5 MG tablet, , Disp: , Rfl:  .  metoprolol (LOPRESSOR) 50 MG tablet, , Disp: , Rfl:  .  nitroGLYCERIN (NITROSTAT) 0.4 MG SL tablet, Place 0.4 mg under the tongue every 5 (five) minutes x 3 doses as needed for chest pain., Disp: , Rfl:  .  ondansetron (ZOFRAN) 4 MG tablet, Take 1 tablet (4 mg total) by mouth every 6 (six) hours as needed for nausea., Disp: 40 tablet, Rfl: 1 .   Oxycodone HCl 10 MG TABS, Take 1 tablet (10 mg total) by mouth every 4 (four) hours as needed., Disp: 60 tablet, Rfl: 0 .  polyethylene glycol (MIRALAX / GLYCOLAX) packet, Take 17 g by mouth daily., Disp: , Rfl:  .  predniSONE (DELTASONE) 5 MG tablet, Take 1 tablet (5 mg total) by mouth daily with breakfast., Disp: 30 tablet, Rfl: 0 .  senna-docusate (SENOKOT-S) 8.6-50 MG tablet, Take 2 tablets by mouth 2 (two) times daily., Disp: 30 tablet, Rfl: 0 .  SPS 15 GM/60ML suspension, Take 60 mLs by mouth daily., Disp: , Rfl:   Physical exam:  Vitals:   12/30/16 1427  BP: 122/76  Pulse: 69  Resp: 18  Temp: 98.6 F (37 C)  TempSrc: Tympanic  Weight: 165 lb 12.6 oz (75.2 kg)  Height: 5\' 8"  (1.727 m)   Physical Exam  Constitutional: He is oriented to person, place, and time and well-developed, well-nourished, and in no distress.  HENT:  Head: Normocephalic and atraumatic.  Eyes: EOM are normal. Pupils are equal, round, and reactive to light.  Neck: Normal range of motion.  Cardiovascular: Normal rate, regular rhythm and normal heart sounds.   Pulmonary/Chest: Effort  normal and breath sounds normal.  Abdominal: Soft. Bowel sounds are normal.  Bilateral nephrostomy tubes in place draining clear urine  Neurological: He is alert and oriented to person, place, and time.  Skin: Skin is warm and dry.     CMP Latest Ref Rng & Units 12/10/2016  Glucose 65 - 99 mg/dL 120(H)  BUN 6 - 20 mg/dL 19  Creatinine 0.61 - 1.24 mg/dL 2.29(H)  Sodium 135 - 145 mmol/L 134(L)  Potassium 3.5 - 5.1 mmol/L 4.6  Chloride 101 - 111 mmol/L 99(L)  CO2 22 - 32 mmol/L 30  Calcium 8.9 - 10.3 mg/dL 8.2(L)  Total Protein 6.5 - 8.1 g/dL 6.7  Total Bilirubin 0.3 - 1.2 mg/dL 0.3  Alkaline Phos 38 - 126 U/L 197(H)  AST 15 - 41 U/L 18  ALT 17 - 63 U/L 12(L)   CBC Latest Ref Rng & Units 12/10/2016  WBC 3.8 - 10.6 K/uL 4.8  Hemoglobin 13.0 - 18.0 g/dL 8.5(L)  Hematocrit 40.0 - 52.0 % 25.0(L)  Platelets 150 - 440 K/uL  526(H)    No images are attached to the encounter.  Ir Nephrostomy Exchange Left  Result Date: 12/23/2016 INDICATION: 82 year old male with a history of a prostate cancer and bilateral distal ureteral obstruction necessitating placement of bilateral percutaneous nephrostomy tubes on 10/17/2016. He presents today for his initial nephrostomy tube check and exchange. EXAM: IR EXCHANGE NEPHROSTOMY RIGHT; IR EXCHANGE NEPHROSTOMY LEFT COMPARISON:  Recent abdominal imaging 11/20/2016; nephrostomy tube placement images 10/17/16 MEDICATIONS: None ANESTHESIA/SEDATION: Fentanyl 100 mcg IV; Versed Three mg IV Moderate Sedation Time:  18 minutes The patient was continuously monitored during the procedure by the interventional radiology nurse under my direct supervision. CONTRAST:  87mL ISOVUE-300 IOPAMIDOL (ISOVUE-300) INJECTION 61% - administered into the collecting system(s) FLUOROSCOPY TIME:  Fluoroscopy Time: 3 minutes 30 seconds (60 mGy). COMPLICATIONS: None immediate. PROCEDURE: Informed written consent was obtained from the patient after a thorough discussion of the procedural risks, benefits and alternatives. All questions were addressed. Maximal Sterile Barrier Technique was utilized including caps, mask, sterile gowns, sterile gloves, sterile drape, hand hygiene and skin antiseptic. A timeout was performed prior to the initiation of the procedure. Attention was first turned to the left percutaneous nephrostomy tube. A gentle hand injection of contrast material was performed. The contrast material opacifies the renal collecting system. The tube is pulled back and is just within the renal calices. There is no evidence of hydronephrosis. There is slow drainage of contrast material down the ureter and into the bladder. The tube was transected and removed over an Amplatz wire. A new Cook 10.2 [place] all-purpose drainage catheter was advanced over the wire and formed within the renal pelvis. The catheter was connected  to gravity bag drainage and secured to the skin with an adhesive fixation device. Sutures were not used secondary to local irritation from the prior suture. Attention was next turned to the right percutaneous nephrostomy tube. A gentle hand injection of contrast material opacifies the renal collecting system. The tube was pulled back into the calices. The tube was transected and removed over a wire. An angled catheter was advanced over the wire an used to reposition the wire into the renal pelvis. The 5 French catheter was removed. A new Cook 10.2 [place] all-purpose drainage catheter was advanced over the wire and formed with the locking loop in the renal pelvis. The catheter was connected to gravity bag drainage and secured to the skin with an adhesive fixation device. Persistent obstruction of  the left distal ureter without significant drainage into the bladder. IMPRESSION: 1. Improving right distal ureteral obstruction. Contrast material passes through the ureter and into the bladder albeit slowly. 2. Persistent left distal ureteral obstruction. 3. Successful routine exchange of bilateral 10 French nephrostomy tubes. PLAN: Return to Interventional Radiology in 6 weeks for next tube check and change. Complete nephrostograms should be performed. If the patient continues to experience response to therapy and the distal ureteral obstruction resolves, a capping trial could be considered. Signed, Criselda Peaches, MD [facility] Rad[facility] Electronically Signed   By: Jacqulynn Cadet M.D.   On: 12/23/2016 12:09   Ir Nephrostomy Exchange Right  Result Date: 12/23/2016 INDICATION: 82 year old male with a history of a prostate cancer and bilateral distal ureteral obstruction necessitating placement of bilateral percutaneous nephrostomy tubes on 10/17/2016. He presents today for his initial nephrostomy tube check and exchange. EXAM: IR EXCHANGE NEPHROSTOMY RIGHT; IR  EXCHANGE NEPHROSTOMY LEFT COMPARISON:  Recent abdominal imaging 11/20/2016; nephrostomy tube placement images 10/17/16 MEDICATIONS: None ANESTHESIA/SEDATION: Fentanyl 100 mcg IV; Versed Three mg IV Moderate Sedation Time:  18 minutes The patient was continuously monitored during the procedure by the interventional radiology nurse under my direct supervision. CONTRAST:  66mL ISOVUE-300 IOPAMIDOL (ISOVUE-300) INJECTION 61% - administered into the collecting system(s) FLUOROSCOPY TIME:  Fluoroscopy Time: 3 minutes 30 seconds (60 mGy). COMPLICATIONS: None immediate. PROCEDURE: Informed written consent was obtained from the patient after a thorough discussion of the procedural risks, benefits and alternatives. All questions were addressed. Maximal Sterile Barrier Technique was utilized including caps, mask, sterile gowns, sterile gloves, sterile drape, hand hygiene and skin antiseptic. A timeout was performed prior to the initiation of the procedure. Attention was first turned to the left percutaneous nephrostomy tube. A gentle hand injection of contrast material was performed. The contrast material opacifies the renal collecting system. The tube is pulled back and is just within the renal calices. There is no evidence of hydronephrosis. There is slow drainage of contrast material down the ureter and into the bladder. The tube was transected and removed over an Amplatz wire. A new Cook 10.2 [place] all-purpose drainage catheter was advanced over the wire and formed within the renal pelvis. The catheter was connected to gravity bag drainage and secured to the skin with an adhesive fixation device. Sutures were not used secondary to local irritation from the prior suture. Attention was next turned to the right percutaneous nephrostomy tube. A gentle hand injection of contrast material opacifies the renal collecting system. The tube was pulled back into the calices. The tube was transected and removed over a wire. An angled  catheter was advanced over the wire an used to reposition the wire into the renal pelvis. The 5 French catheter was removed. A new Cook 10.2 [place] all-purpose drainage catheter was advanced over the wire and formed with the locking loop in the renal pelvis. The catheter was connected to gravity bag drainage and secured to the skin with an adhesive fixation device. Persistent obstruction of the left distal ureter without significant drainage into the bladder. IMPRESSION: 1. Improving right distal ureteral obstruction. Contrast material passes through the ureter and into the bladder albeit slowly. 2. Persistent left distal ureteral obstruction. 3. Successful routine exchange of bilateral 10 French nephrostomy tubes. PLAN: Return to Interventional Radiology in 6 weeks for next tube check and change. Complete nephrostograms should be performed. If the patient continues to experience response to therapy and the distal ureteral obstruction resolves, a  capping trial could be considered. Signed, Criselda Peaches, MD [facility] Radiology Electronically Signed   By: Jacqulynn Cadet M.D.   On: 12/23/2016 12:09     Assessment and plan- Patient is a 82 y.o. male with high risk castrate sensitive metastatic prostate cancer with bulky bone and LN mets  1. Patient will get his next dose of firmagon today.   2. Anemia of chronic kidney disease along with iron deficiency and underlying malignancy- continue oral iron. We will give next dose of Aranesp today 25 % dose increase due to poor prior response  3. Neoplasm-related pain-continue fentanyl patch and when necessary oxycodone   4. Patient's son will look into this copious assistance for Zytiga and get back to [place] by early next week. If the co-pay assistance as not go through I did discuss proceeding with docetaxel at 25% dose reduction IV every 3 weeks 6 cycles. I will also given him written information about  docetaxel. Patient would like to think about this and get back to me next week. If he decides to proceed with docetaxel he will need port placement for chemotherapy. I discussed the risks and benefits of chemotherapy including all but not limited to nausea, vomiting, fatigue, risk of infections and low blood counts as well as hair loss. Patient understands and would like to get back to [place] next week about this. I discussed the results of the scan be trial which showed a survival and overall survival with docetaxel plus ADT versus ADT alone  5. I will see him tentatively in 2 weeks' time with CBC CMP and PSA as well as testosterone levels and tentatively plan to start docetaxel at that time  6. Lower abdominal pain- no evidence of infection. Nephrostomy tube is draining clear urine. Continue to monitor. Will check UA today  Visit Diagnosis 1. Metastatic adenocarcinoma to prostate ([place])   2. Prostate cancer metastatic to bone ([place])   3. Prostate cancer ([place])   4. Bladder mass   5. Neoplasm related pain   6. Anemia of chronic renal failure, unspecified CKD stage      Dr. Randa Evens, MD, MPH [facility] Pager- [phone] 12/30/2016 1:59 PM

## 2017-01-12 ENCOUNTER — Telehealth: Payer: Self-pay | Admitting: *Deleted

## 2017-01-12 NOTE — Telephone Encounter (Signed)
Called to let pt know what dates for his lab only and then his appt to have labs and see md.  The appt are 3/20 and then 4/3 and he is agreeable to all of them.  I also told him that when he comes for lab only I need to have him sign consent for the zytiga.  Usually it is done when pt gets drug but in case he was not sure if he wanted the drug and then not sure if he wanted to go through copay asst and then I got the call that he got approved and he got med and he started it the same day.  Patient was counseled by Dr [name], was given the teaching sheet by me, and [facility] went over side effects also , he had no additional questions while on the phone but I told him on next visit he will need to sign consent and he was agreeable.

## 2017-01-13 ENCOUNTER — Other Ambulatory Visit: Payer: Self-pay | Admitting: *Deleted

## 2017-01-13 ENCOUNTER — Inpatient Hospital Stay: Payer: Medicare Other

## 2017-01-13 ENCOUNTER — Telehealth: Payer: Self-pay | Admitting: *Deleted

## 2017-01-13 ENCOUNTER — Inpatient Hospital Stay: Payer: Medicare Other | Admitting: Oncology

## 2017-01-13 DIAGNOSIS — C61 Malignant neoplasm of prostate: Secondary | ICD-10-CM

## 2017-01-13 DIAGNOSIS — N3289 Other specified disorders of bladder: Secondary | ICD-10-CM

## 2017-01-13 MED ORDER — OXYCODONE HCL 10 MG PO TABS: 10.0000 mg | ORAL_TABLET | ORAL | 0 refills | Status: DC | PRN

## 2017-01-13 MED ORDER — FENTANYL 25 MCG/HR TD PT72: 25.0000 ug | MEDICATED_PATCH | TRANSDERMAL | 0 refills | Status: DC

## 2017-01-13 NOTE — Telephone Encounter (Signed)
Pt wants refill for oxycodone, pt came in today and I gave him the refill but he also needed the fentanyl patch and I gave that to  Him also. Patient also signed his consent for the zytiga. He has been on it 1 week.  Tolerating well per pt. Pt was given teaching sheet and it had already been given to him, he was talked to by dr. [name] and read the info and was given counseling by pahrmacy at [facility], he had no questions and signed consent

## 2017-01-18 ENCOUNTER — Inpatient Hospital Stay: Payer: Medicare Other

## 2017-01-18 DIAGNOSIS — C7951 Secondary malignant neoplasm of bone: Secondary | ICD-10-CM

## 2017-01-18 DIAGNOSIS — C7952 Secondary malignant neoplasm of bone marrow: Secondary | ICD-10-CM | POA: Diagnosis not present

## 2017-01-18 DIAGNOSIS — C7982 Secondary malignant neoplasm of genital organs: Secondary | ICD-10-CM

## 2017-01-18 DIAGNOSIS — C61 Malignant neoplasm of prostate: Secondary | ICD-10-CM

## 2017-01-18 LAB — COMPREHENSIVE METABOLIC PANEL
ALBUMIN: 3.7 g/dL (ref 3.5–5.0)
ALK PHOS: 138 U/L — AB (ref 38–126)
ALT: 14 U/L — ABNORMAL LOW (ref 17–63)
ANION GAP: 6 (ref 5–15)
AST: 19 U/L (ref 15–41)
BILIRUBIN TOTAL: 0.3 mg/dL (ref 0.3–1.2)
BUN: 26 mg/dL — ABNORMAL HIGH (ref 6–20)
CALCIUM: 9 mg/dL (ref 8.9–10.3)
CO2: 28 mmol/L (ref 22–32)
Chloride: 99 mmol/L — ABNORMAL LOW (ref 101–111)
Creatinine, Ser: 1.73 mg/dL — ABNORMAL HIGH (ref 0.61–1.24)
GFR calc Af Amer: 41 mL/min — ABNORMAL LOW (ref >60)
GFR, EST NON AFRICAN AMERICAN: 36 mL/min — AB (ref >60)
Glucose, Bld: 98 mg/dL (ref 65–99)
POTASSIUM: 3.1 mmol/L — AB (ref 3.5–5.1)
Sodium: 133 mmol/L — ABNORMAL LOW (ref 135–145)
TOTAL PROTEIN: 7.4 g/dL (ref 6.5–8.1)

## 2017-01-18 LAB — CBC WITH DIFFERENTIAL/PLATELET
BASOS ABS: 0.1 10*3/uL (ref 0–0.1)
BASOS PCT: 1 %
EOS ABS: 0.2 10*3/uL (ref 0–0.7)
Eosinophils Relative: 3 %
HEMATOCRIT: 28.6 % — AB (ref 40.0–52.0)
HEMOGLOBIN: 9.5 g/dL — AB (ref 13.0–18.0)
LYMPHS ABS: 0.9 10*3/uL — AB (ref 1.0–3.6)
LYMPHS PCT: 18 %
MCH: 29.1 pg (ref 26.0–34.0)
MCHC: 33.4 g/dL (ref 32.0–36.0)
MCV: 87 fL (ref 80.0–100.0)
MONOS PCT: 8 %
Monocytes Absolute: 0.4 10*3/uL (ref 0.2–1.0)
NEUTROS ABS: 3.6 10*3/uL (ref 1.4–6.5)
Neutrophils Relative %: 70 %
Platelets: 331 10*3/uL (ref 150–440)
RBC: 3.28 MIL/uL — ABNORMAL LOW (ref 4.40–5.90)
RDW: 15.4 % — AB (ref 11.5–14.5)
WBC: 5.2 10*3/uL (ref 3.8–10.6)

## 2017-01-18 LAB — PSA: PSA: 0.09 ng/mL (ref 0.00–4.00)

## 2017-01-19 LAB — TESTOSTERONE: Testosterone: <3 ng/dL — ABNORMAL LOW (ref 264–916)

## 2017-01-20 ENCOUNTER — Telehealth: Payer: Self-pay | Admitting: *Deleted

## 2017-01-20 MED ORDER — MAGIC MOUTHWASH: 5.0000 mL | Freq: Four times a day (QID) | ORAL | 0 refills | Status: DC | PRN

## 2017-01-20 NOTE — Telephone Encounter (Signed)
Thad called first and then he said he did not know how many blisters were on his Dad's tongue and did not know if he had white patches.  I called the house and spoke to pt's wife and she states he has 2 blisters on tip of tongue and it is about a dime size. He does not have white patches.  She states he had that when he was in hospital once and got MMW and it helped. I told her that I would speak to Dr. Mike Gip on call and see if she is ok with the MMW and if so I will call it in to pharmacy and if I have problems I will call wife back and she is agreeable to the plan. Dr. Mike Gip did say yes and I sent rx into drugstore

## 2017-01-21 ENCOUNTER — Ambulatory Visit (INDEPENDENT_AMBULATORY_CARE_PROVIDER_SITE_OTHER): Payer: Medicare Other | Admitting: Urology

## 2017-01-21 ENCOUNTER — Encounter: Payer: Self-pay | Admitting: Urology

## 2017-01-21 VITALS — BP 168/80 | HR 94 | Ht 68.0 in | Wt 168.1 lb

## 2017-01-21 DIAGNOSIS — N183 Chronic kidney disease, stage 3 unspecified: Secondary | ICD-10-CM

## 2017-01-21 DIAGNOSIS — C61 Malignant neoplasm of prostate: Secondary | ICD-10-CM

## 2017-01-21 DIAGNOSIS — N139 Obstructive and reflux uropathy, unspecified: Secondary | ICD-10-CM | POA: Diagnosis not present

## 2017-01-21 NOTE — Progress Notes (Signed)
01/21/2017 1:35 PM   Johnathan Gould 04/25/1935 [id number]  Referring provider: No referring provider defined for this encounter.  Chief Complaint  Patient presents with  . Prostate Cancer    HPI: 82 year old male with metastatic prostate cancer who initially presented to the emergency room on 10/16/2016 with severe left hip pain and renal failure. He is on bilateral hydronephrosis, extensive bulky adenopathy threat the abdomen pelvis, locally advanced prostate cancer, and widespread osseous metastatic disease. At the time of presentation his PSA was 469. He underwent placement of bilateral nephrostomy tubes and his creatinine improved.  He is now on ADT and has been seen and evaluated by Dr. [name] who has started him on the Zytiga/ prednisone in addition to lupron.    He did also receive 10 fractions of palliative radiation to his right hip.  Overall, he is doing dramatically better since his previous admissions. His right hip pain is completely resolved. His energy is improving.  He is now voiding some spontaneously through his penis despite his bilateral nephrostomy tubes draining well.  Most recent PSA 0.09 down from 469.  His nephrostomy tubes were last changed on 12/23/2016 at which time an antegrade nephrostogram was performed.  It showed sluggish drainage of contrast material from the right distal ureter and persistent left ureteral obstruction.  His nephrostomy tubes have been draining well. No issues with infections. He does have a hard time sleeping on his back secondary to the anchor clip and is anxious to have them removed if possible.   PMH: Past Medical History:  Diagnosis Date  . CAD (coronary artery disease)   . Cancer ([place])   . Hypertension   . RAD (reactive airway disease)     Surgical History: Past Surgical History:  Procedure Laterality Date  . CORONARY ANGIOPLASTY WITH STENT PLACEMENT    . IR GENERIC HISTORICAL  10/17/2016   IR NEPHROSTOMY PLACEMENT  RIGHT 10/17/2016 [facility]  . IR GENERIC HISTORICAL  10/17/2016   IR NEPHROSTOMY PLACEMENT LEFT 10/17/2016 [name], MD [facility]  . IR GENERIC HISTORICAL  12/23/2016   IR NEPHROSTOMY EXCHANGE LEFT 12/23/2016 [facility]  . IR GENERIC HISTORICAL  12/23/2016   IR NEPHROSTOMY EXCHANGE RIGHT 12/23/2016 [facility]  . KNEE SURGERY Left 2006   Laparascopy done on left knee, scar tissue taken out  . nephrostomy tubes Bilateral     Home Medications:  Allergies as of 01/21/2017   No Known Allergies     Medication List       Accurate as of 01/21/17  1:35 PM. Always use your most recent med list.          abiraterone Acetate 250 MG tablet Commonly known as:  ZYTIGA Take 4 tablets (1,000 mg total) by mouth daily. Take on an empty stomach 1 hour before or 2 hours after a meal   fentaNYL 25 MCG/HR patch Commonly known as:  DURAGESIC - dosed mcg/hr Place 1 patch (25 mcg total) onto the skin every 3 (three) days.   hydrochlorothiazide 12.5 MG tablet Commonly known as:  HYDRODIURIL   magic mouthwash Soln Take 5 mLs by mouth 4 (four) times daily as needed for mouth pain.   metoprolol 50 MG tablet Commonly known as:  LOPRESSOR   nitroGLYCERIN 0.4 MG SL tablet Commonly known as:  NITROSTAT Place 0.4 mg under the tongue every 5 (five) minutes x 3 doses as needed for chest pain.   ondansetron 4 MG tablet Commonly known as:  ZOFRAN Take 1 tablet (  4 mg total) by mouth every 6 (six) hours as needed for nausea.   Oxycodone HCl 10 MG Tabs Take 1 tablet (10 mg total) by mouth every 4 (four) hours as needed.   polyethylene glycol packet Commonly known as:  MIRALAX / GLYCOLAX Take 17 g by mouth daily as needed.   predniSONE 5 MG tablet Commonly known as:  DELTASONE Take 1 tablet (5 mg total) by mouth daily with breakfast.   SPS 15 GM/60ML suspension Generic drug:  sodium polystyrene Take 60 mLs by mouth daily.       Allergies: No Known Allergies  Family  History: Family History  Problem Relation Age of Onset  . Hypertension Other   . Lung cancer Brother     Social History:  reports that he quit smoking about 2 years ago. He has a 69.50 pack-year smoking history. He has never used smokeless tobacco. He reports that he does not drink alcohol or use drugs.  ROS: UROLOGY Frequent Urination?: No Hard to postpone urination?: No Burning/pain with urination?: No Get up at night to urinate?: No Leakage of urine?: No Urine stream starts and stops?: No Trouble starting stream?: No Do you have to strain to urinate?: No Blood in urine?: No Urinary tract infection?: No Sexually transmitted disease?: No Injury to kidneys or bladder?: No Painful intercourse?: No Weak stream?: No Erection problems?: No Penile pain?: No  Gastrointestinal Nausea?: No Vomiting?: No Indigestion/heartburn?: No Diarrhea?: No Constipation?: No  Constitutional Fever: No Night sweats?: No Weight loss?: No Fatigue?: No  Skin Skin rash/lesions?: No Itching?: No  Eyes Blurred vision?: No Double vision?: No  Ears/Nose/Throat Sore throat?: No Sinus problems?: No  Hematologic/Lymphatic Swollen glands?: No Easy bruising?: No  Cardiovascular Leg swelling?: No Chest pain?: No  Respiratory Cough?: No Shortness of breath?: No  Endocrine Excessive thirst?: No  Musculoskeletal Back pain?: No Joint pain?: No  Neurological Headaches?: No Dizziness?: No  Psychologic Depression?: No Anxiety?: No  Physical Exam: BP (!) 168/80   Pulse 94   Ht 5\' 8"  (1.727 m)   Wt 168 lb 1.6 oz (76.2 kg)   BMI 25.56 kg/m   Constitutional:  Alert and oriented, No acute distress.  Wife present today. HEENT: [place] AT, moist mucus membranes.  Trachea midline, no masses. Cardiovascular: No clubbing, cyanosis, or edema. Respiratory: Normal respiratory effort, no increased work of breathing. GI: Abdomen is soft, nontender, nondistended, no abdominal masses GU:  Bilateral nephrostomy tubes in place, draining clear yellow urine. Skin: No rashes, bruises or suspicious lesions. Neurologic: Grossly intact, no focal deficits, moving all 4 extremities. Psychiatric: Normal mood and affect.  Laboratory Data: Lab Results  Component Value Date   WBC 5.2 01/18/2017   HGB 9.5 (L) 01/18/2017   HCT 28.6 (L) 01/18/2017   MCV 87.0 01/18/2017   PLT 331 01/18/2017    Lab Results  Component Value Date   CREATININE 1.73 (H) 01/18/2017    Lab Results  Component Value Date   PSA 0.09 01/18/2017   PSA 2.52 11/25/2016   PSA 1.98 11/22/2016    Lab Results  Component Value Date   TESTOSTERONE <3 (L) 01/18/2017    Urinalysis    Component Value Date/Time   COLORURINE YELLOW (A) 11/20/2016 1526   APPEARANCEUR CLOUDY (A) 11/20/2016 1526   LABSPEC 1.013 11/20/2016 1526   PHURINE 5.0 11/20/2016 1526   GLUCOSEU NEGATIVE 11/20/2016 1526   HGBUR SMALL (A) 11/20/2016 1526   BILIRUBINUR NEGATIVE 11/20/2016 [place] 11/20/2016 1526  PROTEINUR 30 (A) 11/20/2016 1526   NITRITE NEGATIVE 11/20/2016 1526   LEUKOCYTESUR SMALL (A) 11/20/2016 1526    Pertinent Imaging: Antegrade nephrostograms reviewed from 12/23/2016  Assessment & Plan:    1. Prostate cancer ([place]) On Lupron/like to go, prednisone managed by Dr. [name] PSA nearly undetectable  2. Urinary obstruction Bilateral ureteral obstruction secondary to adenopathy and obstructing pelvic tumor Biochemical response to ADT is excellent, no recent imaging but suspect significant decrease in his overall tumor burden Antegrade nephrostogram show slowly improving urinary obstruction I have recommended repeat antegrade nephrostogram in early April with next nephrostomy tube exchange If there is brisk drainage at that time, we will attempt capping trial followed by removal If obstruction persists, discuss options including maintaining nephrostomy tubes versus internalization with double-J  stents- risks and benefits of each discussed and he is not interested in ureteral stent All questions were answered today  3. CKD Cr improved from acute renal failure with bilateral stents Suspect new baseline  Return in about 3 months (around 04/23/2017) for recheck prostate cancer.  Hollice Espy, MD  [facility] [address] [place], [place] [address] [phone]  I spent 25 min with this patient of which greater than 50% was spent in counseling and coordination of care with the patient.

## 2017-01-27 ENCOUNTER — Other Ambulatory Visit: Payer: Self-pay | Admitting: *Deleted

## 2017-01-27 MED ORDER — ONDANSETRON HCL 4 MG PO TABS: 4.0000 mg | ORAL_TABLET | Freq: Four times a day (QID) | ORAL | 1 refills | Status: DC | PRN

## 2017-01-31 ENCOUNTER — Other Ambulatory Visit: Payer: Self-pay | Admitting: Radiology

## 2017-01-31 DIAGNOSIS — C61 Malignant neoplasm of prostate: Secondary | ICD-10-CM

## 2017-02-01 ENCOUNTER — Inpatient Hospital Stay: Payer: Medicare Other

## 2017-02-01 ENCOUNTER — Inpatient Hospital Stay (HOSPITAL_BASED_OUTPATIENT_CLINIC_OR_DEPARTMENT_OTHER): Payer: Medicare Other | Admitting: Oncology

## 2017-02-01 ENCOUNTER — Inpatient Hospital Stay: Payer: Medicare Other | Attending: Oncology

## 2017-02-01 ENCOUNTER — Encounter: Payer: Self-pay | Admitting: Oncology

## 2017-02-01 ENCOUNTER — Telehealth: Payer: Self-pay | Admitting: Pharmacist

## 2017-02-01 ENCOUNTER — Other Ambulatory Visit: Payer: Self-pay | Admitting: *Deleted

## 2017-02-01 VITALS — BP 138/77 | HR 71 | Temp 97.8°F | Resp 18 | Wt 170.3 lb

## 2017-02-01 DIAGNOSIS — I2510 Atherosclerotic heart disease of native coronary artery without angina pectoris: Secondary | ICD-10-CM | POA: Diagnosis not present

## 2017-02-01 DIAGNOSIS — C779 Secondary and unspecified malignant neoplasm of lymph node, unspecified: Secondary | ICD-10-CM | POA: Insufficient documentation

## 2017-02-01 DIAGNOSIS — N189 Chronic kidney disease, unspecified: Secondary | ICD-10-CM

## 2017-02-01 DIAGNOSIS — J45909 Unspecified asthma, uncomplicated: Secondary | ICD-10-CM

## 2017-02-01 DIAGNOSIS — N1330 Unspecified hydronephrosis: Secondary | ICD-10-CM | POA: Insufficient documentation

## 2017-02-01 DIAGNOSIS — G893 Neoplasm related pain (acute) (chronic): Secondary | ICD-10-CM | POA: Insufficient documentation

## 2017-02-01 DIAGNOSIS — I129 Hypertensive chronic kidney disease with stage 1 through stage 4 chronic kidney disease, or unspecified chronic kidney disease: Secondary | ICD-10-CM | POA: Diagnosis not present

## 2017-02-01 DIAGNOSIS — C7951 Secondary malignant neoplasm of bone: Secondary | ICD-10-CM | POA: Insufficient documentation

## 2017-02-01 DIAGNOSIS — D631 Anemia in chronic kidney disease: Secondary | ICD-10-CM

## 2017-02-01 DIAGNOSIS — C61 Malignant neoplasm of prostate: Secondary | ICD-10-CM

## 2017-02-01 DIAGNOSIS — Z79818 Long term (current) use of other agents affecting estrogen receptors and estrogen levels: Secondary | ICD-10-CM

## 2017-02-01 DIAGNOSIS — Z801 Family history of malignant neoplasm of trachea, bronchus and lung: Secondary | ICD-10-CM

## 2017-02-01 DIAGNOSIS — C7982 Secondary malignant neoplasm of genital organs: Secondary | ICD-10-CM

## 2017-02-01 DIAGNOSIS — Z79899 Other long term (current) drug therapy: Secondary | ICD-10-CM

## 2017-02-01 DIAGNOSIS — D649 Anemia, unspecified: Secondary | ICD-10-CM | POA: Diagnosis not present

## 2017-02-01 DIAGNOSIS — Z87891 Personal history of nicotine dependence: Secondary | ICD-10-CM

## 2017-02-01 DIAGNOSIS — N3289 Other specified disorders of bladder: Secondary | ICD-10-CM

## 2017-02-01 LAB — CBC
HEMATOCRIT: 31.4 % — AB (ref 40.0–52.0)
Hemoglobin: 10.7 g/dL — ABNORMAL LOW (ref 13.0–18.0)
MCH: 29.4 pg (ref 26.0–34.0)
MCHC: 33.9 g/dL (ref 32.0–36.0)
MCV: 86.7 fL (ref 80.0–100.0)
Platelets: 354 10*3/uL (ref 150–440)
RBC: 3.63 MIL/uL — ABNORMAL LOW (ref 4.40–5.90)
RDW: 15.1 % — ABNORMAL HIGH (ref 11.5–14.5)
WBC: 7 10*3/uL (ref 3.8–10.6)

## 2017-02-01 LAB — PSA: PSA: 0.09 ng/mL (ref 0.00–4.00)

## 2017-02-01 MED ORDER — DARBEPOETIN ALFA 100 MCG/0.5ML IJ SOSY
50.0000 ug | PREFILLED_SYRINGE | Freq: Once | INTRAMUSCULAR | Status: DC
  Filled 2017-02-01: qty 0.5

## 2017-02-01 MED ORDER — LEUPROLIDE ACETATE (3 MONTH) 22.5 MG IM KIT
22.5000 mg | PACK | Freq: Once | INTRAMUSCULAR | Status: AC
  Administered 2017-02-01: 22.5 mg via INTRAMUSCULAR

## 2017-02-01 MED ORDER — FENTANYL 25 MCG/HR TD PT72: 25.0000 ug | MEDICATED_PATCH | TRANSDERMAL | 0 refills | Status: DC

## 2017-02-01 MED ORDER — OXYCODONE HCL 10 MG PO TABS: 10.0000 mg | ORAL_TABLET | ORAL | 0 refills | Status: DC | PRN

## 2017-02-01 NOTE — Progress Notes (Signed)
MD decided to hold on aranesp at this time upon further consideration. No aranesp given 4/3

## 2017-02-01 NOTE — Telephone Encounter (Signed)
Oral Chemotherapy Encounter - Zytiga  Mr. Barella was called on 02/01/2017 to discuss his medication, abiraterone.   - Call Type: First call  - Medication (how patient has been taking): Abiraterone 1000mg  (4 tabs) PO daily in the morning along with his prednisone 5mg  on empty stomach.  - Date medication was received from pharmacy/start date: Patient just received second shipment of medication from pharmacy this week. Has been taking for about a month.   - Issues with obtaining medication (cost, insurance coverage, etc.): None, patient received initial counseling from physician and pharmacy where he receives medication.  - Adherence Techniques: Takes every AM  - Number of missed doses: None  - Adverse Effects experienced: No side effects with medication noted by patient with exception a little nausea at the beginning which has resolved and a little ankle swelling, which was evaluated by his doctor today.   - Drug Interactions: Zytiga can increase metoprolol concentrations, discussed with patient role of blood pressure and heart rate monitoring.   Demetrius Charity, PharmD Acute Care Pharmacy Resident  Pager: [phone] 02/01/2017

## 2017-02-01 NOTE — Progress Notes (Signed)
Hematology/Oncology Consult note [facility]  Telephone:([phone] Fax:([phone]) [phone]  Patient Care Team: No Pcp Per Patient as PCP - General (General Practice)   Name of the patient: Johnathan Gould  [id number]  10-01-1935   Date of visit: 02/01/17  Diagnosis- 1. 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 Anemia likely from CKD and IDA   Chief complaint/ Reason for visit- routine f/u  Heme/Onc history: patient is a 82 year old male was recently admitted to the hospital on 10/16/2016 with symptoms of left hip pain and renal failure. He was found to have bilateral hydronephrosis and is status post bilateral nephrostomy tube placement. CT abdomen showed an irregular polypoid mass at the posterior aspect of the bladder highly concerning for bladder carcinoma. Extensive bulky adenopathy throughout the abdomen and pelvis consistent with nodal metastases. Widespread osseous metastatic disease. Bone scan showed multifocal areas of increased activity throughout the pelvis, bilateral femurs, rib cage, right scapula, thoracic cervical and lumbar spine consistent with metastatic disease. Patient was found to have enlarged abnormal prostate as well as an elevated PSA of 469 and was presumptively diagnosed with metastatic prostate cancer. He was started on Firmagon by urology. He was also seen by radiation oncology and started palliative radiation to his left hip on 11/04/2016 and finished 10 doses of palliative Rt.  He is currently on monthly firmagon  4 core biopsy obtained for tissue diagnosis which showed: A. PROSTATIC ADENOCARCINOMA. GLEASON'S SCORE 10 (GRADES 5 + 5) NOTED IN 2  OUT OF 2 SUBMITTED PROSTATE CORE SEGMENTS. APPROXIMATELY 100% OF SUBMITTED  TISSUE INVOLVED. (GRADE GROUP 5)  B. PROSTATIC ADENOCARCINOMA. GLEASON'S SCORE 9 (GRADES 5 + 4) NOTED IN 1  OUT OF 1 SUBMITTED PROSTATE CORE SEGMENTS. APPROXIMATELY 100% OF SUBMITTED    TISSUE INVOLVED. (GRADE GROUP 5)  C. PROSTATIC ADENOCARCINOMA. GLEASON'S SCORE 10 (GRADES 5 + 5) NOTED IN 1  OUT OF 1 SUBMITTED PROSTATE CORE SEGMENTS. APPROXIMATELY 100% OF SUBMITTED  TISSUE INVOLVED. (GRADE GROUP 5)  D. PROSTATIC ADENOCARCINOMA. GLEASON'S SCORE 10 (GRADES 5 + 5) NOTED IN 1  OUT OF 1 SUBMITTED PROSTATE CORE SEGMENTS. APPROXIMATELY 100% OF SUBMITTED  TISSUE INVOLVED. (GRADE GROUP 5)    Given high risk metastatic prostate cancer- patient was started on zytiga on March 3rd 2018  Component     Latest Ref Rng & Units 10/17/2016 11/22/2016 11/25/2016 01/18/2017  PSA     0.00 - 4.00 ng/mL 469.00 (H) 1.98 2.52 0.09    Interval history- Currently is using his oxycodone about 4-5 doses per day and still reports pain mainly in his back as well as bilateral lower extremities. He also has pain mainly the nephrostomy tube site. Bowel movements have been regular. He did have a couple of blisters in his mouth which resolved with Magic mouthwash.  ECOG PS- 2 Pain scale- 0 Opioid associated constipation- no  Review of systems- Review of Systems  Constitutional: Negative for chills, fever, malaise/fatigue and weight loss.  HENT: Negative for congestion, ear discharge and nosebleeds.   Eyes: Negative for blurred vision.  Respiratory: Negative for cough, hemoptysis, sputum production, shortness of breath and wheezing.   Cardiovascular: Negative for chest pain, palpitations, orthopnea and claudication.  Gastrointestinal: Negative for abdominal pain, blood in stool, constipation, diarrhea, heartburn, melena, nausea and vomiting.  Genitourinary: Negative for dysuria, flank pain, frequency, hematuria and urgency.  Musculoskeletal: Negative for back pain, joint pain and myalgias.  Skin: Negative for rash.  Neurological: Negative for dizziness, tingling,  focal weakness, seizures, weakness and headaches.  Endo/Heme/Allergies: Does not bruise/bleed easily.  Psychiatric/Behavioral: Negative  for depression and suicidal ideas. The patient does not have insomnia.      Current treatment- zytiga + firmagon  No Known Allergies   Past Medical History:  Diagnosis Date  . CAD (coronary artery disease)   . Cancer ([place])   . Hypertension   . RAD (reactive airway disease)      Past Surgical History:  Procedure Laterality Date  . CORONARY ANGIOPLASTY WITH STENT PLACEMENT    . IR GENERIC HISTORICAL  10/17/2016   IR NEPHROSTOMY PLACEMENT RIGHT 10/17/2016 [facility]  . IR GENERIC HISTORICAL  10/17/2016   IR NEPHROSTOMY PLACEMENT LEFT 10/17/2016 [name], MD [facility]  . IR GENERIC HISTORICAL  12/23/2016   IR NEPHROSTOMY EXCHANGE LEFT 12/23/2016 [facility]  . IR GENERIC HISTORICAL  12/23/2016   IR NEPHROSTOMY EXCHANGE RIGHT 12/23/2016 [facility]  . KNEE SURGERY Left 2006   Laparascopy done on left knee, scar tissue taken out  . nephrostomy tubes Bilateral     Social History   Social History  . Marital status: Married    Spouse name: N/A  . Number of children: N/A  . Years of education: N/A   Occupational History  . Not on file.   Social History Main Topics  . Smoking status: Former Smoker    Packs/day: 1.00    Years: 69.50    Quit date: 11/04/2014  . Smokeless tobacco: Never Used  . Alcohol use No  . Drug use: No  . Sexual activity: Yes   Other Topics Concern  . Not on file   Social History Narrative  . No narrative on file    Family History  Problem Relation Age of Onset  . Hypertension Other   . Lung cancer Brother      Current Outpatient Prescriptions:  .  abiraterone Acetate (ZYTIGA) 250 MG tablet, Take 4 tablets (1,000 mg total) by mouth daily. Take on an empty stomach 1 hour before or 2 hours after a meal, Disp: 120 tablet, Rfl: 0 .  fentaNYL (DURAGESIC - DOSED MCG/HR) 25 MCG/HR patch, Place 1 patch (25 mcg total) onto the skin every 3 (three) days., Disp: 10 patch, Rfl: 0 .  hydrochlorothiazide (HYDRODIURIL) 12.5 MG  tablet, , Disp: , Rfl:  .  magic mouthwash SOLN, Take 5 mLs by mouth 4 (four) times daily as needed for mouth pain., Disp: 480 mL, Rfl: 0 .  metoprolol (LOPRESSOR) 50 MG tablet, , Disp: , Rfl:  .  nitroGLYCERIN (NITROSTAT) 0.4 MG SL tablet, Place 0.4 mg under the tongue every 5 (five) minutes x 3 doses as needed for chest pain., Disp: , Rfl:  .  ondansetron (ZOFRAN) 4 MG tablet, Take 1 tablet (4 mg total) by mouth every 6 (six) hours as needed for nausea., Disp: 40 tablet, Rfl: 1 .  Oxycodone HCl 10 MG TABS, Take 1 tablet (10 mg total) by mouth every 4 (four) hours as needed., Disp: 60 tablet, Rfl: 0 .  polyethylene glycol (MIRALAX / GLYCOLAX) packet, Take 17 g by mouth daily as needed. , Disp: , Rfl:  .  predniSONE (DELTASONE) 5 MG tablet, Take 1 tablet (5 mg total) by mouth daily with breakfast., Disp: 30 tablet, Rfl: 0 .  SPS 15 GM/60ML suspension, Take 60 mLs by mouth daily., Disp: , Rfl:   Physical exam:  Vitals:   02/01/17 0843  BP: 138/77  Pulse: 71  Resp: 18  Temp:  97.8 F (36.6 C)  TempSrc: Tympanic  Weight: 170 lb 5 oz (77.3 kg)   Physical Exam  Constitutional: He is oriented to person, place, and time and well-developed, well-nourished, and in no distress.  HENT:  Head: Normocephalic and atraumatic.  3 healed blisters noted on tongue  Eyes: EOM are normal. Pupils are equal, round, and reactive to light.  Neck: Normal range of motion.  Cardiovascular: Normal rate, regular rhythm and normal heart sounds.   Pulmonary/Chest: Effort normal and breath sounds normal.  Abdominal: Soft. Bowel sounds are normal.  b/l nephrostomy tubes in place  Musculoskeletal: He exhibits edema (+1).  Neurological: He is alert and oriented to person, place, and time.  Skin: Skin is warm and dry.     CMP Latest Ref Rng & Units 01/18/2017  Glucose 65 - 99 mg/dL 98  BUN 6 - 20 mg/dL 26(H)  Creatinine 0.61 - 1.24 mg/dL 1.73(H)  Sodium 135 - 145 mmol/L 133(L)  Potassium 3.5 - 5.1 mmol/L 3.1(L)    Chloride 101 - 111 mmol/L 99(L)  CO2 22 - 32 mmol/L 28  Calcium 8.9 - 10.3 mg/dL 9.0  Total Protein 6.5 - 8.1 g/dL 7.4  Total Bilirubin 0.3 - 1.2 mg/dL 0.3  Alkaline Phos 38 - 126 U/L 138(H)  AST 15 - 41 U/L 19  ALT 17 - 63 U/L 14(L)   CBC Latest Ref Rng & Units 02/01/2017  WBC 3.8 - 10.6 K/uL 7.0  Hemoglobin 13.0 - 18.0 g/dL 10.7(L)  Hematocrit 40.0 - 52.0 % 31.4(L)  Platelets 150 - 440 K/uL 354      Assessment and plan- Patient is a 82 y.o. male with high risk castrate sensitive metastatic prostate cancer  1. Patient is due for next dose of firmagon today. We will switch him to Q3 monthly lupron at this time. LFT's stable. Continue zytiga. Will also order dexa scan today for osteoporosis screening. PSA has responded very well.   2. Anemia of chronic kidney disease along with iron deficiency and underlying malignancy- continue oral iron. H/H significantly improved to 10.7/31.4 today. Hold aranesp today  3. Neoplasm-related pain-continue fentanyl patch and when necessary oxycodone. Increase dose of patch by 12 mcg. Refill for oxycodone given  4. Patient will get his LFTs checked every 2 weeks for the next 2 months. I will see him back in 1 month's time CBC, CMP and PSA. He will get Aranesp at that visit depending on his hemoglobin. We will repeat scans after June 2018 after he gets 3 months of Zytiga    Visit Diagnosis 1. Prostate cancer metastatic to bone ([place])   2. Anemia of chronic renal failure, unspecified CKD stage   3. Neoplasm related pain      Dr. Randa Evens, MD, MPH [facility] Pager- [phone] 02/01/2017 9:59 AM

## 2017-02-01 NOTE — Progress Notes (Signed)
Patient offers no complaints today. 

## 2017-02-01 NOTE — Progress Notes (Signed)
Hgb 10.7 today. Called and spoke with MD. MD ok to proceed with aranesp with Hgb less than 11 and then follow up at next visit.

## 2017-02-04 ENCOUNTER — Ambulatory Visit: Admission: RE | Admit: 2017-02-04 | Payer: Medicare Other | Source: Ambulatory Visit

## 2017-02-04 ENCOUNTER — Ambulatory Visit: Payer: Medicare Other

## 2017-02-10 ENCOUNTER — Other Ambulatory Visit: Payer: Self-pay | Admitting: Radiology

## 2017-02-10 ENCOUNTER — Telehealth: Payer: Self-pay | Admitting: *Deleted

## 2017-02-10 MED ORDER — CLOTRIMAZOLE 10 MG MT LOZG: 10.0000 mg | LOZENGE | Freq: Four times a day (QID) | OROMUCOSAL | 1 refills | Status: DC

## 2017-02-10 NOTE — Telephone Encounter (Signed)
Pt called to say that he needs more medicine for sores in his mouth. I asked do they go away and come back or they stay all the time. He says it has never went all the way gone.  He states that his tongue is the problem with white patches. I spoke to [name] and she is ok with mycelex troche for him. I sent in 4 times a day for 15 days with refill. I called pt back and got his voicemail and told him that I sent in the mycelex like I spoke to him on the phone about this am and dr [name] is good with that. If he does not see improvement over 2-3 days to call me back.

## 2017-02-11 ENCOUNTER — Ambulatory Visit
Admission: RE | Admit: 2017-02-11 | Discharge: 2017-02-11 | Disposition: A | Discharge status: Home / Self Care | Payer: Medicare Other | Source: Ambulatory Visit | Attending: Urology | Admitting: Urology

## 2017-02-11 ENCOUNTER — Telehealth: Payer: Self-pay | Admitting: Urology

## 2017-02-11 DIAGNOSIS — C61 Malignant neoplasm of prostate: Secondary | ICD-10-CM | POA: Insufficient documentation

## 2017-02-11 DIAGNOSIS — Z436 Encounter for attention to other artificial openings of urinary tract: Secondary | ICD-10-CM | POA: Diagnosis present

## 2017-02-11 HISTORY — PX: IR NEPHROSTOMY EXCHANGE RIGHT: IMG6070

## 2017-02-11 HISTORY — PX: IR NEPHROSTOMY EXCHANGE LEFT: IMG6069

## 2017-02-11 MED ORDER — SODIUM CHLORIDE 0.9 % IV SOLN: INTRAVENOUS | Status: DC

## 2017-02-11 MED ORDER — IOPAMIDOL (ISOVUE-300) INJECTION 61%
30.0000 mL | Freq: Once | INTRAVENOUS | Status: AC | PRN
  Administered 2017-02-11: 09:00:00 10 mL

## 2017-02-11 NOTE — Procedures (Signed)
Prostate cancer, ureteral obstructions  s/p bilateral PCN exchgs No comp Stable EBL 0 FULL report in PACS

## 2017-02-11 NOTE — Telephone Encounter (Signed)
Case discussed with interventional radiologist today. There is decent drainage of both ureters into the bladder. Nephrostomy tubes are exchanged to return next week for capping trial of both nephrostomy tubes and have him recheck his creatinine approximate 3 days and 10 days after cap.  If Cr remains stable, will remove nephrostomy tubes.  Hollice Espy, MD

## 2017-02-11 NOTE — OR Nursing (Signed)
Following information shared with Dr Annamaria Boots. Pt states he would like to try tube change without moderate sedation. He reports the stat lock plastic tabs cause discomfort and make it hard to sleep. He reports that he is making voiding normally as well as via nephrostomy bags.

## 2017-02-11 NOTE — Telephone Encounter (Signed)
Lm for patient to cb to confirm appt  Johnathan Gould

## 2017-02-11 NOTE — Discharge Instructions (Signed)
Percutaneous Nephrostomy, Care After °This sheet gives you information about how to care for yourself after your procedure. Your health care provider may also give you more specific instructions. If you have problems or questions, contact your health care provider. °What can I expect after the procedure? °After the procedure, it is common to have: °· Some soreness where the nephrostomy tube was inserted (tube insertion site). °· Blood-tinged drainage from the nephrostomy tube for the first 24 hours. °Follow these instructions at home: °Activity °· Return to your normal activities as told by your health care provider. Ask your health care provider what activities are safe for you. °· Avoid activities that may cause the nephrostomy tubing to bend. °· Do not take baths, swim, or use a hot tub until your health care provider approves. Ask your health care provider if you can take showers. Cover the nephrostomy tube dressing with a watertight covering when you take a shower. °· Do not drive for 24 hours if you were given a medicine to help you relax (sedative). °Care of the tube insertion site °· Follow instructions from your health care provider about how to take care of your tube insertion site. Make sure you: °¨ Wash your hands with soap and water before you change your bandage (dressing). If soap and water are not available, use hand sanitizer. °¨ Change your dressing as told by your health care provider. Be careful not to pull on the tube while removing the dressing. °¨ When you change the dressing, wash the skin around the tube, rinse well, and pat the skin dry. °· Check the tube insertion area every day for signs of infection. Check for: °¨ More redness, swelling, or pain. °¨ More fluid or blood. °¨ Warmth. °¨ Pus or a bad smell. °Care of the nephrostomy tube and drainage bag °· Always keep the tubing, the leg bag, or the bedside drainage bags below the level of the kidney so that your urine drains freely. °· When  connecting your nephrostomy tube to a drainage bag, make sure that there are no kinks in the tubing and that your urine is draining freely. You may want to use an elastic bandage to wrap any exposed tubing that goes from the nephrostomy tube to any of the connecting tubes. °· At night, you may want to connect your nephrostomy tube or the leg bag to a larger bedside drainage bag. °· Follow instructions from your health care provider about how to empty or change the drainage bag. °· Empty the drainage bag when it becomes ? full. °· Replace the drainage bag and any extension tubing that is connected to your nephrostomy tube every 3 weeks or as often as told by your health care provider. Your health care provider will explain how to change the drainage bag and extension tubing. °General instructions °· Take over-the-counter and prescription medicines only as told by your health care provider. °· Keep all follow-up visits as told by your health care provider. This is important. °Contact a health care provider if: °· You have problems with any of the valves or tubing. °· You have persistent pain or soreness in your back. °· You have more redness, swelling, or pain around your tube insertion site. °· You have more fluid or blood coming from your tube insertion site. °· Your tube insertion site feels warm to the touch. °· You have pus or a bad smell coming from your tube insertion site. °· You have increased urine output or you feel burning   when urinating. °Get help right away if: °· You have pain in your abdomen during the first week. °· You have chest pain or have trouble breathing. °· You have a new appearance of blood in your urine. °· You have a fever or chills. °· You have back pain that is not relieved by your medicine. °· You have decreased urine output. °· Your nephrostomy tube comes out. °This information is not intended to replace advice given to you by your health care provider. Make sure you discuss any  questions you have with your health care provider. °Document Released: 06/10/2004 Document Revised: 07/30/2016 Document Reviewed: 07/30/2016 °Elsevier Interactive Patient Education © 2017 [employer] ° °

## 2017-02-15 ENCOUNTER — Ambulatory Visit (INDEPENDENT_AMBULATORY_CARE_PROVIDER_SITE_OTHER): Payer: Medicare Other

## 2017-02-15 ENCOUNTER — Inpatient Hospital Stay: Payer: Medicare Other

## 2017-02-15 VITALS — BP 161/83 | HR 64 | Ht 68.0 in | Wt 170.6 lb

## 2017-02-15 DIAGNOSIS — C61 Malignant neoplasm of prostate: Secondary | ICD-10-CM

## 2017-02-15 DIAGNOSIS — D631 Anemia in chronic kidney disease: Secondary | ICD-10-CM

## 2017-02-15 DIAGNOSIS — N139 Obstructive and reflux uropathy, unspecified: Secondary | ICD-10-CM | POA: Diagnosis not present

## 2017-02-15 DIAGNOSIS — C7951 Secondary malignant neoplasm of bone: Principal | ICD-10-CM

## 2017-02-15 DIAGNOSIS — G893 Neoplasm related pain (acute) (chronic): Secondary | ICD-10-CM

## 2017-02-15 DIAGNOSIS — N189 Chronic kidney disease, unspecified: Secondary | ICD-10-CM

## 2017-02-15 LAB — COMPREHENSIVE METABOLIC PANEL
ALBUMIN: 3.7 g/dL (ref 3.5–5.0)
ALK PHOS: 96 U/L (ref 38–126)
ALT: 15 U/L — ABNORMAL LOW (ref 17–63)
ANION GAP: 5 (ref 5–15)
AST: 18 U/L (ref 15–41)
BILIRUBIN TOTAL: 0.4 mg/dL (ref 0.3–1.2)
BUN: 33 mg/dL — AB (ref 6–20)
CALCIUM: 9 mg/dL (ref 8.9–10.3)
CO2: 31 mmol/L (ref 22–32)
CREATININE: 1.45 mg/dL — AB (ref 0.61–1.24)
Chloride: 97 mmol/L — ABNORMAL LOW (ref 101–111)
GFR calc Af Amer: 51 mL/min — ABNORMAL LOW (ref >60)
GFR calc non Af Amer: 44 mL/min — ABNORMAL LOW (ref >60)
GLUCOSE: 105 mg/dL — AB (ref 65–99)
Potassium: 4.1 mmol/L (ref 3.5–5.1)
SODIUM: 133 mmol/L — AB (ref 135–145)
Total Protein: 7.2 g/dL (ref 6.5–8.1)

## 2017-02-15 NOTE — Progress Notes (Signed)
Pt presented today to have nephrostomy tubes capped. Tubes were capped. Pt tolerated well. No s/s of adverse reaction noted. Pt made 3 and 10 day lab appts at check out.   Blood pressure (!) 161/83, pulse 64, height 5\' 8"  (1.727 m), weight 170 lb 9.6 oz (77.4 kg).

## 2017-02-18 ENCOUNTER — Other Ambulatory Visit: Payer: Medicare Other

## 2017-02-18 ENCOUNTER — Other Ambulatory Visit: Payer: Self-pay

## 2017-02-18 DIAGNOSIS — N139 Obstructive and reflux uropathy, unspecified: Secondary | ICD-10-CM

## 2017-02-19 LAB — BASIC METABOLIC PANEL
BUN / CREAT RATIO: 20 (ref 10–24)
BUN: 36 mg/dL — AB (ref 8–27)
CALCIUM: 9.2 mg/dL (ref 8.6–10.2)
CHLORIDE: 99 mmol/L (ref 96–106)
CO2: 28 mmol/L (ref 18–29)
Creatinine, Ser: 1.79 mg/dL — ABNORMAL HIGH (ref 0.76–1.27)
GFR calc Af Amer: 41 mL/min/{1.73_m2} — ABNORMAL LOW (ref >59)
GFR calc non Af Amer: 35 mL/min/{1.73_m2} — ABNORMAL LOW (ref >59)
GLUCOSE: 89 mg/dL (ref 65–99)
Potassium: 4.3 mmol/L (ref 3.5–5.2)
Sodium: 141 mmol/L (ref 134–144)

## 2017-02-21 ENCOUNTER — Telehealth: Payer: Self-pay

## 2017-02-21 ENCOUNTER — Other Ambulatory Visit: Payer: Medicare Other

## 2017-02-21 NOTE — Telephone Encounter (Signed)
-----   Message from Hollice Espy, MD sent at 02/20/2017 12:34 PM EDT ----- Renal function looks stable so far.  Please repeat lab next week as planned.  If your renal function stays stable, we will plan on removing your tubes.    Hollice Espy, MD

## 2017-02-21 NOTE — Telephone Encounter (Signed)
Spoke with pt in reference to lab results. Pt voiced understanding.  

## 2017-02-25 ENCOUNTER — Other Ambulatory Visit: Payer: Self-pay | Admitting: *Deleted

## 2017-02-25 ENCOUNTER — Other Ambulatory Visit: Payer: Self-pay

## 2017-02-25 ENCOUNTER — Other Ambulatory Visit: Payer: Medicare Other

## 2017-02-25 DIAGNOSIS — C61 Malignant neoplasm of prostate: Secondary | ICD-10-CM

## 2017-02-25 DIAGNOSIS — N139 Obstructive and reflux uropathy, unspecified: Secondary | ICD-10-CM

## 2017-02-25 DIAGNOSIS — C7951 Secondary malignant neoplasm of bone: Principal | ICD-10-CM

## 2017-02-25 MED ORDER — PREDNISONE 5 MG PO TABS: 5.0000 mg | ORAL_TABLET | Freq: Every day | ORAL | 0 refills | Status: DC

## 2017-02-25 MED ORDER — ABIRATERONE ACETATE 250 MG PO TABS: 1000.0000 mg | ORAL_TABLET | Freq: Every day | ORAL | 0 refills | Status: DC

## 2017-02-26 LAB — BASIC METABOLIC PANEL
BUN / CREAT RATIO: 17 (ref 10–24)
BUN: 26 mg/dL (ref 8–27)
CO2: 28 mmol/L (ref 18–29)
CREATININE: 1.56 mg/dL — AB (ref 0.76–1.27)
Calcium: 9.1 mg/dL (ref 8.6–10.2)
Chloride: 97 mmol/L (ref 96–106)
GFR, EST AFRICAN AMERICAN: 48 mL/min/{1.73_m2} — AB (ref >59)
GFR, EST NON AFRICAN AMERICAN: 42 mL/min/{1.73_m2} — AB (ref >59)
Glucose: 87 mg/dL (ref 65–99)
Potassium: 4 mmol/L (ref 3.5–5.2)
Sodium: 141 mmol/L (ref 134–144)

## 2017-02-28 ENCOUNTER — Telehealth: Payer: Self-pay

## 2017-02-28 ENCOUNTER — Ambulatory Visit (INDEPENDENT_AMBULATORY_CARE_PROVIDER_SITE_OTHER): Payer: Medicare Other

## 2017-02-28 VITALS — BP 152/83 | HR 84 | Ht 68.0 in | Wt 173.5 lb

## 2017-02-28 DIAGNOSIS — N139 Obstructive and reflux uropathy, unspecified: Secondary | ICD-10-CM | POA: Diagnosis not present

## 2017-02-28 NOTE — Telephone Encounter (Signed)
LMOM

## 2017-02-28 NOTE — Telephone Encounter (Signed)
-----   Message from Hollice Espy, MD sent at 02/26/2017  2:00 PM EDT ----- Lets get these tubes out!!!!!  Please have him come by the office Monday afternoon for tube removal.   Hollice Espy, MD

## 2017-02-28 NOTE — Progress Notes (Signed)
Pt presented today for neph tube removal. Pt tolerated well. Minimal drainage. No s/s of adverse reaction noted. Per Dr. Erlene Quan pt will RTC in 4 weeks for OV with her and labs. Pt voiced understanding. appt made at check out.  Blood pressure (!) 152/83, pulse 84, height 5\' 8"  (1.727 m), weight 173 lb 8 oz (78.7 kg).

## 2017-02-28 NOTE — Telephone Encounter (Signed)
Spoke with pt in reference to removing neph tubes. Pt stated he was on the way.

## 2017-03-01 ENCOUNTER — Inpatient Hospital Stay: Payer: Medicare Other | Attending: Oncology

## 2017-03-01 ENCOUNTER — Inpatient Hospital Stay (HOSPITAL_BASED_OUTPATIENT_CLINIC_OR_DEPARTMENT_OTHER): Payer: Medicare Other | Admitting: Oncology

## 2017-03-01 ENCOUNTER — Encounter: Payer: Self-pay | Admitting: Oncology

## 2017-03-01 VITALS — BP 134/80 | HR 73 | Temp 96.7°F | Resp 18 | Wt 172.8 lb

## 2017-03-01 DIAGNOSIS — I10 Essential (primary) hypertension: Secondary | ICD-10-CM

## 2017-03-01 DIAGNOSIS — Z87891 Personal history of nicotine dependence: Secondary | ICD-10-CM | POA: Diagnosis not present

## 2017-03-01 DIAGNOSIS — R599 Enlarged lymph nodes, unspecified: Secondary | ICD-10-CM | POA: Diagnosis not present

## 2017-03-01 DIAGNOSIS — G893 Neoplasm related pain (acute) (chronic): Secondary | ICD-10-CM

## 2017-03-01 DIAGNOSIS — Z923 Personal history of irradiation: Secondary | ICD-10-CM

## 2017-03-01 DIAGNOSIS — N329 Bladder disorder, unspecified: Secondary | ICD-10-CM | POA: Insufficient documentation

## 2017-03-01 DIAGNOSIS — I2510 Atherosclerotic heart disease of native coronary artery without angina pectoris: Secondary | ICD-10-CM

## 2017-03-01 DIAGNOSIS — C7951 Secondary malignant neoplasm of bone: Secondary | ICD-10-CM

## 2017-03-01 DIAGNOSIS — C61 Malignant neoplasm of prostate: Secondary | ICD-10-CM | POA: Insufficient documentation

## 2017-03-01 DIAGNOSIS — Z79899 Other long term (current) drug therapy: Secondary | ICD-10-CM | POA: Diagnosis not present

## 2017-03-01 DIAGNOSIS — D631 Anemia in chronic kidney disease: Secondary | ICD-10-CM

## 2017-03-01 DIAGNOSIS — N1330 Unspecified hydronephrosis: Secondary | ICD-10-CM

## 2017-03-01 DIAGNOSIS — N3289 Other specified disorders of bladder: Secondary | ICD-10-CM

## 2017-03-01 DIAGNOSIS — M545 Low back pain: Secondary | ICD-10-CM

## 2017-03-01 DIAGNOSIS — J45909 Unspecified asthma, uncomplicated: Secondary | ICD-10-CM | POA: Diagnosis not present

## 2017-03-01 DIAGNOSIS — Z7952 Long term (current) use of systemic steroids: Secondary | ICD-10-CM | POA: Diagnosis not present

## 2017-03-01 DIAGNOSIS — N189 Chronic kidney disease, unspecified: Secondary | ICD-10-CM

## 2017-03-01 LAB — COMPREHENSIVE METABOLIC PANEL
ALBUMIN: 3.5 g/dL (ref 3.5–5.0)
ALT: 14 U/L — ABNORMAL LOW (ref 17–63)
ANION GAP: 5 (ref 5–15)
AST: 18 U/L (ref 15–41)
Alkaline Phosphatase: 95 U/L (ref 38–126)
BILIRUBIN TOTAL: 0.3 mg/dL (ref 0.3–1.2)
BUN: 25 mg/dL — ABNORMAL HIGH (ref 6–20)
CO2: 32 mmol/L (ref 22–32)
Calcium: 9.3 mg/dL (ref 8.9–10.3)
Chloride: 99 mmol/L — ABNORMAL LOW (ref 101–111)
Creatinine, Ser: 1.47 mg/dL — ABNORMAL HIGH (ref 0.61–1.24)
GFR calc Af Amer: 51 mL/min — ABNORMAL LOW (ref >60)
GFR calc non Af Amer: 44 mL/min — ABNORMAL LOW (ref >60)
GLUCOSE: 107 mg/dL — AB (ref 65–99)
POTASSIUM: 4.1 mmol/L (ref 3.5–5.1)
SODIUM: 136 mmol/L (ref 135–145)
TOTAL PROTEIN: 7.1 g/dL (ref 6.5–8.1)

## 2017-03-01 LAB — CBC WITH DIFFERENTIAL/PLATELET
BASOS ABS: 0.1 10*3/uL (ref 0–0.1)
Basophils Relative: 1 %
Eosinophils Absolute: 0.1 10*3/uL (ref 0–0.7)
Eosinophils Relative: 1 %
HEMATOCRIT: 31.5 % — AB (ref 40.0–52.0)
HEMOGLOBIN: 10.5 g/dL — AB (ref 13.0–18.0)
LYMPHS PCT: 9 %
Lymphs Abs: 0.7 10*3/uL — ABNORMAL LOW (ref 1.0–3.6)
MCH: 28.9 pg (ref 26.0–34.0)
MCHC: 33.3 g/dL (ref 32.0–36.0)
MCV: 86.8 fL (ref 80.0–100.0)
MONO ABS: 0.3 10*3/uL (ref 0.2–1.0)
Monocytes Relative: 4 %
NEUTROS ABS: 6.1 10*3/uL (ref 1.4–6.5)
Neutrophils Relative %: 85 %
Platelets: 376 10*3/uL (ref 150–440)
RBC: 3.63 MIL/uL — AB (ref 4.40–5.90)
RDW: 15.3 % — AB (ref 11.5–14.5)
WBC: 7.2 10*3/uL (ref 3.8–10.6)

## 2017-03-01 LAB — PSA: PSA: 0.05 ng/mL (ref 0.00–4.00)

## 2017-03-01 MED ORDER — OXYCODONE HCL 10 MG PO TABS: 10.0000 mg | ORAL_TABLET | ORAL | 0 refills | Status: DC | PRN

## 2017-03-01 NOTE — Progress Notes (Signed)
Here for follow up. Needs refills of some medications per pt

## 2017-03-01 NOTE — Progress Notes (Signed)
Hematology/Oncology Consult note [facility]  Telephone:([phone] [phone]  Patient Care Team: No Pcp Per Patient as PCP - General (General Practice)   Name of the patient: Johnathan Gould  [id number]  [birth year]   Date of visit: 03/01/17  Diagnosis- 1. 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 Anemia of chronic kidney disease  Chief complaint/ Reason for visit- routine f/u  Heme/Onc history: patient is a 82 year old male was recently admitted to the hospital on 10/16/2016 with symptoms of left hip pain and renal failure. He was found to have bilateral hydronephrosis and is status post bilateral nephrostomy tube placement. CT abdomen showed an irregular polypoid mass at the posterior aspect of the bladder highly concerning for bladder carcinoma. Extensive bulky adenopathy throughout the abdomen and pelvis consistent with nodal metastases. Widespread osseous metastatic disease. Bone scan showed multifocal areas of increased activity throughout the pelvis, bilateral femurs, rib cage, right scapula, thoracic cervical and lumbar spine consistent with metastatic disease. Patient was found to have enlarged abnormal prostate as well as an elevated PSA of 469 and was presumptively diagnosed with metastatic prostate cancer. He was started on Firmagon by urology. He was also seen by radiation oncology and started palliative radiation to his left hip on 11/04/2016 and finished 10 doses of palliative Rt.  He is currently on monthly firmagon  4 core biopsy obtained for tissue diagnosis which showed: A. PROSTATIC ADENOCARCINOMA. GLEASON'S SCORE 10 (GRADES 5 + 5) NOTED IN 2  OUT OF 2 SUBMITTED PROSTATE CORE SEGMENTS. APPROXIMATELY 100% OF SUBMITTED  TISSUE INVOLVED. (GRADE GROUP 5)  B. PROSTATIC ADENOCARCINOMA. GLEASON'S SCORE 9 (GRADES 5 + 4) NOTED IN 1  OUT OF 1 SUBMITTED PROSTATE CORE SEGMENTS. APPROXIMATELY 100% OF SUBMITTED  TISSUE INVOLVED.  (GRADE GROUP 5)  C. PROSTATIC ADENOCARCINOMA. GLEASON'S SCORE 10 (GRADES 5 + 5) NOTED IN 1  OUT OF 1 SUBMITTED PROSTATE CORE SEGMENTS. APPROXIMATELY 100% OF SUBMITTED  TISSUE INVOLVED. (GRADE GROUP 5)  D. PROSTATIC ADENOCARCINOMA. GLEASON'S SCORE 10 (GRADES 5 + 5) NOTED IN 1  OUT OF 1 SUBMITTED PROSTATE CORE SEGMENTS. APPROXIMATELY 100% OF SUBMITTED  TISSUE INVOLVED. (GRADE GROUP 5)    Given high risk metastatic prostate cancer- patient was started on zytiga on March 3rd. PSA normalized from 469 to 0.09  Interval history- b/l nephrostomy tubes are out and he feels much better. Continues to have intermittent low back pain and burning for which he takes up to 2 doses of oxycodone per day. He has not required any fentanyl patches. Tolerating zytiga well. Reports some nausea. He is now ambulating without a cane  ECOG PS- 1   Review of systems- Review of Systems  Constitutional: Negative for chills, fever, malaise/fatigue and weight loss.  HENT: Negative for congestion, ear discharge and nosebleeds.   Eyes: Negative for blurred vision.  Respiratory: Negative for cough, hemoptysis, sputum production, shortness of breath and wheezing.   Cardiovascular: Negative for chest pain, palpitations, orthopnea and claudication.  Gastrointestinal: Negative for abdominal pain, blood in stool, constipation, diarrhea, heartburn, melena, nausea and vomiting.  Genitourinary: Negative for dysuria, flank pain, frequency, hematuria and urgency.  Musculoskeletal: Positive for back pain. Negative for joint pain and myalgias.  Skin: Negative for rash.  Neurological: Negative for dizziness, tingling, focal weakness, seizures, weakness and headaches.  Endo/Heme/Allergies: Does not bruise/bleed easily.  Psychiatric/Behavioral: Negative for depression and suicidal ideas. The patient does not have insomnia.      Current treatment- zytiga  No Known Allergies  Past Medical History:  Diagnosis Date  . CAD  (coronary artery disease)   . Cancer ([place])   . Hypertension   . RAD (reactive airway disease)      Past Surgical History:  Procedure Laterality Date  . CORONARY ANGIOPLASTY WITH STENT PLACEMENT    . IR GENERIC HISTORICAL  10/17/2016   IR NEPHROSTOMY PLACEMENT RIGHT 10/17/2016 [facility]INTERV RAD  . IR GENERIC HISTORICAL  10/17/2016   IR NEPHROSTOMY PLACEMENT LEFT 10/17/2016 [name], MD [facility]INTERV RAD  . IR GENERIC HISTORICAL  12/23/2016   IR NEPHROSTOMY EXCHANGE LEFT 12/23/2016 [facility]INTER[facility] RAD  . IR GENERIC HISTORICAL  12/23/2016   IR NEPHROSTOMY EXCHANGE RIGHT 12/23/2016 [facility]INTER[facility] RAD  . IR NEPHROSTOMY EXCHANGE LEFT  02/11/2017  . IR NEPHROSTOMY EXCHANGE RIGHT  02/11/2017  . KNEE SURGERY Left 2006   Laparascopy done on left knee, scar tissue taken out  . nephrostomy tubes Bilateral     Social History   Social History  . Marital status: Married    Spouse name: N/A  . Number of children: N/A  . Years of education: N/A   Occupational History  . Not on file.   Social History Main Topics  . Smoking status: Former Smoker    Packs/day: 1.00    Years: 69.50    Quit date: 11/04/2014  . Smokeless tobacco: Never Used  . Alcohol use No  . Drug use: No  . Sexual activity: Yes   Other Topics Concern  . Not on file   Social History Narrative  . No narrative on file    Family History  Problem Relation Age of Onset  . Hypertension Other   . Lung cancer Brother      Current Outpatient Prescriptions:  .  abiraterone Acetate (ZYTIGA) 250 MG tablet, Take 4 tablets (1,000 mg total) by mouth daily. Take on an empty stomach 1 hour before or 2 hours after a meal, Disp: 120 tablet, Rfl: 0 .  hydrochlorothiazide (HYDRODIURIL) 12.5 MG tablet, , Disp: , Rfl:  .  metoprolol (LOPRESSOR) 50 MG tablet, , Disp: , Rfl:  .  ondansetron (ZOFRAN) 4 MG tablet, Take 1 tablet (4 mg total) by mouth every 6 (six) hours as needed for nausea., Disp: 40 tablet, Rfl: 1 .  Oxycodone HCl 10 MG TABS,  Take 1 tablet (10 mg total) by mouth every 4 (four) hours as needed., Disp: 60 tablet, Rfl: 0 .  polyethylene glycol (MIRALAX / GLYCOLAX) packet, Take 17 g by mouth daily as needed. , Disp: , Rfl:  .  predniSONE (DELTASONE) 5 MG tablet, Take 1 tablet (5 mg total) by mouth daily with breakfast., Disp: 30 tablet, Rfl: 0 .  clotrimazole (MYCELEX) 10 MG troche, Take 1 lozenge (10 mg total) by mouth 4 (four) times daily. Until white patches resolved in mouth (Patient not taking: Reported on 03/01/2017), Disp: 70 lozenge, Rfl: 1 .  magic mouthwash SOLN, Take 5 mLs by mouth 4 (four) times daily as needed for mouth pain. (Patient not taking: Reported on 03/01/2017), Disp: 480 mL, Rfl: 0 .  nitroGLYCERIN (NITROSTAT) 0.4 MG SL tablet, Place 0.4 mg under the tongue every 5 (five) minutes x 3 doses as needed for chest pain., Disp: , Rfl:  .  SPS 15 GM/60ML suspension, Take 60 mLs by mouth daily., Disp: , Rfl:   Physical exam:  Vitals:   03/01/17 1042  BP: 134/80  Pulse: 73  Resp: 18  Temp: (!) 96.7 F (35.9 C)  TempSrc: Tympanic  Weight: 172 lb  12.8 oz (78.4 kg)   Physical Exam  Constitutional: He is oriented to person, place, and time and well-developed, well-nourished, and in no distress.  HENT:  Head: Normocephalic and atraumatic.  Eyes: EOM are normal. Pupils are equal, round, and reactive to light.  Neck: Normal range of motion.  Cardiovascular: Normal rate, regular rhythm and normal heart sounds.   Pulmonary/Chest: Effort normal and breath sounds normal.  Abdominal: Soft. Bowel sounds are normal.  Musculoskeletal: He exhibits edema (b/l +1 ankle edema).  Neurological: He is alert and oriented to person, place, and time.  Skin: Skin is warm and dry.     CMP Latest Ref Rng & Units 03/01/2017  Glucose 65 - 99 mg/dL 107(H)  BUN 6 - 20 mg/dL 25(H)  Creatinine 0.61 - 1.24 mg/dL 1.47(H)  Sodium 135 - 145 mmol/L 136  Potassium 3.5 - 5.1 mmol/L 4.1  Chloride 101 - 111 mmol/L 99(L)  CO2 22 - 32  mmol/L 32  Calcium 8.9 - 10.3 mg/dL 9.3  Total Protein 6.5 - 8.1 g/dL 7.1  Total Bilirubin 0.3 - 1.2 mg/dL 0.3  Alkaline Phos 38 - 126 U/L 95  AST 15 - 41 U/L 18  ALT 17 - 63 U/L 14(L)   CBC Latest Ref Rng & Units 03/01/2017  WBC 3.8 - 10.6 K/uL 7.2  Hemoglobin 13.0 - 18.0 g/dL 10.5(L)  Hematocrit 40.0 - 52.0 % 31.5(L)  Platelets 150 - 440 K/uL 376    No images are attached to the encounter.  Ir Nephrostomy Exchange Left  Result Date: 02/11/2017 INDICATION: Prostate cancer, bilateral hydroureteronephrosis, external nephrostomies EXAM: Fluoroscopic exchange of the bilateral nephrostomy catheters with antegrade nephrostograms COMPARISON:  12/23/2016 MEDICATIONS: 1% lidocaine locally ANESTHESIA/SEDATION: None. CONTRAST:  30 cc Isovue-300 - administered into the collecting system(s) FLUOROSCOPY TIME:  Fluoroscopy Time: 2 minutes 48 seconds (29 mGy). COMPLICATIONS: None immediate. PROCEDURE: Informed written consent was obtained from the patient after a thorough discussion of the procedural risks, benefits and alternatives. All questions were addressed. Maximal Sterile Barrier Technique was utilized including caps, mask, sterile gowns, sterile gloves, sterile drape, hand hygiene and skin antiseptic. A timeout was performed prior to the initiation of the procedure. Under sterile conditions , antegrade nephrostograms performed through both nephrostomy catheters. Mild bilateral hydroureteronephrosis but there is evidence of antegrade flow into the bladder bilaterally. Under sterile conditions and local anesthesia, the bilateral nephrostomy catheters removed over Amplatz guidewires. New 10 French nephrostomies advanced with the retention loops formed in the renal pelvis. Contrast injection confirms position of the nephrostomies. Catheters secured with Prolene sutures and connected to external gravity drainage. Sterile dressing applied. No immediate complications. IMPRESSION: Successful fluoroscopic exchange  of the 10 French bilateral nephrostomies. Bilateral antegrade nephrostograms confirm antegrade flow through both ureters into the bladder with mild hydroureteronephrosis. Electronically Signed   By: Jerilynn Mages.  Shick M.D.   On: 02/11/2017 10:47   Ir Nephrostomy Exchange Right  Result Date: 02/11/2017 INDICATION: Prostate cancer, bilateral hydroureteronephrosis, external nephrostomies EXAM: Fluoroscopic exchange of the bilateral nephrostomy catheters with antegrade nephrostograms COMPARISON:  12/23/2016 MEDICATIONS: 1% lidocaine locally ANESTHESIA/SEDATION: None. CONTRAST:  30 cc Isovue-300 - administered into the collecting system(s) FLUOROSCOPY TIME:  Fluoroscopy Time: 2 minutes 48 seconds (29 mGy). COMPLICATIONS: None immediate. PROCEDURE: Informed written consent was obtained from the patient after a thorough discussion of the procedural risks, benefits and alternatives. All questions were addressed. Maximal Sterile Barrier Technique was utilized including caps, mask, sterile gowns, sterile gloves, sterile drape, hand hygiene and skin antiseptic. A timeout was  performed prior to the initiation of the procedure. Under sterile conditions , antegrade nephrostograms performed through both nephrostomy catheters. Mild bilateral hydroureteronephrosis but there is evidence of antegrade flow into the bladder bilaterally. Under sterile conditions and local anesthesia, the bilateral nephrostomy catheters removed over Amplatz guidewires. New 10 French nephrostomies advanced with the retention loops formed in the renal pelvis. Contrast injection confirms position of the nephrostomies. Catheters secured with Prolene sutures and connected to external gravity drainage. Sterile dressing applied. No immediate complications. IMPRESSION: Successful fluoroscopic exchange of the 10 French bilateral nephrostomies. Bilateral antegrade nephrostograms confirm antegrade flow through both ureters into the bladder with mild  hydroureteronephrosis. Electronically Signed   By: Jerilynn Mages.  Shick M.D.   On: 02/11/2017 10:47     Assessment and plan- Patient is a 81 y.o. male with high risk castrate sensitive prostate cancer on zytiga  BP and lfts WNL. Tolerating zytiga well. He will get his 3rd cycle this month and we will repeat ct thorax abdomen without contrast and bone scan in about 7 weeks from now. He will continue to get lupron Q3 months. Next dose due in July 2018. Prn zofran for nausea  Neoplasm related pain- will renew his prn oxycodone today   Visit Diagnosis 1. Prostate cancer [facility])   2. Bladder mass      Dr. Randa Evens, MD, MPH [place] [facility] Pager- [phone] 03/01/2017 1:00 PM

## 2017-03-07 ENCOUNTER — Telehealth: Payer: Self-pay | Admitting: *Deleted

## 2017-03-07 ENCOUNTER — Other Ambulatory Visit: Payer: Self-pay | Admitting: Oncology

## 2017-03-07 NOTE — Telephone Encounter (Signed)
I have called and spoke to [employer] and I also called last Thursday and told them he was almost out he stated 3 left. And they had that he would run out 5/7 . I asked them to call his last week to check his status but when I called today they had not called him.  I told  Staff member that someone needs to call today because per his wife he is out.  I asked wife to call me tom. If [employer] does not call me.

## 2017-03-07 NOTE — Telephone Encounter (Signed)
Can you please check on this?

## 2017-03-07 NOTE — Telephone Encounter (Signed)
He has not received his Zytiga yet and is asking if we can check into why

## 2017-03-08 ENCOUNTER — Ambulatory Visit: Payer: Medicare Other

## 2017-03-11 ENCOUNTER — Encounter: Payer: Self-pay | Admitting: *Deleted

## 2017-03-15 ENCOUNTER — Inpatient Hospital Stay: Payer: Medicare Other

## 2017-03-24 ENCOUNTER — Telehealth: Payer: Self-pay | Admitting: *Deleted

## 2017-03-24 NOTE — Telephone Encounter (Signed)
Wife called to say that pt is itching. He has no rash just feels his skin is itchy and they ran into a family member of a patient that use to come here and they suggested triamcinolone.  She would like him to have something to keep from scratching and itching.

## 2017-03-24 NOTE — Telephone Encounter (Signed)
Called wife back and told her that the triamcinolone cream is used for rash mostly and not for itching. Suggested to drink lots of fluids to keep skim moist, add lotion for him every day and try benadryl to see if that helps with his itching. She says he drinks all the time, she puts lotion on him sometimes and she will try the benadryl.

## 2017-03-29 ENCOUNTER — Inpatient Hospital Stay: Payer: Medicare Other

## 2017-03-29 DIAGNOSIS — D631 Anemia in chronic kidney disease: Secondary | ICD-10-CM

## 2017-03-29 DIAGNOSIS — C7951 Secondary malignant neoplasm of bone: Principal | ICD-10-CM

## 2017-03-29 DIAGNOSIS — G893 Neoplasm related pain (acute) (chronic): Secondary | ICD-10-CM

## 2017-03-29 DIAGNOSIS — N189 Chronic kidney disease, unspecified: Secondary | ICD-10-CM

## 2017-03-29 DIAGNOSIS — C61 Malignant neoplasm of prostate: Secondary | ICD-10-CM | POA: Diagnosis not present

## 2017-03-29 LAB — COMPREHENSIVE METABOLIC PANEL
ALK PHOS: 93 U/L (ref 38–126)
ALT: 12 U/L — AB (ref 17–63)
AST: 19 U/L (ref 15–41)
Albumin: 3.7 g/dL (ref 3.5–5.0)
Anion gap: 8 (ref 5–15)
BUN: 21 mg/dL — AB (ref 6–20)
CALCIUM: 9.4 mg/dL (ref 8.9–10.3)
CO2: 30 mmol/L (ref 22–32)
CREATININE: 1.58 mg/dL — AB (ref 0.61–1.24)
Chloride: 101 mmol/L (ref 101–111)
GFR calc non Af Amer: 40 mL/min — ABNORMAL LOW (ref >60)
GFR, EST AFRICAN AMERICAN: 46 mL/min — AB (ref >60)
GLUCOSE: 98 mg/dL (ref 65–99)
Potassium: 4.1 mmol/L (ref 3.5–5.1)
SODIUM: 139 mmol/L (ref 135–145)
Total Bilirubin: 0.4 mg/dL (ref 0.3–1.2)
Total Protein: 7 g/dL (ref 6.5–8.1)

## 2017-03-31 ENCOUNTER — Encounter: Payer: Self-pay | Admitting: Urology

## 2017-03-31 ENCOUNTER — Ambulatory Visit (INDEPENDENT_AMBULATORY_CARE_PROVIDER_SITE_OTHER): Payer: Medicare Other | Admitting: Urology

## 2017-03-31 VITALS — BP 127/67 | HR 79 | Ht 68.0 in | Wt 170.0 lb

## 2017-03-31 DIAGNOSIS — N139 Obstructive and reflux uropathy, unspecified: Secondary | ICD-10-CM

## 2017-03-31 DIAGNOSIS — C61 Malignant neoplasm of prostate: Secondary | ICD-10-CM | POA: Diagnosis not present

## 2017-03-31 DIAGNOSIS — F419 Anxiety disorder, unspecified: Secondary | ICD-10-CM | POA: Diagnosis not present

## 2017-03-31 DIAGNOSIS — N183 Chronic kidney disease, stage 3 unspecified: Secondary | ICD-10-CM

## 2017-03-31 NOTE — Progress Notes (Signed)
03/31/2017 11:21 AM   Johnathan Gould Mar 19, 1935 [id number]  Referring provider: No referring provider defined for this encounter.  Chief Complaint  Patient presents with  . Follow-up    4wk     HPI: 82 year old male with with metastatic prostate cancer who returns today for routine follow-up.  He initially presented to the emergency room on 10/16/2016 with severe left hip pain and renal failure. He is on bilateral hydronephrosis, extensive bulky adenopathy threat the abdomen pelvis, locally advanced prostate cancer, and widespread osseous metastatic disease. At the time of presentation his PSA was 469. He underwent placement of bilateral nephrostomy tubes and his creatinine improved.  He is now on ADT and has been seen and evaluated by Dr. [name] who has started him on the Zytiga/ prednisone in addition to lupron.    He did also receive 10 fractions of palliative radiation to his right hip.  Most recent PSA 0.05 down from 469.  Most recently, his nephrostomy tubes were capped for a week and ultimately removed on 02/28/2017. During the clamp trial, he is able to void spontaneously and his creatinine remained stable.  Most recent creatinine 1.58 on 03/29/2017 which appears to be his new baseline.  He is scheduled for his next cross-sectional imaging in the form of CT abdomen and pelvis without contrast on 04/15/2017.  Overall, he's doing very well. He has trouble sleeping at night related to anxiety and hot flashes.  PMH: Past Medical History:  Diagnosis Date  . CAD (coronary artery disease)   . Cancer ([place])   . Hypertension   . RAD (reactive airway disease)     Surgical History: Past Surgical History:  Procedure Laterality Date  . CORONARY ANGIOPLASTY WITH STENT PLACEMENT    . IR GENERIC HISTORICAL  10/17/2016   IR NEPHROSTOMY PLACEMENT RIGHT 10/17/2016 [facility]  . IR GENERIC HISTORICAL  10/17/2016   IR NEPHROSTOMY PLACEMENT LEFT 10/17/2016 [name], MD  [facility]  . IR GENERIC HISTORICAL  12/23/2016   IR NEPHROSTOMY EXCHANGE LEFT 12/23/2016 [facility]  . IR GENERIC HISTORICAL  12/23/2016   IR NEPHROSTOMY EXCHANGE RIGHT 12/23/2016 [facility]  . IR NEPHROSTOMY EXCHANGE LEFT  02/11/2017  . IR NEPHROSTOMY EXCHANGE RIGHT  02/11/2017  . KNEE SURGERY Left 2006   Laparascopy done on left knee, scar tissue taken out  . nephrostomy tubes Bilateral     Home Medications:  Allergies as of 03/31/2017   No Known Allergies     Medication List       Accurate as of 03/31/17 11:21 AM. Always use your most recent med list.          abiraterone Acetate 250 MG tablet Commonly known as:  ZYTIGA Take 4 tablets (1,000 mg total) by mouth daily. Take on an empty stomach 1 hour before or 2 hours after a meal   clotrimazole 10 MG troche Commonly known as:  MYCELEX Take 1 lozenge (10 mg total) by mouth 4 (four) times daily. Until white patches resolved in mouth   hydrochlorothiazide 12.5 MG tablet Commonly known as:  HYDRODIURIL   magic mouthwash Soln Take 5 mLs by mouth 4 (four) times daily as needed for mouth pain.   metoprolol tartrate 50 MG tablet Commonly known as:  LOPRESSOR   nitroGLYCERIN 0.4 MG SL tablet Commonly known as:  NITROSTAT Place 0.4 mg under the tongue every 5 (five) minutes x 3 doses as needed for chest pain.   ondansetron 4 MG tablet Commonly known as:  ZOFRAN  TAKE 1 TABLET BY MOUTH EVERY 6 HOURS AS NEEDED FOR NAUSEA   Oxycodone HCl 10 MG Tabs Take 1 tablet (10 mg total) by mouth every 4 (four) hours as needed.   polyethylene glycol packet Commonly known as:  MIRALAX / GLYCOLAX Take 17 g by mouth daily as needed.   predniSONE 5 MG tablet Commonly known as:  DELTASONE Take 1 tablet (5 mg total) by mouth daily with breakfast.   SPS 15 GM/60ML suspension Generic drug:  sodium polystyrene Take 60 mLs by mouth daily.       Allergies: No Known Allergies  Family History: Family History  Problem  Relation Age of Onset  . Hypertension Other   . Lung cancer Brother     Social History:  reports that he quit smoking about 2 years ago. He has a 69.50 pack-year smoking history. He has never used smokeless tobacco. He reports that he does not drink alcohol or use drugs.  ROS: UROLOGY Frequent Urination?: No Hard to postpone urination?: No Burning/pain with urination?: No Get up at night to urinate?: No Leakage of urine?: No Urine stream starts and stops?: No Trouble starting stream?: No Do you have to strain to urinate?: No Blood in urine?: No Urinary tract infection?: No Sexually transmitted disease?: No Injury to kidneys or bladder?: No Painful intercourse?: No Weak stream?: No Erection problems?: No Penile pain?: No  Gastrointestinal Nausea?: No Vomiting?: No Indigestion/heartburn?: No Diarrhea?: No Constipation?: No  Constitutional Fever: No Night sweats?: No Weight loss?: No Fatigue?: No  Skin Skin rash/lesions?: No Itching?: Yes  Eyes Blurred vision?: No Double vision?: No  Ears/Nose/Throat Sore throat?: No Sinus problems?: Yes  Hematologic/Lymphatic Swollen glands?: No Easy bruising?: No  Cardiovascular Leg swelling?: No Chest pain?: No  Respiratory Cough?: No Shortness of breath?: No  Endocrine Excessive thirst?: No  Musculoskeletal Back pain?: No Joint pain?: No  Neurological Headaches?: No Dizziness?: No  Psychologic Depression?: No Anxiety?: No  Physical Exam: BP 127/67   Pulse 79   Ht 5\' 8"  (1.727 m)   Wt 170 lb (77.1 kg)   BMI 25.85 kg/m   Constitutional:  Alert and oriented, No acute distress.  Wife present today. HEENT: [name], moist mucus membranes.  Trachea midline, no masses. Cardiovascular: No clubbing, cyanosis, or edema. Respiratory: Normal respiratory effort, no increased work of breathing. GI: Abdomen is soft, nontender, nondistended, no abdominal masses GU: Bilateral nephrostomy sites healing  well. Skin: No rashes, bruises or suspicious lesions. Neurologic: Grossly intact, no focal deficits, moving all 4 extremities. Psychiatric: Normal mood and affect.  Laboratory Data: Lab Results  Component Value Date   WBC 7.2 03/01/2017   HGB 10.5 (L) 03/01/2017   HCT 31.5 (L) 03/01/2017   MCV 86.8 03/01/2017   PLT 376 03/01/2017    Lab Results  Component Value Date   CREATININE 1.58 (H) 03/29/2017    Lab Results  Component Value Date   PSA 0.05 03/01/2017   PSA 0.09 02/01/2017   PSA 0.09 01/18/2017    Lab Results  Component Value Date   TESTOSTERONE <3 (L) 01/18/2017    Urinalysis N/a  Pertinent Imaging: No new imaging  Assessment & Plan:    1. Prostate cancer ([place]) On Lupron/ Zytiga, prednisone managed by Dr. [name] PSA nearly undetectable  2. Urinary obstruction Bilateral ureteral obstruction secondary to adenopathy and obstructing pelvic tumor Biochemical response to ADT is excellent s/p successfully clamp trial with subsequent nephrostomy tube removal  3. CKD Suspect new baseline, will remain  stable We'll continue to monitor, serial labs by Dr. [name], we will send her note to let me know if his creatinine starts to rise again We will review upcoming CT scan to assess for any recurrence of hydronephrosis or ureteral obstruction  4. Anxiety Briefly discussed anxiety issues, advised to follow up with either Dr. [name] or his PCP to discuss this further  Return in about 6 months (around 09/30/2017) for recheck prostate cancer.  Hollice Espy, MD  [facility] [address] [place], [place] [address] [phone]

## 2017-04-12 ENCOUNTER — Inpatient Hospital Stay: Payer: Medicare Other | Attending: Oncology

## 2017-04-12 ENCOUNTER — Other Ambulatory Visit: Payer: Self-pay | Admitting: *Deleted

## 2017-04-12 DIAGNOSIS — R300 Dysuria: Secondary | ICD-10-CM | POA: Insufficient documentation

## 2017-04-12 DIAGNOSIS — N189 Chronic kidney disease, unspecified: Secondary | ICD-10-CM | POA: Diagnosis not present

## 2017-04-12 DIAGNOSIS — Z87891 Personal history of nicotine dependence: Secondary | ICD-10-CM | POA: Diagnosis not present

## 2017-04-12 DIAGNOSIS — C7951 Secondary malignant neoplasm of bone: Secondary | ICD-10-CM | POA: Diagnosis not present

## 2017-04-12 DIAGNOSIS — I10 Essential (primary) hypertension: Secondary | ICD-10-CM | POA: Insufficient documentation

## 2017-04-12 DIAGNOSIS — M545 Low back pain: Secondary | ICD-10-CM | POA: Diagnosis not present

## 2017-04-12 DIAGNOSIS — Z79899 Other long term (current) drug therapy: Secondary | ICD-10-CM | POA: Diagnosis not present

## 2017-04-12 DIAGNOSIS — I700 Atherosclerosis of aorta: Secondary | ICD-10-CM | POA: Insufficient documentation

## 2017-04-12 DIAGNOSIS — C779 Secondary and unspecified malignant neoplasm of lymph node, unspecified: Secondary | ICD-10-CM | POA: Diagnosis not present

## 2017-04-12 DIAGNOSIS — C61 Malignant neoplasm of prostate: Secondary | ICD-10-CM | POA: Diagnosis not present

## 2017-04-12 DIAGNOSIS — R590 Localized enlarged lymph nodes: Secondary | ICD-10-CM | POA: Insufficient documentation

## 2017-04-12 DIAGNOSIS — I129 Hypertensive chronic kidney disease with stage 1 through stage 4 chronic kidney disease, or unspecified chronic kidney disease: Secondary | ICD-10-CM | POA: Diagnosis not present

## 2017-04-12 DIAGNOSIS — N3289 Other specified disorders of bladder: Secondary | ICD-10-CM

## 2017-04-12 DIAGNOSIS — J45909 Unspecified asthma, uncomplicated: Secondary | ICD-10-CM | POA: Insufficient documentation

## 2017-04-12 DIAGNOSIS — I2510 Atherosclerotic heart disease of native coronary artery without angina pectoris: Secondary | ICD-10-CM | POA: Diagnosis not present

## 2017-04-12 DIAGNOSIS — G893 Neoplasm related pain (acute) (chronic): Secondary | ICD-10-CM | POA: Diagnosis not present

## 2017-04-12 LAB — COMPREHENSIVE METABOLIC PANEL
ALT: 12 U/L — ABNORMAL LOW (ref 17–63)
ANION GAP: 9 (ref 5–15)
AST: 17 U/L (ref 15–41)
Albumin: 3.8 g/dL (ref 3.5–5.0)
Alkaline Phosphatase: 89 U/L (ref 38–126)
BUN: 31 mg/dL — ABNORMAL HIGH (ref 6–20)
CHLORIDE: 100 mmol/L — AB (ref 101–111)
CO2: 29 mmol/L (ref 22–32)
Calcium: 9.4 mg/dL (ref 8.9–10.3)
Creatinine, Ser: 1.83 mg/dL — ABNORMAL HIGH (ref 0.61–1.24)
GFR calc non Af Amer: 33 mL/min — ABNORMAL LOW (ref >60)
GFR, EST AFRICAN AMERICAN: 39 mL/min — AB (ref >60)
Glucose, Bld: 94 mg/dL (ref 65–99)
POTASSIUM: 3.8 mmol/L (ref 3.5–5.1)
SODIUM: 138 mmol/L (ref 135–145)
Total Bilirubin: 0.5 mg/dL (ref 0.3–1.2)
Total Protein: 7 g/dL (ref 6.5–8.1)

## 2017-04-12 LAB — CBC WITH DIFFERENTIAL/PLATELET
Basophils Absolute: 0 10*3/uL (ref 0–0.1)
Basophils Relative: 0 %
EOS ABS: 0.3 10*3/uL (ref 0–0.7)
EOS PCT: 5 %
HCT: 33.4 % — ABNORMAL LOW (ref 40.0–52.0)
Hemoglobin: 11.3 g/dL — ABNORMAL LOW (ref 13.0–18.0)
LYMPHS ABS: 0.9 10*3/uL — AB (ref 1.0–3.6)
Lymphocytes Relative: 17 %
MCH: 29.3 pg (ref 26.0–34.0)
MCHC: 33.8 g/dL (ref 32.0–36.0)
MCV: 86.9 fL (ref 80.0–100.0)
MONOS PCT: 9 %
Monocytes Absolute: 0.5 10*3/uL (ref 0.2–1.0)
Neutro Abs: 3.8 10*3/uL (ref 1.4–6.5)
Neutrophils Relative %: 69 %
PLATELETS: 300 10*3/uL (ref 150–440)
RBC: 3.84 MIL/uL — AB (ref 4.40–5.90)
RDW: 16.4 % — AB (ref 11.5–14.5)
WBC: 5.5 10*3/uL (ref 3.8–10.6)

## 2017-04-12 LAB — PSA: Prostatic Specific Antigen: 0.02 ng/mL (ref 0.00–4.00)

## 2017-04-12 MED ORDER — OXYCODONE HCL 10 MG PO TABS: 10.0000 mg | ORAL_TABLET | ORAL | 0 refills | Status: DC | PRN

## 2017-04-13 LAB — TESTOSTERONE

## 2017-04-15 ENCOUNTER — Encounter
Admission: RE | Admit: 2017-04-15 | Discharge: 2017-04-15 | Disposition: A | Discharge status: Home / Self Care | Payer: Medicare Other | Source: Ambulatory Visit | Attending: Oncology | Admitting: Oncology

## 2017-04-15 ENCOUNTER — Ambulatory Visit: Admission: RE | Admit: 2017-04-15 | Payer: Medicare Other | Source: Ambulatory Visit

## 2017-04-15 ENCOUNTER — Ambulatory Visit: Payer: Medicare Other

## 2017-04-15 ENCOUNTER — Ambulatory Visit
Admission: RE | Admit: 2017-04-15 | Discharge: 2017-04-15 | Disposition: A | Discharge status: Home / Self Care | Payer: Medicare Other | Source: Ambulatory Visit | Attending: Oncology | Admitting: Oncology

## 2017-04-15 DIAGNOSIS — C61 Malignant neoplasm of prostate: Secondary | ICD-10-CM

## 2017-04-15 DIAGNOSIS — N3289 Other specified disorders of bladder: Secondary | ICD-10-CM | POA: Diagnosis present

## 2017-04-15 DIAGNOSIS — C7951 Secondary malignant neoplasm of bone: Secondary | ICD-10-CM | POA: Diagnosis not present

## 2017-04-15 DIAGNOSIS — R590 Localized enlarged lymph nodes: Secondary | ICD-10-CM | POA: Insufficient documentation

## 2017-04-15 DIAGNOSIS — I2510 Atherosclerotic heart disease of native coronary artery without angina pectoris: Secondary | ICD-10-CM | POA: Diagnosis not present

## 2017-04-15 DIAGNOSIS — I700 Atherosclerosis of aorta: Secondary | ICD-10-CM | POA: Insufficient documentation

## 2017-04-15 MED ORDER — TECHNETIUM TC 99M MEDRONATE IV KIT
25.0000 | PACK | Freq: Once | INTRAVENOUS | Status: AC | PRN
  Administered 2017-04-15: 24 via INTRAVENOUS

## 2017-04-20 ENCOUNTER — Ambulatory Visit
Admission: RE | Admit: 2017-04-20 | Discharge: 2017-04-20 | Disposition: A | Discharge status: Home / Self Care | Payer: Medicare Other | Source: Ambulatory Visit | Attending: Oncology | Admitting: Oncology

## 2017-04-20 DIAGNOSIS — M8588 Other specified disorders of bone density and structure, other site: Secondary | ICD-10-CM | POA: Diagnosis not present

## 2017-04-20 DIAGNOSIS — C61 Malignant neoplasm of prostate: Secondary | ICD-10-CM | POA: Diagnosis present

## 2017-04-20 DIAGNOSIS — C7951 Secondary malignant neoplasm of bone: Secondary | ICD-10-CM | POA: Insufficient documentation

## 2017-04-20 DIAGNOSIS — N189 Chronic kidney disease, unspecified: Secondary | ICD-10-CM | POA: Insufficient documentation

## 2017-04-20 DIAGNOSIS — G893 Neoplasm related pain (acute) (chronic): Secondary | ICD-10-CM | POA: Diagnosis not present

## 2017-04-20 DIAGNOSIS — D631 Anemia in chronic kidney disease: Secondary | ICD-10-CM | POA: Insufficient documentation

## 2017-04-21 ENCOUNTER — Other Ambulatory Visit: Payer: Self-pay

## 2017-04-21 ENCOUNTER — Encounter: Payer: Self-pay | Admitting: Oncology

## 2017-04-21 ENCOUNTER — Inpatient Hospital Stay (HOSPITAL_BASED_OUTPATIENT_CLINIC_OR_DEPARTMENT_OTHER): Payer: Medicare Other | Admitting: Oncology

## 2017-04-21 ENCOUNTER — Other Ambulatory Visit: Payer: Self-pay | Admitting: *Deleted

## 2017-04-21 ENCOUNTER — Telehealth: Payer: Self-pay | Admitting: *Deleted

## 2017-04-21 ENCOUNTER — Inpatient Hospital Stay: Payer: Medicare Other

## 2017-04-21 VITALS — BP 126/77 | HR 69 | Temp 98.6°F | Resp 18 | Wt 180.9 lb

## 2017-04-21 DIAGNOSIS — G893 Neoplasm related pain (acute) (chronic): Secondary | ICD-10-CM | POA: Diagnosis not present

## 2017-04-21 DIAGNOSIS — Z87891 Personal history of nicotine dependence: Secondary | ICD-10-CM

## 2017-04-21 DIAGNOSIS — C7951 Secondary malignant neoplasm of bone: Secondary | ICD-10-CM

## 2017-04-21 DIAGNOSIS — I2510 Atherosclerotic heart disease of native coronary artery without angina pectoris: Secondary | ICD-10-CM | POA: Diagnosis not present

## 2017-04-21 DIAGNOSIS — C61 Malignant neoplasm of prostate: Secondary | ICD-10-CM

## 2017-04-21 DIAGNOSIS — N189 Chronic kidney disease, unspecified: Secondary | ICD-10-CM

## 2017-04-21 DIAGNOSIS — I700 Atherosclerosis of aorta: Secondary | ICD-10-CM

## 2017-04-21 DIAGNOSIS — I129 Hypertensive chronic kidney disease with stage 1 through stage 4 chronic kidney disease, or unspecified chronic kidney disease: Secondary | ICD-10-CM | POA: Diagnosis not present

## 2017-04-21 DIAGNOSIS — I10 Essential (primary) hypertension: Secondary | ICD-10-CM | POA: Diagnosis not present

## 2017-04-21 DIAGNOSIS — R300 Dysuria: Secondary | ICD-10-CM

## 2017-04-21 DIAGNOSIS — Z79899 Other long term (current) drug therapy: Secondary | ICD-10-CM

## 2017-04-21 DIAGNOSIS — C779 Secondary and unspecified malignant neoplasm of lymph node, unspecified: Secondary | ICD-10-CM

## 2017-04-21 DIAGNOSIS — R590 Localized enlarged lymph nodes: Secondary | ICD-10-CM

## 2017-04-21 DIAGNOSIS — M810 Age-related osteoporosis without current pathological fracture: Secondary | ICD-10-CM

## 2017-04-21 DIAGNOSIS — J45909 Unspecified asthma, uncomplicated: Secondary | ICD-10-CM

## 2017-04-21 DIAGNOSIS — M545 Low back pain: Secondary | ICD-10-CM

## 2017-04-21 LAB — URINALYSIS, COMPLETE (UACMP) WITH MICROSCOPIC
BILIRUBIN URINE: NEGATIVE
Glucose, UA: NEGATIVE mg/dL
HGB URINE DIPSTICK: NEGATIVE
KETONES UR: NEGATIVE mg/dL
NITRITE: NEGATIVE
PH: 5 (ref 5.0–8.0)
Protein, ur: NEGATIVE mg/dL
SPECIFIC GRAVITY, URINE: 1.013 (ref 1.005–1.030)
SQUAMOUS EPITHELIAL / LPF: NONE SEEN

## 2017-04-21 MED ORDER — AMOXICILLIN-POT CLAVULANATE 875-125 MG PO TABS: 1.0000 | ORAL_TABLET | Freq: Two times a day (BID) | ORAL | 0 refills | Status: DC

## 2017-04-21 MED ORDER — ONDANSETRON HCL 4 MG PO TABS: 4.0000 mg | ORAL_TABLET | Freq: Four times a day (QID) | ORAL | 1 refills | Status: DC | PRN

## 2017-04-21 NOTE — Progress Notes (Signed)
Hematology/Oncology Consult note [facility]  Telephone:([phone] [phone]  Patient Care Team: Patient, No Pcp Per as PCP - General (General Practice)   Name of the patient: Johnathan Gould  [id number]  07-13-1935   Date of visit: 04/21/17  Diagnosis- 1. 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 Anemia of chronic kidney disease  Chief complaint/ Reason for visit- routine f/u  Heme/Onc history: patient is a 82 year old male was recently admitted to the hospital on 10/16/2016 with symptoms of left hip pain and renal failure. He was found to have bilateral hydronephrosis and is status post bilateral nephrostomy tube placement. CT abdomen showed an irregular polypoid mass at the posterior aspect of the bladder highly concerning for bladder carcinoma. Extensive bulky adenopathy throughout the abdomen and pelvis consistent with nodal metastases. Widespread osseous metastatic disease. Bone scan showed multifocal areas of increased activity throughout the pelvis, bilateral femurs, rib cage, right scapula, thoracic cervical and lumbar spine consistent with metastatic disease. Patient was found to have enlarged abnormal prostate as well as an elevated PSA of 469 and was presumptively diagnosed with metastatic prostate cancer. He was started on Firmagon by urology. He was also seen by radiation oncology and started palliative radiation to his left hip on 11/04/2016 and finished 10 doses of palliative Rt.  He is currently on lupron through urology  4 core biopsy obtained for tissue diagnosis which showed: prostate adenocarcinoma gleasons 9 and 10 in all 4 cores   Interval history- He tried to come off oxycodone but his low back and hip pain restarted. Reports difficulty voiding since his scans were done. Has occasional nausea and trouble sleeping  ECOG PS- 1 Pain scale- 4 Opioid associated constipation- no  Review of systems- Review of Systems    Constitutional: Positive for malaise/fatigue. Negative for chills, fever and weight loss.  HENT: Negative for congestion, ear discharge and nosebleeds.   Eyes: Negative for blurred vision.  Respiratory: Negative for cough, hemoptysis, sputum production, shortness of breath and wheezing.   Cardiovascular: Negative for chest pain, palpitations, orthopnea and claudication.  Gastrointestinal: Positive for nausea. Negative for abdominal pain, blood in stool, constipation, diarrhea, heartburn, melena and vomiting.  Genitourinary: Negative for dysuria, flank pain, frequency, hematuria and urgency.  Musculoskeletal: Positive for back pain. Negative for joint pain and myalgias.  Skin: Negative for rash.  Neurological: Negative for dizziness, tingling, focal weakness, seizures, weakness and headaches.  Endo/Heme/Allergies: Does not bruise/bleed easily.  Psychiatric/Behavioral: Negative for depression and suicidal ideas. The patient has insomnia.      No Known Allergies   Past Medical History:  Diagnosis Date  . CAD (coronary artery disease)   . Cancer ([place])   . Hypertension   . RAD (reactive airway disease)      Past Surgical History:  Procedure Laterality Date  . CORONARY ANGIOPLASTY WITH STENT PLACEMENT    . IR GENERIC HISTORICAL  10/17/2016   IR NEPHROSTOMY PLACEMENT RIGHT 10/17/2016 [facility]INTER[facility] RAD  . IR GENERIC HISTORICAL  10/17/2016   IR NEPHROSTOMY PLACEMENT LEFT 10/17/2016 [name], MD [facility]  . IR GENERIC HISTORICAL  12/23/2016   IR NEPHROSTOMY EXCHANGE LEFT 12/23/2016 [facility]  . IR GENERIC HISTORICAL  12/23/2016   IR NEPHROSTOMY EXCHANGE RIGHT 12/23/2016 [facility]  . IR NEPHROSTOMY EXCHANGE LEFT  02/11/2017  . IR NEPHROSTOMY EXCHANGE RIGHT  02/11/2017  . KNEE SURGERY Left 2006   Laparascopy done on left knee, scar tissue taken out  . nephrostomy tubes Bilateral  Social History   Social History  . Marital status: Married    Spouse name: N/A   . Number of children: N/A  . Years of education: N/A   Occupational History  . Not on file.   Social History Main Topics  . Smoking status: Former Smoker    Packs/day: 1.00    Years: 69.50    Quit date: 11/04/2014  . Smokeless tobacco: Never Used  . Alcohol use No  . Drug use: No  . Sexual activity: Yes   Other Topics Concern  . Not on file   Social History Narrative  . No narrative on file    Family History  Problem Relation Age of Onset  . Hypertension Other   . Lung cancer Brother      Current Outpatient Prescriptions:  .  abiraterone Acetate (ZYTIGA) 250 MG tablet, Take 4 tablets (1,000 mg total) by mouth daily. Take on an empty stomach 1 hour before or 2 hours after a meal, Disp: 120 tablet, Rfl: 0 .  hydrochlorothiazide (HYDRODIURIL) 12.5 MG tablet, , Disp: , Rfl:  .  metoprolol (LOPRESSOR) 50 MG tablet, , Disp: , Rfl:  .  Oxycodone HCl 10 MG TABS, Take 1 tablet (10 mg total) by mouth every 4 (four) hours as needed., Disp: 60 tablet, Rfl: 0 .  predniSONE (DELTASONE) 5 MG tablet, Take 1 tablet (5 mg total) by mouth daily with breakfast., Disp: 30 tablet, Rfl: 0 .  amoxicillin-clavulanate (AUGMENTIN) 875-125 MG tablet, Take 1 tablet by mouth 2 (two) times daily., Disp: 10 tablet, Rfl: 0 .  clotrimazole (MYCELEX) 10 MG troche, Take 1 lozenge (10 mg total) by mouth 4 (four) times daily. Until white patches resolved in mouth (Patient not taking: Reported on 04/21/2017), Disp: 70 lozenge, Rfl: 1 .  magic mouthwash SOLN, Take 5 mLs by mouth 4 (four) times daily as needed for mouth pain. (Patient not taking: Reported on 04/21/2017), Disp: 480 mL, Rfl: 0 .  nitroGLYCERIN (NITROSTAT) 0.4 MG SL tablet, Place 0.4 mg under the tongue every 5 (five) minutes x 3 doses as needed for chest pain., Disp: , Rfl:  .  ondansetron (ZOFRAN) 4 MG tablet, Take 1 tablet (4 mg total) by mouth every 6 (six) hours as needed. for nausea, Disp: 40 tablet, Rfl: 1 .  polyethylene glycol (MIRALAX /  GLYCOLAX) packet, Take 17 g by mouth daily as needed. , Disp: , Rfl:  .  SPS 15 GM/60ML suspension, Take 60 mLs by mouth daily., Disp: , Rfl:   Physical exam:  Vitals:   04/21/17 1007  BP: 126/77  Pulse: 69  Resp: 18  Temp: 98.6 F (37 C)  TempSrc: Tympanic  Weight: 180 lb 14.4 oz (82.1 kg)   Physical Exam  Constitutional: He is oriented to person, place, and time and well-developed, well-nourished, and in no distress.  HENT:  Head: Normocephalic and atraumatic.  Eyes: EOM are normal. Pupils are equal, round, and reactive to light.  Neck: Normal range of motion.  Cardiovascular: Normal rate, regular rhythm and normal heart sounds.   Pulmonary/Chest: Effort normal and breath sounds normal.  Abdominal: Soft. Bowel sounds are normal.  Neurological: He is alert and oriented to person, place, and time.  Skin: Skin is warm and dry.     CMP Latest Ref Rng & Units 04/12/2017  Glucose 65 - 99 mg/dL 94  BUN 6 - 20 mg/dL 31(H)  Creatinine 0.61 - 1.24 mg/dL 1.83(H)  Sodium 135 - 145 mmol/L 138  Potassium 3.5 -  5.1 mmol/L 3.8  Chloride 101 - 111 mmol/L 100(L)  CO2 22 - 32 mmol/L 29  Calcium 8.9 - 10.3 mg/dL 9.4  Total Protein 6.5 - 8.1 g/dL 7.0  Total Bilirubin 0.3 - 1.2 mg/dL 0.5  Alkaline Phos 38 - 126 U/L 89  AST 15 - 41 U/L 17  ALT 17 - 63 U/L 12(L)   CBC Latest Ref Rng & Units 04/12/2017  WBC 3.8 - 10.6 K/uL 5.5  Hemoglobin 13.0 - 18.0 g/dL 11.3(L)  Hematocrit 40.0 - 52.0 % 33.4(L)  Platelets 150 - 440 K/uL 300    No images are attached to the encounter.  Ct Abdomen Pelvis Wo Contrast  Result Date: 04/15/2017 CLINICAL DATA:  Prostate cancer. EXAM: CT CHEST, ABDOMEN AND PELVIS WITHOUT CONTRAST TECHNIQUE: Multidetector CT imaging of the chest, abdomen and pelvis was performed following the standard protocol without IV contrast. COMPARISON:  CT chest from 11/11/2016 and CT abdomen and pelvis from 10/16/2016 and bone scan from 10/20/2016 FINDINGS: CT CHEST FINDINGS  Cardiovascular: The heart size appears normal. There is no pericardial effusion. Aortic atherosclerosis. Calcification within the RCA and LAD and left circumflex coronary artery noted. Mediastinum/Nodes: Normal appearance of the esophagus. Right paratracheal node measures 1.3 cm, image 24 of series 2. Previously 1.6 cm. The sub- carinal lymph node measures 1 cm, image 30 of series 2. Previously this measured the same. No axillary or supraclavicular adenopathy. Lungs/Pleura: No pleural effusion. Mild biapical scarring. 4 mm right middle lobe perifissural nodule is unchanged, image 82 of series 4. Musculoskeletal: Widespread sclerotic bone metastases are again identified. The distribution and extent of metastases appears similar to previous exam. CT ABDOMEN PELVIS FINDINGS Hepatobiliary: No focal liver abnormality identified. The gallbladder appears within normal limits. No biliary dilatation. Pancreas: Normal appearance of the pancreas. Spleen: The spleen is unremarkable. Adrenals/Urinary Tract: Normal adrenal glands. Resolution of previous bilateral hydronephrosis. Increase soft tissue at the bladder base measures 4.2 by 1.5 cm, image 110 series 2. Previously 4.9 x 2.6 cm. Stomach/Bowel: Stomach is within normal limits. Appendix appears normal. No evidence of bowel wall thickening, distention, or inflammatory changes. Distal colonic diverticula identified without acute inflammation. Vascular/Lymphatic: Aortic atherosclerosis. No aneurysm. The index retrocaval node 1.1 cm, image 69 of series 2. Previously 2.4 cm. Index aortocaval lymph node measures 0.4 cm, image 64 of series 2. Previously 1.1 cm. The index retroaortic lymph node measures 0.7 cm, image 70 of series 2. Previously 1.9 cm. Index right pelvic sidewall lymph node measures 0.7 cm, image 100 of series 2. Previously 1.7 cm. The index left pelvic sidewall lymph node measures 0.3 cm, image 98 of series 2. Previously 1.1 cm. Reproductive: Interval decrease in  size of prostate gland. This measures 4.0 x 3.3 cm, image 111 of series 2. Previously 5.5 x 4.9 cm. Other: No abdominal wall hernia or abnormality. No abdominopelvic ascites. Musculoskeletal: Diffuse sclerotic bone metastases are again noted, similar to previous exam. IMPRESSION: 1. There is been interval response to therapy. 2. Decrease in size of prostate gland and mass within the bladder base. 3. Significant interval improvement and abdominal and pelvic adenopathy. 4. Stable appearance of diffuse sclerotic bone metastases. 5. Aortic Atherosclerosis (ICD10-I70.0). Coronary artery calcifications noted. Electronically Signed   By: Kerby Moors M.D.   On: 04/15/2017 09:47   Ct Chest Wo Contrast  Result Date: 04/15/2017 CLINICAL DATA:  Prostate cancer. EXAM: CT CHEST, ABDOMEN AND PELVIS WITHOUT CONTRAST TECHNIQUE: Multidetector CT imaging of the chest, abdomen and pelvis was performed following the standard  protocol without IV contrast. COMPARISON:  CT chest from 11/11/2016 and CT abdomen and pelvis from 10/16/2016 and bone scan from 10/20/2016 FINDINGS: CT CHEST FINDINGS Cardiovascular: The heart size appears normal. There is no pericardial effusion. Aortic atherosclerosis. Calcification within the RCA and LAD and left circumflex coronary artery noted. Mediastinum/Nodes: Normal appearance of the esophagus. Right paratracheal node measures 1.3 cm, image 24 of series 2. Previously 1.6 cm. The sub- carinal lymph node measures 1 cm, image 30 of series 2. Previously this measured the same. No axillary or supraclavicular adenopathy. Lungs/Pleura: No pleural effusion. Mild biapical scarring. 4 mm right middle lobe perifissural nodule is unchanged, image 82 of series 4. Musculoskeletal: Widespread sclerotic bone metastases are again identified. The distribution and extent of metastases appears similar to previous exam. CT ABDOMEN PELVIS FINDINGS Hepatobiliary: No focal liver abnormality identified. The gallbladder  appears within normal limits. No biliary dilatation. Pancreas: Normal appearance of the pancreas. Spleen: The spleen is unremarkable. Adrenals/Urinary Tract: Normal adrenal glands. Resolution of previous bilateral hydronephrosis. Increase soft tissue at the bladder base measures 4.2 by 1.5 cm, image 110 series 2. Previously 4.9 x 2.6 cm. Stomach/Bowel: Stomach is within normal limits. Appendix appears normal. No evidence of bowel wall thickening, distention, or inflammatory changes. Distal colonic diverticula identified without acute inflammation. Vascular/Lymphatic: Aortic atherosclerosis. No aneurysm. The index retrocaval node 1.1 cm, image 69 of series 2. Previously 2.4 cm. Index aortocaval lymph node measures 0.4 cm, image 64 of series 2. Previously 1.1 cm. The index retroaortic lymph node measures 0.7 cm, image 70 of series 2. Previously 1.9 cm. Index right pelvic sidewall lymph node measures 0.7 cm, image 100 of series 2. Previously 1.7 cm. The index left pelvic sidewall lymph node measures 0.3 cm, image 98 of series 2. Previously 1.1 cm. Reproductive: Interval decrease in size of prostate gland. This measures 4.0 x 3.3 cm, image 111 of series 2. Previously 5.5 x 4.9 cm. Other: No abdominal wall hernia or abnormality. No abdominopelvic ascites. Musculoskeletal: Diffuse sclerotic bone metastases are again noted, similar to previous exam. IMPRESSION: 1. There is been interval response to therapy. 2. Decrease in size of prostate gland and mass within the bladder base. 3. Significant interval improvement and abdominal and pelvic adenopathy. 4. Stable appearance of diffuse sclerotic bone metastases. 5. Aortic Atherosclerosis (ICD10-I70.0). Coronary artery calcifications noted. Electronically Signed   By: Kerby Moors M.D.   On: 04/15/2017 09:47   Nm Bone Scan Whole Body  Result Date: 04/15/2017 CLINICAL DATA:  24.0 mCi 67mc MDP. Prostate Cancer (metastatic), diagnosed December 2017. Radiation x 10  treatments, chemo pill now x 4 months. Pain and numbness low back and pelvis (posterior) going down right leg. Old fracture to right hand. No previous joint replacements or bone infections. EXAM: NUCLEAR MEDICINE WHOLE BODY BONE SCAN TECHNIQUE: Whole body anterior and posterior images were obtained approximately 3 hours after intravenous injection of radiopharmaceutical. RADIOPHARMACEUTICALS:  24.0 mCi Technetium-991mDP IV COMPARISON:  10/20/2016 FINDINGS: Faint activity is identified in the region of the kidneys. Bladder activity is present however. There are numerous sites of osseous metastatic disease including the right scapula, sternum, ribs, cervical, thoracic, lumbar, and sacral spine, pelvis, bilateral femurs. Focal areas of uptake in the knees and first metatarsophalangeal joints are likely degenerative. Probable degenerative changes in the first carpometacarpal joints. IMPRESSION: 1. Persistent diffuse osseous metastatic disease. 2. Sites of degenerative changes in the feet and hands. 3. The distribution metastatic disease appear similar to the previous exam. Electronically Signed  By: Nolon Nations M.D.   On: 04/15/2017 13:40   Dg Bone Density  Result Date: 04/20/2017 EXAM: DUAL X-RAY ABSORPTIOMETRY (DXA) FOR BONE MINERAL DENSITY IMPRESSION: Dear Dr. Lucendia Herrlich, Your patient Kaylem Gidney completed a BMD test on 04/20/2017 using the [employer] (analysis version: 14.10) manufactured by [employer]. The following summarizes the results of our evaluation. PATIENT BIOGRAPHICAL: Name: Suhaan, Perleberg Patient ID: [id number] Birth Date: 1935/08/17 Height: 68.0 in. Gender: Male Exam Date: 04/20/2017 Weight: 181.0 lbs. Indications: Advanced Age, Caucasian, Long term prednisone use, Prostate Cancer Fractures: Treatments: lupron injections, prednisone ASSESSMENT: The BMD measured at Femur Neck Left is 0.660 g/cm2 with a T-score of -3.2. This patient is considered osteopenic according to  [facility]) criteria. Lumbar spine was not utilized due to advanced degenerative changes. Site Region Measured Measured WHO Young Adult BMD Date       Age      Classification T-score DualFemur Neck Left 04/20/2017 80.0 Osteoporosis -3.2 0.660 g/cm2 Left Forearm Radius 33% 04/20/2017 80.0 Normal -1.0 0.892 g/cm2 World Health Organization [facility]) criteria for post-menopausal, Caucasian Women: Normal:       T-score at or above -1 SD Osteopenia:   T-score between -1 and -2.5 SD Osteoporosis: T-score at or below -2.5 SD RECOMMENDATIONS: [place] recommends that FDA-approved medical therapies be considered in postmenopausal women and men age 103 or older with a: 1. Hip or vertebral (clinical or morphometric) fracture. 2. T-score of < -2.5 at the spine or hip. 3. Ten-year fracture probability by FRAX of 3% or greater for hip fracture or 20% or greater for major osteoporotic fracture. All treatment decisions require clinical judgment and consideration of individual patient factors, including patient preferences, co-morbidities, previous drug use, risk factors not captured in the FRAX model (e.g. falls, vitamin D deficiency, increased bone turnover, interval significant decline in bone density) and possible under - or over-estimation of fracture risk by FRAX. All patients should ensure an adequate intake of dietary calcium (1200 mg/d) and vitamin D (800 IU daily) unless contraindicated. FOLLOW-UP: People with diagnosed cases of osteoporosis or osteopenia should be regularly tested for bone mineral density. For patients eligible for Medicare, routine testing is allowed once every 2 years. The testing frequency can be increased to one year for patients who have rapidly progressing disease, or for those who are receiving medical therapy to restore bone mass. I have reviewed this report, and agree with the above findings. [facility] Radiology Electronically Signed   By: Lowella Grip  III M.D.   On: 04/20/2017 10:54     Assessment and plan- Patient is a 82 y.o. male with high risk castrate sensitive metastatic prostate cancer with mets to bone and LN  Reviewed bone scan and CT scan results. Adenopathy improved. Bone lesions persist. His PSA has normalized and continues to trend down. Alk phos has normalized. Clinically he has had a good response. He is to continue zytiga and lupron at this point. Counts are stable  Anemia due to malignancy and CKD- improved. Monitor.  Low back pain- he will be seeing Dr. Baruch Gouty next month. He has castrate sensitive sensitive disease. Therefore not a candidate for xofigo yet  Osteoporosis noted on bone density. He would be a candidate for Xgeva Q6 months. However patient does not have a dentist and does not wish to see one given risk of osetonecrosis of jaw. He only wants to consider oral bisphosphonates. Will prescribe fosamax 70 mg once a week. He  wil also take calcium and vit D  Neoplasm related pain- continue prn oxycodone  Dysuria and hesitatncy- check UA. Treat with antibiotics if positive    Visit Diagnosis 1. Prostate cancer ([place])   2. Prostate cancer metastatic to bone ([place])   3. Dysuria   4. Age-related osteoporosis without current pathological fracture   5. Neoplasm related pain      Dr. Randa Evens, MD, MPH [facility] Pager- [phone] 04/21/2017 9:42 PM

## 2017-04-21 NOTE — Progress Notes (Signed)
UA +. Augmentin 875 BID for 5 days

## 2017-04-21 NOTE — Progress Notes (Signed)
Here for follow up  Stated he has been having since fri 6/16 -diff starting urination ,pain on urinating. No blood. Needs renewal of Zofran  4 mg -prn. Stated all issues started after scan on 16th.

## 2017-04-21 NOTE — Telephone Encounter (Signed)
Called pt's house and spoke to wife and told her that his urine showed few bacteria and I have sent atb to his pharmacy and I advised her to make sure he eats before he takes the atb. Both have been sent to pharmacy. She will go and pick them up

## 2017-04-21 NOTE — Telephone Encounter (Signed)
-----   Message from Sindy Guadeloupe, MD sent at 04/21/2017 11:41 AM EDT ----- UA +. Augmentin 875 BID for 5 days

## 2017-04-22 ENCOUNTER — Ambulatory Visit: Payer: Medicare Other | Admitting: Urology

## 2017-04-22 ENCOUNTER — Telehealth: Payer: Self-pay | Admitting: *Deleted

## 2017-04-22 ENCOUNTER — Other Ambulatory Visit: Payer: Self-pay | Admitting: *Deleted

## 2017-04-22 MED ORDER — ALENDRONATE SODIUM 70 MG PO TABS: 70.0000 mg | ORAL_TABLET | ORAL | 3 refills | Status: DC

## 2017-04-22 NOTE — Telephone Encounter (Signed)
I called pt's home and spoke to wife and told her that I forgot to send rx for the fosamax that pt will take once a week.  I had gave them a print out and turned the page of how to take and went over instructions. Before you  Eat, without any other meds and making sure he stayed sitting up for at least 30 min but I rec: 1 hour.   Did advise him to take calcium and vit d 1 tablet bid.  I did write that info down on the top of paper for pt also.  Wife wrote it down again and she will go pick up the new rx today and get the calium/vit d otc.

## 2017-04-23 LAB — URINE CULTURE: Culture: >=100000 — AB

## 2017-04-25 ENCOUNTER — Telehealth: Payer: Self-pay | Admitting: *Deleted

## 2017-04-25 DIAGNOSIS — N390 Urinary tract infection, site not specified: Secondary | ICD-10-CM

## 2017-04-25 MED ORDER — CIPROFLOXACIN HCL 250 MG PO TABS: 250.0000 mg | ORAL_TABLET | Freq: Two times a day (BID) | ORAL | 0 refills | Status: DC

## 2017-04-25 NOTE — Telephone Encounter (Signed)
Called and got voicemail and left message that the first atb given to him for UTI is not sensitive to cover the bacteria.  I have sent cipro in for the patient at reduce dose due to his kidney functions was not as good but he will stop the augmentin and start cipro to help with UTI. I did ask if they would call me back and let me know that they got message and will be starting new atb

## 2017-04-26 ENCOUNTER — Other Ambulatory Visit: Payer: Self-pay | Admitting: *Deleted

## 2017-04-26 DIAGNOSIS — C7951 Secondary malignant neoplasm of bone: Principal | ICD-10-CM

## 2017-04-26 DIAGNOSIS — C61 Malignant neoplasm of prostate: Secondary | ICD-10-CM

## 2017-04-26 MED ORDER — PREDNISONE 5 MG PO TABS: 5.0000 mg | ORAL_TABLET | Freq: Every day | ORAL | 0 refills | Status: DC

## 2017-04-26 MED ORDER — ABIRATERONE ACETATE 250 MG PO TABS: 1000.0000 mg | ORAL_TABLET | Freq: Every day | ORAL | 0 refills | Status: DC

## 2017-04-28 ENCOUNTER — Ambulatory Visit: Payer: Medicare Other | Admitting: Urology

## 2017-05-03 ENCOUNTER — Inpatient Hospital Stay: Payer: Medicare Other | Attending: Oncology

## 2017-05-03 DIAGNOSIS — J45909 Unspecified asthma, uncomplicated: Secondary | ICD-10-CM | POA: Diagnosis not present

## 2017-05-03 DIAGNOSIS — M545 Low back pain: Secondary | ICD-10-CM | POA: Diagnosis not present

## 2017-05-03 DIAGNOSIS — R1310 Dysphagia, unspecified: Secondary | ICD-10-CM | POA: Insufficient documentation

## 2017-05-03 DIAGNOSIS — I2510 Atherosclerotic heart disease of native coronary artery without angina pectoris: Secondary | ICD-10-CM | POA: Diagnosis not present

## 2017-05-03 DIAGNOSIS — C7951 Secondary malignant neoplasm of bone: Secondary | ICD-10-CM | POA: Insufficient documentation

## 2017-05-03 DIAGNOSIS — N189 Chronic kidney disease, unspecified: Secondary | ICD-10-CM | POA: Insufficient documentation

## 2017-05-03 DIAGNOSIS — C61 Malignant neoplasm of prostate: Secondary | ICD-10-CM | POA: Insufficient documentation

## 2017-05-03 DIAGNOSIS — Z79899 Other long term (current) drug therapy: Secondary | ICD-10-CM | POA: Insufficient documentation

## 2017-05-03 DIAGNOSIS — D631 Anemia in chronic kidney disease: Secondary | ICD-10-CM | POA: Insufficient documentation

## 2017-05-03 DIAGNOSIS — Z79818 Long term (current) use of other agents affecting estrogen receptors and estrogen levels: Secondary | ICD-10-CM | POA: Insufficient documentation

## 2017-05-03 DIAGNOSIS — I129 Hypertensive chronic kidney disease with stage 1 through stage 4 chronic kidney disease, or unspecified chronic kidney disease: Secondary | ICD-10-CM | POA: Insufficient documentation

## 2017-05-03 DIAGNOSIS — Z87891 Personal history of nicotine dependence: Secondary | ICD-10-CM | POA: Diagnosis not present

## 2017-05-03 DIAGNOSIS — M818 Other osteoporosis without current pathological fracture: Secondary | ICD-10-CM | POA: Insufficient documentation

## 2017-05-03 DIAGNOSIS — K140 Glossitis: Secondary | ICD-10-CM | POA: Diagnosis not present

## 2017-05-03 MED ORDER — LEUPROLIDE ACETATE (3 MONTH) 22.5 MG IM KIT
22.5000 mg | PACK | Freq: Once | INTRAMUSCULAR | Status: AC
Start: 2017-05-03 — End: 2017-05-03
  Administered 2017-05-03: 22.5 mg via INTRAMUSCULAR

## 2017-05-11 ENCOUNTER — Other Ambulatory Visit: Payer: Self-pay

## 2017-05-11 DIAGNOSIS — N3289 Other specified disorders of bladder: Secondary | ICD-10-CM

## 2017-05-11 DIAGNOSIS — C61 Malignant neoplasm of prostate: Secondary | ICD-10-CM

## 2017-05-11 MED ORDER — OXYCODONE HCL 10 MG PO TABS: 10.0000 mg | ORAL_TABLET | ORAL | 0 refills | Status: DC | PRN

## 2017-05-19 ENCOUNTER — Other Ambulatory Visit: Payer: Self-pay | Admitting: *Deleted

## 2017-05-19 ENCOUNTER — Inpatient Hospital Stay: Payer: Medicare Other

## 2017-05-19 ENCOUNTER — Inpatient Hospital Stay (HOSPITAL_BASED_OUTPATIENT_CLINIC_OR_DEPARTMENT_OTHER): Payer: Medicare Other | Admitting: Oncology

## 2017-05-19 ENCOUNTER — Ambulatory Visit: Payer: Medicare Other | Admitting: Radiation Oncology

## 2017-05-19 ENCOUNTER — Encounter: Payer: Self-pay | Admitting: Oncology

## 2017-05-19 VITALS — BP 151/76 | HR 66 | Temp 98.2°F | Resp 16 | Ht 68.0 in | Wt 182.4 lb

## 2017-05-19 DIAGNOSIS — G893 Neoplasm related pain (acute) (chronic): Secondary | ICD-10-CM

## 2017-05-19 DIAGNOSIS — K140 Glossitis: Secondary | ICD-10-CM

## 2017-05-19 DIAGNOSIS — C61 Malignant neoplasm of prostate: Secondary | ICD-10-CM | POA: Diagnosis not present

## 2017-05-19 DIAGNOSIS — Z79818 Long term (current) use of other agents affecting estrogen receptors and estrogen levels: Secondary | ICD-10-CM

## 2017-05-19 DIAGNOSIS — Z79899 Other long term (current) drug therapy: Secondary | ICD-10-CM

## 2017-05-19 DIAGNOSIS — C7951 Secondary malignant neoplasm of bone: Secondary | ICD-10-CM | POA: Diagnosis not present

## 2017-05-19 DIAGNOSIS — I2510 Atherosclerotic heart disease of native coronary artery without angina pectoris: Secondary | ICD-10-CM | POA: Diagnosis not present

## 2017-05-19 DIAGNOSIS — D631 Anemia in chronic kidney disease: Secondary | ICD-10-CM

## 2017-05-19 DIAGNOSIS — R1310 Dysphagia, unspecified: Secondary | ICD-10-CM | POA: Diagnosis not present

## 2017-05-19 DIAGNOSIS — M818 Other osteoporosis without current pathological fracture: Secondary | ICD-10-CM | POA: Diagnosis not present

## 2017-05-19 DIAGNOSIS — I129 Hypertensive chronic kidney disease with stage 1 through stage 4 chronic kidney disease, or unspecified chronic kidney disease: Secondary | ICD-10-CM | POA: Diagnosis not present

## 2017-05-19 DIAGNOSIS — N189 Chronic kidney disease, unspecified: Secondary | ICD-10-CM

## 2017-05-19 DIAGNOSIS — M545 Low back pain: Secondary | ICD-10-CM | POA: Diagnosis not present

## 2017-05-19 DIAGNOSIS — Z87891 Personal history of nicotine dependence: Secondary | ICD-10-CM

## 2017-05-19 DIAGNOSIS — J45909 Unspecified asthma, uncomplicated: Secondary | ICD-10-CM | POA: Diagnosis not present

## 2017-05-19 LAB — COMPREHENSIVE METABOLIC PANEL
ALBUMIN: 3.5 g/dL (ref 3.5–5.0)
ALK PHOS: 78 U/L (ref 38–126)
ALT: 12 U/L — AB (ref 17–63)
ANION GAP: 7 (ref 5–15)
AST: 15 U/L (ref 15–41)
BILIRUBIN TOTAL: 0.4 mg/dL (ref 0.3–1.2)
BUN: 40 mg/dL — AB (ref 6–20)
CALCIUM: 9.4 mg/dL (ref 8.9–10.3)
CO2: 29 mmol/L (ref 22–32)
CREATININE: 1.92 mg/dL — AB (ref 0.61–1.24)
Chloride: 101 mmol/L (ref 101–111)
GFR calc Af Amer: 36 mL/min — ABNORMAL LOW (ref >60)
GFR calc non Af Amer: 31 mL/min — ABNORMAL LOW (ref >60)
GLUCOSE: 124 mg/dL — AB (ref 65–99)
Potassium: 4.6 mmol/L (ref 3.5–5.1)
Sodium: 137 mmol/L (ref 135–145)
TOTAL PROTEIN: 6.7 g/dL (ref 6.5–8.1)

## 2017-05-19 LAB — CBC WITH DIFFERENTIAL/PLATELET
BASOS ABS: 0.1 10*3/uL (ref 0–0.1)
BASOS PCT: 1 %
EOS ABS: 0.3 10*3/uL (ref 0–0.7)
EOS PCT: 4 %
HCT: 31.7 % — ABNORMAL LOW (ref 40.0–52.0)
Hemoglobin: 10.8 g/dL — ABNORMAL LOW (ref 13.0–18.0)
Lymphocytes Relative: 9 %
Lymphs Abs: 0.6 10*3/uL — ABNORMAL LOW (ref 1.0–3.6)
MCH: 30.6 pg (ref 26.0–34.0)
MCHC: 34.2 g/dL (ref 32.0–36.0)
MCV: 89.3 fL (ref 80.0–100.0)
MONO ABS: 0.6 10*3/uL (ref 0.2–1.0)
Monocytes Relative: 9 %
NEUTROS ABS: 5.1 10*3/uL (ref 1.4–6.5)
Neutrophils Relative %: 77 %
PLATELETS: 295 10*3/uL (ref 150–440)
RBC: 3.55 MIL/uL — ABNORMAL LOW (ref 4.40–5.90)
RDW: 16 % — ABNORMAL HIGH (ref 11.5–14.5)
WBC: 6.6 10*3/uL (ref 3.8–10.6)

## 2017-05-19 MED ORDER — MAGIC MOUTHWASH: 5.0000 mL | Freq: Four times a day (QID) | ORAL | 0 refills | Status: DC | PRN

## 2017-05-19 MED ORDER — CLOTRIMAZOLE 10 MG MT LOZG: 10.0000 mg | LOZENGE | Freq: Four times a day (QID) | OROMUCOSAL | 1 refills | Status: DC

## 2017-05-19 NOTE — Progress Notes (Signed)
Hematology/Oncology Consult note [facility]  [phone] [phone]  Patient Care Team: Patient, No Pcp Per as PCP - General (General Practice)   Name of the patient: Johnathan Gould  [id number]  February 03, 1935   Date of visit: 05/19/17   Diagnosis- 1. 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 Anemia of chronic kidney disease  Chief complaint/ Reason for visit- routine f/u  Heme/Onc history:patient is a 82 year old male was recently admitted to the hospital on 10/16/2016 with symptoms of left hip pain and renal failure. He was found to have bilateral hydronephrosis and is status post bilateral nephrostomy tube placement. CT abdomen showed an irregular polypoid mass at the posterior aspect of the bladder highly concerning for bladder carcinoma. Extensive bulky adenopathy throughout the abdomen and pelvis consistent with nodal metastases. Widespread osseous metastatic disease. Bone scan showed multifocal areas of increased activity throughout the pelvis, bilateral femurs, rib cage, right scapula, thoracic cervical and lumbar spine consistent with metastatic disease. Patient was found to have enlarged abnormal prostate as well as an elevated PSA of 469 and was presumptively diagnosed with metastatic prostate cancer. He was started on Firmagon by urology. He was also seen by radiation oncology and started palliative radiation to his left hip on 11/04/2016 and finished 10 doses of palliative Rt.  He is currently on lupron through urology  4 core biopsy obtained for tissue diagnosis which showed: prostate adenocarcinoma gleasons 9 and 10 in all 4 cores  zytiga started in March 2018  Interval history- low back pain well controlled with oxycodone. He has worsening tongue ulcerations which is causing pain and discomfort as well as trouble swallowing  ECOG PS- 1 Pain scale- 0 Opioid associated constipation- no  Review of systems- Review  of Systems  Constitutional: Positive for malaise/fatigue. Negative for chills, fever and weight loss.  HENT: Negative for congestion, ear discharge and nosebleeds.        Tongue ulcers  Eyes: Negative for blurred vision.  Respiratory: Negative for cough, hemoptysis, sputum production, shortness of breath and wheezing.   Cardiovascular: Negative for chest pain, palpitations, orthopnea and claudication.  Gastrointestinal: Negative for abdominal pain, blood in stool, constipation, diarrhea, heartburn, melena, nausea and vomiting.  Genitourinary: Negative for dysuria, flank pain, frequency, hematuria and urgency.  Musculoskeletal: Negative for back pain, joint pain and myalgias.  Skin: Negative for rash.  Neurological: Negative for dizziness, tingling, focal weakness, seizures, weakness and headaches.  Endo/Heme/Allergies: Does not bruise/bleed easily.  Psychiatric/Behavioral: Negative for depression and suicidal ideas. The patient does not have insomnia.       No Known Allergies   Past Medical History:  Diagnosis Date  . CAD (coronary artery disease)   . Cancer ([place])   . Hypertension   . RAD (reactive airway disease)      Past Surgical History:  Procedure Laterality Date  . CORONARY ANGIOPLASTY WITH STENT PLACEMENT    . IR GENERIC HISTORICAL  10/17/2016   IR NEPHROSTOMY PLACEMENT RIGHT 10/17/2016 [facility]INTER[facility]  . IR GENERIC HISTORICAL  10/17/2016   IR NEPHROSTOMY PLACEMENT LEFT 10/17/2016 [name], MD [facility]  . IR GENERIC HISTORICAL  12/23/2016   IR NEPHROSTOMY EXCHANGE LEFT 12/23/2016 [facility]  . IR GENERIC HISTORICAL  12/23/2016   IR NEPHROSTOMY EXCHANGE RIGHT 12/23/2016 [facility]  . IR NEPHROSTOMY EXCHANGE LEFT  02/11/2017  . IR NEPHROSTOMY EXCHANGE RIGHT  02/11/2017  . KNEE SURGERY Left 2006   Laparascopy done on left knee, scar tissue taken out  .  nephrostomy tubes Bilateral     Social History   Social History  . Marital status: Married     Spouse name: N/A  . Number of children: N/A  . Years of education: N/A   Occupational History  . Not on file.   Social History Main Topics  . Smoking status: Former Smoker    Packs/day: 1.00    Years: 69.50    Quit date: 11/04/2014  . Smokeless tobacco: Never Used  . Alcohol use No  . Drug use: No  . Sexual activity: Yes   Other Topics Concern  . Not on file   Social History Narrative  . No narrative on file    Family History  Problem Relation Age of Onset  . Hypertension Other   . Lung cancer Brother      Current Outpatient Prescriptions:  .  abiraterone Acetate (ZYTIGA) 250 MG tablet, Take 4 tablets (1,000 mg total) by mouth daily. Take on an empty stomach 1 hour before or 2 hours after a meal, Disp: 120 tablet, Rfl: 0 .  alendronate (FOSAMAX) 70 MG tablet, Take 1 tablet (70 mg total) by mouth once a week. Take with a full glass of water on an empty stomach., Disp: 4 tablet, Rfl: 3 .  ciprofloxacin (CIPRO) 250 MG tablet, Take 1 tablet (250 mg total) by mouth 2 (two) times daily., Disp: 10 tablet, Rfl: 0 .  clotrimazole (MYCELEX) 10 MG troche, Take 1 lozenge (10 mg total) by mouth 4 (four) times daily. Until white patches resolved in mouth (Patient not taking: Reported on 04/21/2017), Disp: 70 lozenge, Rfl: 1 .  hydrochlorothiazide (HYDRODIURIL) 12.5 MG tablet, , Disp: , Rfl:  .  magic mouthwash SOLN, Take 5 mLs by mouth 4 (four) times daily as needed for mouth pain. (Patient not taking: Reported on 04/21/2017), Disp: 480 mL, Rfl: 0 .  metoprolol (LOPRESSOR) 50 MG tablet, , Disp: , Rfl:  .  nitroGLYCERIN (NITROSTAT) 0.4 MG SL tablet, Place 0.4 mg under the tongue every 5 (five) minutes x 3 doses as needed for chest pain., Disp: , Rfl:  .  ondansetron (ZOFRAN) 4 MG tablet, Take 1 tablet (4 mg total) by mouth every 6 (six) hours as needed. for nausea, Disp: 40 tablet, Rfl: 1 .  Oxycodone HCl 10 MG TABS, Take 1 tablet (10 mg total) by mouth every 4 (four) hours as needed., Disp: 60  tablet, Rfl: 0 .  polyethylene glycol (MIRALAX / GLYCOLAX) packet, Take 17 g by mouth daily as needed. , Disp: , Rfl:  .  predniSONE (DELTASONE) 5 MG tablet, Take 1 tablet (5 mg total) by mouth daily with breakfast., Disp: 30 tablet, Rfl: 0 .  SPS 15 GM/60ML suspension, Take 60 mLs by mouth daily., Disp: , Rfl:   Physical exam:  Vitals:   05/19/17 1043  BP: (!) 151/76  Pulse: 66  Resp: 16  Temp: 98.2 F (36.8 C)  TempSrc: Tympanic  Weight: 182 lb 6.4 oz (82.7 kg)  Height: 5\' 8"  (1.727 m)   Physical Exam  Constitutional: He is oriented to person, place, and time and well-developed, well-nourished, and in no distress.  HENT:  Head: Normocephalic and atraumatic.  Oval ulcerations scattered 3-4 in number noted over the entire tongue along with white coating  Eyes: Pupils are equal, round, and reactive to light. EOM are normal.  Neck: Normal range of motion.  Cardiovascular: Normal rate, regular rhythm and normal heart sounds.   Pulmonary/Chest: Effort normal and breath sounds normal.  Abdominal: Soft. Bowel sounds are normal.  Neurological: He is alert and oriented to person, place, and time.  Skin: Skin is warm and dry.     CMP Latest Ref Rng & Units 04/12/2017  Glucose 65 - 99 mg/dL 94  BUN 6 - 20 mg/dL 31(H)  Creatinine 0.61 - 1.24 mg/dL 1.83(H)  Sodium 135 - 145 mmol/L 138  Potassium 3.5 - 5.1 mmol/L 3.8  Chloride 101 - 111 mmol/L 100(L)  CO2 22 - 32 mmol/L 29  Calcium 8.9 - 10.3 mg/dL 9.4  Total Protein 6.5 - 8.1 g/dL 7.0  Total Bilirubin 0.3 - 1.2 mg/dL 0.5  Alkaline Phos 38 - 126 U/L 89  AST 15 - 41 U/L 17  ALT 17 - 63 U/L 12(L)   CBC Latest Ref Rng & Units 04/12/2017  WBC 3.8 - 10.6 K/uL 5.5  Hemoglobin 13.0 - 18.0 g/dL 11.3(L)  Hematocrit 40.0 - 52.0 % 33.4(L)  Platelets 150 - 440 K/uL 300    No images are attached to the encounter.  Dg Bone Density  Result Date: 04/20/2017 EXAM: DUAL X-RAY ABSORPTIOMETRY (DXA) FOR BONE MINERAL DENSITY IMPRESSION: Dear Dr.  Lucendia Herrlich, Your patient Johnathan Gould completed a BMD test on 04/20/2017 using the [place] (analysis version: 14.10) manufactured by [employer]. The following summarizes the results of our evaluation. PATIENT BIOGRAPHICAL: Name: Zahi, Plaskett Patient ID: [id number] Birth Date: 1935-04-28 Height: 68.0 in. Gender: Male Exam Date: 04/20/2017 Weight: 181.0 lbs. Indications: Advanced Age, Caucasian, Long term prednisone use, Prostate Cancer Fractures: Treatments: lupron injections, prednisone ASSESSMENT: The BMD measured at Femur Neck Left is 0.660 g/cm2 with a T-score of -3.2. This patient is considered osteopenic according to [facility]) criteria. Lumbar spine was not utilized due to advanced degenerative changes. Site Region Measured Measured WHO Young Adult BMD Date       Age      Classification T-score DualFemur Neck Left 04/20/2017 80.0 Osteoporosis -3.2 0.660 g/cm2 Left Forearm Radius 33% 04/20/2017 80.0 Normal -1.0 0.892 g/cm2 World Health Organization [facility]) criteria for post-menopausal, Caucasian Women: Normal:       T-score at or above -1 SD Osteopenia:   T-score between -1 and -2.5 SD Osteoporosis: T-score at or below -2.5 SD RECOMMENDATIONS: [place] recommends that FDA-approved medical therapies be considered in postmenopausal women and men age 76 or older with a: 1. Hip or vertebral (clinical or morphometric) fracture. 2. T-score of < -2.5 at the spine or hip. 3. Ten-year fracture probability by FRAX of 3% or greater for hip fracture or 20% or greater for major osteoporotic fracture. All treatment decisions require clinical judgment and consideration of individual patient factors, including patient preferences, co-morbidities, previous drug use, risk factors not captured in the FRAX model (e.g. falls, vitamin D deficiency, increased bone turnover, interval significant decline in bone density) and possible under - or over-estimation of  fracture risk by FRAX. All patients should ensure an adequate intake of dietary calcium (1200 mg/d) and vitamin D (800 IU daily) unless contraindicated. FOLLOW-UP: People with diagnosed cases of osteoporosis or osteopenia should be regularly tested for bone mineral density. For patients eligible for Medicare, routine testing is allowed once every 2 years. The testing frequency can be increased to one year for patients who have rapidly progressing disease, or for those who are receiving medical therapy to restore bone mass. I have reviewed this report, and agree with the above findings. [facility]iology Electronically Signed   By: Gwyndolyn Saxon  Jasmine December III M.D.   On: 04/20/2017 10:54     Assessment and plan- Patient is a 82 y.o. male with high risk castrate sensitive metastatic prostate cancer with mets to bone and LN  High risk castrate sensistive prostate cancer- psa hs normalized and scans show response. Given tongue ulcerations, I have asked him to hold zytiga and prednisone for 3 weeks. Will prescribe magic mouthwash and mycelex troche. Tongue swab to rule out HSV   Anemia due to malignancy and CKD- hb stable between 111. Continue to monitor.  Low back pain- continue prn oxycodone  Osteoporosis noted on bone density. Patient has not been taking his fosamax. I have encouraged him to try. continue Calcium and vitamin D. He declines parenteral bisphosphonates  RTC in 3 weeks with labs     Visit Diagnosis 1. Prostate cancer ([place])   2. Anemia of chronic renal failure, unspecified CKD stage   3. High risk medication use   4. Ulceration (traumatic) of tongue   5. Prostate cancer metastatic to bone [facility])      Dr. Randa Evens, MD, MPH [facility] at [facility] Pager- [phone] 05/19/2017 11:53 AM

## 2017-05-19 NOTE — Progress Notes (Signed)
Patient here for follow up. Patient states he has had GI upset and severe headaches for the past two weeks. Nausea meds not working. His tongue is very sore.

## 2017-05-19 NOTE — Patient Instructions (Signed)
Stop zytiga and prednisone until patient comes back in 4 weeks

## 2017-05-20 LAB — MISC LABCORP TEST (SEND OUT): LABCORP TEST CODE: 8250

## 2017-05-24 ENCOUNTER — Other Ambulatory Visit: Payer: Self-pay | Admitting: *Deleted

## 2017-05-25 ENCOUNTER — Ambulatory Visit
Admission: RE | Admit: 2017-05-25 | Discharge: 2017-05-25 | Disposition: A | Discharge status: Home / Self Care | Payer: Medicare Other | Source: Ambulatory Visit | Attending: Radiation Oncology | Admitting: Radiation Oncology

## 2017-05-25 VITALS — BP 147/79 | HR 77 | Temp 98.7°F | Wt 184.4 lb

## 2017-05-25 DIAGNOSIS — C61 Malignant neoplasm of prostate: Secondary | ICD-10-CM | POA: Insufficient documentation

## 2017-05-25 DIAGNOSIS — D631 Anemia in chronic kidney disease: Secondary | ICD-10-CM | POA: Diagnosis not present

## 2017-05-25 DIAGNOSIS — M810 Age-related osteoporosis without current pathological fracture: Secondary | ICD-10-CM | POA: Diagnosis not present

## 2017-05-25 DIAGNOSIS — C7951 Secondary malignant neoplasm of bone: Secondary | ICD-10-CM | POA: Diagnosis not present

## 2017-05-25 DIAGNOSIS — Z923 Personal history of irradiation: Secondary | ICD-10-CM | POA: Diagnosis not present

## 2017-05-25 DIAGNOSIS — N189 Chronic kidney disease, unspecified: Secondary | ICD-10-CM | POA: Diagnosis not present

## 2017-05-25 LAB — HSV CULTURE AND TYPING

## 2017-05-25 NOTE — Progress Notes (Signed)
Patient here for follow up. Continues to have pain, a painful store on his tongue.

## 2017-05-25 NOTE — Progress Notes (Signed)
Radiation Oncology Follow up Note  Name: Johnathan Gould   Date:   05/25/2017 MRN:  [id number] DOB: [birth year]    This 82 y.o. male presents to the clinic today for six-month follow-up status post palliative radiation therapy to his lumbar spine and SI joints as well as his left hip for stage IV prostate cancer.  REFERRING PROVIDER: No ref. provider found  HPI: Patient is a 82 year old male now seen out 6 months having completed palliative radiation therapy to his left hip including an SI joint and L5 vertebral body for stage IV prostate cancer. He continues to be on narcotic analgesics for pain.Marland Kitchen Recent bone scan performed back in June show persistent diffuse osseous metastatic disease. He also does have osteoporosis noted on bone density study and has been taking calcium and vitamin D. He continues to have diffuse generalized bone pain. He is currently on hold from [place] and prednisone  COMPLICATIONS OF TREATMENT: none  FOLLOW UP COMPLIANCE: keeps appointments   PHYSICAL EXAM:  BP (!) 147/79 (BP Location: Left Arm, Patient Position: Sitting, Cuff Size: Normal)   Pulse 77   Temp 98.7 F (37.1 C) (Tympanic)   Wt 184 lb 6.6 oz (83.7 kg)   BMI 28.04 kg/m  Range of motion of his lower extremities does not elicit pain deep palpation of his lumbar spine does not elicit pain. Motor sensory and DTR levels are equal and symmetric in the upper lower extremities. Well-developed well-nourished patient in NAD. HEENT reveals PERLA, EOMI, discs not visualized.  Oral cavity is clear. No oral mucosal lesions are identified. Neck is clear without evidence of cervical or supraclavicular adenopathy. Lungs are clear to A&P. Cardiac examination is essentially unremarkable with regular rate and rhythm without murmur rub or thrill. Abdomen is benign with no organomegaly or masses noted. Motor sensory and DTR levels are equal and symmetric in the upper and lower extremities. Cranial nerves II through XII are  grossly intact. Proprioception is intact. No peripheral adenopathy or edema is identified. No motor or sensory levels are noted. Crude visual fields are within normal range.  RADIOLOGY RESULTS: Recent bone scan is reviewed compatible with the above-stated findings  PLAN: At this time I have discussedXofigo treatment. Based on his narcotic analgesic dependency continued bone pain I think he would be a good candidate for treatment at this time with radium 225. This will incorporate 6 injections over 6 months. I will discuss the case personally with medical oncology for their opinion and get back to the patient about his suitability for this treatment. He does have anemia of chronic renal disease and would have to have sufficient blood parameters to go to undergo this type of treatment. According to his recent blood parameters he does fill the criteria for being able to receive macro X.  I would like to take this opportunity to thank you for allowing me to participate in the care of your patient.Armstead Peaks., MD

## 2017-05-26 ENCOUNTER — Telehealth: Payer: Self-pay | Admitting: *Deleted

## 2017-05-26 NOTE — Progress Notes (Signed)
I discussed possibility of Xofigo treatment with Dr. [name]. Patient seems to be still responding well to hormonal  manipulation with a ADT  with good biochemical response to therapy. At this time will hold radium 225 treatments. I'll see the patient back in 6 months for follow-up. Would be happy to reevaluate him forXofigo treatment at any time should he show evidence of castrate resistant disease.

## 2017-05-26 NOTE — Telephone Encounter (Signed)
Patient notified of  follow up appointment in 6 months.

## 2017-06-10 ENCOUNTER — Encounter: Payer: Self-pay | Admitting: Oncology

## 2017-06-10 ENCOUNTER — Inpatient Hospital Stay: Payer: Medicare Other | Attending: Oncology

## 2017-06-10 ENCOUNTER — Inpatient Hospital Stay (HOSPITAL_BASED_OUTPATIENT_CLINIC_OR_DEPARTMENT_OTHER): Payer: Medicare Other | Admitting: Oncology

## 2017-06-10 VITALS — BP 127/75 | HR 61 | Temp 97.8°F | Resp 18 | Ht 68.0 in | Wt 185.0 lb

## 2017-06-10 DIAGNOSIS — N189 Chronic kidney disease, unspecified: Secondary | ICD-10-CM | POA: Diagnosis not present

## 2017-06-10 DIAGNOSIS — C61 Malignant neoplasm of prostate: Secondary | ICD-10-CM | POA: Diagnosis not present

## 2017-06-10 DIAGNOSIS — M549 Dorsalgia, unspecified: Secondary | ICD-10-CM | POA: Diagnosis not present

## 2017-06-10 DIAGNOSIS — R5383 Other fatigue: Secondary | ICD-10-CM | POA: Insufficient documentation

## 2017-06-10 DIAGNOSIS — I129 Hypertensive chronic kidney disease with stage 1 through stage 4 chronic kidney disease, or unspecified chronic kidney disease: Secondary | ICD-10-CM | POA: Diagnosis not present

## 2017-06-10 DIAGNOSIS — D631 Anemia in chronic kidney disease: Secondary | ICD-10-CM | POA: Diagnosis not present

## 2017-06-10 DIAGNOSIS — K140 Glossitis: Secondary | ICD-10-CM | POA: Diagnosis not present

## 2017-06-10 DIAGNOSIS — N3289 Other specified disorders of bladder: Secondary | ICD-10-CM

## 2017-06-10 DIAGNOSIS — I2510 Atherosclerotic heart disease of native coronary artery without angina pectoris: Secondary | ICD-10-CM | POA: Diagnosis not present

## 2017-06-10 DIAGNOSIS — C7951 Secondary malignant neoplasm of bone: Secondary | ICD-10-CM | POA: Insufficient documentation

## 2017-06-10 DIAGNOSIS — J45909 Unspecified asthma, uncomplicated: Secondary | ICD-10-CM

## 2017-06-10 DIAGNOSIS — Z79899 Other long term (current) drug therapy: Secondary | ICD-10-CM | POA: Insufficient documentation

## 2017-06-10 DIAGNOSIS — Z87891 Personal history of nicotine dependence: Secondary | ICD-10-CM | POA: Diagnosis not present

## 2017-06-10 LAB — COMPREHENSIVE METABOLIC PANEL
ALBUMIN: 3.9 g/dL (ref 3.5–5.0)
ALT: 11 U/L — AB (ref 17–63)
AST: 17 U/L (ref 15–41)
Alkaline Phosphatase: 62 U/L (ref 38–126)
Anion gap: 8 (ref 5–15)
BUN: 42 mg/dL — AB (ref 6–20)
CHLORIDE: 101 mmol/L (ref 101–111)
CO2: 27 mmol/L (ref 22–32)
CREATININE: 1.95 mg/dL — AB (ref 0.61–1.24)
Calcium: 9 mg/dL (ref 8.9–10.3)
GFR calc Af Amer: 36 mL/min — ABNORMAL LOW (ref >60)
GFR calc non Af Amer: 31 mL/min — ABNORMAL LOW (ref >60)
GLUCOSE: 126 mg/dL — AB (ref 65–99)
POTASSIUM: 4.3 mmol/L (ref 3.5–5.1)
Sodium: 136 mmol/L (ref 135–145)
Total Bilirubin: 0.4 mg/dL (ref 0.3–1.2)
Total Protein: 7 g/dL (ref 6.5–8.1)

## 2017-06-10 LAB — CBC WITH DIFFERENTIAL/PLATELET
BASOS ABS: 0.1 10*3/uL (ref 0–0.1)
BASOS PCT: 1 %
EOS PCT: 5 %
Eosinophils Absolute: 0.3 10*3/uL (ref 0–0.7)
HCT: 31.8 % — ABNORMAL LOW (ref 40.0–52.0)
Hemoglobin: 10.6 g/dL — ABNORMAL LOW (ref 13.0–18.0)
LYMPHS PCT: 11 %
Lymphs Abs: 0.6 10*3/uL — ABNORMAL LOW (ref 1.0–3.6)
MCH: 30.1 pg (ref 26.0–34.0)
MCHC: 33.4 g/dL (ref 32.0–36.0)
MCV: 90.2 fL (ref 80.0–100.0)
MONO ABS: 0.6 10*3/uL (ref 0.2–1.0)
Monocytes Relative: 10 %
NEUTROS ABS: 4.2 10*3/uL (ref 1.4–6.5)
Neutrophils Relative %: 73 %
PLATELETS: 337 10*3/uL (ref 150–440)
RBC: 3.53 MIL/uL — AB (ref 4.40–5.90)
RDW: 15.1 % — AB (ref 11.5–14.5)
WBC: 5.8 10*3/uL (ref 3.8–10.6)

## 2017-06-10 LAB — PSA: Prostatic Specific Antigen: 0.01 ng/mL (ref 0.00–4.00)

## 2017-06-10 MED ORDER — ABIRATERONE ACETATE 250 MG PO TABS: 1000.0000 mg | ORAL_TABLET | Freq: Every day | ORAL | 0 refills | Status: DC

## 2017-06-10 MED ORDER — ACYCLOVIR 400 MG PO TABS: 400.0000 mg | ORAL_TABLET | Freq: Every day | ORAL | 0 refills | Status: DC

## 2017-06-10 MED ORDER — OXYCODONE HCL 10 MG PO TABS: 10.0000 mg | ORAL_TABLET | ORAL | 0 refills | Status: DC | PRN

## 2017-06-10 MED ORDER — ONDANSETRON HCL 4 MG PO TABS: 4.0000 mg | ORAL_TABLET | Freq: Four times a day (QID) | ORAL | 1 refills | Status: DC | PRN

## 2017-06-10 MED ORDER — PREDNISONE 5 MG PO TABS: 5.0000 mg | ORAL_TABLET | Freq: Every day | ORAL | 0 refills | Status: DC

## 2017-06-10 NOTE — Progress Notes (Signed)
Pt having much more pain since stopping zytiga and prednisone. Needs more pain med. Pt having some nausea. No appetite except for sweets.

## 2017-06-10 NOTE — Progress Notes (Signed)
Hematology/Oncology Consult note [facility]  Telephone:([phone] [phone]  Patient Care Team: Patient, No Pcp Per as PCP - General (General Practice)   Name of the patient: Johnathan Gould  [id number]  1934/11/18   Date of visit: 06/10/17  Diagnosis- 1. 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 Anemia of chronic kidney disease  Chief complaint/ Reason for visit- routine f/u  Heme/Onc history:patient is a 82 year old male was recently admitted to the hospital on 10/16/2016 with symptoms of left hip pain and renal failure. He was found to have bilateral hydronephrosis and is status post bilateral nephrostomy tube placement. CT abdomen showed an irregular polypoid mass at the posterior aspect of the bladder highly concerning for bladder carcinoma. Extensive bulky adenopathy throughout the abdomen and pelvis consistent with nodal metastases. Widespread osseous metastatic disease. Bone scan showed multifocal areas of increased activity throughout the pelvis, bilateral femurs, rib cage, right scapula, thoracic cervical and lumbar spine consistent with metastatic disease. Patient was found to have enlarged abnormal prostate as well as an elevated PSA of 469 and was presumptively diagnosed with metastatic prostate cancer. He was started on Firmagon by urology. He was also seen by radiation oncology and started palliative radiation to his left hip on 11/04/2016 and finished 10 doses of palliative Rt.  He is currently on lupron through urology  4 core biopsy obtained for tissue diagnosis which showed: prostate adenocarcinoma gleasons 9 and 10 in all 4 cores  zytiga started in March 2018  Interval history- feels that his fatigue and back pain worse after he stopped zytiga. Tongue lesions are no better. They mainly bother him when he eats but not otheriwse  ECOG PS- 1 Pain scale- 5 Opioid associated constipation- no  Review of systems-  Review of Systems  Constitutional: Positive for malaise/fatigue. Negative for chills, fever and weight loss.  HENT: Negative for congestion, ear discharge and nosebleeds.   Eyes: Negative for blurred vision.  Respiratory: Negative for cough, hemoptysis, sputum production, shortness of breath and wheezing.   Cardiovascular: Negative for chest pain, palpitations, orthopnea and claudication.  Gastrointestinal: Negative for abdominal pain, blood in stool, constipation, diarrhea, heartburn, melena, nausea and vomiting.  Genitourinary: Negative for dysuria, flank pain, frequency, hematuria and urgency.  Musculoskeletal: Positive for back pain. Negative for joint pain and myalgias.  Skin: Negative for rash.  Neurological: Negative for dizziness, tingling, focal weakness, seizures, weakness and headaches.  Endo/Heme/Allergies: Does not bruise/bleed easily.  Psychiatric/Behavioral: Negative for depression and suicidal ideas. The patient does not have insomnia.       No Known Allergies   Past Medical History:  Diagnosis Date  . CAD (coronary artery disease)   . Cancer ([place])   . Hypertension   . RAD (reactive airway disease)      Past Surgical History:  Procedure Laterality Date  . CORONARY ANGIOPLASTY WITH STENT PLACEMENT    . IR GENERIC HISTORICAL  10/17/2016   IR NEPHROSTOMY PLACEMENT RIGHT 10/17/2016 [facility]INTER[facility]  . IR GENERIC HISTORICAL  10/17/2016   IR NEPHROSTOMY PLACEMENT LEFT 10/17/2016 [name], MD [facility]  . IR GENERIC HISTORICAL  12/23/2016   IR NEPHROSTOMY EXCHANGE LEFT 12/23/2016 [facility]  . IR GENERIC HISTORICAL  12/23/2016   IR NEPHROSTOMY EXCHANGE RIGHT 12/23/2016 [facility]  . IR NEPHROSTOMY EXCHANGE LEFT  02/11/2017  . IR NEPHROSTOMY EXCHANGE RIGHT  02/11/2017  . KNEE SURGERY Left 2006   Laparascopy done on left knee, scar tissue taken out  . nephrostomy  tubes Bilateral     Social History   Social History  . Marital status: Married     Spouse name: N/A  . Number of children: N/A  . Years of education: N/A   Occupational History  . Not on file.   Social History Main Topics  . Smoking status: Former Smoker    Packs/day: 1.00    Years: 69.50    Quit date: 11/04/2014  . Smokeless tobacco: Never Used  . Alcohol use No  . Drug use: No  . Sexual activity: Yes   Other Topics Concern  . Not on file   Social History Narrative  . No narrative on file    Family History  Problem Relation Age of Onset  . Hypertension Other   . Lung cancer Brother      Current Outpatient Prescriptions:  .  alendronate (FOSAMAX) 70 MG tablet, Take 1 tablet (70 mg total) by mouth once a week. Take with a full glass of water on an empty stomach., Disp: 4 tablet, Rfl: 3 .  clotrimazole (MYCELEX) 10 MG troche, Take 1 lozenge (10 mg total) by mouth 4 (four) times daily. Until white patches resolved in mouth, Disp: 70 lozenge, Rfl: 1 .  magnesium hydroxide (MILK OF MAGNESIA) 400 MG/5ML suspension, Take by mouth daily as needed for mild constipation., Disp: , Rfl:  .  metoprolol (LOPRESSOR) 50 MG tablet, Take 50 mg by mouth 2 (two) times daily. , Disp: , Rfl:  .  nitroGLYCERIN (NITROSTAT) 0.4 MG SL tablet, Place 0.4 mg under the tongue every 5 (five) minutes x 3 doses as needed for chest pain., Disp: , Rfl:  .  ondansetron (ZOFRAN) 4 MG tablet, Take 1 tablet (4 mg total) by mouth every 6 (six) hours as needed. for nausea, Disp: 40 tablet, Rfl: 1 .  Oxycodone HCl 10 MG TABS, Take 1 tablet (10 mg total) by mouth every 4 (four) hours as needed., Disp: 60 tablet, Rfl: 0 .  sennosides-docusate sodium (SENOKOT-S) 8.6-50 MG tablet, Take 2 tablets by mouth daily., Disp: , Rfl:  .  abiraterone Acetate (ZYTIGA) 250 MG tablet, Take 4 tablets (1,000 mg total) by mouth daily. Take on an empty stomach 1 hour before or 2 hours after a meal, Disp: 120 tablet, Rfl: 0 .  hydrochlorothiazide (HYDRODIURIL) 12.5 MG tablet, , Disp: , Rfl:  .  magic mouthwash SOLN, Take  5 mLs by mouth 4 (four) times daily as needed for mouth pain. (Patient not taking: Reported on 06/10/2017), Disp: 480 mL, Rfl: 0 .  polyethylene glycol (MIRALAX / GLYCOLAX) packet, Take 17 g by mouth daily as needed. , Disp: , Rfl:  .  predniSONE (DELTASONE) 5 MG tablet, Take 1 tablet (5 mg total) by mouth daily with breakfast., Disp: 30 tablet, Rfl: 0  Physical exam:  Vitals:   06/10/17 1116  BP: 127/75  Pulse: 61  Resp: 18  Temp: 97.8 F (36.6 C)  TempSrc: Tympanic  Weight: 185 lb (83.9 kg)  Height: 5\' 8"  (1.727 m)   Physical Exam  Constitutional: He is oriented to person, place, and time and well-developed, well-nourished, and in no distress.  HENT:  Head: Normocephalic and atraumatic.  Oval lesions noted over tongue. unchanged  Eyes: Pupils are equal, round, and reactive to light. EOM are normal.  Neck: Normal range of motion.  Cardiovascular: Normal rate, regular rhythm and normal heart sounds.   Pulmonary/Chest: Effort normal and breath sounds normal.  Abdominal: Soft. Bowel sounds are normal.  Neurological: He  is alert and oriented to person, place, and time.  Skin: Skin is warm and dry.     CMP Latest Ref Rng & Units 06/10/2017  Glucose 65 - 99 mg/dL 126(H)  BUN 6 - 20 mg/dL 42(H)  Creatinine 0.61 - 1.24 mg/dL 1.95(H)  Sodium 135 - 145 mmol/L 136  Potassium 3.5 - 5.1 mmol/L 4.3  Chloride 101 - 111 mmol/L 101  CO2 22 - 32 mmol/L 27  Calcium 8.9 - 10.3 mg/dL 9.0  Total Protein 6.5 - 8.1 g/dL 7.0  Total Bilirubin 0.3 - 1.2 mg/dL 0.4  Alkaline Phos 38 - 126 U/L 62  AST 15 - 41 U/L 17  ALT 17 - 63 U/L 11(L)   CBC Latest Ref Rng & Units 06/10/2017  WBC 3.8 - 10.6 K/uL 5.8  Hemoglobin 13.0 - 18.0 g/dL 10.6(L)  Hematocrit 40.0 - 52.0 % 31.8(L)  Platelets 150 - 440 K/uL 337     Assessment and plan- Patient is a 82 y.o. male with castrate sensitive high risk prostate cancer  Resume zytiga at this point since tongue lesions no better after holding the drug. Continue  prednisone 5 mg BID as well   anemia of chronic kindey disease- stable  ckd- stable  Tongue lesions- ?geographic tongue. Etiology unclear. Will give empiric trial of acyclovir. HSV testing was negative. Acyclovir 400 mg 5 times a day for 5 days  rc in 1 month with cbc, cmp and psa   Visit Diagnosis 1. Tongue ulceration   2. Prostate cancer ([place])   3. Bladder mass   4. Prostate cancer metastatic to bone [facility])      Dr. Randa Evens, MD, MPH [facility] Pager- [phone] 06/10/2017 1:24 PM

## 2017-07-06 ENCOUNTER — Other Ambulatory Visit: Payer: Self-pay | Admitting: *Deleted

## 2017-07-06 DIAGNOSIS — C7951 Secondary malignant neoplasm of bone: Principal | ICD-10-CM

## 2017-07-06 DIAGNOSIS — C61 Malignant neoplasm of prostate: Secondary | ICD-10-CM

## 2017-07-06 MED ORDER — ABIRATERONE ACETATE 250 MG PO TABS: 1000.0000 mg | ORAL_TABLET | Freq: Every day | ORAL | 0 refills | Status: DC

## 2017-07-08 ENCOUNTER — Inpatient Hospital Stay: Payer: Medicare Other | Attending: Oncology

## 2017-07-08 ENCOUNTER — Inpatient Hospital Stay (HOSPITAL_BASED_OUTPATIENT_CLINIC_OR_DEPARTMENT_OTHER): Payer: Medicare Other | Admitting: Oncology

## 2017-07-08 VITALS — BP 147/77 | HR 60 | Temp 97.6°F | Resp 18 | Wt 180.7 lb

## 2017-07-08 DIAGNOSIS — Z79899 Other long term (current) drug therapy: Secondary | ICD-10-CM | POA: Diagnosis not present

## 2017-07-08 DIAGNOSIS — K141 Geographic tongue: Secondary | ICD-10-CM | POA: Diagnosis not present

## 2017-07-08 DIAGNOSIS — Z801 Family history of malignant neoplasm of trachea, bronchus and lung: Secondary | ICD-10-CM

## 2017-07-08 DIAGNOSIS — J45909 Unspecified asthma, uncomplicated: Secondary | ICD-10-CM | POA: Insufficient documentation

## 2017-07-08 DIAGNOSIS — N1330 Unspecified hydronephrosis: Secondary | ICD-10-CM

## 2017-07-08 DIAGNOSIS — Z87891 Personal history of nicotine dependence: Secondary | ICD-10-CM

## 2017-07-08 DIAGNOSIS — D631 Anemia in chronic kidney disease: Secondary | ICD-10-CM | POA: Diagnosis not present

## 2017-07-08 DIAGNOSIS — G893 Neoplasm related pain (acute) (chronic): Secondary | ICD-10-CM | POA: Insufficient documentation

## 2017-07-08 DIAGNOSIS — I2510 Atherosclerotic heart disease of native coronary artery without angina pectoris: Secondary | ICD-10-CM

## 2017-07-08 DIAGNOSIS — M549 Dorsalgia, unspecified: Secondary | ICD-10-CM | POA: Insufficient documentation

## 2017-07-08 DIAGNOSIS — I129 Hypertensive chronic kidney disease with stage 1 through stage 4 chronic kidney disease, or unspecified chronic kidney disease: Secondary | ICD-10-CM

## 2017-07-08 DIAGNOSIS — C7951 Secondary malignant neoplasm of bone: Secondary | ICD-10-CM

## 2017-07-08 DIAGNOSIS — C61 Malignant neoplasm of prostate: Secondary | ICD-10-CM | POA: Diagnosis not present

## 2017-07-08 DIAGNOSIS — G4700 Insomnia, unspecified: Secondary | ICD-10-CM | POA: Diagnosis not present

## 2017-07-08 DIAGNOSIS — C779 Secondary and unspecified malignant neoplasm of lymph node, unspecified: Secondary | ICD-10-CM | POA: Insufficient documentation

## 2017-07-08 DIAGNOSIS — N189 Chronic kidney disease, unspecified: Secondary | ICD-10-CM

## 2017-07-08 DIAGNOSIS — N3289 Other specified disorders of bladder: Secondary | ICD-10-CM

## 2017-07-08 LAB — COMPREHENSIVE METABOLIC PANEL
ALK PHOS: 77 U/L (ref 38–126)
ALT: 12 U/L — AB (ref 17–63)
AST: 15 U/L (ref 15–41)
Albumin: 3.7 g/dL (ref 3.5–5.0)
Anion gap: 8 (ref 5–15)
BILIRUBIN TOTAL: 0.4 mg/dL (ref 0.3–1.2)
BUN: 45 mg/dL — AB (ref 6–20)
CALCIUM: 9.1 mg/dL (ref 8.9–10.3)
CHLORIDE: 99 mmol/L — AB (ref 101–111)
CO2: 28 mmol/L (ref 22–32)
CREATININE: 1.67 mg/dL — AB (ref 0.61–1.24)
GFR, EST AFRICAN AMERICAN: 43 mL/min — AB (ref >60)
GFR, EST NON AFRICAN AMERICAN: 37 mL/min — AB (ref >60)
Glucose, Bld: 147 mg/dL — ABNORMAL HIGH (ref 65–99)
Potassium: 4.1 mmol/L (ref 3.5–5.1)
Sodium: 135 mmol/L (ref 135–145)
Total Protein: 6.7 g/dL (ref 6.5–8.1)

## 2017-07-08 LAB — CBC WITH DIFFERENTIAL/PLATELET
BASOS ABS: 0 10*3/uL (ref 0–0.1)
Basophils Relative: 1 %
EOS PCT: 2 %
Eosinophils Absolute: 0.1 10*3/uL (ref 0–0.7)
HEMATOCRIT: 32.2 % — AB (ref 40.0–52.0)
HEMOGLOBIN: 10.8 g/dL — AB (ref 13.0–18.0)
LYMPHS ABS: 0.5 10*3/uL — AB (ref 1.0–3.6)
LYMPHS PCT: 8 %
MCH: 30.4 pg (ref 26.0–34.0)
MCHC: 33.7 g/dL (ref 32.0–36.0)
MCV: 90.3 fL (ref 80.0–100.0)
Monocytes Absolute: 0.4 10*3/uL (ref 0.2–1.0)
Monocytes Relative: 6 %
NEUTROS PCT: 83 %
Neutro Abs: 5.4 10*3/uL (ref 1.4–6.5)
Platelets: 314 10*3/uL (ref 150–440)
RBC: 3.57 MIL/uL — AB (ref 4.40–5.90)
RDW: 14.3 % (ref 11.5–14.5)
WBC: 6.5 10*3/uL (ref 3.8–10.6)

## 2017-07-08 LAB — PSA: PROSTATIC SPECIFIC ANTIGEN: 0.01 ng/mL (ref 0.00–4.00)

## 2017-07-08 MED ORDER — OXYCODONE HCL 10 MG PO TABS: 10.0000 mg | ORAL_TABLET | ORAL | 0 refills | Status: DC | PRN

## 2017-07-08 NOTE — Progress Notes (Signed)
Here for follow up. Stated pain w tongue ulcerations not relieved by pain meds . Lauren NP informed -as she is seeing pt

## 2017-07-08 NOTE — Progress Notes (Signed)
Hematology/Oncology Consult note [facility]  [phone] [phone]  Patient Care Team: Patient, No Pcp Per as PCP - General (General Practice)   Name of the patient: Johnathan Gould  [id number]  Jun 19, 1935   Date of visit: 07/08/17  Diagnosis- 1. 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 Anemia of chronic kidney disease  Chief complaint/ Reason for visit- routine f/u  Heme/Onc history:patient is a 82 year old male was recently admitted to the hospital on 10/16/2016 with symptoms of left hip pain and renal failure. He was found to have bilateral hydronephrosis and is status post bilateral nephrostomy tube placement. CT abdomen showed an irregular polypoid mass at the posterior aspect of the bladder highly concerning for bladder carcinoma. Extensive bulky adenopathy throughout the abdomen and pelvis consistent with nodal metastases. Widespread osseous metastatic disease. Bone scan showed multifocal areas of increased activity throughout the pelvis, bilateral femurs, rib cage, right scapula, thoracic cervical and lumbar spine consistent with metastatic disease. Patient was found to have enlarged abnormal prostate as well as an elevated PSA of 469 and was presumptively diagnosed with metastatic prostate cancer. He was started on Firmagon by urology. He was also seen by radiation oncology and started palliative radiation to his left hip on 11/04/2016 and finished 10 doses of palliative Rt.  He is currently on lupron through urology  4 core biopsy obtained for tissue diagnosis which showed: prostate adenocarcinoma gleasons 9 and 10 in all 4 cores  zytiga started in March 2018  Interval history- tongue lesions have not changed. His back pain continues to be a problem and he continues to have poor sleep which has been unchanged over his lifespan. He does feel pain has improved since re-starting Zytiga.   ECOG PS- 1 Pain scale-  5 Opioid associated constipation- no  Review of systems- Review of Systems  Constitutional: Positive for malaise/fatigue and weight loss. Negative for chills and fever.  HENT: Negative for congestion, ear discharge and nosebleeds.        Tongue pain & sensitivity  Eyes: Negative for blurred vision.  Respiratory: Negative for cough, hemoptysis, sputum production, shortness of breath and wheezing.   Cardiovascular: Negative for chest pain, palpitations, orthopnea, claudication and leg swelling.  Gastrointestinal: Negative for abdominal pain, blood in stool, constipation, diarrhea, heartburn, melena, nausea and vomiting.  Genitourinary: Negative for dysuria, flank pain, frequency, hematuria and urgency.  Musculoskeletal: Positive for back pain. Negative for joint pain and myalgias.  Skin: Negative for rash.  Neurological: Negative for dizziness, tingling, focal weakness, seizures, weakness and headaches.  Endo/Heme/Allergies: Does not bruise/bleed easily.  Psychiatric/Behavioral: Negative for depression and suicidal ideas. The patient has insomnia.     No Known Allergies  Past Medical History:  Diagnosis Date  . CAD (coronary artery disease)   . Cancer ([place])   . Hypertension   . RAD (reactive airway disease)    Past Surgical History:  Procedure Laterality Date  . CORONARY ANGIOPLASTY WITH STENT PLACEMENT    . IR GENERIC HISTORICAL  10/17/2016   IR NEPHROSTOMY PLACEMENT RIGHT 10/17/2016 [facility]INTER[facility]  . IR GENERIC HISTORICAL  10/17/2016   IR NEPHROSTOMY PLACEMENT LEFT 10/17/2016 [name], MD [facility]  . IR GENERIC HISTORICAL  12/23/2016   IR NEPHROSTOMY EXCHANGE LEFT 12/23/2016 [facility]  . IR GENERIC HISTORICAL  12/23/2016   IR NEPHROSTOMY EXCHANGE RIGHT 12/23/2016 [facility]  . IR NEPHROSTOMY EXCHANGE LEFT  02/11/2017  . IR NEPHROSTOMY EXCHANGE RIGHT  02/11/2017  . KNEE SURGERY  Left 2006   Laparascopy done on left knee, scar tissue taken out  . nephrostomy  tubes Bilateral    Social History   Social History  . Marital status: Married    Spouse name: N/A  . Number of children: N/A  . Years of education: N/A   Occupational History  . Not on file.   Social History Main Topics  . Smoking status: Former Smoker    Packs/day: 1.00    Years: 69.50    Quit date: 11/04/2014  . Smokeless tobacco: Never Used  . Alcohol use No  . Drug use: No  . Sexual activity: Yes   Other Topics Concern  . Not on file   Social History Narrative  . No narrative on file    Family History  Problem Relation Age of Onset  . Hypertension Other   . Lung cancer Brother      Current Outpatient Prescriptions:  .  abiraterone Acetate (ZYTIGA) 250 MG tablet, Take 4 tablets (1,000 mg total) by mouth daily. Take on an empty stomach 1 hour before or 2 hours after a meal, Disp: 120 tablet, Rfl: 0 .  acyclovir (ZOVIRAX) 400 MG tablet, Take 1 tablet (400 mg total) by mouth 5 (five) times daily., Disp: 25 tablet, Rfl: 0 .  alendronate (FOSAMAX) 70 MG tablet, Take 1 tablet (70 mg total) by mouth once a week. Take with a full glass of water on an empty stomach., Disp: 4 tablet, Rfl: 3 .  clotrimazole (MYCELEX) 10 MG troche, Take 1 lozenge (10 mg total) by mouth 4 (four) times daily. Until white patches resolved in mouth, Disp: 70 lozenge, Rfl: 1 .  hydrochlorothiazide (HYDRODIURIL) 12.5 MG tablet, , Disp: , Rfl:  .  metoprolol (LOPRESSOR) 50 MG tablet, Take 50 mg by mouth 2 (two) times daily. , Disp: , Rfl:  .  ondansetron (ZOFRAN) 4 MG tablet, Take 1 tablet (4 mg total) by mouth every 6 (six) hours as needed. for nausea, Disp: 40 tablet, Rfl: 1 .  Oxycodone HCl 10 MG TABS, Take 1 tablet (10 mg total) by mouth every 4 (four) hours as needed., Disp: 60 tablet, Rfl: 0 .  predniSONE (DELTASONE) 5 MG tablet, Take 1 tablet (5 mg total) by mouth daily with breakfast., Disp: 30 tablet, Rfl: 0 .  sennosides-docusate sodium (SENOKOT-S) 8.6-50 MG tablet, Take 2 tablets by mouth  daily., Disp: , Rfl:  .  magic mouthwash SOLN, Take 5 mLs by mouth 4 (four) times daily as needed for mouth pain. (Patient not taking: Reported on 06/10/2017), Disp: 480 mL, Rfl: 0 .  magnesium hydroxide (MILK OF MAGNESIA) 400 MG/5ML suspension, Take by mouth daily as needed for mild constipation., Disp: , Rfl:  .  nitroGLYCERIN (NITROSTAT) 0.4 MG SL tablet, Place 0.4 mg under the tongue every 5 (five) minutes x 3 doses as needed for chest pain., Disp: , Rfl:  .  polyethylene glycol (MIRALAX / GLYCOLAX) packet, Take 17 g by mouth daily as needed. , Disp: , Rfl:   Physical exam:  Vitals:   07/08/17 1137  BP: (!) 147/77  Pulse: 60  Resp: 18  Temp: 97.6 F (36.4 C)  TempSrc: Tympanic  Weight: 180 lb 11.2 oz (82 kg)   Physical Exam  Constitutional: He is oriented to person, place, and time and well-developed, well-nourished, and in no distress.  HENT:  Head: Normocephalic and atraumatic.  Oval lesions noted over tongue. unchanged  Eyes: Pupils are equal, round, and reactive to light. EOM are  normal.  Neck: Normal range of motion.  Cardiovascular: Normal rate, regular rhythm and normal heart sounds.   Pulmonary/Chest: Effort normal and breath sounds normal.  Abdominal: Soft. Bowel sounds are normal.  Musculoskeletal: He exhibits no edema.  Neurological: He is alert and oriented to person, place, and time.  Skin: Skin is warm and dry.     CMP Latest Ref Rng & Units 07/08/2017  Glucose 65 - 99 mg/dL 147(H)  BUN 6 - 20 mg/dL 45(H)  Creatinine 0.61 - 1.24 mg/dL 1.67(H)  Sodium 135 - 145 mmol/L 135  Potassium 3.5 - 5.1 mmol/L 4.1  Chloride 101 - 111 mmol/L 99(L)  CO2 22 - 32 mmol/L 28  Calcium 8.9 - 10.3 mg/dL 9.1  Total Protein 6.5 - 8.1 g/dL 6.7  Total Bilirubin 0.3 - 1.2 mg/dL 0.4  Alkaline Phos 38 - 126 U/L 77  AST 15 - 41 U/L 15  ALT 17 - 63 U/L 12(L)   CBC Latest Ref Rng & Units 07/08/2017  WBC 3.8 - 10.6 K/uL 6.5  Hemoglobin 13.0 - 18.0 g/dL 10.8(L)  Hematocrit 40.0 - 52.0 %  32.2(L)  Platelets 150 - 440 K/uL 314     Assessment and plan- Patient is a 82 y.o. male with castrate sensitive high risk prostate cancer. His PSA has been consistently low since treatment and he has no new symptoms. PSA today is pending. Will continue current regimen at this time.   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 Currently controlled with oxycodone 10mg  which he reports using 1-3 times a day. He has not had any problems with opioid induced constipation but takes miralax as needed. We will refill his oxycodone today.    Anemia of chronic kindey disease- stable  CKD- stable. Creatinine today 1.67.   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 Etiology unclear. Have tried acyclovir without improvement. HSV testing was negative. Magic mouthwash did not provide relief or improvement. Discussed referral to ENT or dentist or biopsy but patient was not interested at this time.   Will plan to see him back for lab only in 1 month, again in 2 months, then in 3 months for CBC, CMet, PSA, and to see MD.    Visit Diagnosis 1. Prostate cancer metastatic to bone ([place])   2. Prostate cancer [facility])   3. Bladder mass     Beckey Rutter, NP  07/08/17 12:29 PM  Dr. Randa Evens, MD, MPH [place] at [facility] Pager- [phone] 07/08/2017 12:28 PM

## 2017-08-03 ENCOUNTER — Inpatient Hospital Stay: Payer: Medicare Other | Attending: Oncology

## 2017-08-03 ENCOUNTER — Other Ambulatory Visit: Payer: Self-pay | Admitting: *Deleted

## 2017-08-03 DIAGNOSIS — C7951 Secondary malignant neoplasm of bone: Secondary | ICD-10-CM | POA: Diagnosis not present

## 2017-08-03 DIAGNOSIS — Z79818 Long term (current) use of other agents affecting estrogen receptors and estrogen levels: Secondary | ICD-10-CM | POA: Insufficient documentation

## 2017-08-03 DIAGNOSIS — C61 Malignant neoplasm of prostate: Secondary | ICD-10-CM | POA: Insufficient documentation

## 2017-08-03 DIAGNOSIS — N189 Chronic kidney disease, unspecified: Secondary | ICD-10-CM

## 2017-08-03 DIAGNOSIS — N3289 Other specified disorders of bladder: Secondary | ICD-10-CM

## 2017-08-03 DIAGNOSIS — D631 Anemia in chronic kidney disease: Secondary | ICD-10-CM

## 2017-08-03 MED ORDER — LEUPROLIDE ACETATE (3 MONTH) 22.5 MG IM KIT
22.5000 mg | PACK | Freq: Once | INTRAMUSCULAR | Status: AC
  Administered 2017-08-03: 22.5 mg via INTRAMUSCULAR
  Filled 2017-08-03: qty 22.5

## 2017-08-03 NOTE — Telephone Encounter (Signed)
Patient came to office this morning asking for refill on his Oxycodone and nausea medication and will pick it up on Friday.

## 2017-08-04 MED ORDER — ONDANSETRON HCL 4 MG PO TABS: 4.0000 mg | ORAL_TABLET | Freq: Four times a day (QID) | ORAL | 1 refills | Status: DC | PRN

## 2017-08-04 MED ORDER — OXYCODONE HCL 10 MG PO TABS: 10.0000 mg | ORAL_TABLET | ORAL | 0 refills | Status: DC | PRN

## 2017-08-05 ENCOUNTER — Inpatient Hospital Stay: Payer: Medicare Other

## 2017-08-05 DIAGNOSIS — C61 Malignant neoplasm of prostate: Secondary | ICD-10-CM

## 2017-08-05 DIAGNOSIS — C7951 Secondary malignant neoplasm of bone: Principal | ICD-10-CM

## 2017-08-05 LAB — COMPREHENSIVE METABOLIC PANEL
ALK PHOS: 86 U/L (ref 38–126)
ALT: 12 U/L — ABNORMAL LOW (ref 17–63)
ANION GAP: 9 (ref 5–15)
AST: 15 U/L (ref 15–41)
Albumin: 3.5 g/dL (ref 3.5–5.0)
BUN: 31 mg/dL — ABNORMAL HIGH (ref 6–20)
CALCIUM: 9.1 mg/dL (ref 8.9–10.3)
CO2: 29 mmol/L (ref 22–32)
Chloride: 103 mmol/L (ref 101–111)
Creatinine, Ser: 1.47 mg/dL — ABNORMAL HIGH (ref 0.61–1.24)
GFR calc non Af Amer: 43 mL/min — ABNORMAL LOW (ref >60)
GFR, EST AFRICAN AMERICAN: 50 mL/min — AB (ref >60)
Glucose, Bld: 90 mg/dL (ref 65–99)
POTASSIUM: 4.4 mmol/L (ref 3.5–5.1)
SODIUM: 141 mmol/L (ref 135–145)
TOTAL PROTEIN: 6.9 g/dL (ref 6.5–8.1)
Total Bilirubin: 0.3 mg/dL (ref 0.3–1.2)

## 2017-08-08 ENCOUNTER — Telehealth: Payer: Self-pay | Admitting: Oncology

## 2017-08-08 NOTE — Telephone Encounter (Addendum)
Oral Oncology Patient Advocate Encounter  Met patient in lobby to complete application for [employer] in an effort to reduce patient's out of pocket expense for Zytiga to $0.   Patient co-pay is $996.72 Application completed and faxed to [employer].   Patient assistance phone number for follow up is [phone].   This encounter will be updated until final determination.   [place] Patient Advocate [phone] 08/08/2017 8:55 AM

## 2017-08-09 ENCOUNTER — Other Ambulatory Visit: Payer: Self-pay | Admitting: *Deleted

## 2017-08-09 DIAGNOSIS — C61 Malignant neoplasm of prostate: Secondary | ICD-10-CM

## 2017-08-09 DIAGNOSIS — C7951 Secondary malignant neoplasm of bone: Principal | ICD-10-CM

## 2017-08-09 MED ORDER — PREDNISONE 5 MG PO TABS: 5.0000 mg | ORAL_TABLET | Freq: Every day | ORAL | 0 refills | Status: DC

## 2017-08-17 ENCOUNTER — Telehealth: Payer: Self-pay | Admitting: Pharmacist

## 2017-08-17 NOTE — Telephone Encounter (Signed)
Oral Chemotherapy Pharmacist Encounter  Called [employer] to check on the status of Mr. Guardia application. [employer] stated that that needed to verify Mr. Bastin out of pocket prescription spending for the year. If it was more than $1138.16, he will be approved.  Patient needs to call the customer service phone number on the back of his pharmacy insurance card and ask for a report of his out of pocket prescription spending. If the patient receives a monthly report of this from his Medicare Part D plan, that could be provided to [employer] instead.   Attempted to call Mr. Dyar and share this information, no answer LVM. Patient should have received a letter from [facility] stating the additional information they needed.  Thank you,  Darl Pikes, PharmD, BCPS Hematology/Oncology Clinical Pharmacist [facility] [phone]  08/17/2017 2:23 PM

## 2017-08-17 NOTE — Telephone Encounter (Signed)
Oral Chemotherapy Pharmacist Encounter  Spoke with patient, he will bring by needed paperwork tomorrow morning.   Darl Pikes, PharmD, BCPS Hematology/Oncology Clinical Pharmacist [facility] [place] [facility] [phone]  08/17/2017 3:15 PM

## 2017-08-18 NOTE — Telephone Encounter (Signed)
Oral Chemotherapy Pharmacist Encounter  Met with Mr. Niemann in the lobby this morning. He brought in needed out-of-pocket documentation needed for [employer] application.   Faxed paperwork over to [employer] (fax: [phone])  Will continue to follow application status.  Darl Pikes, PharmD, BCPS Hematology/Oncology Clinical Pharmacist [facility] [phone]  08/18/2017 9:40 AM

## 2017-08-18 NOTE — Telephone Encounter (Signed)
Oral Chemotherapy Pharmacist Encounter  Fax received by [employer]. They stated it would take 2 business days to process.  Darl Pikes, PharmD, BCPS Hematology/Oncology Clinical Pharmacist [facility] [phone]  08/18/2017 2:00 PM

## 2017-08-24 NOTE — Telephone Encounter (Signed)
Oral Oncology Patient Advocate Encounter  Called [employer] to see if patient had been approved yet. They said they would try to process and match up paper work. Will call again tomorrow.    [place] Patient Advocate [phone] 08/24/2017 4:08 PM

## 2017-08-29 ENCOUNTER — Telehealth: Payer: Self-pay | Admitting: Oncology

## 2017-08-29 NOTE — Telephone Encounter (Signed)
Oral Oncology Patient Advocate Encounter   Called [place] to check on approval. Per Larena Glassman P. Should be approved Tuesday 08/30/2017. Will call to again tomorrow.  [place] Patient Advocate [phone] 08/29/2017 9:49 AM

## 2017-09-01 ENCOUNTER — Telehealth: Payer: Self-pay | Admitting: Oncology

## 2017-09-01 NOTE — Telephone Encounter (Signed)
Oral Oncology Patient Advocate Encounter  [employer] [place] about patients application. She said it would be three to five days. There computer has been down. I asked her to please rush his application it has been since 08/08/2017.  Called patient and left him a message about his application. That I will continue to follow up.   [place] Patient Advocate [phone] 09/01/2017 4:42 PM

## 2017-09-02 ENCOUNTER — Other Ambulatory Visit: Payer: Self-pay | Admitting: *Deleted

## 2017-09-02 ENCOUNTER — Other Ambulatory Visit: Payer: Self-pay

## 2017-09-02 ENCOUNTER — Inpatient Hospital Stay: Payer: Medicare Other | Attending: Oncology

## 2017-09-02 DIAGNOSIS — C61 Malignant neoplasm of prostate: Secondary | ICD-10-CM | POA: Insufficient documentation

## 2017-09-02 DIAGNOSIS — C7951 Secondary malignant neoplasm of bone: Secondary | ICD-10-CM

## 2017-09-02 DIAGNOSIS — N3289 Other specified disorders of bladder: Secondary | ICD-10-CM

## 2017-09-02 LAB — COMPREHENSIVE METABOLIC PANEL
ALT: 15 U/L — ABNORMAL LOW (ref 17–63)
AST: 22 U/L (ref 15–41)
Albumin: 3.5 g/dL (ref 3.5–5.0)
Alkaline Phosphatase: 74 U/L (ref 38–126)
Anion gap: 9 (ref 5–15)
BUN: 32 mg/dL — ABNORMAL HIGH (ref 6–20)
CHLORIDE: 102 mmol/L (ref 101–111)
CO2: 25 mmol/L (ref 22–32)
Calcium: 8.6 mg/dL — ABNORMAL LOW (ref 8.9–10.3)
Creatinine, Ser: 1.67 mg/dL — ABNORMAL HIGH (ref 0.61–1.24)
GFR calc Af Amer: 43 mL/min — ABNORMAL LOW (ref >60)
GFR, EST NON AFRICAN AMERICAN: 37 mL/min — AB (ref >60)
Glucose, Bld: 121 mg/dL — ABNORMAL HIGH (ref 65–99)
POTASSIUM: 3.6 mmol/L (ref 3.5–5.1)
Sodium: 136 mmol/L (ref 135–145)
Total Bilirubin: 0.4 mg/dL (ref 0.3–1.2)
Total Protein: 6.9 g/dL (ref 6.5–8.1)

## 2017-09-02 MED ORDER — OXYCODONE HCL 10 MG PO TABS: 10.0000 mg | ORAL_TABLET | ORAL | 0 refills | Status: DC | PRN

## 2017-09-02 NOTE — Telephone Encounter (Signed)
Pt called for refill of pain med. Oxycodone rx given.

## 2017-09-06 ENCOUNTER — Telehealth: Payer: Self-pay | Admitting: Oncology

## 2017-09-06 NOTE — Telephone Encounter (Signed)
Oral Oncology Patient Advocate Encounter  Received notification from [facility] Patient Assistance program that patient has been successfully enrolled into their program to receive Zytiga from the manufacturer at $0 out of pocket until end of year.  Patients medication will come from [facility].  I called and spoke with patient.  He knows we will have to re-apply.   Patient knows to call the office with questions or concerns.  Oral Oncology Clinic will continue to follow.  [place] Patient Advocate [phone] 09/06/2017 10:16 AM

## 2017-09-09 NOTE — Telephone Encounter (Signed)
Oral Oncology Patient Advocate Encounter  Spoke with Tonye Becket. from [place] and she is trying to get in touch with patient to get his order overnighted to deliver Saturday.   Called patient to let him know to answer the phone.    [place] Patient Advocate [phone] 09/09/2017 3:16 PM

## 2017-09-12 ENCOUNTER — Other Ambulatory Visit: Payer: Self-pay | Admitting: *Deleted

## 2017-09-12 DIAGNOSIS — C61 Malignant neoplasm of prostate: Secondary | ICD-10-CM

## 2017-09-12 DIAGNOSIS — C7951 Secondary malignant neoplasm of bone: Principal | ICD-10-CM

## 2017-09-12 MED ORDER — PREDNISONE 5 MG PO TABS: 5.0000 mg | ORAL_TABLET | Freq: Two times a day (BID) | ORAL | 0 refills | Status: DC

## 2017-09-12 NOTE — Telephone Encounter (Signed)
Oral Chemotherapy Pharmacist Encounter  Johnathan Gould needs Rx sent to local pharmacy for prednisone to go along with his Zytiga. Informed RN Judeen Hammans.  Darl Pikes, PharmD, BCPS Hematology/Oncology Clinical Pharmacist [facility] [phone]  09/12/2017 11:54 AM

## 2017-09-30 ENCOUNTER — Ambulatory Visit: Payer: Medicare Other | Admitting: Urology

## 2017-09-30 ENCOUNTER — Encounter: Payer: Self-pay | Admitting: Urology

## 2017-09-30 VITALS — BP 97/59 | HR 84 | Ht 68.0 in | Wt 175.0 lb

## 2017-09-30 DIAGNOSIS — N3943 Post-void dribbling: Secondary | ICD-10-CM

## 2017-09-30 DIAGNOSIS — N183 Chronic kidney disease, stage 3 unspecified: Secondary | ICD-10-CM

## 2017-09-30 DIAGNOSIS — C61 Malignant neoplasm of prostate: Secondary | ICD-10-CM

## 2017-09-30 DIAGNOSIS — N139 Obstructive and reflux uropathy, unspecified: Secondary | ICD-10-CM | POA: Diagnosis not present

## 2017-09-30 LAB — BLADDER SCAN AMB NON-IMAGING

## 2017-09-30 MED ORDER — TAMSULOSIN HCL 0.4 MG PO CAPS: 0.4000 mg | ORAL_CAPSULE | Freq: Every day | ORAL | 11 refills | Status: AC

## 2017-09-30 NOTE — Addendum Note (Signed)
Addended by: Tommy Rainwater on: 09/30/2017 04:16 PM   Modules accepted: Orders

## 2017-09-30 NOTE — Progress Notes (Signed)
09/30/2017 9:58 AM   Johnathan Gould 21-Apr-1935 [id number]  Referring provider: No referring provider defined for this encounter.  Chief Complaint  Patient presents with  . Prostate Cancer    1month    HPI: 82 year old male with with metastatic prostate cancer who returns today for routine follow-up.    He initially presented to the emergency room on 10/16/2016 with severe left hip pain and renal failure. He is on bilateral hydronephrosis, extensive bulky adenopathy threat the abdomen pelvis, locally advanced prostate cancer, and widespread osseous metastatic disease. At the time of presentation his PSA was 469. He underwent placement of bilateral nephrostomy tubes and his creatinine improved.  As he responded to ADT, he underwent clamping trials and ultimately his nephrostomy tubes were removed.  He is now on ADT and has been seen and evaluated by Dr. [name] who has started him on the Zytiga/ prednisone in addition to lupron.    He did also receive 10 fractions of palliative radiation to his right hip.  Most recent PSA 0.01 down from 469 as of 07/2017.  Most recent creatinine 1.67 on 09/2017 which is his new baseline.    Most recent cross-sectional imaging on 04/2017 shows excellent response to ADT with reduction in his pathologic adenopathy.  His hydronephrosis has resolved.  His prostate size is also decreased significantly.  He reports that is has a good stream but goes down to a trickle to empty with post void dribbling.  He is concerned that he is not emptying his bladder.  He has to sit to void which is bothersome to time.  Other than ADT/ Fabio Asa, he is not currently on any BPH meds.    PMH: Past Medical History:  Diagnosis Date  . CAD (coronary artery disease)   . Cancer ([place])   . Hypertension   . RAD (reactive airway disease)     Surgical History: Past Surgical History:  Procedure Laterality Date  . CORONARY ANGIOPLASTY WITH STENT PLACEMENT    . IR GENERIC  HISTORICAL  10/17/2016   IR NEPHROSTOMY PLACEMENT RIGHT 10/17/2016 [facility]  . IR GENERIC HISTORICAL  10/17/2016   IR NEPHROSTOMY PLACEMENT LEFT 10/17/2016 [name], MD [facility]  . IR GENERIC HISTORICAL  12/23/2016   IR NEPHROSTOMY EXCHANGE LEFT 12/23/2016 [facility]  . IR GENERIC HISTORICAL  12/23/2016   IR NEPHROSTOMY EXCHANGE RIGHT 12/23/2016 [facility]  . IR NEPHROSTOMY EXCHANGE LEFT  02/11/2017  . IR NEPHROSTOMY EXCHANGE RIGHT  02/11/2017  . KNEE SURGERY Left 2006   Laparascopy done on left knee, scar tissue taken out  . nephrostomy tubes Bilateral     Home Medications:  Allergies as of 09/30/2017   No Known Allergies     Medication List        Accurate as of 09/30/17  9:58 AM. Always use your most recent med list.          abiraterone acetate 250 MG tablet Commonly known as:  ZYTIGA Take 4 tablets (1,000 mg total) by mouth daily. Take on an empty stomach 1 hour before or 2 hours after a meal   acyclovir 400 MG tablet Commonly known as:  ZOVIRAX Take 1 tablet (400 mg total) by mouth 5 (five) times daily.   alendronate 70 MG tablet Commonly known as:  FOSAMAX Take 1 tablet (70 mg total) by mouth once a week. Take with a full glass of water on an empty stomach.   aspirin EC 81 MG tablet Take by mouth.  clotrimazole 10 MG troche Commonly known as:  MYCELEX Take 1 lozenge (10 mg total) by mouth 4 (four) times daily. Until white patches resolved in mouth   hydrochlorothiazide 12.5 MG tablet Commonly known as:  HYDRODIURIL   metoprolol tartrate 50 MG tablet Commonly known as:  LOPRESSOR Take 50 mg by mouth 2 (two) times daily.   nitroGLYCERIN 0.4 MG SL tablet Commonly known as:  NITROSTAT Place 0.4 mg under the tongue every 5 (five) minutes x 3 doses as needed for chest pain.   ondansetron 4 MG tablet Commonly known as:  ZOFRAN Take 1 tablet (4 mg total) by mouth every 6 (six) hours as needed. for nausea   Oxycodone HCl 10 MG  Tabs Take 1 tablet (10 mg total) by mouth every 4 (four) hours as needed.   predniSONE 5 MG tablet Commonly known as:  DELTASONE Take 1 tablet (5 mg total) 2 (two) times daily with a meal by mouth.   sennosides-docusate sodium 8.6-50 MG tablet Commonly known as:  SENOKOT-S Take 2 tablets by mouth daily.   tamsulosin 0.4 MG Caps capsule Commonly known as:  FLOMAX Take 1 capsule (0.4 mg total) by mouth daily.       Allergies: No Known Allergies  Family History: Family History  Problem Relation Age of Onset  . Hypertension Other   . Lung cancer Brother     Social History:  reports that he quit smoking about 2 years ago. He has a 69.50 pack-year smoking history. he has never used smokeless tobacco. He reports that he does not drink alcohol or use drugs.  ROS: UROLOGY Frequent Urination?: No Hard to postpone urination?: No Burning/pain with urination?: No Get up at night to urinate?: No Leakage of urine?: No Urine stream starts and stops?: No Trouble starting stream?: No Do you have to strain to urinate?: No Blood in urine?: No Urinary tract infection?: No Sexually transmitted disease?: No Injury to kidneys or bladder?: No Painful intercourse?: No Weak stream?: No Erection problems?: No Penile pain?: No  Gastrointestinal Nausea?: No Vomiting?: No Indigestion/heartburn?: No Diarrhea?: No Constipation?: No  Constitutional Fever: No Night sweats?: No Weight loss?: No Fatigue?: No  Skin Skin rash/lesions?: No Itching?: No  Eyes Blurred vision?: No Double vision?: No  Ears/Nose/Throat Sore throat?: No Sinus problems?: No  Hematologic/Lymphatic Swollen glands?: No Easy bruising?: No  Cardiovascular Leg swelling?: No Chest pain?: No  Respiratory Cough?: No Shortness of breath?: No  Endocrine Excessive thirst?: No  Musculoskeletal Back pain?: No Joint pain?: No  Neurological Headaches?: No Dizziness?: No  Psychologic Depression?:  No Anxiety?: No  Physical Exam: BP (!) 97/59   Pulse 84   Ht 5\' 8"  (1.727 m)   Wt 175 lb (79.4 kg)   BMI 26.61 kg/m   Constitutional:  Alert and oriented, No acute distress.  Wife present today. HEENT: [place] AT, moist mucus membranes.  Trachea midline, no masses. Cardiovascular: No clubbing, cyanosis, or edema. Respiratory: Normal respiratory effort, no increased work of breathing. GI: Abdomen is soft, nontender, nondistended, no abdominal masses Skin: No rashes, bruises or suspicious lesions. Neurologic: Grossly intact, no focal deficits, moving all 4 extremities. Psychiatric: Normal mood and affect.  Laboratory Data: Lab Results  Component Value Date   WBC 6.5 07/08/2017   HGB 10.8 (L) 07/08/2017   HCT 32.2 (L) 07/08/2017   MCV 90.3 07/08/2017   PLT 314 07/08/2017    Lab Results  Component Value Date   CREATININE 1.67 (H) 09/02/2017    Lab  Results  Component Value Date   PSA 0.05 03/01/2017   PSA 0.09 02/01/2017   PSA 0.09 01/18/2017    Lab Results  Component Value Date   TESTOSTERONE <3 (L) 04/12/2017    Urinalysis N/a  Pertinent Imaging: PVR 0  Assessment & Plan:    1. Prostate cancer ([place]) On Lupron/ Zytiga, prednisone managed by Dr. [name] PSA nearly undetectable Excellent biochemical and radiologic response  2. Urinary obstruction/ post void dribbling No further evidence of ureteral obstruction Complaints today of urinary symptoms including sensation of incomplete bladder emptying, urinary dribbling Post void residual minimal Prescribed Flomax 0.4 mg today to see if this helps with his urinary symptoms  3. CKD Creatinine stable, at new baseline   Return in about 6 months (around 03/30/2018) for IPSS/ PVR.  Hollice Espy, MD  [facility] [address] [place], [place] [address] [phone]

## 2017-10-05 ENCOUNTER — Telehealth: Payer: Self-pay | Admitting: Oncology

## 2017-10-05 NOTE — Telephone Encounter (Signed)
Oral Oncology Patient Advocate Encounter  Called patient to ask him to get with me when he comes in on 10/07/2017 to see Dr. [name], so we can re-enroll with [employer] for his Zytiga for 2019.   I will fax the re-enrollment form to [facility]  [phone].   [place] Patient Advocate [phone] 10/05/2017 2:02 PM

## 2017-10-07 ENCOUNTER — Other Ambulatory Visit: Payer: Self-pay

## 2017-10-07 ENCOUNTER — Inpatient Hospital Stay: Payer: Medicare Other | Attending: Oncology

## 2017-10-07 ENCOUNTER — Other Ambulatory Visit: Payer: Self-pay | Admitting: *Deleted

## 2017-10-07 ENCOUNTER — Inpatient Hospital Stay (HOSPITAL_BASED_OUTPATIENT_CLINIC_OR_DEPARTMENT_OTHER): Payer: Medicare Other | Admitting: Oncology

## 2017-10-07 ENCOUNTER — Encounter: Payer: Self-pay | Admitting: Oncology

## 2017-10-07 VITALS — BP 148/88 | HR 91 | Temp 98.2°F | Resp 18 | Wt 185.8 lb

## 2017-10-07 DIAGNOSIS — C61 Malignant neoplasm of prostate: Secondary | ICD-10-CM | POA: Diagnosis not present

## 2017-10-07 DIAGNOSIS — Z87891 Personal history of nicotine dependence: Secondary | ICD-10-CM | POA: Diagnosis not present

## 2017-10-07 DIAGNOSIS — M810 Age-related osteoporosis without current pathological fracture: Secondary | ICD-10-CM

## 2017-10-07 DIAGNOSIS — Z79899 Other long term (current) drug therapy: Secondary | ICD-10-CM | POA: Diagnosis not present

## 2017-10-07 DIAGNOSIS — M545 Low back pain: Secondary | ICD-10-CM | POA: Diagnosis not present

## 2017-10-07 DIAGNOSIS — Z923 Personal history of irradiation: Secondary | ICD-10-CM | POA: Insufficient documentation

## 2017-10-07 DIAGNOSIS — I2510 Atherosclerotic heart disease of native coronary artery without angina pectoris: Secondary | ICD-10-CM

## 2017-10-07 DIAGNOSIS — C7951 Secondary malignant neoplasm of bone: Secondary | ICD-10-CM

## 2017-10-07 DIAGNOSIS — Z7982 Long term (current) use of aspirin: Secondary | ICD-10-CM

## 2017-10-07 DIAGNOSIS — N189 Chronic kidney disease, unspecified: Secondary | ICD-10-CM | POA: Insufficient documentation

## 2017-10-07 DIAGNOSIS — K141 Geographic tongue: Secondary | ICD-10-CM

## 2017-10-07 DIAGNOSIS — D631 Anemia in chronic kidney disease: Secondary | ICD-10-CM

## 2017-10-07 DIAGNOSIS — I129 Hypertensive chronic kidney disease with stage 1 through stage 4 chronic kidney disease, or unspecified chronic kidney disease: Secondary | ICD-10-CM | POA: Insufficient documentation

## 2017-10-07 DIAGNOSIS — N3289 Other specified disorders of bladder: Secondary | ICD-10-CM

## 2017-10-07 DIAGNOSIS — J45909 Unspecified asthma, uncomplicated: Secondary | ICD-10-CM | POA: Diagnosis not present

## 2017-10-07 LAB — COMPREHENSIVE METABOLIC PANEL
ALBUMIN: 3.7 g/dL (ref 3.5–5.0)
ALK PHOS: 84 U/L (ref 38–126)
ALT: 12 U/L — AB (ref 17–63)
AST: 16 U/L (ref 15–41)
Anion gap: 9 (ref 5–15)
BUN: 42 mg/dL — AB (ref 6–20)
CHLORIDE: 99 mmol/L — AB (ref 101–111)
CO2: 28 mmol/L (ref 22–32)
CREATININE: 1.48 mg/dL — AB (ref 0.61–1.24)
Calcium: 9.1 mg/dL (ref 8.9–10.3)
GFR calc Af Amer: 50 mL/min — ABNORMAL LOW (ref >60)
GFR, EST NON AFRICAN AMERICAN: 43 mL/min — AB (ref >60)
Glucose, Bld: 117 mg/dL — ABNORMAL HIGH (ref 65–99)
Potassium: 3.6 mmol/L (ref 3.5–5.1)
Sodium: 136 mmol/L (ref 135–145)
Total Bilirubin: 0.4 mg/dL (ref 0.3–1.2)
Total Protein: 7.2 g/dL (ref 6.5–8.1)

## 2017-10-07 LAB — CBC WITH DIFFERENTIAL/PLATELET
BASOS PCT: 1 %
Basophils Absolute: 0 10*3/uL (ref 0–0.1)
EOS ABS: 0.1 10*3/uL (ref 0–0.7)
Eosinophils Relative: 2 %
HEMATOCRIT: 33.7 % — AB (ref 40.0–52.0)
Hemoglobin: 11.1 g/dL — ABNORMAL LOW (ref 13.0–18.0)
LYMPHS ABS: 0.7 10*3/uL — AB (ref 1.0–3.6)
Lymphocytes Relative: 11 %
MCH: 29.6 pg (ref 26.0–34.0)
MCHC: 33 g/dL (ref 32.0–36.0)
MCV: 89.7 fL (ref 80.0–100.0)
MONO ABS: 0.5 10*3/uL (ref 0.2–1.0)
MONOS PCT: 8 %
NEUTROS ABS: 4.8 10*3/uL (ref 1.4–6.5)
Neutrophils Relative %: 78 %
Platelets: 275 10*3/uL (ref 150–440)
RBC: 3.76 MIL/uL — ABNORMAL LOW (ref 4.40–5.90)
RDW: 15.2 % — AB (ref 11.5–14.5)
WBC: 6 10*3/uL (ref 3.8–10.6)

## 2017-10-07 LAB — PSA: Prostatic Specific Antigen: 0.01 ng/mL (ref 0.00–4.00)

## 2017-10-07 MED ORDER — ALENDRONATE SODIUM 70 MG PO TABS: 70.0000 mg | ORAL_TABLET | ORAL | 6 refills | Status: DC

## 2017-10-07 MED ORDER — ONDANSETRON HCL 4 MG PO TABS: 4.0000 mg | ORAL_TABLET | Freq: Four times a day (QID) | ORAL | 1 refills | Status: DC | PRN

## 2017-10-07 MED ORDER — OXYCODONE HCL 10 MG PO TABS: 10.0000 mg | ORAL_TABLET | ORAL | 0 refills | Status: DC | PRN

## 2017-10-07 NOTE — Progress Notes (Signed)
Here for follow up

## 2017-10-07 NOTE — Progress Notes (Signed)
Hematology/Oncology Consult note [facility]  [phone] [phone]  Patient Care Team: [name], MD as PCP - General (Internal Medicine)   Name of the patient: Johnathan Gould  [id number]  1935/01/08   Date of visit: 10/07/17  Diagnosis- 1. 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 Anemia of chronic kidney disease  Chief complaint/ Reason for visit- routine f/u  Heme/Onc history:patient is a 82 year old male was recently admitted to the hospital on 10/16/2016 with symptoms of left hip pain and renal failure. He was found to have bilateral hydronephrosis and is status post bilateral nephrostomy tube placement. CT abdomen showed an irregular polypoid mass at the posterior aspect of the bladder highly concerning for bladder carcinoma. Extensive bulky adenopathy throughout the abdomen and pelvis consistent with nodal metastases. Widespread osseous metastatic disease. Bone scan showed multifocal areas of increased activity throughout the pelvis, bilateral femurs, rib cage, right scapula, thoracic cervical and lumbar spine consistent with metastatic disease. Patient was found to have enlarged abnormal prostate as well as an elevated PSA of 469 and was presumptively diagnosed with metastatic prostate cancer. He was started on Firmagon by urology. He was also seen by radiation oncology and started palliative radiation to his left hip on 11/04/2016 and finished 10 doses of palliative Rt.  He is currently on lupron through [place] every 3 months  4 core biopsy obtained for tissue diagnosis which showed: prostate adenocarcinoma gleasons 9 and 10 in all 4 cores  zytiga started in March 2018   Interval history-patient has on and off back pain for which she takes 1 or 2 doses of oxycodone.  He continues to have some pain in his tongue due to his geographic tongue and nothing that was tried in the past has helped him.  He says he has learned  to live with it.  Denies any fatigue constipation diarrhea nausea or vomiting  ECOG PS- 1 Pain scale- 5 Opioid associated constipation- no  Review of systems- Review of Systems  Constitutional: Negative for chills, fever, malaise/fatigue and weight loss.  HENT: Negative for congestion, ear discharge and nosebleeds.        Tongue pain and discomfort due to geographic tongue  Eyes: Negative for blurred vision.  Respiratory: Negative for cough, hemoptysis, sputum production, shortness of breath and wheezing.   Cardiovascular: Negative for chest pain, palpitations, orthopnea and claudication.  Gastrointestinal: Negative for abdominal pain, blood in stool, constipation, diarrhea, heartburn, melena, nausea and vomiting.  Genitourinary: Negative for dysuria, flank pain, frequency, hematuria and urgency.  Musculoskeletal: Positive for back pain. Negative for joint pain and myalgias.  Skin: Negative for rash.  Neurological: Negative for dizziness, tingling, focal weakness, seizures, weakness and headaches.  Endo/Heme/Allergies: Does not bruise/bleed easily.  Psychiatric/Behavioral: Negative for depression and suicidal ideas. The patient does not have insomnia.       No Known Allergies   Past Medical History:  Diagnosis Date  . CAD (coronary artery disease)   . Cancer ([place])   . Hypertension   . RAD (reactive airway disease)      Past Surgical History:  Procedure Laterality Date  . CORONARY ANGIOPLASTY WITH STENT PLACEMENT    . IR GENERIC HISTORICAL  10/17/2016   IR NEPHROSTOMY PLACEMENT RIGHT 10/17/2016 [facility]  . IR GENERIC HISTORICAL  10/17/2016   IR NEPHROSTOMY PLACEMENT LEFT 10/17/2016 [name], MD [facility]  . IR GENERIC HISTORICAL  12/23/2016   IR NEPHROSTOMY EXCHANGE LEFT 12/23/2016 [facility]  . IR  GENERIC HISTORICAL  12/23/2016   IR NEPHROSTOMY EXCHANGE RIGHT 12/23/2016 [facility]  . IR NEPHROSTOMY EXCHANGE LEFT  02/11/2017  . IR NEPHROSTOMY  EXCHANGE RIGHT  02/11/2017  . KNEE SURGERY Left 2006   Laparascopy done on left knee, scar tissue taken out  . nephrostomy tubes Bilateral     Social History   Socioeconomic History  . Marital status: Married    Spouse name: Not on file  . Number of children: Not on file  . Years of education: Not on file  . Highest education level: Not on file  Social Needs  . Financial resource strain: Not on file  . Food insecurity - worry: Not on file  . Food insecurity - inability: Not on file  . Transportation needs - medical: Not on file  . Transportation needs - non-medical: Not on file  Occupational History  . Not on file  Tobacco Use  . Smoking status: Former Smoker    Packs/day: 1.00    Years: 69.50    Pack years: 69.50    Last attempt to quit: 11/04/2014    Years since quitting: 2.9  . Smokeless tobacco: Never Used  Substance and Sexual Activity  . Alcohol use: No  . Drug use: No  . Sexual activity: Yes  Other Topics Concern  . Not on file  Social History Narrative  . Not on file    Family History  Problem Relation Age of Onset  . Hypertension Other   . Lung cancer Brother      Current Outpatient Medications:  .  abiraterone Acetate (ZYTIGA) 250 MG tablet, Take 4 tablets (1,000 mg total) by mouth daily. Take on an empty stomach 1 hour before or 2 hours after a meal, Disp: 120 tablet, Rfl: 0 .  acyclovir (ZOVIRAX) 400 MG tablet, Take 1 tablet (400 mg total) by mouth 5 (five) times daily., Disp: 25 tablet, Rfl: 0 .  aspirin EC 81 MG tablet, Take by mouth., Disp: , Rfl:  .  clotrimazole (MYCELEX) 10 MG troche, Take 1 lozenge (10 mg total) by mouth 4 (four) times daily. Until white patches resolved in mouth, Disp: 70 lozenge, Rfl: 1 .  hydrochlorothiazide (HYDRODIURIL) 12.5 MG tablet, , Disp: , Rfl:  .  metoprolol (LOPRESSOR) 50 MG tablet, Take 50 mg by mouth 2 (two) times daily. , Disp: , Rfl:  .  predniSONE (DELTASONE) 5 MG tablet, Take 1 tablet (5 mg total) 2 (two) times  daily with a meal by mouth., Disp: 60 tablet, Rfl: 0 .  sennosides-docusate sodium (SENOKOT-S) 8.6-50 MG tablet, Take 2 tablets by mouth daily., Disp: , Rfl:  .  tamsulosin (FLOMAX) 0.4 MG CAPS capsule, Take 1 capsule (0.4 mg total) by mouth daily., Disp: 30 capsule, Rfl: 11 .  alendronate (FOSAMAX) 70 MG tablet, Take 1 tablet (70 mg total) by mouth once a week. Take with a full glass of water on an empty stomach., Disp: 4 tablet, Rfl: 6 .  nitroGLYCERIN (NITROSTAT) 0.4 MG SL tablet, Place 0.4 mg under the tongue every 5 (five) minutes x 3 doses as needed for chest pain., Disp: , Rfl:  .  ondansetron (ZOFRAN) 4 MG tablet, Take 1 tablet (4 mg total) by mouth every 6 (six) hours as needed. for nausea, Disp: 40 tablet, Rfl: 1 .  Oxycodone HCl 10 MG TABS, Take 1 tablet (10 mg total) by mouth every 4 (four) hours as needed., Disp: 60 tablet, Rfl: 0  Physical exam:  Vitals:   10/07/17 1140  BP: (!) 148/88  Pulse: 91  Resp: 18  Temp: 98.2 F (36.8 C)  TempSrc: Tympanic  Weight: 185 lb 12.8 oz (84.3 kg)   Physical Exam  Constitutional: He is oriented to person, place, and time and well-developed, well-nourished, and in no distress.  HENT:  Head: Normocephalic and atraumatic.  Geographic tongue  Eyes: EOM are normal. Pupils are equal, round, and reactive to light.  Neck: Normal range of motion.  Cardiovascular: Normal rate, regular rhythm and normal heart sounds.  Pulmonary/Chest: Effort normal and breath sounds normal.  Abdominal: Soft. Bowel sounds are normal.  Neurological: He is alert and oriented to person, place, and time.  Skin: Skin is warm and dry.     CMP Latest Ref Rng & Units 10/07/2017  Glucose 65 - 99 mg/dL 117(H)  BUN 6 - 20 mg/dL 42(H)  Creatinine 0.61 - 1.24 mg/dL 1.48(H)  Sodium 135 - 145 mmol/L 136  Potassium 3.5 - 5.1 mmol/L 3.6  Chloride 101 - 111 mmol/L 99(L)  CO2 22 - 32 mmol/L 28  Calcium 8.9 - 10.3 mg/dL 9.1  Total Protein 6.5 - 8.1 g/dL 7.2  Total Bilirubin  0.3 - 1.2 mg/dL 0.4  Alkaline Phos 38 - 126 U/L 84  AST 15 - 41 U/L 16  ALT 17 - 63 U/L 12(L)   CBC Latest Ref Rng & Units 10/07/2017  WBC 3.8 - 10.6 K/uL 6.0  Hemoglobin 13.0 - 18.0 g/dL 11.1(L)  Hematocrit 40.0 - 52.0 % 33.7(L)  Platelets 150 - 440 K/uL 275      Assessment and plan- Patient is a 82 y.o. male with high risk castrate sensitive prostate cancer currently on Zytiga  PSA from today is pending but prior PSAs have all been normal.  Also his alkaline phosphatase is normalized after initiating Zytiga indicating good response to bony metastases.  I will plan to get a repeat CT chest abdomen and pelvis without contrast and a bone scan in 2 weeks time to assess his systemic response.  Patient will continue to be on Zytiga and prednisone at this time  Osteoporosis: Patient will continue to take his Fosamax weekly.  He is due for his Lupron in January 2019.  He is currently not on Zometa or Xgeva as he has castrate sensitive disease  Repeat CBC, CMP in 1 month 2 months and 3 months and I will see him back in 3 months with a CBC CMP and PSA  Oxycodone prescription has been refilled for his low back pain   Visit Diagnosis 1. Prostate cancer metastatic to bone ([place])   2. High risk medication use      Dr. Randa Evens, MD, MPH [facility] Pager- [phone] 10/07/2017 3:09 PM

## 2017-10-17 NOTE — Telephone Encounter (Signed)
Error

## 2017-10-21 ENCOUNTER — Ambulatory Visit
Admission: RE | Admit: 2017-10-21 | Discharge: 2017-10-21 | Disposition: A | Discharge status: Home / Self Care | Payer: Medicare Other | Source: Ambulatory Visit | Attending: Oncology | Admitting: Oncology

## 2017-10-21 ENCOUNTER — Encounter
Admission: RE | Admit: 2017-10-21 | Discharge: 2017-10-21 | Disposition: A | Discharge status: Home / Self Care | Payer: Medicare Other | Source: Ambulatory Visit | Attending: Oncology | Admitting: Oncology

## 2017-10-21 DIAGNOSIS — K5750 Diverticulosis of both small and large intestine without perforation or abscess without bleeding: Secondary | ICD-10-CM | POA: Diagnosis not present

## 2017-10-21 DIAGNOSIS — C7951 Secondary malignant neoplasm of bone: Secondary | ICD-10-CM | POA: Diagnosis present

## 2017-10-21 DIAGNOSIS — K449 Diaphragmatic hernia without obstruction or gangrene: Secondary | ICD-10-CM | POA: Diagnosis not present

## 2017-10-21 DIAGNOSIS — I700 Atherosclerosis of aorta: Secondary | ICD-10-CM | POA: Diagnosis not present

## 2017-10-21 DIAGNOSIS — I2510 Atherosclerotic heart disease of native coronary artery without angina pectoris: Secondary | ICD-10-CM | POA: Insufficient documentation

## 2017-10-21 DIAGNOSIS — C61 Malignant neoplasm of prostate: Secondary | ICD-10-CM | POA: Diagnosis not present

## 2017-10-21 MED ORDER — TECHNETIUM TC 99M MEDRONATE IV KIT
25.0000 | PACK | Freq: Once | INTRAVENOUS | Status: AC | PRN
  Administered 2017-10-21: 20.44 via INTRAVENOUS

## 2017-11-03 ENCOUNTER — Inpatient Hospital Stay: Payer: Medicare Other | Attending: Oncology

## 2017-11-03 ENCOUNTER — Inpatient Hospital Stay: Payer: Medicare Other

## 2017-11-03 ENCOUNTER — Other Ambulatory Visit: Payer: Self-pay | Admitting: Oncology

## 2017-11-03 ENCOUNTER — Telehealth: Payer: Self-pay | Admitting: Pharmacist

## 2017-11-03 DIAGNOSIS — C61 Malignant neoplasm of prostate: Secondary | ICD-10-CM

## 2017-11-03 DIAGNOSIS — C7951 Secondary malignant neoplasm of bone: Principal | ICD-10-CM

## 2017-11-03 DIAGNOSIS — N189 Chronic kidney disease, unspecified: Secondary | ICD-10-CM

## 2017-11-03 DIAGNOSIS — Z79818 Long term (current) use of other agents affecting estrogen receptors and estrogen levels: Secondary | ICD-10-CM | POA: Diagnosis not present

## 2017-11-03 DIAGNOSIS — N3289 Other specified disorders of bladder: Secondary | ICD-10-CM

## 2017-11-03 DIAGNOSIS — D631 Anemia in chronic kidney disease: Secondary | ICD-10-CM

## 2017-11-03 LAB — COMPREHENSIVE METABOLIC PANEL
ALT: 13 U/L — AB (ref 17–63)
AST: 17 U/L (ref 15–41)
Albumin: 3.6 g/dL (ref 3.5–5.0)
Alkaline Phosphatase: 97 U/L (ref 38–126)
Anion gap: 9 (ref 5–15)
BUN: 39 mg/dL — ABNORMAL HIGH (ref 6–20)
CHLORIDE: 98 mmol/L — AB (ref 101–111)
CO2: 30 mmol/L (ref 22–32)
CREATININE: 1.89 mg/dL — AB (ref 0.61–1.24)
Calcium: 9.1 mg/dL (ref 8.9–10.3)
GFR calc Af Amer: 37 mL/min — ABNORMAL LOW (ref >60)
GFR, EST NON AFRICAN AMERICAN: 32 mL/min — AB (ref >60)
Glucose, Bld: 123 mg/dL — ABNORMAL HIGH (ref 65–99)
POTASSIUM: 4.2 mmol/L (ref 3.5–5.1)
SODIUM: 137 mmol/L (ref 135–145)
Total Bilirubin: 0.3 mg/dL (ref 0.3–1.2)
Total Protein: 7.1 g/dL (ref 6.5–8.1)

## 2017-11-03 LAB — CBC WITH DIFFERENTIAL/PLATELET
BASOS PCT: 1 %
Basophils Absolute: 0.1 10*3/uL (ref 0–0.1)
EOS PCT: 1 %
Eosinophils Absolute: 0.1 10*3/uL (ref 0–0.7)
HEMATOCRIT: 33.3 % — AB (ref 40.0–52.0)
HEMOGLOBIN: 10.9 g/dL — AB (ref 13.0–18.0)
Lymphocytes Relative: 13 %
Lymphs Abs: 0.9 10*3/uL — ABNORMAL LOW (ref 1.0–3.6)
MCH: 29.4 pg (ref 26.0–34.0)
MCHC: 32.7 g/dL (ref 32.0–36.0)
MCV: 89.8 fL (ref 80.0–100.0)
MONO ABS: 0.6 10*3/uL (ref 0.2–1.0)
MONOS PCT: 8 %
NEUTROS PCT: 77 %
Neutro Abs: 5.5 10*3/uL (ref 1.4–6.5)
Platelets: 372 10*3/uL (ref 150–440)
RBC: 3.7 MIL/uL — ABNORMAL LOW (ref 4.40–5.90)
RDW: 14.3 % (ref 11.5–14.5)
WBC: 7.2 10*3/uL (ref 3.8–10.6)

## 2017-11-03 MED ORDER — PREDNISONE 5 MG PO TABS: 5.0000 mg | ORAL_TABLET | Freq: Two times a day (BID) | ORAL | 5 refills | Status: DC

## 2017-11-03 MED ORDER — ABIRATERONE ACETATE 250 MG PO TABS: 1000.0000 mg | ORAL_TABLET | Freq: Every day | ORAL | 2 refills | Status: DC

## 2017-11-03 MED ORDER — LEUPROLIDE ACETATE (3 MONTH) 22.5 MG IM KIT
22.5000 mg | PACK | Freq: Once | INTRAMUSCULAR | Status: AC
  Administered 2017-11-03: 22.5 mg via INTRAMUSCULAR

## 2017-11-03 MED ORDER — OXYCODONE HCL 10 MG PO TABS: 10.0000 mg | ORAL_TABLET | ORAL | 0 refills | Status: DC | PRN

## 2017-11-03 NOTE — Telephone Encounter (Addendum)
Oral Oncology Patient Advocate Encounter  Was successful in securing patient an $ 7500.00 grant from [employer] [facility]) to provide copayment coverage for his Zytiga. This will keep the out of pocket expense at $0.    I have spoken with the patient.    The billing information is as follows and has been shared with [place].   Member ID: [id number] Group ID: [id number] RxBin: [id number] Dates of Eligibility: 08/04/2017 through 0101/2020   Co-pay is $7867.54  [place] Patient Advocate [phone] 11/03/2017 3:10 PM

## 2017-11-03 NOTE — Telephone Encounter (Signed)
Oral Oncology Patient Advocate Encounter  Was successful in securing patient an $ 4250.00 grant from [employer] to provide copayment coverage for his Zytiga.  This will keep the out of pocket expense at $0.    I have spoken with the patient.    The billing information is as follows and has been shared with [place].   Member ID: [id number] Group ID: [id number] RxBin: [id number] Dates of Eligibility:  [facility] Patient Advocate [phone] 11/03/2017 3:41 PM

## 2017-11-03 NOTE — Telephone Encounter (Signed)
Oral Chemotherapy Pharmacist Encounter  Foundation assistance opened up to cover the copay of Mr. Johnathan Gould. A prescription for his Zytiga and prednisione will now be sent to [place].   Darl Pikes, PharmD, BCPS Hematology/Oncology Clinical Pharmacist [facility] [phone]  11/03/2017 10:05 AM

## 2017-11-07 ENCOUNTER — Telehealth: Payer: Self-pay | Admitting: Oncology

## 2017-11-07 NOTE — Telephone Encounter (Signed)
Oral Oncology Patient Advocate Encounter  E-mailed [facility] to please mail Zytiga and Prednisone.   [place] Patient Advocate [phone] 11/07/2017 9:27 AM

## 2017-11-11 ENCOUNTER — Telehealth: Payer: Self-pay | Admitting: Oncology

## 2017-11-11 NOTE — Telephone Encounter (Signed)
Oral Oncology Patient Advocate Encounter  Was successful in securing patient a one time fill with [employer] until they receive other information from the patient. They are sending paper work to the patient.     ID: [id number] Gr: [id number] Bin: [id number] PCN: [id number]    11/11/2017-12/11/2017    [facility] Patient Advocate [phone] 11/11/2017 3:31 PM

## 2017-11-24 ENCOUNTER — Telehealth: Payer: Self-pay | Admitting: Oncology

## 2017-11-24 NOTE — Telephone Encounter (Signed)
Oral Oncology Patient Advocate Encounter  Faxed application to [employer] in order to get more funding. Was approved for only 1 fill. Will follow up to see if more funding is granted. [phone]  Fax # [place] [facility] Patient Advocate [phone] 11/24/2017 10:29 AM

## 2017-11-28 MED FILL — predniSONE 5 MG TABS: 5 | 30 days supply | Qty: 60 | Fill #0

## 2017-11-28 MED FILL — ZYTIGA 250 MG TABLET: 250 | 30 days supply | Qty: 120 | Fill #0

## 2017-11-29 ENCOUNTER — Other Ambulatory Visit: Payer: Self-pay | Admitting: Oncology

## 2017-11-29 ENCOUNTER — Telehealth: Payer: Self-pay | Admitting: *Deleted

## 2017-11-29 DIAGNOSIS — N3289 Other specified disorders of bladder: Secondary | ICD-10-CM

## 2017-11-29 DIAGNOSIS — C61 Malignant neoplasm of prostate: Secondary | ICD-10-CM

## 2017-11-29 MED ORDER — OXYCODONE HCL 10 MG PO TABS: 10.0000 mg | ORAL_TABLET | ORAL | 0 refills | Status: DC | PRN

## 2017-11-29 NOTE — Telephone Encounter (Signed)
Done

## 2017-11-29 NOTE — Telephone Encounter (Signed)
I spoke to you and you are going to send it today. It is early from his usual but based on how many times he can take it he has the ability to run out by now. You were going to send it today

## 2017-11-29 NOTE — Telephone Encounter (Signed)
Pt would like a refill of oxycodone today. I told him he may not be able to get it since it is 4 days before next refill date.  I will ask Dr. [name] to refill.

## 2017-11-29 NOTE — Telephone Encounter (Signed)
Can you remind me on Thursday to do it?

## 2017-11-29 NOTE — Telephone Encounter (Signed)
Oral Oncology Patient Advocate Encounter  Patient was approved by [employer] until 10/31/2018.   [place] Patient Advocate [phone] 11/29/2017 9:18 AM

## 2017-12-02 ENCOUNTER — Ambulatory Visit
Admission: RE | Admit: 2017-12-02 | Discharge: 2017-12-02 | Disposition: A | Discharge status: Home / Self Care | Payer: Medicare Other | Source: Ambulatory Visit | Attending: Radiation Oncology | Admitting: Radiation Oncology

## 2017-12-02 ENCOUNTER — Other Ambulatory Visit: Payer: Self-pay

## 2017-12-02 ENCOUNTER — Encounter: Payer: Self-pay | Admitting: Radiation Oncology

## 2017-12-02 VITALS — BP 139/82 | HR 63 | Temp 96.7°F | Resp 20 | Wt 191.7 lb

## 2017-12-02 DIAGNOSIS — C61 Malignant neoplasm of prostate: Secondary | ICD-10-CM | POA: Insufficient documentation

## 2017-12-02 DIAGNOSIS — Z923 Personal history of irradiation: Secondary | ICD-10-CM | POA: Diagnosis not present

## 2017-12-02 DIAGNOSIS — C7951 Secondary malignant neoplasm of bone: Secondary | ICD-10-CM | POA: Diagnosis not present

## 2017-12-02 NOTE — Progress Notes (Signed)
Radiation Oncology Follow up Note  Name: Johnathan Gould   Date:   12/02/2017 MRN:  [id number] DOB: [birth year]    This 83 y.o. male presents to the clinic today for one-year follow-up status post palliative radiation therapy to his lumbar spine and SI joints and left hip for stage IV prostate cancer.  REFERRING PROVIDER: [name], MD  HPI: Patient is an 83 year old male now about 1 year having completed palliative radiation therapy to multiple sites for stage IV adenocarcinoma the prostate.Marland Kitchen  He is currently on Lupron and Zytiga with recent bone scan showing stable osseous metastatic disease.  His PSA remains almost undetectable showing good response to therapy.  Recent CT scan of chest and abdomen shows continued reduction in retroperitoneal adenopathy.  COMPLICATIONS OF TREATMENT: none  FOLLOW UP COMPLIANCE: keeps appointments   PHYSICAL EXAM:  BP 139/82   Pulse 63   Temp (!) 96.7 F (35.9 C)   Resp 20   Wt 191 lb 11 oz (87 kg)   BMI 29.15 kg/m  Well-developed well-nourished patient in NAD. HEENT reveals PERLA, EOMI, discs not visualized.  Oral cavity is clear. No oral mucosal lesions are identified. Neck is clear without evidence of cervical or supraclavicular adenopathy. Lungs are clear to A&P. Cardiac examination is essentially unremarkable with regular rate and rhythm without murmur rub or thrill. Abdomen is benign with no organomegaly or masses noted. Motor sensory and DTR levels are equal and symmetric in the upper and lower extremities. Cranial nerves II through XII are grossly intact. Proprioception is intact. No peripheral adenopathy or edema is identified. No motor or sensory levels are noted. Crude visual fields are within normal range.  RADIOLOGY RESULTS: CT scans and bone scans were both reviewed and compatible with the above-stated findings  PLAN: Present time patient is responding well to current therapy with Lupron and Zytiga.  He is not castrate resistant at this  point.  We have discussed Xofigo treatment which is only recommended for castrate resistant disease.  I have asked to see him back in 6 months for follow-up.  Should any areas become extremely painful would be happy to reevaluate him at any time for palliative treatment.  I would like to take this opportunity to thank you for allowing me to participate in the care of your patient.Noreene Filbert, MD

## 2017-12-05 ENCOUNTER — Telehealth: Payer: Self-pay | Admitting: Oncology

## 2017-12-05 NOTE — Telephone Encounter (Signed)
Oral Oncology Patient Advocate Encounter  Received notification from [facility] Patient Assistance program that patient has been successfully enrolled into their program to receive Zitiga from the manufacturer at $0 out of pocket until 10/31/2018.   I called and spoke with patient.  He knows we will have to re-apply.   Patient knows to call the office with questions or concerns.  Oral Oncology Clinic will continue to follow.  Spoke with [employer] and asked them to put inactive until we use up his grants, and then we will call to activate. They said no problem.  [place] Patient Advocate [phone] 12/05/2017 12:23 PM

## 2017-12-08 ENCOUNTER — Inpatient Hospital Stay: Payer: Medicare Other | Attending: Oncology

## 2017-12-08 DIAGNOSIS — C7951 Secondary malignant neoplasm of bone: Secondary | ICD-10-CM | POA: Diagnosis present

## 2017-12-08 DIAGNOSIS — C61 Malignant neoplasm of prostate: Secondary | ICD-10-CM | POA: Diagnosis not present

## 2017-12-08 DIAGNOSIS — N3289 Other specified disorders of bladder: Secondary | ICD-10-CM

## 2017-12-08 LAB — CBC WITH DIFFERENTIAL/PLATELET
BASOS ABS: 0.1 10*3/uL (ref 0–0.1)
Basophils Relative: 1 %
EOS PCT: 0 %
Eosinophils Absolute: 0 10*3/uL (ref 0–0.7)
HCT: 34.9 % — ABNORMAL LOW (ref 40.0–52.0)
Hemoglobin: 11.3 g/dL — ABNORMAL LOW (ref 13.0–18.0)
Lymphocytes Relative: 8 %
Lymphs Abs: 0.7 10*3/uL — ABNORMAL LOW (ref 1.0–3.6)
MCH: 29.1 pg (ref 26.0–34.0)
MCHC: 32.3 g/dL (ref 32.0–36.0)
MCV: 90 fL (ref 80.0–100.0)
MONO ABS: 0.6 10*3/uL (ref 0.2–1.0)
Monocytes Relative: 7 %
Neutro Abs: 7.5 10*3/uL — ABNORMAL HIGH (ref 1.4–6.5)
Neutrophils Relative %: 84 %
PLATELETS: 315 10*3/uL (ref 150–440)
RBC: 3.88 MIL/uL — ABNORMAL LOW (ref 4.40–5.90)
RDW: 15.1 % — AB (ref 11.5–14.5)
WBC: 8.9 10*3/uL (ref 3.8–10.6)

## 2017-12-08 LAB — COMPREHENSIVE METABOLIC PANEL
ALBUMIN: 3.6 g/dL (ref 3.5–5.0)
ALK PHOS: 72 U/L (ref 38–126)
ALT: 16 U/L — ABNORMAL LOW (ref 17–63)
ANION GAP: 9 (ref 5–15)
AST: 17 U/L (ref 15–41)
BILIRUBIN TOTAL: 0.4 mg/dL (ref 0.3–1.2)
BUN: 45 mg/dL — ABNORMAL HIGH (ref 6–20)
CO2: 31 mmol/L (ref 22–32)
Calcium: 9.1 mg/dL (ref 8.9–10.3)
Chloride: 100 mmol/L — ABNORMAL LOW (ref 101–111)
Creatinine, Ser: 2.03 mg/dL — ABNORMAL HIGH (ref 0.61–1.24)
GFR calc Af Amer: 34 mL/min — ABNORMAL LOW (ref >60)
GFR, EST NON AFRICAN AMERICAN: 29 mL/min — AB (ref >60)
GLUCOSE: 109 mg/dL — AB (ref 65–99)
Potassium: 4.6 mmol/L (ref 3.5–5.1)
Sodium: 140 mmol/L (ref 135–145)
TOTAL PROTEIN: 6.9 g/dL (ref 6.5–8.1)

## 2017-12-09 ENCOUNTER — Telehealth: Payer: Self-pay | Admitting: Urology

## 2017-12-09 DIAGNOSIS — C61 Malignant neoplasm of prostate: Secondary | ICD-10-CM

## 2017-12-09 NOTE — Telephone Encounter (Signed)
Thank you :)

## 2017-12-09 NOTE — Telephone Encounter (Signed)
Pt notified of appt for RUS & office visit with Dr Erlene Quan. Pt voices understanding.

## 2017-12-09 NOTE — Telephone Encounter (Signed)
Received message from Dr. [name] that the patient's creatinine is rising.  He has a previous history of bilateral ureteral obstruction from his bulky metastatic disease.  Most recent scan shows no evidence of hydronephrosis but this was in 10/2017.  I like him to come in in the next 1-2 weeks with a renal ultrasound prior to evaluate whether or not he needs any further intervention.  Please help arrange and let him know the plan.  Hollice Espy, MD

## 2017-12-13 ENCOUNTER — Ambulatory Visit
Admission: RE | Admit: 2017-12-13 | Discharge: 2017-12-13 | Disposition: A | Discharge status: Home / Self Care | Payer: Medicare Other | Source: Ambulatory Visit | Attending: Urology | Admitting: Urology

## 2017-12-13 DIAGNOSIS — N281 Cyst of kidney, acquired: Secondary | ICD-10-CM | POA: Diagnosis not present

## 2017-12-13 DIAGNOSIS — C61 Malignant neoplasm of prostate: Secondary | ICD-10-CM | POA: Insufficient documentation

## 2017-12-15 ENCOUNTER — Ambulatory Visit: Payer: Medicare Other | Admitting: Urology

## 2017-12-15 ENCOUNTER — Encounter: Payer: Self-pay | Admitting: Urology

## 2017-12-15 VITALS — BP 133/65 | HR 82 | Ht 68.0 in | Wt 180.0 lb

## 2017-12-15 DIAGNOSIS — C61 Malignant neoplasm of prostate: Secondary | ICD-10-CM | POA: Diagnosis not present

## 2017-12-15 LAB — BLADDER SCAN AMB NON-IMAGING

## 2017-12-15 NOTE — Progress Notes (Signed)
Due to being tied up in the operating room earlier this morning during Mr. Kainz scheduled appointment time, I was unable to see the patient in clinic as scheduled.  He did come in for post void residual which was minimal.  Called patient later in the day to discuss his renal ultrasound report.  He has no evidence of hydronephrosis bilaterally and thus no evidence of recurrent ureteral obstruction.  At this point time, I see no indication for percutaneous or ureteral stenting as this does not appear to be an obstructive process.  Question underlying medical renal disease.  I encouraged the patient to increase his fluids particularly in the daytime and early afternoon to avoid significant nocturia.  He understands this.  Case was also discussed with Dr. [name] today.  She will repeat his labs at her follow-up within the month and if his creatinine continues to rise, will refer to nephrology.   Results for orders placed or performed in visit on 12/15/17  BLADDER SCAN AMB NON-IMAGING  Result Value Ref Range   Scan Result 38ml      Hollice Espy, MD

## 2017-12-27 ENCOUNTER — Other Ambulatory Visit: Payer: Self-pay | Admitting: Oncology

## 2017-12-27 ENCOUNTER — Telehealth: Payer: Self-pay | Admitting: *Deleted

## 2017-12-27 DIAGNOSIS — C61 Malignant neoplasm of prostate: Secondary | ICD-10-CM

## 2017-12-27 DIAGNOSIS — N3289 Other specified disorders of bladder: Secondary | ICD-10-CM

## 2017-12-27 MED ORDER — OXYCODONE HCL 10 MG PO TABS: 10.0000 mg | ORAL_TABLET | ORAL | 0 refills | Status: DC | PRN

## 2017-12-27 MED ORDER — ONDANSETRON HCL 4 MG PO TABS: 4.0000 mg | ORAL_TABLET | Freq: Four times a day (QID) | ORAL | 1 refills | Status: DC | PRN

## 2017-12-27 NOTE — Telephone Encounter (Signed)
Needs refill of zofran and I sent it, and Dr. [name] will need to send refill of oxycodone

## 2017-12-28 MED FILL — predniSONE 5 MG TABS: 5 | 30 days supply | Qty: 60 | Fill #1

## 2017-12-28 MED FILL — ZYTIGA 250 MG TABLET: 250 | 30 days supply | Qty: 120 | Fill #1

## 2018-01-05 ENCOUNTER — Encounter: Payer: Self-pay | Admitting: Oncology

## 2018-01-05 ENCOUNTER — Inpatient Hospital Stay: Payer: Medicare Other | Attending: Oncology

## 2018-01-05 ENCOUNTER — Other Ambulatory Visit: Payer: Self-pay | Admitting: *Deleted

## 2018-01-05 ENCOUNTER — Inpatient Hospital Stay (HOSPITAL_BASED_OUTPATIENT_CLINIC_OR_DEPARTMENT_OTHER): Payer: Medicare Other | Admitting: Oncology

## 2018-01-05 VITALS — BP 145/94 | HR 69 | Temp 99.1°F | Resp 20 | Ht 68.0 in | Wt 194.9 lb

## 2018-01-05 DIAGNOSIS — K5903 Drug induced constipation: Secondary | ICD-10-CM | POA: Insufficient documentation

## 2018-01-05 DIAGNOSIS — G893 Neoplasm related pain (acute) (chronic): Secondary | ICD-10-CM | POA: Insufficient documentation

## 2018-01-05 DIAGNOSIS — C7951 Secondary malignant neoplasm of bone: Secondary | ICD-10-CM | POA: Insufficient documentation

## 2018-01-05 DIAGNOSIS — J329 Chronic sinusitis, unspecified: Secondary | ICD-10-CM | POA: Diagnosis not present

## 2018-01-05 DIAGNOSIS — N189 Chronic kidney disease, unspecified: Secondary | ICD-10-CM | POA: Diagnosis not present

## 2018-01-05 DIAGNOSIS — C61 Malignant neoplasm of prostate: Secondary | ICD-10-CM | POA: Diagnosis present

## 2018-01-05 DIAGNOSIS — N179 Acute kidney failure, unspecified: Secondary | ICD-10-CM | POA: Diagnosis not present

## 2018-01-05 DIAGNOSIS — N3289 Other specified disorders of bladder: Secondary | ICD-10-CM

## 2018-01-05 DIAGNOSIS — Z79899 Other long term (current) drug therapy: Secondary | ICD-10-CM

## 2018-01-05 LAB — CBC WITH DIFFERENTIAL/PLATELET
BASOS ABS: 0.1 10*3/uL (ref 0–0.1)
BASOS PCT: 1 %
Eosinophils Absolute: 0 10*3/uL (ref 0–0.7)
Eosinophils Relative: 0 %
HCT: 35.6 % — ABNORMAL LOW (ref 40.0–52.0)
HEMOGLOBIN: 11.8 g/dL — AB (ref 13.0–18.0)
Lymphocytes Relative: 7 %
Lymphs Abs: 0.6 10*3/uL — ABNORMAL LOW (ref 1.0–3.6)
MCH: 29.7 pg (ref 26.0–34.0)
MCHC: 33.3 g/dL (ref 32.0–36.0)
MCV: 89.3 fL (ref 80.0–100.0)
Monocytes Absolute: 0.5 10*3/uL (ref 0.2–1.0)
Monocytes Relative: 6 %
NEUTROS PCT: 86 %
Neutro Abs: 7.7 10*3/uL — ABNORMAL HIGH (ref 1.4–6.5)
Platelets: 312 10*3/uL (ref 150–440)
RBC: 3.98 MIL/uL — AB (ref 4.40–5.90)
RDW: 15.1 % — ABNORMAL HIGH (ref 11.5–14.5)
WBC: 8.9 10*3/uL (ref 3.8–10.6)

## 2018-01-05 LAB — COMPREHENSIVE METABOLIC PANEL
ALBUMIN: 3.3 g/dL — AB (ref 3.5–5.0)
ALK PHOS: 82 U/L (ref 38–126)
ALT: 15 U/L — AB (ref 17–63)
ANION GAP: 12 (ref 5–15)
AST: 16 U/L (ref 15–41)
BUN: 46 mg/dL — ABNORMAL HIGH (ref 6–20)
CALCIUM: 9.2 mg/dL (ref 8.9–10.3)
CO2: 28 mmol/L (ref 22–32)
Chloride: 98 mmol/L — ABNORMAL LOW (ref 101–111)
Creatinine, Ser: 1.65 mg/dL — ABNORMAL HIGH (ref 0.61–1.24)
GFR calc Af Amer: 44 mL/min — ABNORMAL LOW (ref >60)
GFR calc non Af Amer: 38 mL/min — ABNORMAL LOW (ref >60)
GLUCOSE: 148 mg/dL — AB (ref 65–99)
Potassium: 4.2 mmol/L (ref 3.5–5.1)
SODIUM: 138 mmol/L (ref 135–145)
Total Bilirubin: 0.4 mg/dL (ref 0.3–1.2)
Total Protein: 7.1 g/dL (ref 6.5–8.1)

## 2018-01-05 LAB — PSA: Prostatic Specific Antigen: 0.02 ng/mL (ref 0.00–4.00)

## 2018-01-05 MED ORDER — OXYCODONE HCL 5 MG PO TABS: 5.0000 mg | ORAL_TABLET | ORAL | 0 refills | Status: DC | PRN

## 2018-01-05 NOTE — Progress Notes (Signed)
Pt is miserable today. He has been having swelling of sinus and left cheek. He was given levaquin and tussionex. He has bad hip pain and bilateral shoulder pain. He has had 3 episodes of his upper abd. Hurting and feeling like he is going to have everything come up and he has to go lay down and it gets better. He is concerned about kidney levels

## 2018-01-05 NOTE — Progress Notes (Signed)
Hematology/Oncology Consult note [facility]  [phone] [phone]  Patient Care Team: [name], MD as PCP - General (Internal Medicine)   Name of the patient: Johnathan Gould  [id number]  11/20/1934   Date of visit: 01/05/18  Diagnosis- 1. 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 Anemia of chronic kidney disease  Chief complaint/ Reason for visit- routine f/u of prostate cancer  Heme/Onc history:patient is a 83 year old male was recently admitted to the hospital on 10/16/2016 with symptoms of left hip pain and renal failure. He was found to have bilateral hydronephrosis and is status post bilateral nephrostomy tube placement. CT abdomen showed an irregular polypoid mass at the posterior aspect of the bladder highly concerning for bladder carcinoma. Extensive bulky adenopathy throughout the abdomen and pelvis consistent with nodal metastases. Widespread osseous metastatic disease. Bone scan showed multifocal areas of increased activity throughout the pelvis, bilateral femurs, rib cage, right scapula, thoracic cervical and lumbar spine consistent with metastatic disease. Patient was found to have enlarged abnormal prostate as well as an elevated PSA of 469 and was presumptively diagnosed with metastatic prostate cancer. He was started on Firmagon by urology. He was also seen by radiation oncology and started palliative radiation to his left hip on 11/04/2016 and finished 10 doses of palliative Rt.  He is currently on lupron through [place] every 3 months  4 core biopsy obtained for tissue diagnosis which showed: prostate adenocarcinoma gleasons 9 and 10 in all 4 cores  zytiga started in March 2018  Interval history- still has low back pain and pain in his b/l hips and across the rib cage and right knee. He has been using oxycodone 10 mg TID but says it does not help much. Also reports constipation not relieved by senna. He had  symptoms of left facial pain and swelling and is currently on levaquin per Dr. Lavera Guise for his sinusitis  ECOG PS- 1 Pain scale- 6 Opioid associated constipation- yes  Review of systems- Review of Systems  Constitutional: Negative for chills, fever, malaise/fatigue and weight loss.  HENT: Negative for congestion, ear discharge and nosebleeds.   Eyes: Negative for blurred vision.  Respiratory: Negative for cough, hemoptysis, sputum production, shortness of breath and wheezing.   Cardiovascular: Negative for chest pain, palpitations, orthopnea and claudication.  Gastrointestinal: Negative for abdominal pain, blood in stool, constipation, diarrhea, heartburn, melena, nausea and vomiting.  Genitourinary: Negative for dysuria, flank pain, frequency, hematuria and urgency.  Musculoskeletal: Positive for back pain and joint pain. Negative for myalgias.  Skin: Negative for rash.  Neurological: Negative for dizziness, tingling, focal weakness, seizures, weakness and headaches.  Endo/Heme/Allergies: Does not bruise/bleed easily.  Psychiatric/Behavioral: Negative for depression and suicidal ideas. The patient does not have insomnia.       No Known Allergies   Past Medical History:  Diagnosis Date  . CAD (coronary artery disease)   . Cancer ([place])   . Hypertension   . RAD (reactive airway disease)      Past Surgical History:  Procedure Laterality Date  . CORONARY ANGIOPLASTY WITH STENT PLACEMENT    . IR GENERIC HISTORICAL  10/17/2016   IR NEPHROSTOMY PLACEMENT RIGHT 10/17/2016 [facility]INTER[facility] RAD  . IR GENERIC HISTORICAL  10/17/2016   IR NEPHROSTOMY PLACEMENT LEFT 10/17/2016 [name], MD [facility]  . IR GENERIC HISTORICAL  12/23/2016   IR NEPHROSTOMY EXCHANGE LEFT 12/23/2016 [facility]  . IR GENERIC HISTORICAL  12/23/2016   IR NEPHROSTOMY EXCHANGE RIGHT 12/23/2016 [facility]  . IR NEPHROSTOMY EXCHANGE LEFT  02/11/2017  . IR NEPHROSTOMY EXCHANGE RIGHT  02/11/2017  . KNEE  SURGERY Left 2006   Laparascopy done on left knee, scar tissue taken out  . nephrostomy tubes Bilateral     Social History   Socioeconomic History  . Marital status: Married    Spouse name: Not on file  . Number of children: Not on file  . Years of education: Not on file  . Highest education level: Not on file  Social Needs  . Financial resource strain: Not on file  . Food insecurity - worry: Not on file  . Food insecurity - inability: Not on file  . Transportation needs - medical: Not on file  . Transportation needs - non-medical: Not on file  Occupational History  . Not on file  Tobacco Use  . Smoking status: Former Smoker    Packs/day: 1.00    Years: 69.50    Pack years: 69.50    Last attempt to quit: 11/04/2014    Years since quitting: 3.1  . Smokeless tobacco: Never Used  Substance and Sexual Activity  . Alcohol use: No  . Drug use: No  . Sexual activity: Yes  Other Topics Concern  . Not on file  Social History Narrative  . Not on file    Family History  Problem Relation Age of Onset  . Hypertension Other   . Lung cancer Brother      Current Outpatient Medications:  .  abiraterone acetate (ZYTIGA) 250 MG tablet, Take 4 tablets (1,000 mg total) by mouth daily. Take on an empty stomach 1 hour before or 2 hours after a meal, Disp: 120 tablet, Rfl: 2 .  alendronate (FOSAMAX) 70 MG tablet, Take 1 tablet (70 mg total) by mouth once a week. Take with a full glass of water on an empty stomach., Disp: 4 tablet, Rfl: 6 .  aspirin EC 81 MG tablet, Take by mouth., Disp: , Rfl:  .  clotrimazole (MYCELEX) 10 MG troche, Take 1 lozenge (10 mg total) by mouth 4 (four) times daily. Until white patches resolved in mouth, Disp: 70 lozenge, Rfl: 1 .  hydrochlorothiazide (HYDRODIURIL) 12.5 MG tablet, , Disp: , Rfl:  .  metoprolol (LOPRESSOR) 50 MG tablet, Take 50 mg by mouth 2 (two) times daily. , Disp: , Rfl:  .  nitroGLYCERIN (NITROSTAT) 0.4 MG SL tablet, Place 0.4 mg under the  tongue every 5 (five) minutes x 3 doses as needed for chest pain., Disp: , Rfl:  .  ondansetron (ZOFRAN) 4 MG tablet, Take 1 tablet (4 mg total) by mouth every 6 (six) hours as needed. for nausea, Disp: 40 tablet, Rfl: 1 .  Oxycodone HCl 10 MG TABS, Take 1 tablet (10 mg total) by mouth every 4 (four) hours as needed., Disp: 60 tablet, Rfl: 0 .  predniSONE (DELTASONE) 5 MG tablet, Take 1 tablet (5 mg total) by mouth 2 (two) times daily with a meal., Disp: 60 tablet, Rfl: 5 .  sennosides-docusate sodium (SENOKOT-S) 8.6-50 MG tablet, Take 2 tablets by mouth daily., Disp: , Rfl:  .  tamsulosin (FLOMAX) 0.4 MG CAPS capsule, Take 1 capsule (0.4 mg total) by mouth daily., Disp: 30 capsule, Rfl: 11  Physical exam:  Vitals:   01/05/18 1124  BP: (!) 145/94  Pulse: 69  Resp: 20  Temp: 99.1 F (37.3 C)  TempSrc: Tympanic  Weight: 194 lb 14.4 oz (88.4 kg)  Height: 5\' 8"  (1.727 m)   Physical  Exam  Constitutional: He is oriented to person, place, and time and well-developed, well-nourished, and in no distress.  HENT:  Head: Normocephalic and atraumatic.  Geographic tongue  Eyes: EOM are normal. Pupils are equal, round, and reactive to light.  Neck: Normal range of motion.  Cardiovascular: Normal rate, regular rhythm and normal heart sounds.  Pulmonary/Chest: Effort normal and breath sounds normal.  Abdominal: Soft. Bowel sounds are normal.  Neurological: He is alert and oriented to person, place, and time.  Skin: Skin is warm and dry.     CMP Latest Ref Rng & Units 01/05/2018  Glucose 65 - 99 mg/dL 148(H)  BUN 6 - 20 mg/dL 46(H)  Creatinine 0.61 - 1.24 mg/dL 1.65(H)  Sodium 135 - 145 mmol/L 138  Potassium 3.5 - 5.1 mmol/L 4.2  Chloride 101 - 111 mmol/L 98(L)  CO2 22 - 32 mmol/L 28  Calcium 8.9 - 10.3 mg/dL 9.2  Total Protein 6.5 - 8.1 g/dL 7.1  Total Bilirubin 0.3 - 1.2 mg/dL 0.4  Alkaline Phos 38 - 126 U/L 82  AST 15 - 41 U/L 16  ALT 17 - 63 U/L 15(L)   CBC Latest Ref Rng & Units  01/05/2018  WBC 3.8 - 10.6 K/uL 8.9  Hemoglobin 13.0 - 18.0 g/dL 11.8(L)  Hematocrit 40.0 - 52.0 % 35.6(L)  Platelets 150 - 440 K/uL 312    No images are attached to the encounter.  US Renal  Result Date: 12/13/2017 CLINICAL DATA:  Prostate malignancy. EXAM: RENAL / URINARY TRACT ULTRASOUND COMPLETE COMPARISON:  Abdominal and pelvic CT scan of October 21, 2017 FINDINGS: Right Kidney: Length: 9.9 cm. Echogenicity within normal limits. No mass or hydronephrosis visualized. Left Kidney: Length: 9.3 cm. Echogenicity within normal limits. No hydronephrosis visualized. There is an upper pole cyst measuring 1.3 x 1.1 x 1.2 cm. Bladder: Appears normal for degree of bladder distention. IMPRESSION: Normal appearance of both kidneys. Simple appearing upper pole cyst on the left. Normal appearing urinary bladder. Electronically Signed   By: David  Martinique M.D.   On: 12/13/2017 15:17     Assessment and plan- Patient is a 83 y.o. male with high risk castrate sensitive prostate cancer currently on Zytiga  CSPC- Recent CT scan and bone scan which I reviewed independently from Dec 2018 showed continued reduction in retroperitoneal adenopathy and stable bone mets. PSA is now at 0.01 over last 6 months. Continue zytiga at this time  AKI on CKD- creatinine improved to 1.6 today from 2.03. Recent USG showed no obstruction. Therefore no intervention per urology. Encouraged PO hydration. Continue to monitor and repeat labs in 1 month  Low back pain- suspect combination of metastatic prostate cancer with bone mets and age related arthritis. Increase oxycodone to 15 mg PO Q4 prn which he understands to use sparingly.  Opioid associated constipation- he will start taking miralax with senna  rtc in 1 month. Cbc/ cmp/psa and gets lupron    Visit Diagnosis 1. Prostate cancer ([place])   2. Prostate cancer metastatic to bone ([place])   3. High risk medication use   4. Neoplasm related pain      Dr. Randa Evens, MD,  [facility] Pager- [phone] 01/05/2018 8:23 AM

## 2018-01-09 ENCOUNTER — Emergency Department
Admission: EM | Admit: 2018-01-09 | Discharge: 2018-01-09 | Disposition: A | Discharge status: Home / Self Care | Payer: Medicare Other | Attending: Emergency Medicine | Admitting: Emergency Medicine

## 2018-01-09 ENCOUNTER — Encounter: Payer: Self-pay | Admitting: Emergency Medicine

## 2018-01-09 ENCOUNTER — Emergency Department: Payer: Medicare Other

## 2018-01-09 DIAGNOSIS — K5900 Constipation, unspecified: Secondary | ICD-10-CM | POA: Diagnosis not present

## 2018-01-09 DIAGNOSIS — Z7982 Long term (current) use of aspirin: Secondary | ICD-10-CM | POA: Diagnosis not present

## 2018-01-09 DIAGNOSIS — K5732 Diverticulitis of large intestine without perforation or abscess without bleeding: Secondary | ICD-10-CM | POA: Diagnosis not present

## 2018-01-09 DIAGNOSIS — Z79899 Other long term (current) drug therapy: Secondary | ICD-10-CM | POA: Insufficient documentation

## 2018-01-09 DIAGNOSIS — I2510 Atherosclerotic heart disease of native coronary artery without angina pectoris: Secondary | ICD-10-CM | POA: Insufficient documentation

## 2018-01-09 DIAGNOSIS — I10 Essential (primary) hypertension: Secondary | ICD-10-CM | POA: Insufficient documentation

## 2018-01-09 DIAGNOSIS — Z8546 Personal history of malignant neoplasm of prostate: Secondary | ICD-10-CM | POA: Insufficient documentation

## 2018-01-09 DIAGNOSIS — R1084 Generalized abdominal pain: Secondary | ICD-10-CM | POA: Diagnosis present

## 2018-01-09 LAB — COMPREHENSIVE METABOLIC PANEL
ALK PHOS: 72 U/L (ref 38–126)
ALT: 16 U/L — ABNORMAL LOW (ref 17–63)
ANION GAP: 11 (ref 5–15)
AST: 22 U/L (ref 15–41)
Albumin: 3.3 g/dL — ABNORMAL LOW (ref 3.5–5.0)
BUN: 59 mg/dL — ABNORMAL HIGH (ref 6–20)
CALCIUM: 8.6 mg/dL — AB (ref 8.9–10.3)
CO2: 29 mmol/L (ref 22–32)
Chloride: 97 mmol/L — ABNORMAL LOW (ref 101–111)
Creatinine, Ser: 1.94 mg/dL — ABNORMAL HIGH (ref 0.61–1.24)
GFR calc non Af Amer: 31 mL/min — ABNORMAL LOW (ref >60)
GFR, EST AFRICAN AMERICAN: 36 mL/min — AB (ref >60)
Glucose, Bld: 143 mg/dL — ABNORMAL HIGH (ref 65–99)
Potassium: 3 mmol/L — ABNORMAL LOW (ref 3.5–5.1)
Sodium: 137 mmol/L (ref 135–145)
Total Bilirubin: 0.4 mg/dL (ref 0.3–1.2)
Total Protein: 6.8 g/dL (ref 6.5–8.1)

## 2018-01-09 LAB — CBC
HCT: 34.1 % — ABNORMAL LOW (ref 40.0–52.0)
HEMOGLOBIN: 11.1 g/dL — AB (ref 13.0–18.0)
MCH: 28.9 pg (ref 26.0–34.0)
MCHC: 32.6 g/dL (ref 32.0–36.0)
MCV: 88.4 fL (ref 80.0–100.0)
Platelets: 291 10*3/uL (ref 150–440)
RBC: 3.86 MIL/uL — AB (ref 4.40–5.90)
RDW: 15.5 % — ABNORMAL HIGH (ref 11.5–14.5)
WBC: 10.2 10*3/uL (ref 3.8–10.6)

## 2018-01-09 LAB — URINALYSIS, COMPLETE (UACMP) WITH MICROSCOPIC
Bacteria, UA: NONE SEEN
Bilirubin Urine: NEGATIVE
GLUCOSE, UA: NEGATIVE mg/dL
KETONES UR: NEGATIVE mg/dL
Leukocytes, UA: NEGATIVE
NITRITE: NEGATIVE
PROTEIN: NEGATIVE mg/dL
Specific Gravity, Urine: 1.018 (ref 1.005–1.030)
pH: 6 (ref 5.0–8.0)

## 2018-01-09 LAB — LIPASE, BLOOD: LIPASE: 17 U/L (ref 11–51)

## 2018-01-09 MED ORDER — IOPAMIDOL (ISOVUE-300) INJECTION 61%
75.0000 mL | Freq: Once | INTRAVENOUS | Status: AC | PRN
  Administered 2018-01-09: 75 mL via INTRAVENOUS

## 2018-01-09 MED ORDER — METRONIDAZOLE 500 MG PO TABS: 500.0000 mg | ORAL_TABLET | Freq: Three times a day (TID) | ORAL | 0 refills | Status: DC

## 2018-01-09 MED ORDER — SODIUM CHLORIDE 0.9 % IV BOLUS (SEPSIS)
1000.0000 mL | Freq: Once | INTRAVENOUS | Status: AC
  Administered 2018-01-09: 1000 mL via INTRAVENOUS

## 2018-01-09 MED ORDER — MAGNESIUM CITRATE PO SOLN
0.5000 | Freq: Once | ORAL | Status: AC
  Administered 2018-01-09: 0.5 via ORAL
  Filled 2018-01-09: qty 296

## 2018-01-09 MED ORDER — MORPHINE SULFATE (PF) 4 MG/ML IV SOLN
4.0000 mg | Freq: Once | INTRAVENOUS | Status: AC
  Administered 2018-01-09: 4 mg via INTRAVENOUS
  Filled 2018-01-09: qty 1

## 2018-01-09 MED ORDER — ONDANSETRON 4 MG PO TBDP: 4.0000 mg | ORAL_TABLET | Freq: Three times a day (TID) | ORAL | 0 refills | Status: DC | PRN

## 2018-01-09 MED ORDER — ONDANSETRON HCL 4 MG/2ML IJ SOLN
4.0000 mg | Freq: Once | INTRAMUSCULAR | Status: AC
  Administered 2018-01-09: 4 mg via INTRAVENOUS
  Filled 2018-01-09: qty 2

## 2018-01-09 MED ORDER — POLYETHYLENE GLYCOL 3350 17 GM/SCOOP PO POWD: ORAL | 0 refills | Status: DC

## 2018-01-09 MED ORDER — CIPROFLOXACIN HCL 500 MG PO TABS: 500.0000 mg | ORAL_TABLET | Freq: Two times a day (BID) | ORAL | 0 refills | Status: DC

## 2018-01-09 NOTE — Discharge Instructions (Signed)
the CT scan shows constipation and diverticulitis. Take medications as prescribed and follow-up with your doctor. Return to the ED if your symptoms worsen.

## 2018-01-09 NOTE — ED Notes (Signed)
Foley catheter removed by this RN. Foley catheter in place when this RN took over care for this patient. 10mL removed from balloon, Foley cath tip intact on removal.

## 2018-01-09 NOTE — ED Triage Notes (Signed)
Pt reports 4 days since last BM and severe cramping in his stomach and vomiting. Pt also with low grade fever.

## 2018-01-09 NOTE — ED Provider Notes (Signed)
[facility] Emergency Department Provider Note  ____________________________________________  Time seen: Approximately 4:11 PM  I have reviewed the triage vital signs and the nursing notes.   HISTORY  Chief Complaint Constipation and Abdominal Pain    HPI Johnathan Gould is a 83 y.o. male who complains of generalized abdominal pain for the past 4 days with vomiting starting today. No hematemesis. No fever or chills. No aggravating or alleviating factors, nonradiating, moderate intensity. Last bowel movement was 4 days ago and the patient likely sustained constipation. Pain feels like cramping.     Past Medical History:  Diagnosis Date  . Arthritis   . CAD (coronary artery disease)   . Hypertension   . Prostate cancer ([place])    metastatic   . RAD (reactive airway disease)      Patient Active Problem List   Diagnosis Date Noted  . Goals of care, counseling/discussion 12/10/2016  . Anemia of chronic renal failure 11/22/2016  . Hypotension 11/20/2016  . Dehydration 11/20/2016  . Urinary retention 11/20/2016  . Prostate cancer metastatic to bone ([place]) 11/20/2016  . Prostate cancer ([place])   . Urinary obstruction 10/16/2016  . Acute renal failure (ARF) ([place]) 10/16/2016  . Bladder mass 10/16/2016  . CAD (coronary artery disease) 10/16/2016  . HTN (hypertension) 10/16/2016     Past Surgical History:  Procedure Laterality Date  . CORONARY ANGIOPLASTY WITH STENT PLACEMENT    . IR GENERIC HISTORICAL  10/17/2016   IR NEPHROSTOMY PLACEMENT RIGHT 10/17/2016 [facility]  . IR GENERIC HISTORICAL  10/17/2016   IR NEPHROSTOMY PLACEMENT LEFT 10/17/2016 [name], MD [facility]  . IR GENERIC HISTORICAL  12/23/2016   IR NEPHROSTOMY EXCHANGE LEFT 12/23/2016 [facility]  . IR GENERIC HISTORICAL  12/23/2016   IR NEPHROSTOMY EXCHANGE RIGHT 12/23/2016 [facility]  . IR NEPHROSTOMY EXCHANGE LEFT  02/11/2017  . IR NEPHROSTOMY EXCHANGE RIGHT   02/11/2017  . KNEE SURGERY Left 2006   Laparascopy done on left knee, scar tissue taken out  . nephrostomy tubes Bilateral      Prior to Admission medications   Medication Sig Start Date End Date Taking? Authorizing Provider  abiraterone acetate (ZYTIGA) 250 MG tablet Take 4 tablets (1,000 mg total) by mouth daily. Take on an empty stomach 1 hour before or 2 hours after a meal 11/03/17   Sindy Guadeloupe, MD  alendronate (FOSAMAX) 70 MG tablet Take 1 tablet (70 mg total) by mouth once a week. Take with a full glass of water on an empty stomach. 10/07/17   Sindy Guadeloupe, MD  aspirin EC 81 MG tablet Take by mouth.    [provider]  ciprofloxacin (CIPRO) 500 MG tablet Take 1 tablet (500 mg total) by mouth 2 (two) times daily. 01/09/18   Carrie Mew, MD  clotrimazole (MYCELEX) 10 MG troche Take 1 lozenge (10 mg total) by mouth 4 (four) times daily. Until white patches resolved in mouth 05/19/17   Sindy Guadeloupe, MD  gabapentin (NEURONTIN) 100 MG capsule Take 1 capsule by mouth 3 (three) times daily. 01/06/18   [provider]  hydrochlorothiazide (HYDRODIURIL) 12.5 MG tablet  12/17/16   [provider]  levofloxacin (LEVAQUIN) 750 MG tablet Take 1 tablet by mouth daily. 01/02/18   [provider]  metoprolol (LOPRESSOR) 50 MG tablet Take 50 mg by mouth 2 (two) times daily.  12/13/16   [provider]  metroNIDAZOLE (FLAGYL) 500 MG tablet Take 1 tablet (500 mg total) by mouth  3 (three) times daily. 01/09/18   Carrie Mew, MD  nitroGLYCERIN (NITROSTAT) 0.4 MG SL tablet Place 0.4 mg under the tongue every 5 (five) minutes x 3 doses as needed for chest pain. 06/23/16   [provider]  ondansetron (ZOFRAN ODT) 4 MG disintegrating tablet Take 1 tablet (4 mg total) by mouth every 8 (eight) hours as needed for nausea or vomiting. 01/09/18   Carrie Mew, MD  ondansetron (ZOFRAN) 4 MG tablet Take 1 tablet (4 mg total) by mouth every 6 (six) hours as  needed. for nausea 12/27/17   Sindy Guadeloupe, MD  oxyCODONE (OXY IR/ROXICODONE) 5 MG immediate release tablet Take 1 tablet (5 mg total) by mouth every 4 (four) hours as needed for severe pain. 01/05/18   Sindy Guadeloupe, MD  Oxycodone HCl 10 MG TABS Take 1 tablet (10 mg total) by mouth every 4 (four) hours as needed. 12/27/17   Sindy Guadeloupe, MD  polyethylene glycol powder (GLYCOLAX/MIRALAX) powder 2 cap fulls in a full glass of water, two times a day, for 3 days. 01/09/18   Carrie Mew, MD  predniSONE (DELTASONE) 5 MG tablet Take 1 tablet (5 mg total) by mouth 2 (two) times daily with a meal. 11/03/17   Sindy Guadeloupe, MD  sennosides-docusate sodium (SENOKOT-S) 8.6-50 MG tablet Take 2 tablets by mouth 2 (two) times daily.     [provider]  tamsulosin (FLOMAX) 0.4 MG CAPS capsule Take 1 capsule (0.4 mg total) by mouth daily. 09/30/17   Hollice Espy, MD     Allergies Patient has no known allergies.   Family History  Problem Relation Age of Onset  . Hypertension Other   . Lung cancer Brother     Social History Social History   Tobacco Use  . Smoking status: Former Smoker    Packs/day: 1.00    Years: 69.50    Pack years: 69.50    Last attempt to quit: 11/04/2014    Years since quitting: 3.1  . Smokeless tobacco: Never Used  Substance Use Topics  . Alcohol use: No  . Drug use: No    Review of Systems  Constitutional:   No fever or chills.  ENT:   No sore throat. No rhinorrhea. Cardiovascular:   No chest pain or syncope. Respiratory:   No dyspnea or cough. Gastrointestinal:   positive abdominal pain vomiting and constipation..  Musculoskeletal:   Negative for focal pain or swelling All other systems reviewed and are negative except as documented above in ROS and HPI.  ____________________________________________   PHYSICAL EXAM:  VITAL SIGNS: ED Triage Vitals  Enc Vitals Group     BP 01/09/18 1128 120/60     Pulse Rate 01/09/18 1128 75     Resp 01/09/18  1128 (!) 22     Temp 01/09/18 1128 100.2 F (37.9 C)     Temp Source 01/09/18 1128 Oral     SpO2 01/09/18 1128 94 %     Weight 01/09/18 1137 190 lb (86.2 kg)     Height 01/09/18 1137 5\' 8"  (1.727 m)     Head Circumference --      Peak Flow --      Pain Score 01/09/18 1137 10     Pain Loc --      Pain Edu? --      Excl. in [place]? --     Vital signs reviewed, nursing assessments reviewed.   Constitutional:   Alert and oriented. Well appearing and in  no distress. Eyes:   No scleral icterus.  EOMI. No nystagmus. No conjunctival pallor. PERRL. ENT   Head:   Normocephalic and atraumatic.   Nose:   No congestion/rhinnorhea.    Mouth/Throat:   MMM, no pharyngeal erythema. No peritonsillar mass.    Neck:   No meningismus. Full ROM. Hematological/Lymphatic/Immunilogical:   No cervical lymphadenopathy. Cardiovascular:   RRR. Symmetric bilateral radial and DP pulses.  No murmurs.  Respiratory:   Normal respiratory effort without tachypnea/retractions. Breath sounds are clear and equal bilaterally. No wheezes/rales/rhonchi. Gastrointestinal:   with mildly distended abdomen. Soft. Diffusely tender, worse on the left side. There is abdominal fullness on the left.. There is no CVA tenderness.  No rebound, rigidity, or guarding. Genitourinary:   normal, no hernias Musculoskeletal:   Normal range of motion in all extremities. No joint effusions.  No lower extremity tenderness.  No edema. Neurologic:   Normal speech and language.  Motor grossly intact. No acute focal neurologic deficits are appreciated.  Skin:    Skin is warm, dry and intact. No rash noted.  No petechiae, purpura, or bullae.  ____________________________________________    LABS (pertinent positives/negatives) (all labs ordered are listed, but only abnormal results are displayed) Labs Reviewed  COMPREHENSIVE METABOLIC PANEL - Abnormal; Notable for the following components:      Result Value   Potassium 3.0 (*)     Chloride 97 (*)    Glucose, Bld 143 (*)    BUN 59 (*)    Creatinine, Ser 1.94 (*)    Calcium 8.6 (*)    Albumin 3.3 (*)    ALT 16 (*)    GFR calc non Af Amer 31 (*)    GFR calc Af Amer 36 (*)    All other components within normal limits  CBC - Abnormal; Notable for the following components:   RBC 3.86 (*)    Hemoglobin 11.1 (*)    HCT 34.1 (*)    RDW 15.5 (*)    All other components within normal limits  URINALYSIS, COMPLETE (UACMP) WITH MICROSCOPIC - Abnormal; Notable for the following components:   Color, Urine YELLOW (*)    APPearance CLEAR (*)    Hgb urine dipstick SMALL (*)    Squamous Epithelial / LPF 0-5 (*)    All other components within normal limits  LIPASE, BLOOD   ____________________________________________   EKG    ____________________________________________    RADIOLOGY  Ct Abdomen Pelvis W Contrast  Result Date: 01/09/2018 CLINICAL DATA:  Abdominal distension. Nausea and vomiting. Low-grade fever. History of metastatic prostate cancer. EXAM: CT ABDOMEN AND PELVIS WITH CONTRAST TECHNIQUE: Multidetector CT imaging of the abdomen and pelvis was performed using the standard protocol following bolus administration of intravenous contrast. CONTRAST:  30mL ISOVUE-300 IOPAMIDOL (ISOVUE-300) INJECTION 61% COMPARISON:  10/21/2017 FINDINGS: Lower chest: Subsegmental atelectasis in the lung bases. No pleural effusion. Coronary artery atherosclerosis. Hepatobiliary: No focal liver abnormality is seen. No gallstones, gallbladder wall thickening, or biliary dilatation. Pancreas: Unremarkable. Spleen: Unremarkable. Adrenals/Urinary Tract: Unremarkable adrenal glands. Subcentimeter low-density lesions in the upper poles of both kidneys, too small to fully characterize. Slight fullness of the renal collecting systems and ureters with questionable mild urothelial thickening bilaterally. No calculi. Moderately distended bladder without wall thickening. Mild impression on the  bladder base by the prostate gland. Stomach/Bowel: Suspected tiny sliding hiatal hernia. Small diverticulum of the third portion of the duodenum without inflammation. No bowel obstruction. Moderate colonic stool burden. Left-sided colonic diverticulosis with very mild stranding adjacent  to the proximal to mid sigmoid colon which is new. Unremarkable appendix. Vascular/Lymphatic: Abdominal aortic atherosclerosis without aneurysm. Similar appearance of small periaortic lymph nodes measuring up to 7 mm in short axis. Reproductive: No significant prostatomegaly. Other: No ascites. No abdominal wall hernia. Musculoskeletal: Unchanged widespread sclerotic osseous metastases. No acute osseous abnormality identified. IMPRESSION: 1. Very mild stranding adjacent to the sigmoid colon suspicious for mild acute diverticulitis. 2. Moderate colonic stool burden.  No bowel obstruction. 3. Questionable mild urothelial thickening bilaterally. Correlate for signs of urinary tract infection. 4. Unchanged subcentimeter retroperitoneal lymph nodes and diffuse sclerotic osseous metastases. 5.  Aortic Atherosclerosis (ICD10-I70.0). Electronically Signed   By: Logan Bores M.D.   On: 01/09/2018 13:42    ____________________________________________   PROCEDURES Procedures  ____________________________________________    CLINICAL IMPRESSION / ASSESSMENT AND PLAN / ED COURSE  Pertinent labs & imaging results that were available during my care of the patient were reviewed by me and considered in my medical decision making (see chart for details).     Clinical Course as of Jan 09 1610  Mon Jan 09, 2018  1204 presents with generalized abdominal pain and distention. I doubt AAA or mesenteric ischemia. We'll need to obtain labs and a CT scan to evaluate for bowel obstruction or perforation versus intra-abdominal abscess related to metastatic prostate cancer. Symptom control with IV fluids, morphine, Zofran.  [PS]     Clinical Course User Index [PS] Carrie Mew, MD    ----------------------------------------- 4:28 PM on 01/09/2018 -----------------------------------------  CT reveals constipation and mild diverticulitis. No evidence of abscess or perforation. Results discussed with the patient, offered admission, he prefers to go home. I think this is reasonable, butI'll prescribe him oral Cipro and Flagyl given his advanced age and use of chemotherapy. Zofran, MiraLAX for constipation. Return precautions.no evidence of severe infection or sepsis at this time.   ____________________________________________   FINAL CLINICAL IMPRESSION(S) / ED DIAGNOSES    Final diagnoses:  Constipation, unspecified constipation type  Generalized abdominal pain  Diverticulitis large intestine w/o perforation or abscess w/o bleeding     ED Discharge Orders        Ordered    metroNIDAZOLE (FLAGYL) 500 MG tablet  3 times daily     01/09/18 1610    ciprofloxacin (CIPRO) 500 MG tablet  2 times daily     01/09/18 1610    ondansetron (ZOFRAN ODT) 4 MG disintegrating tablet  Every 8 hours PRN     01/09/18 1610    polyethylene glycol powder (GLYCOLAX/MIRALAX) powder     01/09/18 1610      Portions of this note were generated with dragon dictation software. Dictation errors may occur despite best attempts at proofreading.    Carrie Mew, MD 01/09/18 [phone]

## 2018-01-20 MED FILL — predniSONE 5 MG TABS: 5 | 30 days supply | Qty: 60 | Fill #2

## 2018-01-20 MED FILL — ZYTIGA 250 MG TABLET: 250 | 30 days supply | Qty: 120 | Fill #2

## 2018-01-25 ENCOUNTER — Telehealth: Payer: Self-pay | Admitting: Pharmacist

## 2018-01-25 NOTE — Telephone Encounter (Signed)
Oral Chemotherapy Pharmacist Encounter  Follow-Up Form  Called patient today to follow up regarding patient's oral chemotherapy medication: Zytiga (abiraterone)  Original Start date of oral chemotherapy: 12/2016  Pt reports 0 tablets/doses of Zytiga (abiraterone) missed so far this month.   Pt reports the following side effects: N/A  Recent labs reviewed: PSA from 01/05/18  New medications?: None, reported  Other Issues: Patient recently went to the ER for constipation. He reports that his bowels have been regular since then.   Patient knows to call the office with questions or concerns. [facility] will continue to follow.  Darl Pikes, PharmD, BCPS Hematology/Oncology Clinical Pharmacist [facility] [phone]  01/25/2018 10:43 AM

## 2018-02-03 ENCOUNTER — Inpatient Hospital Stay: Payer: Medicare Other

## 2018-02-03 ENCOUNTER — Other Ambulatory Visit: Payer: Self-pay | Admitting: Oncology

## 2018-02-03 ENCOUNTER — Encounter: Payer: Self-pay | Admitting: Oncology

## 2018-02-03 ENCOUNTER — Inpatient Hospital Stay: Payer: Medicare Other | Attending: Oncology | Admitting: Oncology

## 2018-02-03 ENCOUNTER — Telehealth: Payer: Self-pay | Admitting: *Deleted

## 2018-02-03 ENCOUNTER — Telehealth: Payer: Self-pay | Admitting: Oncology

## 2018-02-03 VITALS — BP 144/88 | HR 81 | Temp 98.8°F | Resp 18 | Ht 68.0 in | Wt 191.8 lb

## 2018-02-03 DIAGNOSIS — K2100 Gastro-esophageal reflux disease with esophagitis, without bleeding: Secondary | ICD-10-CM

## 2018-02-03 DIAGNOSIS — K5900 Constipation, unspecified: Secondary | ICD-10-CM

## 2018-02-03 DIAGNOSIS — Z5111 Encounter for antineoplastic chemotherapy: Secondary | ICD-10-CM | POA: Diagnosis present

## 2018-02-03 DIAGNOSIS — G893 Neoplasm related pain (acute) (chronic): Secondary | ICD-10-CM

## 2018-02-03 DIAGNOSIS — N189 Chronic kidney disease, unspecified: Secondary | ICD-10-CM | POA: Diagnosis not present

## 2018-02-03 DIAGNOSIS — D631 Anemia in chronic kidney disease: Secondary | ICD-10-CM

## 2018-02-03 DIAGNOSIS — C61 Malignant neoplasm of prostate: Secondary | ICD-10-CM

## 2018-02-03 DIAGNOSIS — C7951 Secondary malignant neoplasm of bone: Principal | ICD-10-CM

## 2018-02-03 DIAGNOSIS — R635 Abnormal weight gain: Secondary | ICD-10-CM | POA: Diagnosis not present

## 2018-02-03 LAB — CBC WITH DIFFERENTIAL/PLATELET
BASOS ABS: 0.1 10*3/uL (ref 0–0.1)
Basophils Relative: 1 %
EOS PCT: 1 %
Eosinophils Absolute: 0 10*3/uL (ref 0–0.7)
HEMATOCRIT: 36 % — AB (ref 40.0–52.0)
Hemoglobin: 12 g/dL — ABNORMAL LOW (ref 13.0–18.0)
Lymphocytes Relative: 9 %
Lymphs Abs: 0.8 10*3/uL — ABNORMAL LOW (ref 1.0–3.6)
MCH: 29.6 pg (ref 26.0–34.0)
MCHC: 33.3 g/dL (ref 32.0–36.0)
MCV: 89 fL (ref 80.0–100.0)
MONO ABS: 0.5 10*3/uL (ref 0.2–1.0)
MONOS PCT: 6 %
NEUTROS ABS: 7.4 10*3/uL — AB (ref 1.4–6.5)
Neutrophils Relative %: 83 %
PLATELETS: 328 10*3/uL (ref 150–440)
RBC: 4.04 MIL/uL — ABNORMAL LOW (ref 4.40–5.90)
RDW: 15.6 % — AB (ref 11.5–14.5)
WBC: 8.9 10*3/uL (ref 3.8–10.6)

## 2018-02-03 LAB — COMPREHENSIVE METABOLIC PANEL
ALT: 18 U/L (ref 17–63)
ANION GAP: 11 (ref 5–15)
AST: 17 U/L (ref 15–41)
Albumin: 3.4 g/dL — ABNORMAL LOW (ref 3.5–5.0)
Alkaline Phosphatase: 68 U/L (ref 38–126)
BILIRUBIN TOTAL: 0.4 mg/dL (ref 0.3–1.2)
BUN: 37 mg/dL — ABNORMAL HIGH (ref 6–20)
CHLORIDE: 98 mmol/L — AB (ref 101–111)
CO2: 28 mmol/L (ref 22–32)
Calcium: 9 mg/dL (ref 8.9–10.3)
Creatinine, Ser: 1.67 mg/dL — ABNORMAL HIGH (ref 0.61–1.24)
GFR calc Af Amer: 43 mL/min — ABNORMAL LOW (ref >60)
GFR, EST NON AFRICAN AMERICAN: 37 mL/min — AB (ref >60)
Glucose, Bld: 127 mg/dL — ABNORMAL HIGH (ref 65–99)
POTASSIUM: 3.5 mmol/L (ref 3.5–5.1)
Sodium: 137 mmol/L (ref 135–145)
TOTAL PROTEIN: 6.8 g/dL (ref 6.5–8.1)

## 2018-02-03 LAB — PSA: PROSTATIC SPECIFIC ANTIGEN: 0.02 ng/mL (ref 0.00–4.00)

## 2018-02-03 MED ORDER — FENTANYL 12 MCG/HR TD PT72: 12.5000 ug | MEDICATED_PATCH | TRANSDERMAL | 0 refills | Status: DC

## 2018-02-03 MED ORDER — OXYCODONE HCL 5 MG PO TABS: 5.0000 mg | ORAL_TABLET | Freq: Four times a day (QID) | ORAL | 0 refills | Status: DC | PRN

## 2018-02-03 MED ORDER — LEUPROLIDE ACETATE (3 MONTH) 22.5 MG IM KIT
22.5000 mg | PACK | Freq: Once | INTRAMUSCULAR | Status: AC
  Administered 2018-02-03: 22.5 mg via INTRAMUSCULAR
  Filled 2018-02-03: qty 22.5

## 2018-02-03 MED ORDER — OXYCODONE HCL 5 MG PO TABS: 15.0000 mg | ORAL_TABLET | Freq: Four times a day (QID) | ORAL | 0 refills | Status: DC | PRN

## 2018-02-03 MED ORDER — OXYCODONE HCL 10 MG PO TABS: 10.0000 mg | ORAL_TABLET | Freq: Four times a day (QID) | ORAL | 0 refills | Status: DC | PRN

## 2018-02-03 NOTE — Telephone Encounter (Signed)
[name] called and want to clarify oxycodone prescription. Called and talked to Brandon.  She told me that Johnathan Gould is not going to fill the 2 different strength of oxycodone prescription that Dr. [name] has sent today. Per Dr. Elroy Channel RN note, patient is supposed to take 15mg  oxycodone IR every 6 hours as needed.  Will discontinue the 5 mg oxycodone and 10 mg oxycodone prescription.  In the reorder oxycodone 15mg  Q6h for pain (3 tablets of 5mg ). I will him 48 tablets to cover the weekend. Next Monday, Dr.Rao can resent his Oxycodone Rx for 30 days supply if indicated.

## 2018-02-03 NOTE — Progress Notes (Signed)
Increase indigestion x 7 days  , increase pain noted to lower back ( 10)

## 2018-02-03 NOTE — Telephone Encounter (Signed)
Called pt about his pain meds but he was not home and I left a message. When he was in the office today he had spoke about his pain is worse. Dr. [name] sent in oxycodone 10 mg and 5 mg for  A total of 15 mg every 6 hours.  The oxycontin that she was going to use for long acting was not on preferred list for his insurance so she changed it to Duragesic patch.  I told him that he has had it in the past for pain but got to feeling better and did not need it any longer.  He is to remember that with the additional pain med can cause more constipation that he may need to take more senokot and miralax when needed for constipation. I will await for him to call me back if he has questions and the pain meds was sent in to his pharmacy

## 2018-02-05 ENCOUNTER — Emergency Department: Payer: Medicare Other

## 2018-02-05 ENCOUNTER — Encounter: Payer: Self-pay | Admitting: Emergency Medicine

## 2018-02-05 ENCOUNTER — Inpatient Hospital Stay
Admission: EM | Admit: 2018-02-05 | Discharge: 2018-02-10 | DRG: 247 | Disposition: A | Discharge status: Skilled Nursing Facility | Payer: Medicare Other | Attending: Family Medicine | Admitting: Family Medicine

## 2018-02-05 ENCOUNTER — Other Ambulatory Visit: Payer: Self-pay

## 2018-02-05 DIAGNOSIS — N183 Chronic kidney disease, stage 3 (moderate): Secondary | ICD-10-CM | POA: Diagnosis present

## 2018-02-05 DIAGNOSIS — Z8249 Family history of ischemic heart disease and other diseases of the circulatory system: Secondary | ICD-10-CM

## 2018-02-05 DIAGNOSIS — T458X5A Adverse effect of other primarily systemic and hematological agents, initial encounter: Secondary | ICD-10-CM | POA: Diagnosis present

## 2018-02-05 DIAGNOSIS — M1990 Unspecified osteoarthritis, unspecified site: Secondary | ICD-10-CM | POA: Diagnosis present

## 2018-02-05 DIAGNOSIS — I129 Hypertensive chronic kidney disease with stage 1 through stage 4 chronic kidney disease, or unspecified chronic kidney disease: Secondary | ICD-10-CM | POA: Diagnosis present

## 2018-02-05 DIAGNOSIS — Z801 Family history of malignant neoplasm of trachea, bronchus and lung: Secondary | ICD-10-CM

## 2018-02-05 DIAGNOSIS — R079 Chest pain, unspecified: Secondary | ICD-10-CM

## 2018-02-05 DIAGNOSIS — Z87891 Personal history of nicotine dependence: Secondary | ICD-10-CM

## 2018-02-05 DIAGNOSIS — I2510 Atherosclerotic heart disease of native coronary artery without angina pectoris: Secondary | ICD-10-CM | POA: Diagnosis present

## 2018-02-05 DIAGNOSIS — I4510 Unspecified right bundle-branch block: Secondary | ICD-10-CM | POA: Diagnosis present

## 2018-02-05 DIAGNOSIS — Z66 Do not resuscitate: Secondary | ICD-10-CM | POA: Diagnosis present

## 2018-02-05 DIAGNOSIS — Z7952 Long term (current) use of systemic steroids: Secondary | ICD-10-CM

## 2018-02-05 DIAGNOSIS — C7951 Secondary malignant neoplasm of bone: Secondary | ICD-10-CM | POA: Diagnosis present

## 2018-02-05 DIAGNOSIS — Z7982 Long term (current) use of aspirin: Secondary | ICD-10-CM

## 2018-02-05 DIAGNOSIS — R1013 Epigastric pain: Secondary | ICD-10-CM | POA: Diagnosis present

## 2018-02-05 DIAGNOSIS — K2900 Acute gastritis without bleeding: Secondary | ICD-10-CM | POA: Diagnosis present

## 2018-02-05 DIAGNOSIS — I214 Non-ST elevation (NSTEMI) myocardial infarction: Secondary | ICD-10-CM | POA: Diagnosis not present

## 2018-02-05 DIAGNOSIS — J45909 Unspecified asthma, uncomplicated: Secondary | ICD-10-CM | POA: Diagnosis present

## 2018-02-05 DIAGNOSIS — Z515 Encounter for palliative care: Secondary | ICD-10-CM

## 2018-02-05 DIAGNOSIS — R0902 Hypoxemia: Secondary | ICD-10-CM

## 2018-02-05 DIAGNOSIS — I959 Hypotension, unspecified: Secondary | ICD-10-CM | POA: Diagnosis not present

## 2018-02-05 DIAGNOSIS — R339 Retention of urine, unspecified: Secondary | ICD-10-CM | POA: Diagnosis not present

## 2018-02-05 DIAGNOSIS — Z7983 Long term (current) use of bisphosphonates: Secondary | ICD-10-CM

## 2018-02-05 DIAGNOSIS — E876 Hypokalemia: Secondary | ICD-10-CM | POA: Diagnosis present

## 2018-02-05 DIAGNOSIS — Z955 Presence of coronary angioplasty implant and graft: Secondary | ICD-10-CM

## 2018-02-05 DIAGNOSIS — C61 Malignant neoplasm of prostate: Secondary | ICD-10-CM | POA: Diagnosis present

## 2018-02-05 LAB — HEPATIC FUNCTION PANEL
ALBUMIN: 3.3 g/dL — AB (ref 3.5–5.0)
ALK PHOS: 69 U/L (ref 38–126)
ALT: 17 U/L (ref 17–63)
AST: 22 U/L (ref 15–41)
BILIRUBIN TOTAL: 0.5 mg/dL (ref 0.3–1.2)
Bilirubin, Direct: <0.1 mg/dL — ABNORMAL LOW (ref 0.1–0.5)
Total Protein: 7 g/dL (ref 6.5–8.1)

## 2018-02-05 LAB — CBC
HCT: 38 % — ABNORMAL LOW (ref 40.0–52.0)
HEMOGLOBIN: 12.7 g/dL — AB (ref 13.0–18.0)
MCH: 29.2 pg (ref 26.0–34.0)
MCHC: 33.3 g/dL (ref 32.0–36.0)
MCV: 87.6 fL (ref 80.0–100.0)
Platelets: 274 10*3/uL (ref 150–440)
RBC: 4.34 MIL/uL — AB (ref 4.40–5.90)
RDW: 15.5 % — ABNORMAL HIGH (ref 11.5–14.5)
WBC: 8.6 10*3/uL (ref 3.8–10.6)

## 2018-02-05 LAB — BASIC METABOLIC PANEL
Anion gap: 9 (ref 5–15)
BUN: 34 mg/dL — ABNORMAL HIGH (ref 6–20)
CHLORIDE: 96 mmol/L — AB (ref 101–111)
CO2: 31 mmol/L (ref 22–32)
Calcium: 8.8 mg/dL — ABNORMAL LOW (ref 8.9–10.3)
Creatinine, Ser: 1.49 mg/dL — ABNORMAL HIGH (ref 0.61–1.24)
GFR calc non Af Amer: 43 mL/min — ABNORMAL LOW (ref >60)
GFR, EST AFRICAN AMERICAN: 49 mL/min — AB (ref >60)
GLUCOSE: 120 mg/dL — AB (ref 65–99)
Potassium: 3 mmol/L — ABNORMAL LOW (ref 3.5–5.1)
Sodium: 136 mmol/L (ref 135–145)

## 2018-02-05 LAB — LIPASE, BLOOD: Lipase: 25 U/L (ref 11–51)

## 2018-02-05 LAB — TROPONIN I: Troponin I: <0.03 ng/mL (ref <0.03)

## 2018-02-05 MED ORDER — SENNA 8.6 MG PO TABS
1.0000 | ORAL_TABLET | Freq: Two times a day (BID) | ORAL | Status: DC
  Administered 2018-02-05 (×2): 8.6 mg via ORAL
  Filled 2018-02-05 (×2): qty 1

## 2018-02-05 MED ORDER — MORPHINE SULFATE (PF) 4 MG/ML IV SOLN
INTRAVENOUS | Status: AC
  Filled 2018-02-05: qty 1

## 2018-02-05 MED ORDER — GI COCKTAIL ~~LOC~~
30.0000 mL | Freq: Once | ORAL | Status: AC
Start: 2018-02-05 — End: 2018-02-05
  Administered 2018-02-05: 30 mL via ORAL
  Filled 2018-02-05: qty 30

## 2018-02-05 MED ORDER — PANTOPRAZOLE SODIUM 40 MG IV SOLR
40.0000 mg | Freq: Two times a day (BID) | INTRAVENOUS | Status: DC
  Administered 2018-02-05: 40 mg via INTRAVENOUS
  Filled 2018-02-05 (×3): qty 40

## 2018-02-05 MED ORDER — SODIUM CHLORIDE 0.9 % IV SOLN
INTRAVENOUS | Status: DC
  Administered 2018-02-05 – 2018-02-06 (×2): via INTRAVENOUS

## 2018-02-05 MED ORDER — GABAPENTIN 100 MG PO CAPS
100.0000 mg | ORAL_CAPSULE | Freq: Three times a day (TID) | ORAL | Status: DC
  Administered 2018-02-05 – 2018-02-10 (×12): 100 mg via ORAL
  Filled 2018-02-05 (×13): qty 1

## 2018-02-05 MED ORDER — FENTANYL 12 MCG/HR TD PT72
12.5000 ug | MEDICATED_PATCH | TRANSDERMAL | Status: DC
  Administered 2018-02-05 – 2018-02-08 (×2): 12.5 ug via TRANSDERMAL
  Filled 2018-02-05 (×2): qty 1

## 2018-02-05 MED ORDER — TAMSULOSIN HCL 0.4 MG PO CAPS
0.4000 mg | ORAL_CAPSULE | Freq: Every day | ORAL | Status: DC
  Administered 2018-02-05 – 2018-02-10 (×4): 0.4 mg via ORAL
  Filled 2018-02-05 (×4): qty 1

## 2018-02-05 MED ORDER — ACETAMINOPHEN 325 MG PO TABS
650.0000 mg | ORAL_TABLET | Freq: Four times a day (QID) | ORAL | Status: DC | PRN
  Administered 2018-02-10: 650 mg via ORAL
  Filled 2018-02-05: qty 2

## 2018-02-05 MED ORDER — ONDANSETRON HCL 4 MG/2ML IJ SOLN
4.0000 mg | Freq: Once | INTRAMUSCULAR | Status: AC
  Administered 2018-02-05: 4 mg via INTRAVENOUS
  Filled 2018-02-05: qty 2

## 2018-02-05 MED ORDER — MORPHINE SULFATE (PF) 4 MG/ML IV SOLN
4.0000 mg | Freq: Once | INTRAVENOUS | Status: AC
  Administered 2018-02-05: 4 mg via INTRAVENOUS

## 2018-02-05 MED ORDER — OXYCODONE HCL 5 MG PO TABS
15.0000 mg | ORAL_TABLET | Freq: Four times a day (QID) | ORAL | Status: DC | PRN
  Administered 2018-02-05 (×2): 15 mg via ORAL
  Filled 2018-02-05 (×2): qty 3

## 2018-02-05 MED ORDER — ENOXAPARIN SODIUM 40 MG/0.4ML ~~LOC~~ SOLN
40.0000 mg | SUBCUTANEOUS | Status: DC
  Administered 2018-02-05: 40 mg via SUBCUTANEOUS
  Filled 2018-02-05: qty 0.4

## 2018-02-05 MED ORDER — ABIRATERONE ACETATE 250 MG PO TABS
1000.0000 mg | ORAL_TABLET | Freq: Every day | ORAL | Status: DC
  Administered 2018-02-05: 1000 mg via ORAL
  Filled 2018-02-05: qty 4

## 2018-02-05 MED ORDER — POLYETHYLENE GLYCOL 3350 17 G PO PACK: 17.0000 g | PACK | Freq: Every day | ORAL | Status: DC | PRN

## 2018-02-05 MED ORDER — ACETAMINOPHEN 650 MG RE SUPP: 650.0000 mg | Freq: Four times a day (QID) | RECTAL | Status: DC | PRN

## 2018-02-05 MED ORDER — METOPROLOL TARTRATE 50 MG PO TABS
50.0000 mg | ORAL_TABLET | Freq: Two times a day (BID) | ORAL | Status: DC
  Filled 2018-02-05: qty 1

## 2018-02-05 MED ORDER — ONDANSETRON HCL 4 MG/2ML IJ SOLN
4.0000 mg | Freq: Four times a day (QID) | INTRAMUSCULAR | Status: DC | PRN
  Administered 2018-02-05 – 2018-02-08 (×7): 4 mg via INTRAVENOUS
  Filled 2018-02-05 (×10): qty 2

## 2018-02-05 MED ORDER — ONDANSETRON HCL 4 MG PO TABS: 4.0000 mg | ORAL_TABLET | Freq: Four times a day (QID) | ORAL | Status: DC | PRN

## 2018-02-05 MED ORDER — SENNOSIDES-DOCUSATE SODIUM 8.6-50 MG PO TABS
2.0000 | ORAL_TABLET | Freq: Two times a day (BID) | ORAL | Status: DC
  Administered 2018-02-05 – 2018-02-10 (×7): 2 via ORAL
  Filled 2018-02-05 (×10): qty 2

## 2018-02-05 MED ORDER — PROMETHAZINE HCL 25 MG/ML IJ SOLN
25.0000 mg | Freq: Once | INTRAMUSCULAR | Status: AC
  Administered 2018-02-05: 25 mg via INTRAVENOUS
  Filled 2018-02-05: qty 1

## 2018-02-05 MED ORDER — SODIUM CHLORIDE 0.9 % IV SOLN
Freq: Once | INTRAVENOUS | Status: AC
  Administered 2018-02-05: 14:00:00 via INTRAVENOUS

## 2018-02-05 MED ORDER — SUCRALFATE 1 GM/10ML PO SUSP
1.0000 g | Freq: Three times a day (TID) | ORAL | Status: DC
  Administered 2018-02-05 – 2018-02-10 (×16): 1 g via ORAL
  Filled 2018-02-05 (×19): qty 10

## 2018-02-05 MED ORDER — IOHEXOL 350 MG/ML SOLN
75.0000 mL | Freq: Once | INTRAVENOUS | Status: AC | PRN
  Administered 2018-02-05: 75 mL via INTRAVENOUS

## 2018-02-05 MED ORDER — PANTOPRAZOLE SODIUM 40 MG IV SOLR
40.0000 mg | Freq: Once | INTRAVENOUS | Status: AC
  Administered 2018-02-05: 40 mg via INTRAVENOUS
  Filled 2018-02-05: qty 40

## 2018-02-05 MED ORDER — MORPHINE SULFATE (PF) 4 MG/ML IV SOLN
4.0000 mg | Freq: Once | INTRAVENOUS | Status: AC
  Administered 2018-02-05: 4 mg via INTRAVENOUS
  Filled 2018-02-05: qty 1

## 2018-02-05 NOTE — ED Notes (Signed)
Alendronate Sodium once per week is what patient is concerned with the esophagus.

## 2018-02-05 NOTE — ED Notes (Signed)
Patient transported to X-ray 

## 2018-02-05 NOTE — Consult Note (Signed)
[facility] GI Inpatient Consult Note   Kathline Magic, M.D.  Reason for Consult: Epigastric pain, Nausea and vomiting   Attending Requesting Consult: Fritzi Mandes, M.D.  Outpatient Primary Physician: [name], M.D.  History of Present Illness: Johnathan Gould is a 83 y.o. male with stage IV prostate cancer presenting for 4 days of progressive epigastric pain, nausea and vomiting after taking Fosamax on an empty stomach.  Patient denies any hematemesis.  He continues to have pain which is been unrelieved by morphine and fentanyl patch thus far.  Patient denies any melena, dysphagia or hematochezia.  He is nave to both colonoscopy and upper endoscopy.  All symptoms at this time, however, are upper GI in nature.  Past Medical History:  Past Medical History:  Diagnosis Date  . Arthritis   . CAD (coronary artery disease)   . Hypertension   . Prostate cancer ([place])    metastatic   . RAD (reactive airway disease)     Problem List: Patient Active Problem List   Diagnosis Date Noted  . Epigastric abdominal pain 02/05/2018  . Goals of care, counseling/discussion 12/10/2016  . Anemia of chronic renal failure 11/22/2016  . Hypotension 11/20/2016  . Dehydration 11/20/2016  . Urinary retention 11/20/2016  . Prostate cancer metastatic to bone ([place]) 11/20/2016  . Prostate cancer ([place])   . Urinary obstruction 10/16/2016  . Acute renal failure (ARF) ([place]) 10/16/2016  . Bladder mass 10/16/2016  . CAD (coronary artery disease) 10/16/2016  . HTN (hypertension) 10/16/2016    Past Surgical History: Past Surgical History:  Procedure Laterality Date  . CORONARY ANGIOPLASTY WITH STENT PLACEMENT    . IR GENERIC HISTORICAL  10/17/2016   IR NEPHROSTOMY PLACEMENT RIGHT 10/17/2016 [facility]  . IR GENERIC HISTORICAL  10/17/2016   IR NEPHROSTOMY PLACEMENT LEFT 10/17/2016 [name], MD [facility]  . IR GENERIC HISTORICAL  12/23/2016   IR NEPHROSTOMY EXCHANGE LEFT  12/23/2016 [facility]  . IR GENERIC HISTORICAL  12/23/2016   IR NEPHROSTOMY EXCHANGE RIGHT 12/23/2016 [facility]  . IR NEPHROSTOMY EXCHANGE LEFT  02/11/2017  . IR NEPHROSTOMY EXCHANGE RIGHT  02/11/2017  . KNEE SURGERY Left 2006   Laparascopy done on left knee, scar tissue taken out  . nephrostomy tubes Bilateral     Allergies: No Known Allergies  Home Medications: Medications Prior to Admission  Medication Sig Dispense Refill Last Dose  . abiraterone acetate (ZYTIGA) 250 MG tablet Take 4 tablets (1,000 mg total) by mouth daily. Take on an empty stomach 1 hour before or 2 hours after a meal 120 tablet 2 Past Week at Unknown time  . alendronate (FOSAMAX) 70 MG tablet Take 1 tablet (70 mg total) by mouth once a week. Take with a full glass of water on an empty stomach. 4 tablet 6 Past Week at Unknown time  . aspirin EC 81 MG tablet Take 81 mg by mouth daily.    Past Week at Unknown time  . fentaNYL (DURAGESIC - DOSED MCG/HR) 12 MCG/HR Place 1 patch (12.5 mcg total) onto the skin every 3 (three) days. 10 patch 0 Past Week at Unknown time  . gabapentin (NEURONTIN) 100 MG capsule Take 1 capsule by mouth 3 (three) times daily.   Past Week at Unknown time  . metoprolol (LOPRESSOR) 50 MG tablet Take 50 mg by mouth 2 (two) times daily.    Past Week at Unknown time  . predniSONE (DELTASONE) 5 MG tablet Take 1 tablet (5 mg total) by mouth 2 (  two) times daily with a meal. 60 tablet 5 Past Week at Unknown time  . sennosides-docusate sodium (SENOKOT-S) 8.6-50 MG tablet Take 2 tablets by mouth 2 (two) times daily.    Past Week at Unknown time  . tamsulosin (FLOMAX) 0.4 MG CAPS capsule Take 1 capsule (0.4 mg total) by mouth daily. 30 capsule 11 Past Week at Unknown time  . nitroGLYCERIN (NITROSTAT) 0.4 MG SL tablet Place 0.4 mg under the tongue every 5 (five) minutes x 3 doses as needed for chest pain.   PRN at PRN  . oxyCODONE (OXY IR/ROXICODONE) 5 MG immediate release tablet Take 3 tablets (15 mg  total) by mouth every 6 (six) hours as needed for severe pain. 48 tablet 0 PRN at PRN   Home medication reconciliation was completed with the patient.   Scheduled Inpatient Medications:   . abiraterone acetate  1,000 mg Oral Daily  . enoxaparin (LOVENOX) injection  40 mg Subcutaneous Q24H  . fentaNYL  12.5 mcg Transdermal Q72H  . gabapentin  100 mg Oral TID  . metoprolol tartrate  50 mg Oral BID  . morphine      . pantoprazole (PROTONIX) IV  40 mg Intravenous Q12H  . senna  1 tablet Oral BID  . senna-docusate  2 tablet Oral BID  . tamsulosin  0.4 mg Oral Daily    Continuous Inpatient Infusions:    PRN Inpatient Medications:  acetaminophen **OR** acetaminophen, ondansetron **OR** ondansetron (ZOFRAN) IV, oxyCODONE, polyethylene glycol  Family History: family history includes Hypertension in his other; Lung cancer in his brother.   GI Family History: Negative  Social History:   reports that he quit smoking about 3 years ago. He has a 69.50 pack-year smoking history. He has never used smokeless tobacco. He reports that he does not drink alcohol or use drugs. The patient denies ETOH, tobacco, or drug use.    Review of Systems: Review of Systems - Negative except History of present illness  Physical Examination: BP (!) 122/95 (BP Location: Right Arm)   Pulse (!) 106   Temp 98.1 F (36.7 C)   Resp (!) 23   Ht 5\' 8"  (1.727 m)   Wt 86.6 kg (191 lb)   SpO2 100%   BMI 29.04 kg/m  Physical Exam  Constitutional: He is oriented to person, place, and time. He appears well-developed and well-nourished. He appears distressed.  HENT:  Head: Normocephalic and atraumatic.  Eyes: Pupils are equal, round, and reactive to light.  Cardiovascular: Regular rhythm and normal pulses.  No murmur heard. Pulmonary/Chest: Effort normal and breath sounds normal.  Abdominal: Soft. He exhibits no ascites and no mass. There is no splenomegaly or hepatomegaly. There is tenderness. There is no  rebound.  Musculoskeletal:       Right lower leg: Normal. He exhibits no edema.       Left lower leg: Normal. He exhibits no edema.  Neurological: He is alert and oriented to person, place, and time.  Psychiatric: He has a normal mood and affect.    Data: Lab Results  Component Value Date   WBC 8.6 02/05/2018   HGB 12.7 (L) 02/05/2018   HCT 38.0 (L) 02/05/2018   MCV 87.6 02/05/2018   PLT 274 02/05/2018   Recent Labs  Lab 02/03/18 1334 02/05/18 0838  HGB 12.0* 12.7*   Lab Results  Component Value Date   NA 136 02/05/2018   K 3.0 (L) 02/05/2018   CL 96 (L) 02/05/2018   CO2 31 02/05/2018  BUN 34 (H) 02/05/2018   CREATININE 1.49 (H) 02/05/2018   Lab Results  Component Value Date   ALT 17 02/05/2018   AST 22 02/05/2018   ALKPHOS 69 02/05/2018   BILITOT 0.5 02/05/2018   No results for input(s): APTT, INR, PTT in the last 168 hours. CBC Latest Ref Rng & Units 02/05/2018 02/03/2018 01/09/2018  WBC 3.8 - 10.6 K/uL 8.6 8.9 10.2  Hemoglobin 13.0 - 18.0 g/dL 12.7(L) 12.0(L) 11.1(L)  Hematocrit 40.0 - 52.0 % 38.0(L) 36.0(L) 34.1(L)  Platelets 150 - 440 K/uL 274 328 291    STUDIES: Dg Chest 2 View  Result Date: 02/05/2018 CLINICAL DATA:  83 year old male with severe esophageal pain EXAM: CHEST - 2 VIEW COMPARISON:  Prior chest x-ray 11/20/2016 FINDINGS: Stable cardiac and mediastinal contours. The thoracic aorta is ectatic, tortuous and atherosclerotic. Stable background of pulmonary hyperinflation, bronchitic changes and interstitial prominence. No focal airspace consolidation, pulmonary edema, pleural effusion or pneumothorax. IMPRESSION: Stable chest x-ray without evidence of acute cardiopulmonary process. Electronically Signed   By: Jacqulynn Cadet M.D.   On: 02/05/2018 09:14   Ct Angio Chest/abd/pel For Dissection W And/or Wo Contrast  Result Date: 02/05/2018 CLINICAL DATA:  83 year old male with stage IV prostate cancer and chest pain concerning for possible aortic  dissection. EXAM: CT ANGIOGRAPHY CHEST, ABDOMEN AND PELVIS TECHNIQUE: Multidetector CT imaging through the chest, abdomen and pelvis was performed using the standard protocol during bolus administration of intravenous contrast. Multiplanar reconstructed images and MIPs were obtained and reviewed to evaluate the vascular anatomy. CONTRAST:  20mL OMNIPAQUE IOHEXOL 350 MG/ML SOLN COMPARISON:  Prior CT scan of the abdomen and pelvis 01/09/2018; prior CT scan of the chest 10/21/2017 FINDINGS: CTA CHEST FINDINGS Cardiovascular: No evidence of acute intramural hematoma on the initial noncontrast enhanced images. Following administration of intravenous contrast, there is excellent opacification of the thoracic aorta and its branch vessels. Conventional 3 vessel arch anatomy. The aorta is normal in caliber. No evidence of aneurysm, dissection or penetrating atherosclerotic ulcer. Heterogeneous atherosclerotic plaque is present. The cardiac structures are normal in size. Calcifications are present along the course of the coronary arteries. No pericardial effusion. Unremarkable main and central pulmonary arteries. Mediastinum/Nodes: Unremarkable CT appearance of the thyroid gland. No suspicious mediastinal or hilar adenopathy. No soft tissue mediastinal mass. The thoracic esophagus is unremarkable. Lungs/Pleura: Lungs are clear. Small calcified granuloma affiliated with the minor fissure. No pleural effusion or pneumothorax. Musculoskeletal: Extensive scattered sclerotic foci consistent with known metastatic prostate cancer. No significant interval change compared to prior imaging. Review of the MIP images confirms the above findings. CTA ABDOMEN AND PELVIS FINDINGS VASCULAR Aorta: Scattered heterogeneous atherosclerotic plaque. No evidence of aneurysm, dissection or significant penetrating ulceration. Celiac: Ectasia of the celiac axis with a maximal transverse diameter of 1.2 cm. This is unchanged compared to prior imaging.  SMA: Patent without evidence of aneurysm, dissection, vasculitis or significant stenosis. Renals: Solitary renal arteries bilaterally. There is a focal moderate stenosis at the origin of the right renal artery and a focal moderate to high-grade stenosis at the origin of the left renal artery both secondary to predominantly fibrofatty atherosclerotic plaque. No changes of fibromuscular dysplasia. IMA: Patent without evidence of aneurysm, dissection, vasculitis or significant stenosis. Inflow: Patent without evidence of aneurysm, dissection, vasculitis or significant stenosis. Veins: No obvious venous abnormality within the limitations of this arterial phase study. Review of the MIP images confirms the above findings. NON-VASCULAR Hepatobiliary: Normal hepatic contour and morphology. No discrete hepatic lesions. Normal appearance  of the gallbladder. No intra or extrahepatic biliary ductal dilatation. Pancreas: Unremarkable. No pancreatic ductal dilatation or surrounding inflammatory changes. Spleen: Normal in size without focal abnormality. Adrenals/Urinary Tract: Normal adrenal glands. No evidence of hydronephrosis, nephrolithiasis or enhancing renal mass. The ureters and bladder are unremarkable. Stomach/Bowel: Colonic diverticular disease without CT evidence of active inflammation. No evidence of obstruction or focal bowel wall thickening. Normal appendix in the right lower quadrant. The terminal ileum is unremarkable. Small duodenum diverticulum. Lymphatic: No suspicious lymphadenopathy. Reproductive: Prostate is unremarkable. Other: No abdominal wall hernia or abnormality. No abdominopelvic ascites. Musculoskeletal: Stable appearance of diffuse multifocal sclerotic bony lesions consistent with patient's known history of metastatic prostate cancer. Lumbar degenerative disc disease and facet arthropathy. Review of the MIP images confirms the above findings. IMPRESSION: CTA CHEST 1. No acute aortic vascular  abnormality. 2. Coronary artery calcifications. 3.  Aortic Atherosclerosis (ICD10-170.0) 4. Stable osseous metastatic disease. CTA ABD/PELVIS 1. No acute aortic vascular abnormality. 2. Bilateral renal artery stenosis, moderate on the right and advanced on the left. 3.  Aortic Atherosclerosis (ICD10-170.0) 4. Colonic diverticular disease without CT evidence of active inflammation. 5. Widespread osseous metastatic disease. 6. Lumbar degenerative disc disease and facet arthropathy. Signed, Criselda Peaches, MD [facility] Electronically Signed   By: Jacqulynn Cadet M.D.   On: 02/05/2018 10:56   @IMAGES @  Assessment: 1.  Epigastric pain-differential diagnosis includes peptic ulcer disease, pancreatitis (normal CT and pancreatic enzymes), medication induced esophagitis related to Fosamax therapy, possibly related to pain from metastatic prostate cancer exclusively. 2.  Metastatic prostate cancer on oral chemotherapy.  Recommendations: 1.  We will add oral Carafate slurry in the event this is a moderate to severe esophagitis/gastritis event. 2.  Tentative EGD for tomorrow, patient understands the nature of the planned procedure as well as indications, risks, alternatives and potential complications and wishes to proceed. 3.  Further recommendations to follow.  Thank you for the consult. Please call with questions or concerns.  Olean Ree, "Lanny Hurst MD [facility] [place], [place] [address] [phone]  02/05/2018 2:38 PM

## 2018-02-05 NOTE — ED Notes (Signed)
Returned from XR 

## 2018-02-05 NOTE — ED Notes (Signed)
Pt states he has prostate cancer stage 4, states he takes a Chemo pill by mouth and is supposed to sit up for an hour after taking it, pt states he took it on Thursday and started having the pain in his esophagus through his chest Thursday, pain continues to get worse. Pt frequently moaning in room, family at bedside.

## 2018-02-05 NOTE — Progress Notes (Signed)
Family Meeting Note  Advance Directive: yes  Today a meeting took place with the patient   The following were discussed:Patient's diagnosis: has better metastatic adenocarcinoma. He is being admitted with acute epigastric discomfort after taking Fosamax., Patient's progosis: stable  Discuss code status with patient and family. Patient is requesting DNR.  Time spent during discussion:16 minutes Fritzi Mandes, MD

## 2018-02-05 NOTE — ED Triage Notes (Signed)
Pt here with c/o chest pain since Thursday, starting take a new medication for the cancer and chest pain started. Chest pain is in the epigastric region without radiation.  Pt with stage 4 prostate cancer with mets, is currently taking the chemo pill. Saw Dr Ubaldo Glassing earlier this week,

## 2018-02-05 NOTE — ED Provider Notes (Signed)
[facility] Emergency Department Provider Note   ____________________________________________    I have reviewed the triage vital signs and the nursing notes.   HISTORY  Chief Complaint Epigastric pain    HPI Johnathan Gould is a 83 y.o. male with metastatic prostate cancer who presents with epigastric pain which is moderate to severe started approximately 3 days ago.  It has been waxing and waning.  He reports currently it is severe and feels sharp in nature and extends up his esophagus/into his chest.  Denies shortness of breath.  No pleurisy.  No fevers or chills reported.  Has not taken anything for this.  Has never had this before.  He reports this started after taking a pill which he states is known to cause esophagitis but he does not remember which pill it was  Past Medical History:  Diagnosis Date  . Arthritis   . CAD (coronary artery disease)   . Hypertension   . Prostate cancer ([place])    metastatic   . RAD (reactive airway disease)     Patient Active Problem List   Diagnosis Date Noted  . Goals of care, counseling/discussion 12/10/2016  . Anemia of chronic renal failure 11/22/2016  . Hypotension 11/20/2016  . Dehydration 11/20/2016  . Urinary retention 11/20/2016  . Prostate cancer metastatic to bone ([place]) 11/20/2016  . Prostate cancer ([place])   . Urinary obstruction 10/16/2016  . Acute renal failure (ARF) ([place]) 10/16/2016  . Bladder mass 10/16/2016  . CAD (coronary artery disease) 10/16/2016  . HTN (hypertension) 10/16/2016    Past Surgical History:  Procedure Laterality Date  . CORONARY ANGIOPLASTY WITH STENT PLACEMENT    . IR GENERIC HISTORICAL  10/17/2016   IR NEPHROSTOMY PLACEMENT RIGHT 10/17/2016 [facility]INTER[facility] RAD  . IR GENERIC HISTORICAL  10/17/2016   IR NEPHROSTOMY PLACEMENT LEFT 10/17/2016 [name], MD [facility]  . IR GENERIC HISTORICAL  12/23/2016   IR NEPHROSTOMY EXCHANGE LEFT 12/23/2016 [facility]  .  IR GENERIC HISTORICAL  12/23/2016   IR NEPHROSTOMY EXCHANGE RIGHT 12/23/2016 [facility]  . IR NEPHROSTOMY EXCHANGE LEFT  02/11/2017  . IR NEPHROSTOMY EXCHANGE RIGHT  02/11/2017  . KNEE SURGERY Left 2006   Laparascopy done on left knee, scar tissue taken out  . nephrostomy tubes Bilateral     Prior to Admission medications   Medication Sig Start Date End Date Taking? Authorizing Provider  abiraterone acetate (ZYTIGA) 250 MG tablet Take 4 tablets (1,000 mg total) by mouth daily. Take on an empty stomach 1 hour before or 2 hours after a meal 11/03/17   Sindy Guadeloupe, MD  alendronate (FOSAMAX) 70 MG tablet Take 1 tablet (70 mg total) by mouth once a week. Take with a full glass of water on an empty stomach. 10/07/17   Sindy Guadeloupe, MD  aspirin EC 81 MG tablet Take by mouth.    [provider]  ciprofloxacin (CIPRO) 500 MG tablet Take 1 tablet (500 mg total) by mouth 2 (two) times daily. Patient not taking: Reported on 02/03/2018 01/09/18   Carrie Mew, MD  clotrimazole [facility]) 10 MG troche Take 1 lozenge (10 mg total) by mouth 4 (four) times daily. Until white patches resolved in mouth 05/19/17   Sindy Guadeloupe, MD  fentaNYL (DURAGESIC - DOSED MCG/HR) 12 MCG/HR Place 1 patch (12.5 mcg total) onto the skin every 3 (three) days. 02/03/18   Sindy Guadeloupe, MD  gabapentin (NEURONTIN) 100 MG capsule Take 1 capsule by mouth  3 (three) times daily. 01/06/18   [provider]  hydrochlorothiazide (HYDRODIURIL) 12.5 MG tablet  12/17/16   [provider]  levofloxacin (LEVAQUIN) 750 MG tablet Take 1 tablet by mouth daily. 01/02/18   [provider]  metoprolol (LOPRESSOR) 50 MG tablet Take 50 mg by mouth 2 (two) times daily.  12/13/16   [provider]  metroNIDAZOLE (FLAGYL) 500 MG tablet Take 1 tablet (500 mg total) by mouth 3 (three) times daily. Patient not taking: Reported on 02/03/2018 01/09/18   Carrie Mew, MD  nitroGLYCERIN (NITROSTAT) 0.4 MG SL tablet  Place 0.4 mg under the tongue every 5 (five) minutes x 3 doses as needed for chest pain. 06/23/16   [provider]  ondansetron (ZOFRAN ODT) 4 MG disintegrating tablet Take 1 tablet (4 mg total) by mouth every 8 (eight) hours as needed for nausea or vomiting. Patient not taking: Reported on 02/03/2018 01/09/18   Carrie Mew, MD  ondansetron (ZOFRAN) 4 MG tablet Take 1 tablet (4 mg total) by mouth every 6 (six) hours as needed. for nausea Patient not taking: Reported on 02/03/2018 12/27/17   Sindy Guadeloupe, MD  oxyCODONE (OXY IR/ROXICODONE) 5 MG immediate release tablet Take 3 tablets (15 mg total) by mouth every 6 (six) hours as needed for severe pain. 02/03/18   Earlie Server, MD  polyethylene glycol powder [facility]) powder 2 cap fulls in a full glass of water, two times a day, for 3 days. 01/09/18   Carrie Mew, MD  predniSONE (DELTASONE) 5 MG tablet Take 1 tablet (5 mg total) by mouth 2 (two) times daily with a meal. 11/03/17   Sindy Guadeloupe, MD  sennosides-docusate sodium (SENOKOT-S) 8.6-50 MG tablet Take 2 tablets by mouth 2 (two) times daily.     [provider]  tamsulosin (FLOMAX) 0.4 MG CAPS capsule Take 1 capsule (0.4 mg total) by mouth daily. 09/30/17   Hollice Espy, MD     Allergies Patient has no known allergies.  Family History  Problem Relation Age of Onset  . Hypertension Other   . Lung cancer Brother     Social History Social History   Tobacco Use  . Smoking status: Former Smoker    Packs/day: 1.00    Years: 69.50    Pack years: 69.50    Last attempt to quit: 11/04/2014    Years since quitting: 3.2  . Smokeless tobacco: Never Used  Substance Use Topics  . Alcohol use: No  . Drug use: No    Review of Systems  Constitutional: No fever/chills Eyes: No visual changes.  ENT: No sore throat. Cardiovascular: As above Respiratory: Denies shortness of breath. Gastrointestinal: As above, positive bowel movements Genitourinary: Negative for  dysuria. Musculoskeletal: Negative for back pain. Skin: Negative for rash. Neurological: Negative for headaches    ____________________________________________   PHYSICAL EXAM:  VITAL SIGNS: ED Triage Vitals  Enc Vitals Group     BP 02/05/18 0808 (!) 143/94     Pulse Rate 02/05/18 0808 (!) 116     Resp 02/05/18 0808 20     Temp 02/05/18 0808 98.5 F (36.9 C)     Temp Source 02/05/18 0808 Oral     SpO2 02/05/18 0808 96 %     Weight 02/05/18 0807 86.6 kg (191 lb)     Height 02/05/18 0807 1.727 m (5\' 8" )     Head Circumference --      Peak Flow --      Pain Score 02/05/18 0807  10     Pain Loc --      Pain Edu? --      Excl. in [place]? --     Constitutional: Alert and oriented.  Uncomfortable.   Eyes: Conjunctivae are normal.   Nose: No congestion/rhinnorhea. Mouth/Throat: Mucous membranes are moist.    Cardiovascular: Normal rate, regular rhythm. Grossly normal heart sounds.  Good peripheral circulation. Respiratory: Normal respiratory effort.  No retractions. Gastrointestinal: Tenderness to palpation in the epigastrium.  Mild distention. no CVA tenderness. Genitourinary: deferred Musculoskeletal:  Warm and well perfused Neurologic:  Normal speech and language. No gross focal neurologic deficits are appreciated.  Skin:  Skin is warm, dry and intact. No rash noted. Psychiatric: Mood and affect are normal. Speech and behavior are normal.  ____________________________________________   LABS (all labs ordered are listed, but only abnormal results are displayed)  Labs Reviewed  BASIC METABOLIC PANEL - Abnormal; Notable for the following components:      Result Value   Potassium 3.0 (*)    Chloride 96 (*)    Glucose, Bld 120 (*)    BUN 34 (*)    Creatinine, Ser 1.49 (*)    Calcium 8.8 (*)    GFR calc non Af Amer 43 (*)    GFR calc Af Amer 49 (*)    All other components within normal limits  CBC - Abnormal; Notable for the following components:   RBC 4.34 (*)     Hemoglobin 12.7 (*)    HCT 38.0 (*)    RDW 15.5 (*)    All other components within normal limits  HEPATIC FUNCTION PANEL - Abnormal; Notable for the following components:   Albumin 3.3 (*)    Bilirubin, Direct <0.1 (*)    All other components within normal limits  TROPONIN I  LIPASE, BLOOD   ____________________________________________  EKG  ED ECG REPORT I, Lavonia Drafts, the attending physician, personally viewed and interpreted this ECG.  Date: 02/05/2018  Rate: 116 Rhythm: Sinus tachycardia QRS Axis: normal Intervals: Incomplete right bundle branch block ST/T Wave abnormalities: ST depressions, appear chronic Narrative Interpretation: Changes seen on old EKG  ____________________________________________  RADIOLOGY  Chest x-ray unremarkable CT angiography negative for dissection ____________________________________________   PROCEDURES  Procedure(s) performed: No  Procedures   Critical Care performed:No ____________________________________________   INITIAL IMPRESSION / ASSESSMENT AND PLAN / ED COURSE  Pertinent labs & imaging results that were available during my care of the patient were reviewed by me and considered in my medical decision making (see chart for details).  Patient with a history of metastatic prostate cancer presents with primary complaint of epigastric pain.  Differential includes PUD/gastritis, cholecystitis, pancreatitis, esophagitis.  We will give IV morphine and IV Zofran, await labs and consider imaging.  CT angiography negative for dissection, no improvement after multiple doses of IV morphine and Zofran.  IV Protonix given  Given continued pain will admit to the hospital service for further evaluation and management    ____________________________________________   FINAL CLINICAL IMPRESSION(S) / ED DIAGNOSES  Final diagnoses:  Acute gastritis without hemorrhage, unspecified gastritis type  Chest pain, unspecified type         Note:  This document was prepared using Dragon voice recognition software and may include unintentional dictation errors.    Lavonia Drafts, MD 02/05/18 1151

## 2018-02-05 NOTE — H&P (Signed)
[place] [facility] [place] NAME: Johnathan Gould    MR#:  [id number]  DATE OF BIRTH:  08-10-1935  DATE OF ADMISSION:  02/05/2018  PRIMARY CARE PHYSICIAN: [name], MD   REQUESTING/REFERRING PHYSICIAN Dr. Corky Downs  CHIEF COMPLAINT:   Abdominal epigastric pain since Thursday HISTORY OF PRESENT ILLNESS:  Johnathan Gould  is a 83 y.o. male with a known history of metastatic prostate cancer on oral chemo, coronary artery disease and hypertension comes to the emergency room with significant epigastric pain since Thursday. Patient takes Fosamax every Thursday on empty stomach and this Thursday he started having significant epigastric pain after taking Fosamax. He has not been able to eat much. He had a couple times vomiting at home. He is very uncomfortable had couple rounds of morphine, G.I. cocktail, Zofran. CT abdomen nothing acute. Patient is being admitted with severe acute gastritis likely in the setting of taking Fosamax. Received IV Protonix.   PAST MEDICAL HISTORY:   Past Medical History:  Diagnosis Date  . Arthritis   . CAD (coronary artery disease)   . Hypertension   . Prostate cancer ([place])    metastatic   . RAD (reactive airway disease)     PAST SURGICAL HISTOIRY:   Past Surgical History:  Procedure Laterality Date  . CORONARY ANGIOPLASTY WITH STENT PLACEMENT    . IR GENERIC HISTORICAL  10/17/2016   IR NEPHROSTOMY PLACEMENT RIGHT 10/17/2016 [facility]  . IR GENERIC HISTORICAL  10/17/2016   IR NEPHROSTOMY PLACEMENT LEFT 10/17/2016 [name], MD [facility]  . IR GENERIC HISTORICAL  12/23/2016   IR NEPHROSTOMY EXCHANGE LEFT 12/23/2016 [facility]  . IR GENERIC HISTORICAL  12/23/2016   IR NEPHROSTOMY EXCHANGE RIGHT 12/23/2016 [facility]  . IR NEPHROSTOMY EXCHANGE LEFT  02/11/2017  . IR NEPHROSTOMY EXCHANGE RIGHT  02/11/2017  . KNEE SURGERY Left 2006   Laparascopy done on left knee, scar tissue taken out  .  nephrostomy tubes Bilateral     SOCIAL HISTORY:   Social History   Tobacco Use  . Smoking status: Former Smoker    Packs/day: 1.00    Years: 69.50    Pack years: 69.50    Last attempt to quit: 11/04/2014    Years since quitting: 3.2  . Smokeless tobacco: Never Used  Substance Use Topics  . Alcohol use: No    FAMILY HISTORY:   Family History  Problem Relation Age of Onset  . Hypertension Other   . Lung cancer Brother     DRUG ALLERGIES:  No Known Allergies  REVIEW OF SYSTEMS:  Review of Systems  Constitutional: Negative for chills, fever and weight loss.  HENT: Negative for ear discharge, ear pain and nosebleeds.   Eyes: Negative for blurred vision, pain and discharge.  Respiratory: Negative for sputum production, shortness of breath, wheezing and stridor.   Cardiovascular: Negative for chest pain, palpitations, orthopnea and PND.  Gastrointestinal: Positive for abdominal pain and vomiting. Negative for diarrhea and nausea.  Genitourinary: Negative for frequency and urgency.  Musculoskeletal: Negative for back pain and joint pain.  Neurological: Negative for sensory change, speech change, focal weakness and weakness.  Psychiatric/Behavioral: Negative for depression and hallucinations. The patient is not nervous/anxious.      MEDICATIONS AT HOME:   Prior to Admission medications   Medication Sig Start Date End Date Taking? Authorizing Provider  abiraterone acetate (ZYTIGA) 250 MG tablet Take 4 tablets (1,000 mg total) by mouth daily. Take on an empty  stomach 1 hour before or 2 hours after a meal 11/03/17  Yes Sindy Guadeloupe, MD  alendronate (FOSAMAX) 70 MG tablet Take 1 tablet (70 mg total) by mouth once a week. Take with a full glass of water on an empty stomach. 10/07/17  Yes Sindy Guadeloupe, MD  aspirin EC 81 MG tablet Take 81 mg by mouth daily.    Yes [provider]  fentaNYL (DURAGESIC - DOSED MCG/HR) 12 MCG/HR Place 1 patch (12.5 mcg total) onto the skin  every 3 (three) days. 02/03/18  Yes Sindy Guadeloupe, MD  gabapentin (NEURONTIN) 100 MG capsule Take 1 capsule by mouth 3 (three) times daily. 01/06/18  Yes [provider]  metoprolol (LOPRESSOR) 50 MG tablet Take 50 mg by mouth 2 (two) times daily.  12/13/16  Yes [provider]  predniSONE (DELTASONE) 5 MG tablet Take 1 tablet (5 mg total) by mouth 2 (two) times daily with a meal. 11/03/17  Yes Sindy Guadeloupe, MD  sennosides-docusate sodium (SENOKOT-S) 8.6-50 MG tablet Take 2 tablets by mouth 2 (two) times daily.    Yes [provider]  tamsulosin (FLOMAX) 0.4 MG CAPS capsule Take 1 capsule (0.4 mg total) by mouth daily. 09/30/17  Yes Hollice Espy, MD  nitroGLYCERIN (NITROSTAT) 0.4 MG SL tablet Place 0.4 mg under the tongue every 5 (five) minutes x 3 doses as needed for chest pain. 06/23/16   [provider]  oxyCODONE (OXY IR/ROXICODONE) 5 MG immediate release tablet Take 3 tablets (15 mg total) by mouth every 6 (six) hours as needed for severe pain. 02/03/18   Earlie Server, MD      VITAL SIGNS:  Blood pressure (!) 152/82, pulse (!) 107, temperature 98.5 F (36.9 C), temperature source Oral, resp. rate (!) 23, height 5\' 8"  (1.727 m), weight 86.6 kg (191 lb), SpO2 98 %.  PHYSICAL EXAMINATION:  GENERAL:  83 y.o.-year-old patient lying in the bed with no acute distress.  EYES: Pupils equal, round, reactive to light and accommodation. No scleral icterus. Extraocular muscles intact.  HEENT: Head atraumatic, normocephalic. Oropharynx and nasopharynx clear.  NECK:  Supple, no jugular venous distention. No thyroid enlargement, no tenderness.  LUNGS: Normal breath sounds bilaterally, no wheezing, rales,rhonchi or crepitation. No use of accessory muscles of respiration.  CARDIOVASCULAR: S1, S2 normal. No murmurs, rubs, or gallops.  ABDOMEN: Soft, nontender, nondistended. Bowel sounds present. No organomegaly or mass.  EXTREMITIES: No pedal edema, cyanosis, or clubbing.   NEUROLOGIC: Cranial nerves II through XII are intact. Muscle strength 5/5 in all extremities. Sensation intact. Gait not checked.  PSYCHIATRIC: The patient is alert and oriented x 3.  SKIN: No obvious rash, lesion, or ulcer.   LABORATORY PANEL:   CBC Recent Labs  Lab 02/05/18 0838  WBC 8.6  HGB 12.7*  HCT 38.0*  PLT 274   ------------------------------------------------------------------------------------------------------------------  Chemistries  Recent Labs  Lab 02/05/18 0838  NA 136  K 3.0*  CL 96*  CO2 31  GLUCOSE 120*  BUN 34*  CREATININE 1.49*  CALCIUM 8.8*  AST 22  ALT 17  ALKPHOS 69  BILITOT 0.5   ------------------------------------------------------------------------------------------------------------------  Cardiac Enzymes Recent Labs  Lab 02/05/18 0838  TROPONINI <0.03   ------------------------------------------------------------------------------------------------------------------  RADIOLOGY:  Dg Chest 2 View  Result Date: 02/05/2018 CLINICAL DATA:  83 year old male with severe esophageal pain EXAM: CHEST - 2 VIEW COMPARISON:  Prior chest x-ray 11/20/2016 FINDINGS: Stable cardiac and mediastinal contours. The thoracic aorta is ectatic, tortuous and atherosclerotic. Stable background of  pulmonary hyperinflation, bronchitic changes and interstitial prominence. No focal airspace consolidation, pulmonary edema, pleural effusion or pneumothorax. IMPRESSION: Stable chest x-ray without evidence of acute cardiopulmonary process. Electronically Signed   By: Jacqulynn Cadet M.D.   On: 02/05/2018 09:14   Ct Angio Chest/abd/pel For Dissection W And/or Wo Contrast  Result Date: 02/05/2018 CLINICAL DATA:  82 year old male with stage IV prostate cancer and chest pain concerning for possible aortic dissection. EXAM: CT ANGIOGRAPHY CHEST, ABDOMEN AND PELVIS TECHNIQUE: Multidetector CT imaging through the chest, abdomen and pelvis was performed using the standard  protocol during bolus administration of intravenous contrast. Multiplanar reconstructed images and MIPs were obtained and reviewed to evaluate the vascular anatomy. CONTRAST:  48mL OMNIPAQUE IOHEXOL 350 MG/ML SOLN COMPARISON:  Prior CT scan of the abdomen and pelvis 01/09/2018; prior CT scan of the chest 10/21/2017 FINDINGS: CTA CHEST FINDINGS Cardiovascular: No evidence of acute intramural hematoma on the initial noncontrast enhanced images. Following administration of intravenous contrast, there is excellent opacification of the thoracic aorta and its branch vessels. Conventional 3 vessel arch anatomy. The aorta is normal in caliber. No evidence of aneurysm, dissection or penetrating atherosclerotic ulcer. Heterogeneous atherosclerotic plaque is present. The cardiac structures are normal in size. Calcifications are present along the course of the coronary arteries. No pericardial effusion. Unremarkable main and central pulmonary arteries. Mediastinum/Nodes: Unremarkable CT appearance of the thyroid gland. No suspicious mediastinal or hilar adenopathy. No soft tissue mediastinal mass. The thoracic esophagus is unremarkable. Lungs/Pleura: Lungs are clear. Small calcified granuloma affiliated with the minor fissure. No pleural effusion or pneumothorax. Musculoskeletal: Extensive scattered sclerotic foci consistent with known metastatic prostate cancer. No significant interval change compared to prior imaging. Review of the MIP images confirms the above findings. CTA ABDOMEN AND PELVIS FINDINGS VASCULAR Aorta: Scattered heterogeneous atherosclerotic plaque. No evidence of aneurysm, dissection or significant penetrating ulceration. Celiac: Ectasia of the celiac axis with a maximal transverse diameter of 1.2 cm. This is unchanged compared to prior imaging. SMA: Patent without evidence of aneurysm, dissection, vasculitis or significant stenosis. Renals: Solitary renal arteries bilaterally. There is a focal moderate  stenosis at the origin of the right renal artery and a focal moderate to high-grade stenosis at the origin of the left renal artery both secondary to predominantly fibrofatty atherosclerotic plaque. No changes of fibromuscular dysplasia. IMA: Patent without evidence of aneurysm, dissection, vasculitis or significant stenosis. Inflow: Patent without evidence of aneurysm, dissection, vasculitis or significant stenosis. Veins: No obvious venous abnormality within the limitations of this arterial phase study. Review of the MIP images confirms the above findings. NON-VASCULAR Hepatobiliary: Normal hepatic contour and morphology. No discrete hepatic lesions. Normal appearance of the gallbladder. No intra or extrahepatic biliary ductal dilatation. Pancreas: Unremarkable. No pancreatic ductal dilatation or surrounding inflammatory changes. Spleen: Normal in size without focal abnormality. Adrenals/Urinary Tract: Normal adrenal glands. No evidence of hydronephrosis, nephrolithiasis or enhancing renal mass. The ureters and bladder are unremarkable. Stomach/Bowel: Colonic diverticular disease without CT evidence of active inflammation. No evidence of obstruction or focal bowel wall thickening. Normal appendix in the right lower quadrant. The terminal ileum is unremarkable. Small duodenum diverticulum. Lymphatic: No suspicious lymphadenopathy. Reproductive: Prostate is unremarkable. Other: No abdominal wall hernia or abnormality. No abdominopelvic ascites. Musculoskeletal: Stable appearance of diffuse multifocal sclerotic bony lesions consistent with patient's known history of metastatic prostate cancer. Lumbar degenerative disc disease and facet arthropathy. Review of the MIP images confirms the above findings. IMPRESSION: CTA CHEST 1. No acute aortic vascular abnormality. 2. Coronary artery calcifications.  3.  Aortic Atherosclerosis (ICD10-170.0) 4. Stable osseous metastatic disease. CTA ABD/PELVIS 1. No acute aortic vascular  abnormality. 2. Bilateral renal artery stenosis, moderate on the right and advanced on the left. 3.  Aortic Atherosclerosis (ICD10-170.0) 4. Colonic diverticular disease without CT evidence of active inflammation. 5. Widespread osseous metastatic disease. 6. Lumbar degenerative disc disease and facet arthropathy. Signed, Criselda Peaches, MD [facility] Electronically Signed   By: Jacqulynn Cadet M.D.   On: 02/05/2018 10:56    EKG:    IMPRESSION AND PLAN:   Johnathan Gould  is a 83 y.o. male with a known history of metastatic prostate cancer on oral chemo, coronary artery disease and hypertension comes to the emergency room with significant epigastric pain since Thursday. Patient takes Fosamax every Thursday on empty stomach and this Thursday he started having significant epigastric pain after taking Fosamax  1. epigastric abdominal pain since Thursday likely secondary to gastritis the setting taking Fosamax -admit to medical floor -IV Protonix BID -RN G.I. Cocktail -the RN pain meds -spoke with Dr. Alice Reichert who would see patient in consultation. Will keep patient NPO after midnight in case patient needs to get endoscopy evaluation  2. metastatic prostate cancer -oral chemo drug Zytiga. Family to bring a bottle of oral chemo will send out a pharmacy to dispense  3. Hypertension continue home meds  4. DVT prophylaxis subcu Lovenox  Double was discussed with patient's family   All the records are reviewed and case discussed with ED provider. Management plans discussed with the patient, family and they are in agreement.  CODE STATUS: DNR TOTAL TIME TAKING CARE OF THIS PATIENT 45 minutes.    Fritzi Mandes M.D on 02/05/2018 at 12:57 PM  Between 7am to 6pm - Pager - [phone]  After 6pm go to [email or web address] - password [email or web address] [facility]  [facility]  Office  [phone]  CC: Primary care physician; [name],  MD

## 2018-02-06 ENCOUNTER — Other Ambulatory Visit: Payer: Self-pay

## 2018-02-06 ENCOUNTER — Encounter: Payer: Self-pay | Admitting: Anesthesiology

## 2018-02-06 ENCOUNTER — Observation Stay: Payer: Medicare Other

## 2018-02-06 ENCOUNTER — Encounter
Admission: EM | Disposition: A | Discharge status: Skilled Nursing Facility | Payer: Self-pay | Source: Home / Self Care | Attending: Internal Medicine

## 2018-02-06 DIAGNOSIS — C7951 Secondary malignant neoplasm of bone: Secondary | ICD-10-CM | POA: Diagnosis not present

## 2018-02-06 DIAGNOSIS — M1990 Unspecified osteoarthritis, unspecified site: Secondary | ICD-10-CM | POA: Diagnosis present

## 2018-02-06 DIAGNOSIS — Z7983 Long term (current) use of bisphosphonates: Secondary | ICD-10-CM | POA: Diagnosis not present

## 2018-02-06 DIAGNOSIS — I4510 Unspecified right bundle-branch block: Secondary | ICD-10-CM | POA: Diagnosis present

## 2018-02-06 DIAGNOSIS — E876 Hypokalemia: Secondary | ICD-10-CM | POA: Diagnosis present

## 2018-02-06 DIAGNOSIS — Z8249 Family history of ischemic heart disease and other diseases of the circulatory system: Secondary | ICD-10-CM | POA: Diagnosis not present

## 2018-02-06 DIAGNOSIS — Z66 Do not resuscitate: Secondary | ICD-10-CM | POA: Diagnosis present

## 2018-02-06 DIAGNOSIS — R339 Retention of urine, unspecified: Secondary | ICD-10-CM | POA: Diagnosis not present

## 2018-02-06 DIAGNOSIS — C61 Malignant neoplasm of prostate: Secondary | ICD-10-CM | POA: Diagnosis not present

## 2018-02-06 DIAGNOSIS — Z7952 Long term (current) use of systemic steroids: Secondary | ICD-10-CM | POA: Diagnosis not present

## 2018-02-06 DIAGNOSIS — C779 Secondary and unspecified malignant neoplasm of lymph node, unspecified: Secondary | ICD-10-CM | POA: Diagnosis not present

## 2018-02-06 DIAGNOSIS — T458X5A Adverse effect of other primarily systemic and hematological agents, initial encounter: Secondary | ICD-10-CM | POA: Diagnosis present

## 2018-02-06 DIAGNOSIS — I219 Acute myocardial infarction, unspecified: Secondary | ICD-10-CM | POA: Diagnosis not present

## 2018-02-06 DIAGNOSIS — Z955 Presence of coronary angioplasty implant and graft: Secondary | ICD-10-CM | POA: Diagnosis not present

## 2018-02-06 DIAGNOSIS — Z801 Family history of malignant neoplasm of trachea, bronchus and lung: Secondary | ICD-10-CM | POA: Diagnosis not present

## 2018-02-06 DIAGNOSIS — I2510 Atherosclerotic heart disease of native coronary artery without angina pectoris: Secondary | ICD-10-CM | POA: Diagnosis present

## 2018-02-06 DIAGNOSIS — I214 Non-ST elevation (NSTEMI) myocardial infarction: Principal | ICD-10-CM | POA: Diagnosis present

## 2018-02-06 DIAGNOSIS — R0902 Hypoxemia: Secondary | ICD-10-CM | POA: Diagnosis not present

## 2018-02-06 DIAGNOSIS — N189 Chronic kidney disease, unspecified: Secondary | ICD-10-CM | POA: Diagnosis not present

## 2018-02-06 DIAGNOSIS — I959 Hypotension, unspecified: Secondary | ICD-10-CM | POA: Diagnosis not present

## 2018-02-06 DIAGNOSIS — Z515 Encounter for palliative care: Secondary | ICD-10-CM | POA: Diagnosis not present

## 2018-02-06 DIAGNOSIS — M810 Age-related osteoporosis without current pathological fracture: Secondary | ICD-10-CM | POA: Diagnosis not present

## 2018-02-06 DIAGNOSIS — Z87891 Personal history of nicotine dependence: Secondary | ICD-10-CM | POA: Diagnosis not present

## 2018-02-06 DIAGNOSIS — J9601 Acute respiratory failure with hypoxia: Secondary | ICD-10-CM

## 2018-02-06 DIAGNOSIS — J45909 Unspecified asthma, uncomplicated: Secondary | ICD-10-CM | POA: Diagnosis present

## 2018-02-06 DIAGNOSIS — R1013 Epigastric pain: Secondary | ICD-10-CM | POA: Diagnosis not present

## 2018-02-06 DIAGNOSIS — Z5111 Encounter for antineoplastic chemotherapy: Secondary | ICD-10-CM | POA: Diagnosis present

## 2018-02-06 DIAGNOSIS — K2900 Acute gastritis without bleeding: Secondary | ICD-10-CM | POA: Diagnosis present

## 2018-02-06 DIAGNOSIS — Z7982 Long term (current) use of aspirin: Secondary | ICD-10-CM | POA: Diagnosis not present

## 2018-02-06 DIAGNOSIS — N183 Chronic kidney disease, stage 3 (moderate): Secondary | ICD-10-CM | POA: Diagnosis present

## 2018-02-06 DIAGNOSIS — I129 Hypertensive chronic kidney disease with stage 1 through stage 4 chronic kidney disease, or unspecified chronic kidney disease: Secondary | ICD-10-CM | POA: Diagnosis present

## 2018-02-06 DIAGNOSIS — R079 Chest pain, unspecified: Secondary | ICD-10-CM | POA: Diagnosis present

## 2018-02-06 DIAGNOSIS — M545 Low back pain: Secondary | ICD-10-CM | POA: Diagnosis not present

## 2018-02-06 LAB — LACTIC ACID, PLASMA: Lactic Acid, Venous: 1.1 mmol/L (ref 0.5–1.9)

## 2018-02-06 LAB — MAGNESIUM: Magnesium: 1.5 mg/dL — ABNORMAL LOW (ref 1.7–2.4)

## 2018-02-06 LAB — IRON AND TIBC
IRON: 77 ug/dL (ref 45–182)
SATURATION RATIOS: 24 % (ref 17.9–39.5)
TIBC: 323 ug/dL (ref 250–450)
UIBC: 246 ug/dL

## 2018-02-06 LAB — CBC
HCT: 31.9 % — ABNORMAL LOW (ref 40.0–52.0)
Hemoglobin: 10.5 g/dL — ABNORMAL LOW (ref 13.0–18.0)
MCH: 29.4 pg (ref 26.0–34.0)
MCHC: 32.8 g/dL (ref 32.0–36.0)
MCV: 89.6 fL (ref 80.0–100.0)
PLATELETS: 222 10*3/uL (ref 150–440)
RBC: 3.56 MIL/uL — AB (ref 4.40–5.90)
RDW: 15.4 % — ABNORMAL HIGH (ref 11.5–14.5)
WBC: 7.5 10*3/uL (ref 3.8–10.6)

## 2018-02-06 LAB — PROTIME-INR
INR: 0.93
PROTHROMBIN TIME: 12.4 s (ref 11.4–15.2)

## 2018-02-06 LAB — TROPONIN I
TROPONIN I: 17.46 ng/mL — AB (ref <0.03)
Troponin I: 7.89 ng/mL (ref <0.03)

## 2018-02-06 LAB — VITAMIN B12: Vitamin B-12: 175 pg/mL — ABNORMAL LOW (ref 180–914)

## 2018-02-06 LAB — GLUCOSE, CAPILLARY: GLUCOSE-CAPILLARY: 113 mg/dL — AB (ref 65–99)

## 2018-02-06 LAB — FERRITIN: Ferritin: 44 ng/mL (ref 24–336)

## 2018-02-06 LAB — FOLATE: Folate: 29 ng/mL (ref >5.9)

## 2018-02-06 LAB — APTT: aPTT: 31 seconds (ref 24–36)

## 2018-02-06 SURGERY — ESOPHAGOGASTRODUODENOSCOPY (EGD) WITH PROPOFOL
Anesthesia: Monitor Anesthesia Care

## 2018-02-06 MED ORDER — SODIUM CHLORIDE 0.9 % IV SOLN
0.0000 ug/min | INTRAVENOUS | Status: DC
  Administered 2018-02-06: 20 ug/min via INTRAVENOUS
  Administered 2018-02-07: 30 ug/min via INTRAVENOUS
  Filled 2018-02-06: qty 1
  Filled 2018-02-06 (×2): qty 10

## 2018-02-06 MED ORDER — OXYCODONE HCL 5 MG PO TABS
15.0000 mg | ORAL_TABLET | Freq: Four times a day (QID) | ORAL | Status: DC | PRN
  Administered 2018-02-06 – 2018-02-10 (×4): 15 mg via ORAL
  Filled 2018-02-06 (×4): qty 3

## 2018-02-06 MED ORDER — BUDESONIDE 0.5 MG/2ML IN SUSP
0.5000 mg | Freq: Two times a day (BID) | RESPIRATORY_TRACT | Status: DC
  Administered 2018-02-08 – 2018-02-09 (×4): 0.5 mg via RESPIRATORY_TRACT
  Filled 2018-02-06 (×6): qty 2

## 2018-02-06 MED ORDER — ALUM & MAG HYDROXIDE-SIMETH 200-200-20 MG/5ML PO SUSP
30.0000 mL | Freq: Four times a day (QID) | ORAL | Status: DC | PRN
  Filled 2018-02-06: qty 30

## 2018-02-06 MED ORDER — ASPIRIN 325 MG PO TABS
325.0000 mg | ORAL_TABLET | Freq: Every day | ORAL | Status: DC
  Administered 2018-02-06: 325 mg via ORAL
  Filled 2018-02-06 (×2): qty 1

## 2018-02-06 MED ORDER — MORPHINE SULFATE (PF) 4 MG/ML IV SOLN
4.0000 mg | INTRAVENOUS | Status: DC | PRN
  Administered 2018-02-06 – 2018-02-09 (×7): 4 mg via INTRAVENOUS
  Filled 2018-02-06 (×6): qty 1

## 2018-02-06 MED ORDER — MORPHINE SULFATE (PF) 2 MG/ML IV SOLN
2.0000 mg | INTRAVENOUS | Status: DC | PRN
  Administered 2018-02-06 (×2): 2 mg via INTRAVENOUS
  Filled 2018-02-06 (×2): qty 1

## 2018-02-06 MED ORDER — IPRATROPIUM-ALBUTEROL 0.5-2.5 (3) MG/3ML IN SOLN
3.0000 mL | RESPIRATORY_TRACT | Status: DC
  Administered 2018-02-07 – 2018-02-08 (×9): 3 mL via RESPIRATORY_TRACT
  Filled 2018-02-06 (×10): qty 3

## 2018-02-06 MED ORDER — HEPARIN (PORCINE) IN NACL 100-0.45 UNIT/ML-% IJ SOLN
1050.0000 [IU]/h | INTRAMUSCULAR | Status: DC
  Administered 2018-02-06: 1050 [IU]/h via INTRAVENOUS
  Filled 2018-02-06: qty 250

## 2018-02-06 MED ORDER — POTASSIUM CHLORIDE IN NACL 40-0.9 MEQ/L-% IV SOLN
INTRAVENOUS | Status: DC
Start: 2018-02-06 — End: 2018-02-10
  Administered 2018-02-06 – 2018-02-10 (×6): 75 mL/h via INTRAVENOUS
  Filled 2018-02-06 (×13): qty 1000

## 2018-02-06 MED ORDER — MORPHINE SULFATE (PF) 4 MG/ML IV SOLN
4.0000 mg | INTRAVENOUS | Status: DC | PRN
  Administered 2018-02-06: 4 mg via INTRAVENOUS
  Filled 2018-02-06: qty 1

## 2018-02-06 MED ORDER — PANTOPRAZOLE SODIUM 40 MG IV SOLR
40.0000 mg | Freq: Two times a day (BID) | INTRAVENOUS | Status: DC
  Administered 2018-02-06 – 2018-02-09 (×5): 40 mg via INTRAVENOUS
  Filled 2018-02-06 (×4): qty 40

## 2018-02-06 MED ORDER — HEPARIN BOLUS VIA INFUSION
4000.0000 [IU] | Freq: Once | INTRAVENOUS | Status: AC
  Administered 2018-02-06: 4000 [IU] via INTRAVENOUS
  Filled 2018-02-06: qty 4000

## 2018-02-06 MED ORDER — SODIUM CHLORIDE 0.9 % IV BOLUS
500.0000 mL | Freq: Once | INTRAVENOUS | Status: AC
  Administered 2018-02-06: 500 mL via INTRAVENOUS

## 2018-02-06 MED ORDER — NITROGLYCERIN 2 % TD OINT
1.0000 [in_us] | TOPICAL_OINTMENT | Freq: Four times a day (QID) | TRANSDERMAL | Status: DC
  Filled 2018-02-06: qty 1

## 2018-02-06 NOTE — Progress Notes (Signed)
Rapid response was called on patient when troponin was reported high and patient was hypotensive with hypoxia.  I had seen the patient in his room, he was drowsy but able to respond and answer the questions, he had complain of having chest pain and feeling short of breath.  On exam- He appeared in distress, on non-rebreather mask, . Blood pressure on lower side with tachycardia. Mild crackling on chest auscultation, no leg edema. Patient is lethargic but able to answer simple questions.  Lab results reviewed with elevated troponin. EKG, tachycardia with some ST-T changes. No ST elevation MI.  Assessment and plan  * non-ST elevation MI   Spoke to a cardiologist and intensivist on call   IV heparin drip, nitroglycerin paste as needed.   iV fluid bolus for now and stat chest x-ray.   Non-rebreather mask.    I spoke to patient's son in the room, infant him about critical condition and requirement to move to ICU for further monitoring.    Son agreed with the plan, again confirmed his CODE STATUS with the son and he confirms patient is DO NOT RESUSCITATE.  Critical care time spent 45 minutes.

## 2018-02-06 NOTE — Consult Note (Signed)
Name: Johnathan Gould MRN: [id number] DOB: 1935/04/06     CONSULTATION DATE: 02/05/2018    CHIEF COMPLAINT:  resp distress    HISTORY OF PRESENT ILLNESS:  83 yo WM admitted to ICU for acute resp distress Patient placed on 100% NRB mask Patient is in distress-with abd pain Low BP, unable to provide ROS  Patient was on nitro drip but stopped due to low BP Patient Son at bedside  Patient with multiorgan failure and with NSTEMI Cardiology consulted-on heparin infusion GI consulted but unable to perform endosdsopy     Initially admitted for ABD PAIN  Patient has h/o end STAGE prostate cancer with mets to bone  PAST MEDICAL HISTORY :   has a past medical history of Arthritis, CAD (coronary artery disease), Hypertension, Prostate cancer ([place]), and RAD (reactive airway disease).  has a past surgical history that includes Coronary angioplasty with stent; ir generic historical (10/17/2016); ir generic historical (10/17/2016); Knee surgery (Left, 2006); nephrostomy tubes (Bilateral); ir generic historical (12/23/2016); ir generic historical (12/23/2016); IR NEPHROSTOMY EXCHANGE RIGHT (02/11/2017); and IR NEPHROSTOMY EXCHANGE LEFT (02/11/2017). Prior to Admission medications   Medication Sig Start Date End Date Taking? Authorizing Provider  abiraterone acetate (ZYTIGA) 250 MG tablet Take 4 tablets (1,000 mg total) by mouth daily. Take on an empty stomach 1 hour before or 2 hours after a meal 11/03/17  Yes Sindy Guadeloupe, MD  alendronate (FOSAMAX) 70 MG tablet Take 1 tablet (70 mg total) by mouth once a week. Take with a full glass of water on an empty stomach. 10/07/17  Yes Sindy Guadeloupe, MD  aspirin EC 81 MG tablet Take 81 mg by mouth daily.    Yes [provider]  fentaNYL (DURAGESIC - DOSED MCG/HR) 12 MCG/HR Place 1 patch (12.5 mcg total) onto the skin every 3 (three) days. 02/03/18  Yes Sindy Guadeloupe, MD  gabapentin (NEURONTIN) 100 MG capsule Take 1 capsule by mouth 3 (three)  times daily. 01/06/18  Yes [provider]  metoprolol (LOPRESSOR) 50 MG tablet Take 50 mg by mouth 2 (two) times daily.  12/13/16  Yes [provider]  predniSONE (DELTASONE) 5 MG tablet Take 1 tablet (5 mg total) by mouth 2 (two) times daily with a meal. 11/03/17  Yes Sindy Guadeloupe, MD  sennosides-docusate sodium (SENOKOT-S) 8.6-50 MG tablet Take 2 tablets by mouth 2 (two) times daily.    Yes [provider]  tamsulosin (FLOMAX) 0.4 MG CAPS capsule Take 1 capsule (0.4 mg total) by mouth daily. 09/30/17  Yes Hollice Espy, MD  nitroGLYCERIN (NITROSTAT) 0.4 MG SL tablet Place 0.4 mg under the tongue every 5 (five) minutes x 3 doses as needed for chest pain. 06/23/16   [provider]  oxyCODONE (OXY IR/ROXICODONE) 5 MG immediate release tablet Take 3 tablets (15 mg total) by mouth every 6 (six) hours as needed for severe pain. 02/03/18   Earlie Server, MD   No Known Allergies  FAMILY HISTORY:  family history includes Hypertension in his other; Lung cancer in his brother. SOCIAL HISTORY:  reports that he quit smoking about 3 years ago. He has a 69.50 pack-year smoking history. He has never used smokeless tobacco. He reports that he does not drink alcohol or use drugs.  REVIEW OF SYSTEMS:   Unable to obtain due to critical illness   VITAL SIGNS: Temp:  [98.6 F (37 C)-99.4 F (37.4 C)] 98.6 F (37 C) (04/08 1314) Pulse Rate:  [86-126] 86 (04/08 2000) Resp:  [  16-27] 27 (04/08 2000) BP: (78-100)/(54-65) 78/54 (04/08 2000) SpO2:  [62 %-94 %] 92 % (04/08 2000)  Physical Examination:  GENERAL:critically ill appearing, +resp distress HEAD: Normocephalic, atraumatic.  EYES: Pupils equal, round, reactive to light.  No scleral icterus.  MOUTH: Moist mucosal membrane. NECK: Supple. No JVD.  PULMONARY: +rhonchi, +wheezing CARDIOVASCULAR: S1 and S2. Regular rate and rhythm. No murmurs, rubs, or gallops.  GASTROINTESTINAL: Soft, nontender, -distended. No masses.  Positive bowel sounds. No hepatosplenomegaly.  MUSCULOSKELETAL: No swelling, clubbing, or edema.  NEUROLOGIC: obtunded SKIN:intact,warm,dry    ASSESSMENT / PLAN: 83 yo white male transfer to the ICU for acute respiratory distress and hypotension Patient with end-stage prostate cancer in the setting of acute NSTEMI complicated by hypotension,  renal failure  Overall prognosis is very poor I have discussed this with the patient's son who is at bedside patient is a DNR however they will decide on DNI   I have explained to the son that patient has multiorgan failure and is at a high chance for carciac arrest and death.  1.continue oxygen support 2.vasopressors as needed to keep MAP>65 3.heparin infusion 4.PPI BID    Critical Care Time devoted to patient care services described in this note is 55 minutes.   Overall, patient is critically ill, prognosis is guarded. Son updated and notified at bedside.     Corrin Parker, M.D.  [facility]  Medical Director [place] Director [facility] Cardio-Pulmonary Department

## 2018-02-06 NOTE — Progress Notes (Signed)
[facility] [place] NAME: Johnathan Gould    MR#:  [id number]  DATE OF BIRTH:  12-May-1935  SUBJECTIVE:  CHIEF COMPLAINT:   Chief Complaint  Patient presents with  . Chest Pain   Severe epigastric pain, which is intermittent, 10 out of 10 without radiation. REVIEW OF SYSTEMS:  Review of Systems  Constitutional: Negative for chills, fever and malaise/fatigue.  HENT: Negative for sore throat.   Eyes: Negative for blurred vision and double vision.  Respiratory: Negative for cough, hemoptysis, shortness of breath, wheezing and stridor.   Cardiovascular: Negative for chest pain, palpitations, orthopnea and leg swelling.  Gastrointestinal: Positive for abdominal pain and nausea. Negative for blood in stool, diarrhea, melena and vomiting.  Genitourinary: Negative for dysuria, flank pain and hematuria.  Musculoskeletal: Negative for back pain and joint pain.  Skin: Negative for rash.  Neurological: Negative for dizziness, sensory change, focal weakness, seizures, loss of consciousness, weakness and headaches.  Endo/Heme/Allergies: Negative for polydipsia.  Psychiatric/Behavioral: Negative for depression. The patient is not nervous/anxious.     DRUG ALLERGIES:  No Known Allergies VITALS:  Blood pressure 93/65, pulse (!) 111, temperature 98.6 F (37 C), temperature source Oral, resp. rate 20, height 5\' 8"  (1.727 m), weight 191 lb (86.6 kg), SpO2 (!) 62 %. PHYSICAL EXAMINATION:  Physical Exam  Constitutional: He is oriented to person, place, and time. He appears well-nourished. He appears distressed.  HENT:  Head: Normocephalic.  Mouth/Throat: Oropharynx is clear and moist.  Eyes: Pupils are equal, round, and reactive to light. Conjunctivae and EOM are normal. No scleral icterus.  Neck: Normal range of motion. Neck supple. No JVD present. No tracheal deviation present.  Cardiovascular: Normal rate, regular rhythm and normal heart sounds.  Exam reveals no gallop.  No murmur heard. Pulmonary/Chest: Effort normal and breath sounds normal. No respiratory distress. He has no wheezes. He has no rales.  Abdominal: Soft. Bowel sounds are normal. He exhibits no distension. There is tenderness. There is no rebound.  Musculoskeletal: Normal range of motion. He exhibits no edema or tenderness.  Neurological: He is alert and oriented to person, place, and time. No cranial nerve deficit.  Skin: No rash noted. No erythema.   LABORATORY PANEL:  Male CBC Recent Labs  Lab 02/05/18 0838  WBC 8.6  HGB 12.7*  HCT 38.0*  PLT 274   ------------------------------------------------------------------------------------------------------------------ Chemistries  Recent Labs  Lab 02/05/18 0838  NA 136  K 3.0*  CL 96*  CO2 31  GLUCOSE 120*  BUN 34*  CREATININE 1.49*  CALCIUM 8.8*  AST 22  ALT 17  ALKPHOS 69  BILITOT 0.5   RADIOLOGY:  No results found. ASSESSMENT AND PLAN:    Johnathan Gould  is a 83 y.o. male with a known history of metastatic prostate cancer on oral chemo, coronary artery disease and hypertension comes to the emergency room with significant epigastric pain since Thursday. Patient takes Fosamax every Thursday on empty stomach and this Thursday he started having significant epigastric pain after taking Fosamax  1.   Severe epigastric abdominal pain, likely secondary to gastritis the setting taking Fosamax -IV Protonix BID -RN G.I. Cocktail PRN pain meds EGD tomorrow and cardiology consult for clearance due to abnormal EKG per GI physician.  2. metastatic prostate cancer -oral chemo drug Zytiga.   Oncology consult.  3. Hypertension continue home meds  4.   Hypokalemia.  Given potassium supplement, follow-up level.  CKD stage III.  Stable.  All  the records are reviewed and case discussed with Care Management/Social Worker. Management plans discussed with the patient, his wife and son, and they are in  agreement.  CODE STATUS: DNR  TOTAL TIME TAKING CARE OF THIS PATIENT: 36 minutes.   More than 50% of the time was spent in counseling/coordination of care: YES  POSSIBLE D/C IN 1 DAYS, DEPENDING ON CLINICAL CONDITION.   Demetrios Loll M.D on 02/06/2018 at 2:51 PM  Between 7am to 6pm - Pager - [phone]  After 6pm go to [email or web address] - [facility]

## 2018-02-06 NOTE — Consult Note (Signed)
[facility] Cardiology  CARDIOLOGY CONSULT NOTE  Patient ID: Johnathan Gould MRN: [id number] DOB/AGE: 81-Jul-1936 81 y.o.  Admit date: 02/05/2018 Referring Physician Bridgett Larsson Primary Physician [name]  Primary Cardiologist Fath Reason for Consultation Abnormal ECG  HPI: Patient is a 83 y.o. Male with a past medical history for hypertension, coronary artery disease, and metastatic prostate cancer who presented to the emergency department with a chief complaint of severe epigastric pain and nausea and vomiting.  Patient states that this pain started on Thursday after taking alendronate.  The pain grew in severity and he called EMS on Sunday.  He notes no radiation of the pain, no shortness of breath, and no palpitations.  ECG reveals sinus tach with nonspecific ST changes.  Review of systems complete and found to be negative unless listed above     Past Medical History:  Diagnosis Date  . Arthritis   . CAD (coronary artery disease)   . Hypertension   . Prostate cancer ([place])    metastatic   . RAD (reactive airway disease)     Past Surgical History:  Procedure Laterality Date  . CORONARY ANGIOPLASTY WITH STENT PLACEMENT    . IR GENERIC HISTORICAL  10/17/2016   IR NEPHROSTOMY PLACEMENT RIGHT 10/17/2016 [facility]INTER[facility] RAD  . IR GENERIC HISTORICAL  10/17/2016   IR NEPHROSTOMY PLACEMENT LEFT 10/17/2016 [name], MD [facility]INTER[facility]  . IR GENERIC HISTORICAL  12/23/2016   IR NEPHROSTOMY EXCHANGE LEFT 12/23/2016 [facility]  . IR GENERIC HISTORICAL  12/23/2016   IR NEPHROSTOMY EXCHANGE RIGHT 12/23/2016 [facility]  . IR NEPHROSTOMY EXCHANGE LEFT  02/11/2017  . IR NEPHROSTOMY EXCHANGE RIGHT  02/11/2017  . KNEE SURGERY Left 2006   Laparascopy done on left knee, scar tissue taken out  . nephrostomy tubes Bilateral     Medications Prior to Admission  Medication Sig Dispense Refill Last Dose  . abiraterone acetate (ZYTIGA) 250 MG tablet Take 4 tablets (1,000 mg total) by mouth daily. Take on  an empty stomach 1 hour before or 2 hours after a meal 120 tablet 2 Past Week at Unknown time  . alendronate (FOSAMAX) 70 MG tablet Take 1 tablet (70 mg total) by mouth once a week. Take with a full glass of water on an empty stomach. 4 tablet 6 Past Week at Unknown time  . aspirin EC 81 MG tablet Take 81 mg by mouth daily.    Past Week at Unknown time  . fentaNYL (DURAGESIC - DOSED MCG/HR) 12 MCG/HR Place 1 patch (12.5 mcg total) onto the skin every 3 (three) days. 10 patch 0 Past Week at Unknown time  . gabapentin (NEURONTIN) 100 MG capsule Take 1 capsule by mouth 3 (three) times daily.   Past Week at Unknown time  . metoprolol (LOPRESSOR) 50 MG tablet Take 50 mg by mouth 2 (two) times daily.    Past Week at Unknown time  . predniSONE (DELTASONE) 5 MG tablet Take 1 tablet (5 mg total) by mouth 2 (two) times daily with a meal. 60 tablet 5 Past Week at Unknown time  . sennosides-docusate sodium (SENOKOT-S) 8.6-50 MG tablet Take 2 tablets by mouth 2 (two) times daily.    Past Week at Unknown time  . tamsulosin (FLOMAX) 0.4 MG CAPS capsule Take 1 capsule (0.4 mg total) by mouth daily. 30 capsule 11 Past Week at Unknown time  . nitroGLYCERIN (NITROSTAT) 0.4 MG SL tablet Place 0.4 mg under the tongue every 5 (five) minutes x 3 doses as needed for chest  pain.   PRN at PRN  . oxyCODONE (OXY IR/ROXICODONE) 5 MG immediate release tablet Take 3 tablets (15 mg total) by mouth every 6 (six) hours as needed for severe pain. 48 tablet 0 PRN at PRN   Social History   Socioeconomic History  . Marital status: Married    Spouse name: Not on file  . Number of children: Not on file  . Years of education: Not on file  . Highest education level: Not on file  Occupational History  . Not on file  Social Needs  . Financial resource strain: Not on file  . Food insecurity:    Worry: Not on file    Inability: Not on file  . Transportation needs:    Medical: Not on file    Non-medical: Not on file  Tobacco Use  .  Smoking status: Former Smoker    Packs/day: 1.00    Years: 69.50    Pack years: 69.50    Last attempt to quit: 11/04/2014    Years since quitting: 3.2  . Smokeless tobacco: Never Used  Substance and Sexual Activity  . Alcohol use: No  . Drug use: No  . Sexual activity: Yes  Lifestyle  . Physical activity:    Days per week: Not on file    Minutes per session: Not on file  . Stress: Not on file  Relationships  . Social connections:    Talks on phone: Not on file    Gets together: Not on file    Attends religious service: Not on file    Active member of club or organization: Not on file    Attends meetings of clubs or organizations: Not on file    Relationship status: Not on file  . Intimate partner violence:    Fear of current or ex partner: Not on file    Emotionally abused: Not on file    Physically abused: Not on file    Forced sexual activity: Not on file  Other Topics Concern  . Not on file  Social History Narrative  . Not on file    Family History  Problem Relation Age of Onset  . Hypertension Other   . Lung cancer Brother       Review of systems complete and found to be negative unless listed above      PHYSICAL EXAM  General: Well developed, well nourished, in no acute distress HEENT:  Normocephalic and atramatic Neck:  No JVD.  Lungs: Clear bilaterally to auscultation and percussion. Heart: HRRR . Normal S1 and S2 without gallops or murmurs.  Abdomen: Bowel sounds are positive, abdomen soft and non-tender  Msk:  Back normal, normal gait. Normal strength and tone for age. Extremities: No clubbing, cyanosis or edema.   Neuro: Alert and oriented X 3. Psych:  Good affect, responds appropriately  Labs:   Lab Results  Component Value Date   WBC 8.6 02/05/2018   HGB 12.7 (L) 02/05/2018   HCT 38.0 (L) 02/05/2018   MCV 87.6 02/05/2018   PLT 274 02/05/2018    Recent Labs  Lab 02/05/18 0838  NA 136  K 3.0*  CL 96*  CO2 31  BUN 34*  CREATININE  1.49*  CALCIUM 8.8*  PROT 7.0  BILITOT 0.5  ALKPHOS 69  ALT 17  AST 22  GLUCOSE 120*   Lab Results  Component Value Date   CKTOTAL 44 11/07/2012   CKMB 1.5 11/07/2012   TROPONINI <0.03 02/05/2018    Lab  Results  Component Value Date   CHOL 198 11/06/2012   Lab Results  Component Value Date   HDL 52 11/06/2012   Lab Results  Component Value Date   LDLCALC 134 (H) 11/06/2012   Lab Results  Component Value Date   TRIG 62 11/06/2012   No results found for: CHOLHDL No results found for: LDLDIRECT    Radiology: Dg Chest 2 View  Result Date: 02/05/2018 CLINICAL DATA:  83 year old male with severe esophageal pain EXAM: CHEST - 2 VIEW COMPARISON:  Prior chest x-ray 11/20/2016 FINDINGS: Stable cardiac and mediastinal contours. The thoracic aorta is ectatic, tortuous and atherosclerotic. Stable background of pulmonary hyperinflation, bronchitic changes and interstitial prominence. No focal airspace consolidation, pulmonary edema, pleural effusion or pneumothorax. IMPRESSION: Stable chest x-ray without evidence of acute cardiopulmonary process. Electronically Signed   By: Jacqulynn Cadet M.D.   On: 02/05/2018 09:14   Ct Abdomen Pelvis W Contrast  Result Date: 01/09/2018 CLINICAL DATA:  Abdominal distension. Nausea and vomiting. Low-grade fever. History of metastatic prostate cancer. EXAM: CT ABDOMEN AND PELVIS WITH CONTRAST TECHNIQUE: Multidetector CT imaging of the abdomen and pelvis was performed using the standard protocol following bolus administration of intravenous contrast. CONTRAST:  13mL ISOVUE-300 IOPAMIDOL (ISOVUE-300) INJECTION 61% COMPARISON:  10/21/2017 FINDINGS: Lower chest: Subsegmental atelectasis in the lung bases. No pleural effusion. Coronary artery atherosclerosis. Hepatobiliary: No focal liver abnormality is seen. No gallstones, gallbladder wall thickening, or biliary dilatation. Pancreas: Unremarkable. Spleen: Unremarkable. Adrenals/Urinary Tract: Unremarkable  adrenal glands. Subcentimeter low-density lesions in the upper poles of both kidneys, too small to fully characterize. Slight fullness of the renal collecting systems and ureters with questionable mild urothelial thickening bilaterally. No calculi. Moderately distended bladder without wall thickening. Mild impression on the bladder base by the prostate gland. Stomach/Bowel: Suspected tiny sliding hiatal hernia. Small diverticulum of the third portion of the duodenum without inflammation. No bowel obstruction. Moderate colonic stool burden. Left-sided colonic diverticulosis with very mild stranding adjacent to the proximal to mid sigmoid colon which is new. Unremarkable appendix. Vascular/Lymphatic: Abdominal aortic atherosclerosis without aneurysm. Similar appearance of small periaortic lymph nodes measuring up to 7 mm in short axis. Reproductive: No significant prostatomegaly. Other: No ascites. No abdominal wall hernia. Musculoskeletal: Unchanged widespread sclerotic osseous metastases. No acute osseous abnormality identified. IMPRESSION: 1. Very mild stranding adjacent to the sigmoid colon suspicious for mild acute diverticulitis. 2. Moderate colonic stool burden.  No bowel obstruction. 3. Questionable mild urothelial thickening bilaterally. Correlate for signs of urinary tract infection. 4. Unchanged subcentimeter retroperitoneal lymph nodes and diffuse sclerotic osseous metastases. 5.  Aortic Atherosclerosis (ICD10-I70.0). Electronically Signed   By: Logan Bores M.D.   On: 01/09/2018 13:42   Ct Angio Chest/abd/pel For Dissection W And/or Wo Contrast  Result Date: 02/05/2018 CLINICAL DATA:  83 year old male with stage IV prostate cancer and chest pain concerning for possible aortic dissection. EXAM: CT ANGIOGRAPHY CHEST, ABDOMEN AND PELVIS TECHNIQUE: Multidetector CT imaging through the chest, abdomen and pelvis was performed using the standard protocol during bolus administration of intravenous contrast.  Multiplanar reconstructed images and MIPs were obtained and reviewed to evaluate the vascular anatomy. CONTRAST:  22mL OMNIPAQUE IOHEXOL 350 MG/ML SOLN COMPARISON:  Prior CT scan of the abdomen and pelvis 01/09/2018; prior CT scan of the chest 10/21/2017 FINDINGS: CTA CHEST FINDINGS Cardiovascular: No evidence of acute intramural hematoma on the initial noncontrast enhanced images. Following administration of intravenous contrast, there is excellent opacification of the thoracic aorta and its branch vessels. Conventional 3 vessel arch  anatomy. The aorta is normal in caliber. No evidence of aneurysm, dissection or penetrating atherosclerotic ulcer. Heterogeneous atherosclerotic plaque is present. The cardiac structures are normal in size. Calcifications are present along the course of the coronary arteries. No pericardial effusion. Unremarkable main and central pulmonary arteries. Mediastinum/Nodes: Unremarkable CT appearance of the thyroid gland. No suspicious mediastinal or hilar adenopathy. No soft tissue mediastinal mass. The thoracic esophagus is unremarkable. Lungs/Pleura: Lungs are clear. Small calcified granuloma affiliated with the minor fissure. No pleural effusion or pneumothorax. Musculoskeletal: Extensive scattered sclerotic foci consistent with known metastatic prostate cancer. No significant interval change compared to prior imaging. Review of the MIP images confirms the above findings. CTA ABDOMEN AND PELVIS FINDINGS VASCULAR Aorta: Scattered heterogeneous atherosclerotic plaque. No evidence of aneurysm, dissection or significant penetrating ulceration. Celiac: Ectasia of the celiac axis with a maximal transverse diameter of 1.2 cm. This is unchanged compared to prior imaging. SMA: Patent without evidence of aneurysm, dissection, vasculitis or significant stenosis. Renals: Solitary renal arteries bilaterally. There is a focal moderate stenosis at the origin of the right renal artery and a focal  moderate to high-grade stenosis at the origin of the left renal artery both secondary to predominantly fibrofatty atherosclerotic plaque. No changes of fibromuscular dysplasia. IMA: Patent without evidence of aneurysm, dissection, vasculitis or significant stenosis. Inflow: Patent without evidence of aneurysm, dissection, vasculitis or significant stenosis. Veins: No obvious venous abnormality within the limitations of this arterial phase study. Review of the MIP images confirms the above findings. NON-VASCULAR Hepatobiliary: Normal hepatic contour and morphology. No discrete hepatic lesions. Normal appearance of the gallbladder. No intra or extrahepatic biliary ductal dilatation. Pancreas: Unremarkable. No pancreatic ductal dilatation or surrounding inflammatory changes. Spleen: Normal in size without focal abnormality. Adrenals/Urinary Tract: Normal adrenal glands. No evidence of hydronephrosis, nephrolithiasis or enhancing renal mass. The ureters and bladder are unremarkable. Stomach/Bowel: Colonic diverticular disease without CT evidence of active inflammation. No evidence of obstruction or focal bowel wall thickening. Normal appendix in the right lower quadrant. The terminal ileum is unremarkable. Small duodenum diverticulum. Lymphatic: No suspicious lymphadenopathy. Reproductive: Prostate is unremarkable. Other: No abdominal wall hernia or abnormality. No abdominopelvic ascites. Musculoskeletal: Stable appearance of diffuse multifocal sclerotic bony lesions consistent with patient's known history of metastatic prostate cancer. Lumbar degenerative disc disease and facet arthropathy. Review of the MIP images confirms the above findings. IMPRESSION: CTA CHEST 1. No acute aortic vascular abnormality. 2. Coronary artery calcifications. 3.  Aortic Atherosclerosis (ICD10-170.0) 4. Stable osseous metastatic disease. CTA ABD/PELVIS 1. No acute aortic vascular abnormality. 2. Bilateral renal artery stenosis, moderate on  the right and advanced on the left. 3.  Aortic Atherosclerosis (ICD10-170.0) 4. Colonic diverticular disease without CT evidence of active inflammation. 5. Widespread osseous metastatic disease. 6. Lumbar degenerative disc disease and facet arthropathy. Signed, Criselda Peaches, MD [facility] Radiology Electronically Signed   By: Jacqulynn Cadet M.D.   On: 02/05/2018 10:56    EKG: Sinus tachycardia with nonspecific ST depression   ASSESSMENT AND PLAN:   Patient is a 83 y.o. Male with a past medical history for hypertension, coronary artery disease, and metastatic prostate cancer who presented to the emergency department with a chief complaint of severe epigastric pain, nausea and vomiting. Patient had an abnormal EKG that showed sinus tachycardia with nonspecific ST depression. Troponin was >0.03. Patient should be at low and acceptable risk for upper endoscopy.     Recommendations  1.  Agree with current treatment plan  2.  Defer cardiac diagnostics at this time  3.  Proceed with endoscopy as scheduled    Signed: Isaias Cowman MD,PhD, [facility] 02/06/2018, 4:21 PM

## 2018-02-06 NOTE — Progress Notes (Signed)
Chaplain responded to a Rapid Response. Chaplain practiced mininsty of presence with son and staff. Chaplain prayed silently at the bedside. Chaplain escorted son to ICU waiting room and waited for other family to come. Son of the parient and his wife came. Chaplain escorted sons back and prayed with family and patient. Let family know services are available.   02/06/18 2000  Clinical Encounter Type  Visited With Patient and family together  Visit Type Initial;Spiritual support  Referral From Care management  Spiritual Encounters  Spiritual Needs Prayer;Emotional

## 2018-02-06 NOTE — Progress Notes (Addendum)
eLink Physician-Brief Progress Note Patient Name: Johnathan Gould DOB: May 27, 1935 MRN: [id number]   Date of Service  02/06/2018  HPI/Events of Note  83 yo male with PMH of Prostate CA - stage IV and CAD. admitted with abdominal pain and nausea felt to be d/t gastroenteritis. Now with chest pain and Nonspecific ST changes on EKG. Troponin now 7.8, Cardiology consulted and aware. Now moved to ICU with hypotension and hypoxia. Sat on RA = 62% and now 95% on 100% NRBM. PCCM asked to assume care in ICU.   eICU Interventions  Will order: 1. Portable CXR STAT. 2. D/C Nitroglycerin paste.  3. Phenylephrine IV infusion. Titrate to MAP > 65.  4. Dr. Mortimer Fries notified to evaluate the patient at bedside.      Intervention Category Evaluation Type: New Patient Evaluation  Lysle Dingwall 02/06/2018, 8:16 PM

## 2018-02-06 NOTE — Progress Notes (Signed)
EGD canceled today due to abnormal EKG. Cardiology is consulted for clearance. Patient is seen by cardiology who deemed patient as a low risk candidate for EGD. Continue clear liquid diet, nothing by mouth past midnight, EGD tomorrow  Cephas Darby, MD [address]  [place]  [place], [place] [address]  Main: [phone]  Fax: [phone] Pager: [phone]

## 2018-02-06 NOTE — Progress Notes (Signed)
ANTICOAGULATION CONSULT NOTE - Initial Consult  Pharmacy Consult for heparin drip Indication: chest pain/ACS  No Known Allergies  Patient Measurements: Height: 5\' 8"  (172.7 cm) Weight: 191 lb (86.6 kg) IBW/kg (Calculated) : 68.4 Heparin Dosing Weight: 85 kg  Vital Signs: Temp: 98.6 F (37 C) (04/08 1314) Temp Source: Oral (04/08 1314) BP: 93/65 (04/08 1314) Pulse Rate: 111 (04/08 1314)  Labs: Recent Labs    02/05/18 0838 02/06/18 1758  HGB 12.7*  --   HCT 38.0*  --   PLT 274  --   CREATININE 1.49*  --   TROPONINI <0.03 7.89*    Estimated Creatinine Clearance: 42.3 mL/min (A) (by C-G formula based on SCr of 1.49 mg/dL (H)).   Medical History: Past Medical History:  Diagnosis Date  . Arthritis   . CAD (coronary artery disease)   . Hypertension   . Prostate cancer ([place])    metastatic   . RAD (reactive airway disease)     Assessment: Pharmacy consulted to dose and monitor heparin in this 83 year old male for ACS. Patient is not receiving any anticoagulants PTA and has been on enoxaparin 40 mg subcutaneously daily for DVT ppx (last dose 4/7 ~2300).  Troponin = 7.8 Baseline labs ordered STAT  Goal of Therapy:  Heparin level 0.3-0.7 units/ml Monitor platelets by anticoagulation protocol: Yes   Plan:  Discontinue enoxaparin Give 4000 units bolus x 1 Start heparin infusion at 1050 units/hr Check anti-Xa level in 8 hours and daily while on heparin Continue to monitor H&H and platelets  Lenis Noon, PharmD, BCPS Clinical Pharmacist 02/06/2018,7:13 PM

## 2018-02-06 NOTE — Progress Notes (Signed)
eLink Physician-Brief Progress Note Patient Name: Johnathan Gould DOB: 01-11-1935 MRN: [id number]   Date of Service  02/06/2018  HPI/Events of Note  Called d/t Troponin = 17.46 c/w acute MI. Patient is already on ASA and a Heparin IV infusion. Can't use nitrates or B-Blockade d/t hypotension require Phenylephrine IV infusion for hemodynamic support.   eICU Interventions  Will ask beside nurse to contact cardiology who has already been consulted with the Troponin result to see if they might be interested in cardiac cath lab and possible PCI.      Intervention Category Intermediate Interventions: Diagnostic test evaluation  Lysle Dingwall 02/06/2018, 11:41 PM

## 2018-02-06 NOTE — Progress Notes (Signed)
Hematology/Oncology Consult note [facility]  [phone] [phone]  Patient Care Team: [name], MD as PCP - General (Internal Medicine)   Name of the patient: Johnathan Gould  [id number]  23-Sep-1935   Date of visit: 02/06/18  Diagnosis- 1. 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 Anemia of chronic kidney disease  Chief complaint/ Reason for visit- routine f/u of prostate cancer  Heme/Onc history:patient is a 83 year old male was recently admitted to the hospital on 10/16/2016 with symptoms of left hip pain and renal failure. He was found to have bilateral hydronephrosis and is status post bilateral nephrostomy tube placement. CT abdomen showed an irregular polypoid mass at the posterior aspect of the bladder highly concerning for bladder carcinoma. Extensive bulky adenopathy throughout the abdomen and pelvis consistent with nodal metastases. Widespread osseous metastatic disease. Bone scan showed multifocal areas of increased activity throughout the pelvis, bilateral femurs, rib cage, right scapula, thoracic cervical and lumbar spine consistent with metastatic disease. Patient was found to have enlarged abnormal prostate as well as an elevated PSA of 469 and was presumptively diagnosed with metastatic prostate cancer. He was started on Firmagon by urology. He was also seen by radiation oncology and started palliative radiation to his left hip on 11/04/2016 and finished 10 doses of palliative Rt.  He is currently on lupron through usevery 3 months  4 core biopsy obtained for tissue diagnosis which showed: prostate adenocarcinoma gleasons 9 and 10 in all 4 cores  zytiga started in March 2018. He is on fosamax for his osteoporosis  Interval history- he is tolerating zytiga well. However reports heartburn and symptoms of indigestion due to fosamax. He was also in the ER in march 2019 for symptoms of abdominal pain and was  found to have mild diverticulitis for which he was given oral antibiotics  Continues to have back pain which is not getting controlled with prn oxycodone. He has gained significant weight in the last 3 months. Does not report signifcant constipation  ECOG PS- 1 Pain scale- 10- low back pain Opioid associated constipation- no  Review of systems- Review of Systems  Constitutional: Positive for malaise/fatigue. Negative for chills, fever and weight loss.  HENT: Negative for congestion, ear discharge and nosebleeds.   Eyes: Negative for blurred vision.  Respiratory: Negative for cough, hemoptysis, sputum production, shortness of breath and wheezing.   Cardiovascular: Negative for chest pain, palpitations, orthopnea and claudication.  Gastrointestinal: Positive for heartburn. Negative for abdominal pain, blood in stool, constipation, diarrhea, melena, nausea and vomiting.  Genitourinary: Negative for dysuria, flank pain, frequency, hematuria and urgency.  Musculoskeletal: Positive for back pain and myalgias. Negative for joint pain.  Skin: Negative for rash.  Neurological: Negative for dizziness, tingling, focal weakness, seizures, weakness and headaches.  Endo/Heme/Allergies: Does not bruise/bleed easily.  Psychiatric/Behavioral: Negative for depression and suicidal ideas. The patient does not have insomnia.        No Known Allergies   Past Medical History:  Diagnosis Date  . Arthritis   . CAD (coronary artery disease)   . Hypertension   . Prostate cancer ([place])    metastatic   . RAD (reactive airway disease)      Past Surgical History:  Procedure Laterality Date  . CORONARY ANGIOPLASTY WITH STENT PLACEMENT    . IR GENERIC HISTORICAL  10/17/2016   IR NEPHROSTOMY PLACEMENT RIGHT 10/17/2016 [facility]  . IR GENERIC HISTORICAL  10/17/2016   IR NEPHROSTOMY PLACEMENT LEFT 10/17/2016 [name],  MD [facility]  . IR GENERIC HISTORICAL  12/23/2016   IR NEPHROSTOMY  EXCHANGE LEFT 12/23/2016 [facility]  . IR GENERIC HISTORICAL  12/23/2016   IR NEPHROSTOMY EXCHANGE RIGHT 12/23/2016 [facility]  . IR NEPHROSTOMY EXCHANGE LEFT  02/11/2017  . IR NEPHROSTOMY EXCHANGE RIGHT  02/11/2017  . KNEE SURGERY Left 2006   Laparascopy done on left knee, scar tissue taken out  . nephrostomy tubes Bilateral     Social History   Socioeconomic History  . Marital status: Married    Spouse name: Not on file  . Number of children: Not on file  . Years of education: Not on file  . Highest education level: Not on file  Occupational History  . Not on file  Social Needs  . Financial resource strain: Not on file  . Food insecurity:    Worry: Not on file    Inability: Not on file  . Transportation needs:    Medical: Not on file    Non-medical: Not on file  Tobacco Use  . Smoking status: Former Smoker    Packs/day: 1.00    Years: 69.50    Pack years: 69.50    Last attempt to quit: 11/04/2014    Years since quitting: 3.2  . Smokeless tobacco: Never Used  Substance and Sexual Activity  . Alcohol use: No  . Drug use: No  . Sexual activity: Yes  Lifestyle  . Physical activity:    Days per week: Not on file    Minutes per session: Not on file  . Stress: Not on file  Relationships  . Social connections:    Talks on phone: Not on file    Gets together: Not on file    Attends religious service: Not on file    Active member of club or organization: Not on file    Attends meetings of clubs or organizations: Not on file    Relationship status: Not on file  . Intimate partner violence:    Fear of current or ex partner: Not on file    Emotionally abused: Not on file    Physically abused: Not on file    Forced sexual activity: Not on file  Other Topics Concern  . Not on file  Social History Narrative  . Not on file    Family History  Problem Relation Age of Onset  . Hypertension Other   . Lung cancer Brother     No current facility-administered  medications for this visit.  No current outpatient medications on file.  Facility-Administered Medications Ordered in Other Visits:  .  0.9 % NaCl with KCl 40 mEq / L  infusion, , Intravenous, Continuous, Demetrios Loll, MD, Last Rate: 75 mL/hr at 02/06/18 0746, 75 mL/hr at 02/06/18 0746 .  abiraterone acetate (ZYTIGA) tablet 1,000 mg, 1,000 mg, Oral, Daily, Fritzi Mandes, MD, 1,000 mg at 02/05/18 1516 .  acetaminophen (TYLENOL) tablet 650 mg, 650 mg, Oral, Q6H PRN **OR** acetaminophen (TYLENOL) suppository 650 mg, 650 mg, Rectal, Q6H PRN, Fritzi Mandes, MD .  enoxaparin (LOVENOX) injection 40 mg, 40 mg, Subcutaneous, Q24H, Fritzi Mandes, MD, 40 mg at 02/05/18 2320 .  fentaNYL (DURAGESIC - dosed mcg/hr) 12.5 mcg, 12.5 mcg, Transdermal, Q72H, Fritzi Mandes, MD, 12.5 mcg at 02/05/18 1347 .  gabapentin (NEURONTIN) capsule 100 mg, 100 mg, Oral, TID, Fritzi Mandes, MD, 100 mg at 02/05/18 2321 .  metoprolol tartrate (LOPRESSOR) tablet 50 mg, 50 mg, Oral, BID, Fritzi Mandes, MD .  morphine 2 MG/ML  injection 2 mg, 2 mg, Intravenous, Q4H PRN, Harrie Foreman, MD, 2 mg at 02/06/18 0705 .  ondansetron (ZOFRAN) tablet 4 mg, 4 mg, Oral, Q6H PRN **OR** ondansetron (ZOFRAN) injection 4 mg, 4 mg, Intravenous, Q6H PRN, Fritzi Mandes, MD, 4 mg at 02/06/18 0152 .  oxyCODONE (Oxy IR/ROXICODONE) immediate release tablet 15 mg, 15 mg, Oral, Q6H PRN, Harrie Foreman, MD .  pantoprazole (PROTONIX) injection 40 mg, 40 mg, Intravenous, Q12H, Fritzi Mandes, MD, 40 mg at 02/05/18 2321 .  polyethylene glycol (MIRALAX / GLYCOLAX) packet 17 g, 17 g, Oral, Daily PRN, Fritzi Mandes, MD .  senna (SENOKOT) tablet 8.6 mg, 1 tablet, Oral, BID, Fritzi Mandes, MD, 8.6 mg at 02/05/18 2321 .  senna-docusate (Senokot-S) tablet 2 tablet, 2 tablet, Oral, BID, Fritzi Mandes, MD, 2 tablet at 02/05/18 2321 .  sucralfate (CARAFATE) 1 GM/10ML suspension 1 g, 1 g, Oral, TID WC & HS, Efrain Sella, MD, Stopped at 02/06/18 0745 .  tamsulosin (FLOMAX) capsule 0.4  mg, 0.4 mg, Oral, Daily, Fritzi Mandes, MD, 0.4 mg at 02/05/18 1347  Physical exam:  Vitals:   02/03/18 1345  BP: (!) 144/88  Pulse: 81  Resp: 18  Temp: 98.8 F (37.1 C)  TempSrc: Tympanic  SpO2: 97%  Weight: 191 lb 12.8 oz (87 kg)  Height: 5\' 8"  (1.727 m)   Physical Exam  Constitutional: He is oriented to person, place, and time.  Abdominal obesity  HENT:  Head: Normocephalic and atraumatic.  Mouth/Throat: Oropharynx is clear and moist.  Geographic tongue  Eyes: Pupils are equal, round, and reactive to light. EOM are normal.  Neck: Normal range of motion.  Cardiovascular: Normal rate, regular rhythm and normal heart sounds.  Pulmonary/Chest: Effort normal and breath sounds normal.  Abdominal: Soft. Bowel sounds are normal.  Abdominal obesity. No focal TTP  Musculoskeletal: He exhibits no edema.  Neurological: He is alert and oriented to person, place, and time.  Skin: Skin is warm and dry.     CMP Latest Ref Rng & Units 02/05/2018  Glucose 65 - 99 mg/dL 120(H)  BUN 6 - 20 mg/dL 34(H)  Creatinine 0.61 - 1.24 mg/dL 1.49(H)  Sodium 135 - 145 mmol/L 136  Potassium 3.5 - 5.1 mmol/L 3.0(L)  Chloride 101 - 111 mmol/L 96(L)  CO2 22 - 32 mmol/L 31  Calcium 8.9 - 10.3 mg/dL 8.8(L)  Total Protein 6.5 - 8.1 g/dL 7.0  Total Bilirubin 0.3 - 1.2 mg/dL 0.5  Alkaline Phos 38 - 126 U/L 69  AST 15 - 41 U/L 22  ALT 17 - 63 U/L 17   CBC Latest Ref Rng & Units 02/05/2018  WBC 3.8 - 10.6 K/uL 8.6  Hemoglobin 13.0 - 18.0 g/dL 12.7(L)  Hematocrit 40.0 - 52.0 % 38.0(L)  Platelets 150 - 440 K/uL 274    No images are attached to the encounter.  Dg Chest 2 View  Result Date: 02/05/2018 CLINICAL DATA:  83 year old male with severe esophageal pain EXAM: CHEST - 2 VIEW COMPARISON:  Prior chest x-ray 11/20/2016 FINDINGS: Stable cardiac and mediastinal contours. The thoracic aorta is ectatic, tortuous and atherosclerotic. Stable background of pulmonary hyperinflation, bronchitic changes and  interstitial prominence. No focal airspace consolidation, pulmonary edema, pleural effusion or pneumothorax. IMPRESSION: Stable chest x-ray without evidence of acute cardiopulmonary process. Electronically Signed   By: Jacqulynn Cadet M.D.   On: 02/05/2018 09:14   Ct Abdomen Pelvis W Contrast  Result Date: 01/09/2018 CLINICAL DATA:  Abdominal distension. Nausea and vomiting. Low-grade  fever. History of metastatic prostate cancer. EXAM: CT ABDOMEN AND PELVIS WITH CONTRAST TECHNIQUE: Multidetector CT imaging of the abdomen and pelvis was performed using the standard protocol following bolus administration of intravenous contrast. CONTRAST:  36mL ISOVUE-300 IOPAMIDOL (ISOVUE-300) INJECTION 61% COMPARISON:  10/21/2017 FINDINGS: Lower chest: Subsegmental atelectasis in the lung bases. No pleural effusion. Coronary artery atherosclerosis. Hepatobiliary: No focal liver abnormality is seen. No gallstones, gallbladder wall thickening, or biliary dilatation. Pancreas: Unremarkable. Spleen: Unremarkable. Adrenals/Urinary Tract: Unremarkable adrenal glands. Subcentimeter low-density lesions in the upper poles of both kidneys, too small to fully characterize. Slight fullness of the renal collecting systems and ureters with questionable mild urothelial thickening bilaterally. No calculi. Moderately distended bladder without wall thickening. Mild impression on the bladder base by the prostate gland. Stomach/Bowel: Suspected tiny sliding hiatal hernia. Small diverticulum of the third portion of the duodenum without inflammation. No bowel obstruction. Moderate colonic stool burden. Left-sided colonic diverticulosis with very mild stranding adjacent to the proximal to mid sigmoid colon which is new. Unremarkable appendix. Vascular/Lymphatic: Abdominal aortic atherosclerosis without aneurysm. Similar appearance of small periaortic lymph nodes measuring up to 7 mm in short axis. Reproductive: No significant prostatomegaly. Other:  No ascites. No abdominal wall hernia. Musculoskeletal: Unchanged widespread sclerotic osseous metastases. No acute osseous abnormality identified. IMPRESSION: 1. Very mild stranding adjacent to the sigmoid colon suspicious for mild acute diverticulitis. 2. Moderate colonic stool burden.  No bowel obstruction. 3. Questionable mild urothelial thickening bilaterally. Correlate for signs of urinary tract infection. 4. Unchanged subcentimeter retroperitoneal lymph nodes and diffuse sclerotic osseous metastases. 5.  Aortic Atherosclerosis (ICD10-I70.0). Electronically Signed   By: Logan Bores M.D.   On: 01/09/2018 13:42   Ct Angio Chest/abd/pel For Dissection W And/or Wo Contrast  Result Date: 02/05/2018 CLINICAL DATA:  83 year old male with stage IV prostate cancer and chest pain concerning for possible aortic dissection. EXAM: CT ANGIOGRAPHY CHEST, ABDOMEN AND PELVIS TECHNIQUE: Multidetector CT imaging through the chest, abdomen and pelvis was performed using the standard protocol during bolus administration of intravenous contrast. Multiplanar reconstructed images and MIPs were obtained and reviewed to evaluate the vascular anatomy. CONTRAST:  5mL OMNIPAQUE IOHEXOL 350 MG/ML SOLN COMPARISON:  Prior CT scan of the abdomen and pelvis 01/09/2018; prior CT scan of the chest 10/21/2017 FINDINGS: CTA CHEST FINDINGS Cardiovascular: No evidence of acute intramural hematoma on the initial noncontrast enhanced images. Following administration of intravenous contrast, there is excellent opacification of the thoracic aorta and its branch vessels. Conventional 3 vessel arch anatomy. The aorta is normal in caliber. No evidence of aneurysm, dissection or penetrating atherosclerotic ulcer. Heterogeneous atherosclerotic plaque is present. The cardiac structures are normal in size. Calcifications are present along the course of the coronary arteries. No pericardial effusion. Unremarkable main and central pulmonary arteries.  Mediastinum/Nodes: Unremarkable CT appearance of the thyroid gland. No suspicious mediastinal or hilar adenopathy. No soft tissue mediastinal mass. The thoracic esophagus is unremarkable. Lungs/Pleura: Lungs are clear. Small calcified granuloma affiliated with the minor fissure. No pleural effusion or pneumothorax. Musculoskeletal: Extensive scattered sclerotic foci consistent with known metastatic prostate cancer. No significant interval change compared to prior imaging. Review of the MIP images confirms the above findings. CTA ABDOMEN AND PELVIS FINDINGS VASCULAR Aorta: Scattered heterogeneous atherosclerotic plaque. No evidence of aneurysm, dissection or significant penetrating ulceration. Celiac: Ectasia of the celiac axis with a maximal transverse diameter of 1.2 cm. This is unchanged compared to prior imaging. SMA: Patent without evidence of aneurysm, dissection, vasculitis or significant stenosis. Renals: Solitary renal arteries  bilaterally. There is a focal moderate stenosis at the origin of the right renal artery and a focal moderate to high-grade stenosis at the origin of the left renal artery both secondary to predominantly fibrofatty atherosclerotic plaque. No changes of fibromuscular dysplasia. IMA: Patent without evidence of aneurysm, dissection, vasculitis or significant stenosis. Inflow: Patent without evidence of aneurysm, dissection, vasculitis or significant stenosis. Veins: No obvious venous abnormality within the limitations of this arterial phase study. Review of the MIP images confirms the above findings. NON-VASCULAR Hepatobiliary: Normal hepatic contour and morphology. No discrete hepatic lesions. Normal appearance of the gallbladder. No intra or extrahepatic biliary ductal dilatation. Pancreas: Unremarkable. No pancreatic ductal dilatation or surrounding inflammatory changes. Spleen: Normal in size without focal abnormality. Adrenals/Urinary Tract: Normal adrenal glands. No evidence of  hydronephrosis, nephrolithiasis or enhancing renal mass. The ureters and bladder are unremarkable. Stomach/Bowel: Colonic diverticular disease without CT evidence of active inflammation. No evidence of obstruction or focal bowel wall thickening. Normal appendix in the right lower quadrant. The terminal ileum is unremarkable. Small duodenum diverticulum. Lymphatic: No suspicious lymphadenopathy. Reproductive: Prostate is unremarkable. Other: No abdominal wall hernia or abnormality. No abdominopelvic ascites. Musculoskeletal: Stable appearance of diffuse multifocal sclerotic bony lesions consistent with patient's known history of metastatic prostate cancer. Lumbar degenerative disc disease and facet arthropathy. Review of the MIP images confirms the above findings. IMPRESSION: CTA CHEST 1. No acute aortic vascular abnormality. 2. Coronary artery calcifications. 3.  Aortic Atherosclerosis (ICD10-170.0) 4. Stable osseous metastatic disease. CTA ABD/PELVIS 1. No acute aortic vascular abnormality. 2. Bilateral renal artery stenosis, moderate on the right and advanced on the left. 3.  Aortic Atherosclerosis (ICD10-170.0) 4. Colonic diverticular disease without CT evidence of active inflammation. 5. Widespread osseous metastatic disease. 6. Lumbar degenerative disc disease and facet arthropathy. Signed, Criselda Peaches, MD [facility] Electronically Signed   By: Jacqulynn Cadet M.D.   On: 02/05/2018 10:56     Assessment and plan- Patient is a 83 y.o. male with following medical issues:  1. Castrate sensitive prostate cancer with bone and LN mets: recent CT abdomen done for symptoms of abdominal pain on 01/09/18 showed stable retroperitoneal LN and diffuse bone mets. Given that his low back pain is getting worse, I will plan to get bone scan in the next week or so. PSA 4 months ago was 0.01 which went up to 0.02 last month and is still at 0.02 today.  Continue zytiga and prednisone at this point. I will see him back in 1 months time with cbc, cmp and psa. Lupron today. He gets it Q3 months  2. Neoplasm related pain- will add fentanyl patch 12.5 mcg and increase oxycodone to 15 mg Q6 prn for pain. He will take miralax and senna for his constipation.  3. Reflux esophagitis: I have asked him to stop his fosamax given symptoms of reflux. He will call us sooner if his symptoms worsen. Will plan to give him xgeva Q6 months instead. He does not need it monthly despite bone mets as he has castrate sensitive disease.   4. CKD- kidney functions better today as compared to 1 month ago   Visit Diagnosis 1. Prostate cancer metastatic to bone ([place])   2. Reflux esophagitis   3. Neoplasm related pain      Dr. Randa Evens, MD, MPH [facility] [phone] 02/06/2018 9:05 AM

## 2018-02-06 NOTE — Care Management Obs Status (Signed)
[place] NOTIFICATION   Patient Details  Name: Johnathan Gould MRN: [id number] Date of Birth: 1934/11/17   Medicare Observation Status Notification Given:  No(Admitted obs less than 24 hours)    Beverly Sessions, RN 02/06/2018, 10:29 AM

## 2018-02-06 NOTE — Progress Notes (Signed)
02/06/2018   18:30  Notified by lab that pt's troponin measured at 7.89, up from 0.03 yesterday.  Notified cardiologist Paraschos who ordered heparin gtt, aspirin, cardiac monitoring, and nitroglycerin.  Returned to room to check vitals before beginning new interventions.  BP measured at 83/59, heart rate 136, O2 sat 66 on room air.  Started oxygen via non-rebreather and called rapid response team.  Obtained stat EKG, which showed possible lateral infarct.  CBG measured 113, pt less responsive than before, merely groaning instead of using words.  Rapid response team started heparin and fluid bolus.  Prime doc Anselm Jungling ordered transfer to stepdown.  Pt's son was present and notified.  Traveled with pt to stepdown unit and gave report to receiving RN Pamala Hurry.  Notified Dr. Saralyn Pilar that pt had been transferred.  He ordered no additional interventions at that time.  Dola Argyle, RN

## 2018-02-06 NOTE — Progress Notes (Signed)
Events noted from this evening. Egd will be cancelled for tomorrow due to nSTEMI.   Sherri Sear, MD [facility]

## 2018-02-07 ENCOUNTER — Encounter
Admission: EM | Disposition: A | Discharge status: Skilled Nursing Facility | Payer: Self-pay | Source: Home / Self Care | Attending: Internal Medicine

## 2018-02-07 ENCOUNTER — Other Ambulatory Visit: Payer: Self-pay

## 2018-02-07 DIAGNOSIS — C779 Secondary and unspecified malignant neoplasm of lymph node, unspecified: Secondary | ICD-10-CM

## 2018-02-07 DIAGNOSIS — C7951 Secondary malignant neoplasm of bone: Secondary | ICD-10-CM

## 2018-02-07 DIAGNOSIS — I219 Acute myocardial infarction, unspecified: Secondary | ICD-10-CM

## 2018-02-07 DIAGNOSIS — N189 Chronic kidney disease, unspecified: Secondary | ICD-10-CM

## 2018-02-07 DIAGNOSIS — M545 Low back pain: Secondary | ICD-10-CM

## 2018-02-07 DIAGNOSIS — R1013 Epigastric pain: Secondary | ICD-10-CM

## 2018-02-07 DIAGNOSIS — M810 Age-related osteoporosis without current pathological fracture: Secondary | ICD-10-CM

## 2018-02-07 DIAGNOSIS — Z515 Encounter for palliative care: Secondary | ICD-10-CM

## 2018-02-07 DIAGNOSIS — C61 Malignant neoplasm of prostate: Secondary | ICD-10-CM

## 2018-02-07 HISTORY — PX: LEFT HEART CATH AND CORONARY ANGIOGRAPHY: CATH118249

## 2018-02-07 HISTORY — PX: CORONARY STENT INTERVENTION: CATH118234

## 2018-02-07 LAB — CBC
HCT: 32.2 % — ABNORMAL LOW (ref 40.0–52.0)
HEMOGLOBIN: 10.3 g/dL — AB (ref 13.0–18.0)
MCH: 29 pg (ref 26.0–34.0)
MCHC: 32.1 g/dL (ref 32.0–36.0)
MCV: 90.3 fL (ref 80.0–100.0)
Platelets: 239 10*3/uL (ref 150–440)
RBC: 3.57 MIL/uL — ABNORMAL LOW (ref 4.40–5.90)
RDW: 15.6 % — AB (ref 11.5–14.5)
WBC: 8.3 10*3/uL (ref 3.8–10.6)

## 2018-02-07 LAB — BASIC METABOLIC PANEL
ANION GAP: 7 (ref 5–15)
BUN: 33 mg/dL — AB (ref 6–20)
CALCIUM: 7.5 mg/dL — AB (ref 8.9–10.3)
CO2: 25 mmol/L (ref 22–32)
Chloride: 106 mmol/L (ref 101–111)
Creatinine, Ser: 2.02 mg/dL — ABNORMAL HIGH (ref 0.61–1.24)
GFR calc Af Amer: 34 mL/min — ABNORMAL LOW (ref >60)
GFR, EST NON AFRICAN AMERICAN: 29 mL/min — AB (ref >60)
Glucose, Bld: 121 mg/dL — ABNORMAL HIGH (ref 65–99)
Potassium: 4 mmol/L (ref 3.5–5.1)
Sodium: 138 mmol/L (ref 135–145)

## 2018-02-07 LAB — TROPONIN I
Troponin I: 27.77 ng/mL (ref <0.03)
Troponin I: 37.67 ng/mL (ref <0.03)

## 2018-02-07 LAB — POCT ACTIVATED CLOTTING TIME: ACTIVATED CLOTTING TIME: 301 s

## 2018-02-07 LAB — HEPARIN LEVEL (UNFRACTIONATED): HEPARIN UNFRACTIONATED: 0.46 [IU]/mL (ref 0.30–0.70)

## 2018-02-07 LAB — LACTIC ACID, PLASMA: Lactic Acid, Venous: 0.8 mmol/L (ref 0.5–1.9)

## 2018-02-07 SURGERY — ESOPHAGOGASTRODUODENOSCOPY (EGD) WITH PROPOFOL
Anesthesia: General

## 2018-02-07 SURGERY — EGD (ESOPHAGOGASTRODUODENOSCOPY)
Anesthesia: General

## 2018-02-07 SURGERY — LEFT HEART CATH AND CORONARY ANGIOGRAPHY
Anesthesia: Moderate Sedation

## 2018-02-07 MED ORDER — SODIUM CHLORIDE 0.9 % IV SOLN: 250.0000 mL | INTRAVENOUS | Status: DC | PRN

## 2018-02-07 MED ORDER — SODIUM CHLORIDE 0.9% FLUSH
3.0000 mL | Freq: Two times a day (BID) | INTRAVENOUS | Status: DC
  Administered 2018-02-07 – 2018-02-09 (×4): 3 mL via INTRAVENOUS

## 2018-02-07 MED ORDER — ONDANSETRON HCL 4 MG/2ML IJ SOLN
4.0000 mg | Freq: Four times a day (QID) | INTRAMUSCULAR | Status: DC | PRN
  Administered 2018-02-07 – 2018-02-08 (×3): 4 mg via INTRAVENOUS

## 2018-02-07 MED ORDER — SODIUM CHLORIDE 0.9 % WEIGHT BASED INFUSION: 1.0000 mL/kg/h | INTRAVENOUS | Status: DC

## 2018-02-07 MED ORDER — MORPHINE SULFATE (PF) 4 MG/ML IV SOLN
4.0000 mg | Freq: Once | INTRAVENOUS | Status: AC
  Administered 2018-02-07: 4 mg via INTRAVENOUS

## 2018-02-07 MED ORDER — ACETAMINOPHEN 325 MG PO TABS: 650.0000 mg | ORAL_TABLET | ORAL | Status: DC | PRN

## 2018-02-07 MED ORDER — LABETALOL HCL 5 MG/ML IV SOLN: 10.0000 mg | INTRAVENOUS | Status: AC | PRN

## 2018-02-07 MED ORDER — FENTANYL CITRATE (PF) 100 MCG/2ML IJ SOLN
INTRAMUSCULAR | Status: AC
  Filled 2018-02-07: qty 2

## 2018-02-07 MED ORDER — SODIUM CHLORIDE 0.9 % IV SOLN
INTRAVENOUS | Status: AC | PRN
  Administered 2018-02-07: 1.75 mg/kg/h via INTRAVENOUS
  Administered 2018-02-07: 0.248 mg/kg/h

## 2018-02-07 MED ORDER — ASPIRIN 81 MG PO CHEW
CHEWABLE_TABLET | ORAL | Status: AC
  Filled 2018-02-07: qty 4

## 2018-02-07 MED ORDER — ALPRAZOLAM 0.25 MG PO TABS: 0.2500 mg | ORAL_TABLET | Freq: Three times a day (TID) | ORAL | Status: DC | PRN

## 2018-02-07 MED ORDER — ASPIRIN 81 MG PO CHEW
CHEWABLE_TABLET | ORAL | Status: DC | PRN
  Administered 2018-02-07: 324 mg via ORAL

## 2018-02-07 MED ORDER — HYDRALAZINE HCL 20 MG/ML IJ SOLN: 5.0000 mg | INTRAMUSCULAR | Status: AC | PRN

## 2018-02-07 MED ORDER — SODIUM CHLORIDE 0.9 % WEIGHT BASED INFUSION: 3.0000 mL/kg/h | INTRAVENOUS | Status: DC

## 2018-02-07 MED ORDER — SODIUM CHLORIDE 0.9 % WEIGHT BASED INFUSION
1.0000 mL/kg/h | INTRAVENOUS | Status: AC
  Administered 2018-02-07: 1 mL/kg/h via INTRAVENOUS

## 2018-02-07 MED ORDER — HEPARIN (PORCINE) IN NACL 2-0.9 UNIT/ML-% IJ SOLN
INTRAMUSCULAR | Status: AC
  Filled 2018-02-07: qty 1000

## 2018-02-07 MED ORDER — BIVALIRUDIN TRIFLUOROACETATE 250 MG IV SOLR
INTRAVENOUS | Status: AC
  Filled 2018-02-07: qty 250

## 2018-02-07 MED ORDER — MORPHINE SULFATE (PF) 2 MG/ML IV SOLN
INTRAVENOUS | Status: AC
  Filled 2018-02-07: qty 2

## 2018-02-07 MED ORDER — TICAGRELOR 90 MG PO TABS
ORAL_TABLET | ORAL | Status: DC | PRN
  Administered 2018-02-07: 180 mg via ORAL

## 2018-02-07 MED ORDER — SODIUM CHLORIDE 0.9% FLUSH: 3.0000 mL | INTRAVENOUS | Status: DC | PRN

## 2018-02-07 MED ORDER — ASPIRIN 81 MG PO CHEW
81.0000 mg | CHEWABLE_TABLET | Freq: Every day | ORAL | Status: DC
  Administered 2018-02-08 – 2018-02-10 (×3): 81 mg via ORAL
  Filled 2018-02-07 (×3): qty 1

## 2018-02-07 MED ORDER — TICAGRELOR 90 MG PO TABS
90.0000 mg | ORAL_TABLET | Freq: Two times a day (BID) | ORAL | Status: DC
  Administered 2018-02-07 – 2018-02-10 (×6): 90 mg via ORAL
  Filled 2018-02-07 (×7): qty 1

## 2018-02-07 MED ORDER — BIVALIRUDIN BOLUS VIA INFUSION - CUPID
INTRAVENOUS | Status: DC | PRN
  Administered 2018-02-07: 64.95 mg via INTRAVENOUS

## 2018-02-07 MED ORDER — IOPAMIDOL (ISOVUE-300) INJECTION 61%
INTRAVENOUS | Status: DC | PRN
  Administered 2018-02-07: 325 mL via INTRA_ARTERIAL

## 2018-02-07 MED ORDER — NITROGLYCERIN 5 MG/ML IV SOLN
INTRAVENOUS | Status: AC
  Filled 2018-02-07: qty 10

## 2018-02-07 MED ORDER — SODIUM CHLORIDE 0.9% FLUSH
3.0000 mL | Freq: Two times a day (BID) | INTRAVENOUS | Status: DC
  Administered 2018-02-07: 3 mL via INTRAVENOUS

## 2018-02-07 MED ORDER — ASPIRIN 81 MG PO CHEW: 81.0000 mg | CHEWABLE_TABLET | ORAL | Status: DC

## 2018-02-07 MED ORDER — NITROGLYCERIN 0.4 MG SL SUBL
0.4000 mg | SUBLINGUAL_TABLET | SUBLINGUAL | Status: AC | PRN
  Administered 2018-02-07 (×3): 0.4 mg via SUBLINGUAL
  Filled 2018-02-07 (×3): qty 1

## 2018-02-07 MED ORDER — MIDAZOLAM HCL 2 MG/2ML IJ SOLN
INTRAMUSCULAR | Status: AC
  Filled 2018-02-07: qty 2

## 2018-02-07 MED ORDER — TICAGRELOR 90 MG PO TABS
ORAL_TABLET | ORAL | Status: AC
  Filled 2018-02-07: qty 2

## 2018-02-07 SURGICAL SUPPLY — 17 items
BALLN TREK RX 3.0X12 (BALLOONS) ×3
BALLOON TREK RX 3.0X12 (BALLOONS) ×1 IMPLANT
CATH INFINITI 5FR ANG PIGTAIL (CATHETERS) ×3 IMPLANT
CATH INFINITI 5FR JL4 (CATHETERS) ×3 IMPLANT
CATH INFINITI JR4 5F (CATHETERS) ×3 IMPLANT
CATH VISTA GUIDE 6FR XB3.5 (CATHETERS) ×3 IMPLANT
DEVICE CLOSURE MYNXGRIP 6/7F (Vascular Products) ×3 IMPLANT
DEVICE INFLAT 30 PLUS (MISCELLANEOUS) ×3 IMPLANT
KIT MANI 3VAL PERCEP (MISCELLANEOUS) ×3 IMPLANT
NEEDLE PERC 18GX7CM (NEEDLE) ×3 IMPLANT
PACK CARDIAC CATH (CUSTOM PROCEDURE TRAY) ×3 IMPLANT
SHEATH AVANTI 5FR X 11CM (SHEATH) ×3 IMPLANT
SHEATH AVANTI 6FR X 11CM (SHEATH) ×3 IMPLANT
STENT RESOLUTE ONYX 3.0X15 (Permanent Stent) ×3 IMPLANT
WIRE ASAHI PROWATER 180CM (WIRE) ×6 IMPLANT
WIRE GUIDERIGHT .035X150 (WIRE) ×3 IMPLANT
WIRE RUNTHROUGH .014X300CM (WIRE) ×3 IMPLANT

## 2018-02-07 NOTE — Progress Notes (Signed)
eLink Physician-Brief Progress Note Patient Name: Johnathan Gould DOB: Mar 05, 1935 MRN: [id number]   Date of Service  02/07/2018  HPI/Events of Note  Urinary retention - Bladder scan with > 300 mL residual.   eICU Interventions  Will order: 1. Place Foley catheter.      Intervention Category Major Interventions: Other:  [name] 02/07/2018, 6:01 AM

## 2018-02-07 NOTE — Progress Notes (Signed)
Spoke to Dr. Saralyn Pilar regarding elevated troponin level of 17.46. No new order was given to nurse. Will continue to monitor patient.

## 2018-02-07 NOTE — Progress Notes (Signed)
Nurse paged and talked to Dr. Saralyn Pilar about patient's elevated troponin of 37.67 and symptomatic chest pain. No new order was given by physician at this time. Cardiologist say he will round on patient in the morning. Nurse will give patient SL nitroglycerin and reassess patient's pain after intervention.

## 2018-02-07 NOTE — Consult Note (Signed)
Hematology/Oncology Consult note [facility] [phone] [phone]  Patient Care Team: [name], MD as PCP - General (Internal Medicine)   Name of the patient: Johnathan Gould  [id number]  [birth year]    Reason for consult: metastatic prostate cancer   Requesting physician: Dr. 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  He was found to have bulky adenopathy within the abdomen as well as R she has metastatic disease.  He received palliative radiation to his left hip in January 2018.  He is also been on Lupron and started [place] in March 2018.  His prostate cancer has been under control with Zytiga.  He is also been on weekly Fosamax for his osteoporosis.  He was seen by me on 02/03/2018 when he was complaining of worsening reflux symptoms and was asked to hold his Fosamax at that time.  He presented to the ER 2 days later with symptoms of worsening epigastric and chest pain.  He was evaluated by GI and was supposed to undergo EGD and colonoscopy today.  He also has a prior history of MI and stent placement about 3 years ago.  Since yesterday evening he became more hypotensive short of breath with worsening chest pain and was found to have an elevated troponin of 37 this morning.  He was taken to the Cath Lab and underwent stent placement.  Currently patient is in the ICU.  He continues to complain of epigastric pain.  Unable to obtain detailed review of systems at this point since he has just come out of the Cath Lab  ECOG PS- 2  Pain scale- 8   Review of systems- Review of Systems  Constitutional: Positive for malaise/fatigue. Negative for chills, fever and weight loss.  HENT: Negative for congestion, ear discharge and nosebleeds.   Eyes: Negative for blurred vision.  Respiratory: Negative for cough,  hemoptysis, sputum production, shortness of breath and wheezing.   Cardiovascular: Negative for chest pain, palpitations, orthopnea and claudication.  Gastrointestinal: Positive for abdominal pain (Epigastric pain). Negative for blood in stool, constipation, diarrhea, heartburn, melena, nausea and vomiting.  Genitourinary: Negative for dysuria, flank pain, frequency, hematuria and urgency.  Musculoskeletal: Positive for back pain. Negative for joint pain and myalgias.  Skin: Negative for rash.  Neurological: Negative for dizziness, tingling, focal weakness, seizures, weakness and headaches.  Endo/Heme/Allergies: Does not bruise/bleed easily.  Psychiatric/Behavioral: Negative for depression and suicidal ideas. The patient does not have insomnia.     No Known Allergies  Patient Active Problem List   Diagnosis Date Noted  . NSTEMI (non-ST elevated myocardial infarction) ([place]) 02/06/2018  . Epigastric abdominal pain 02/05/2018  . Goals of care, counseling/discussion 12/10/2016  . Anemia of chronic renal failure 11/22/2016  . Hypotension 11/20/2016  . Dehydration 11/20/2016  . Urinary retention 11/20/2016  . Prostate cancer metastatic to bone ([place]) 11/20/2016  . Prostate cancer ([place])   . Urinary obstruction 10/16/2016  . Acute renal failure (ARF) ([place]) 10/16/2016  . Bladder mass 10/16/2016  . CAD (coronary artery disease) 10/16/2016  . HTN (hypertension) 10/16/2016     Past Medical History:  Diagnosis Date  . Arthritis   . CAD (coronary artery disease)   . Hypertension   . Prostate cancer ([place])    metastatic   . RAD (reactive airway disease)      Past Surgical History:  Procedure Laterality Date  . CORONARY ANGIOPLASTY WITH STENT PLACEMENT    . IR GENERIC HISTORICAL  10/17/2016   IR NEPHROSTOMY PLACEMENT RIGHT 10/17/2016 [facility]  . IR GENERIC HISTORICAL  10/17/2016   IR NEPHROSTOMY PLACEMENT LEFT 10/17/2016 [name], MD [facility]  . IR GENERIC  HISTORICAL  12/23/2016   IR NEPHROSTOMY EXCHANGE LEFT 12/23/2016 [facility]  . IR GENERIC HISTORICAL  12/23/2016   IR NEPHROSTOMY EXCHANGE RIGHT 12/23/2016 [facility]  . IR NEPHROSTOMY EXCHANGE LEFT  02/11/2017  . IR NEPHROSTOMY EXCHANGE RIGHT  02/11/2017  . KNEE SURGERY Left 2006   Laparascopy done on left knee, scar tissue taken out  . nephrostomy tubes Bilateral     Social History   Socioeconomic History  . Marital status: Married    Spouse name: Not on file  . Number of children: Not on file  . Years of education: Not on file  . Highest education level: Not on file  Occupational History  . Not on file  Social Needs  . Financial resource strain: Not on file  . Food insecurity:    Worry: Not on file    Inability: Not on file  . Transportation needs:    Medical: Not on file    Non-medical: Not on file  Tobacco Use  . Smoking status: Former Smoker    Packs/day: 1.00    Years: 69.50    Pack years: 69.50    Last attempt to quit: 11/04/2014    Years since quitting: 3.2  . Smokeless tobacco: Never Used  Substance and Sexual Activity  . Alcohol use: No  . Drug use: No  . Sexual activity: Yes  Lifestyle  . Physical activity:    Days per week: Not on file    Minutes per session: Not on file  . Stress: Not on file  Relationships  . Social connections:    Talks on phone: Not on file    Gets together: Not on file    Attends religious service: Not on file    Active member of club or organization: Not on file    Attends meetings of clubs or organizations: Not on file    Relationship status: Not on file  . Intimate partner violence:    Fear of current or ex partner: Not on file    Emotionally abused: Not on file    Physically abused: Not on file    Forced sexual activity: Not on file  Other Topics Concern  . Not on file  Social History Narrative  . Not on file     Family History  Problem Relation Age of Onset  . Hypertension Other   . Lung cancer Brother       Current Facility-Administered Medications:  .  0.9 %  sodium chloride infusion, 250 mL, Intravenous, PRN, Paraschos, Alexander, MD .  0.9 % NaCl with KCl 40 mEq / L  infusion, , Intravenous, Continuous, Demetrios Loll, MD, Last Rate: 75 mL/hr at 02/07/18 0600 .  0.9% sodium chloride infusion, 1 mL/kg/hr, Intravenous, Continuous, Paraschos, Alexander, MD, Last Rate: 86.6 mL/hr at 02/07/18 1209, 1 mL/kg/hr at 02/07/18 1209 .  abiraterone acetate (ZYTIGA) tablet 1,000 mg, 1,000 mg, Oral, Daily, Fritzi Mandes, MD, Stopped at 02/06/18 [phone] .  acetaminophen (TYLENOL) tablet 650 mg, 650 mg, Oral, Q6H PRN **OR** acetaminophen (TYLENOL) suppository 650 mg, 650 mg, Rectal, Q6H PRN, Fritzi Mandes, MD .  acetaminophen (TYLENOL) tablet 650 mg, 650 mg, Oral, Q4H PRN, Isaias Cowman, MD .  alum & mag hydroxide-simeth (MAALOX/MYLANTA) 200-200-20 MG/5ML suspension 30 mL, 30 mL, Oral, Q6H PRN, Demetrios Loll, MD .  aspirin chewable tablet 81 mg, 81 mg, Oral, Daily, Paraschos, Alexander, MD .  aspirin tablet 325 mg, 325 mg, Oral, Daily, Paraschos, Alexander, MD, 325 mg at 02/06/18 2026 .  budesonide (PULMICORT) nebulizer solution 0.5 mg, 0.5 mg, Nebulization, BID, Kasa, Kurian, MD .  fentaNYL (DURAGESIC - dosed mcg/hr) 12.5 mcg, 12.5 mcg, Transdermal, Q72H, Fritzi Mandes, MD, 12.5 mcg at 02/05/18 1347 .  gabapentin (NEURONTIN) capsule 100 mg, 100 mg, Oral, TID, Fritzi Mandes, MD, 100 mg at 02/06/18 2209 .  heparin ADULT infusion 100 units/mL (25000 units/251mL sodium chloride 0.45%), 1,050 Units/hr, Intravenous, Continuous, Lenis Noon, RPH, Last Rate: 10.5 mL/hr at 02/07/18 0600, 1,050 Units/hr at 02/07/18 0600 .  hydrALAZINE (APRESOLINE) injection 5 mg, 5 mg, Intravenous, Q20 Min PRN, Paraschos, Alexander, MD .  ipratropium-albuterol (DUONEB) 0.5-2.5 (3) MG/3ML nebulizer solution 3 mL, 3 mL, Nebulization, Q4H, Kasa, Kurian, MD, 3 mL at 02/07/18 1135 .  labetalol (NORMODYNE,TRANDATE) injection 10 mg, 10 mg,  Intravenous, Q10 min PRN, Paraschos, Alexander, MD .  morphine 4 MG/ML injection 4 mg, 4 mg, Intravenous, Q4H PRN, Demetrios Loll, MD, 4 mg at 02/07/18 1317 .  ondansetron (ZOFRAN) tablet 4 mg, 4 mg, Oral, Q6H PRN **OR** ondansetron (ZOFRAN) injection 4 mg, 4 mg, Intravenous, Q6H PRN, Fritzi Mandes, MD, 4 mg at 02/07/18 7035 .  ondansetron (ZOFRAN) injection 4 mg, 4 mg, Intravenous, Q6H PRN, Paraschos, Alexander, MD, 4 mg at 02/07/18 1122 .  oxyCODONE (Oxy IR/ROXICODONE) immediate release tablet 15 mg, 15 mg, Oral, Q6H PRN, Harrie Foreman, MD, 15 mg at 02/06/18 1225 .  pantoprazole (PROTONIX) injection 40 mg, 40 mg, Intravenous, Q12H, Fritzi Mandes, MD, Stopped at 02/06/18 [phone] .  pantoprazole (PROTONIX) injection 40 mg, 40 mg, Intravenous, Q12H, Kasa, Kurian, MD, 40 mg at 02/06/18 2209 .  phenylephrine (NEO-SYNEPHRINE) 10 mg in sodium chloride 0.9 % 250 mL (0.04 mg/mL) infusion, 0-400 mcg/min, Intravenous, Titrated, Anders Simmonds, MD, Last Rate: 45 mL/hr at 02/07/18 0616, 30 mcg/min at 02/07/18 0616 .  polyethylene glycol (MIRALAX / GLYCOLAX) packet 17 g, 17 g, Oral, Daily PRN, Fritzi Mandes, MD .  senna-docusate (Senokot-S) tablet 2 tablet, 2 tablet, Oral, BID, Fritzi Mandes, MD, 2 tablet at 02/06/18 2209 .  sodium chloride flush (NS) 0.9 % injection 3 mL, 3 mL, Intravenous, Q12H, Paraschos, Alexander, MD .  sodium chloride flush (NS) 0.9 % injection 3 mL, 3 mL, Intravenous, PRN, Paraschos, Alexander, MD .  sucralfate (CARAFATE) 1 GM/10ML suspension 1 g, 1 g, Oral, TID WC & HS, Efrain Sella, MD, 1 g at 02/06/18 2208 .  tamsulosin (FLOMAX) capsule 0.4 mg, 0.4 mg, Oral, Daily, Fritzi Mandes, MD, Stopped at 02/06/18 [phone] .  ticagrelor (BRILINTA) tablet 90 mg, 90 mg, Oral, BID, Paraschos, Alexander, MD   Physical exam:  Vitals:   02/07/18 1030 02/07/18 1100 02/07/18 1200 02/07/18 1300  BP: 95/71 111/71 97/73 99/68   Pulse: (!) 117 (!) 115 (!) 113 (!) 108  Resp: 20 20 (!) 23 17  Temp:   98.5 F  (36.9 C)   TempSrc:   Oral   SpO2: 90% 96% 96% 97%  Weight:      Height:       Patient currently appears fatigued and in distress from epigastric pain He is alert awake and oriented x3. Respiratory system: Effort is normal and chest clear to auscultation CVS: tachycardic no murmurs rubs or  gallops Abdomen- obese. Non tender Ext- no edema    CMP Latest Ref Rng & Units 02/07/2018  Glucose 65 - 99 mg/dL 121(H)  BUN 6 - 20 mg/dL 33(H)  Creatinine 0.61 - 1.24 mg/dL 2.02(H)  Sodium 135 - 145 mmol/L 138  Potassium 3.5 - 5.1 mmol/L 4.0  Chloride 101 - 111 mmol/L 106  CO2 22 - 32 mmol/L 25  Calcium 8.9 - 10.3 mg/dL 7.5(L)  Total Protein 6.5 - 8.1 g/dL -  Total Bilirubin 0.3 - 1.2 mg/dL -  Alkaline Phos 38 - 126 U/L -  AST 15 - 41 U/L -  ALT 17 - 63 U/L -   CBC Latest Ref Rng & Units 02/07/2018  WBC 3.8 - 10.6 K/uL 8.3  Hemoglobin 13.0 - 18.0 g/dL 10.3(L)  Hematocrit 40.0 - 52.0 % 32.2(L)  Platelets 150 - 440 K/uL 239    @IMAGES @  Dg Chest 1 View  Result Date: 02/06/2018 CLINICAL DATA:  Hypoxia EXAM: CHEST  1 VIEW COMPARISON:  CT 02/05/2018, radiograph 02/05/2018 FINDINGS: Patient rotation exaggerates the cardiomediastinal silhouette. Streaky atelectasis at the left base. No pleural effusion. Aortic atherosclerosis. No pneumothorax. IMPRESSION: Streaky atelectasis at the left base. Exaggerated cardiomediastinal silhouette secondary to patient rotation. Electronically Signed   By: Donavan Foil M.D.   On: 02/06/2018 20:31   Dg Chest 2 View  Result Date: 02/05/2018 CLINICAL DATA:  83 year old male with severe esophageal pain EXAM: CHEST - 2 VIEW COMPARISON:  Prior chest x-ray 11/20/2016 FINDINGS: Stable cardiac and mediastinal contours. The thoracic aorta is ectatic, tortuous and atherosclerotic. Stable background of pulmonary hyperinflation, bronchitic changes and interstitial prominence. No focal airspace consolidation, pulmonary edema, pleural effusion or pneumothorax. IMPRESSION:  Stable chest x-ray without evidence of acute cardiopulmonary process. Electronically Signed   By: Jacqulynn Cadet M.D.   On: 02/05/2018 09:14   Ct Abdomen Pelvis W Contrast  Result Date: 01/09/2018 CLINICAL DATA:  Abdominal distension. Nausea and vomiting. Low-grade fever. History of metastatic prostate cancer. EXAM: CT ABDOMEN AND PELVIS WITH CONTRAST TECHNIQUE: Multidetector CT imaging of the abdomen and pelvis was performed using the standard protocol following bolus administration of intravenous contrast. CONTRAST:  68mL ISOVUE-300 IOPAMIDOL (ISOVUE-300) INJECTION 61% COMPARISON:  10/21/2017 FINDINGS: Lower chest: Subsegmental atelectasis in the lung bases. No pleural effusion. Coronary artery atherosclerosis. Hepatobiliary: No focal liver abnormality is seen. No gallstones, gallbladder wall thickening, or biliary dilatation. Pancreas: Unremarkable. Spleen: Unremarkable. Adrenals/Urinary Tract: Unremarkable adrenal glands. Subcentimeter low-density lesions in the upper poles of both kidneys, too small to fully characterize. Slight fullness of the renal collecting systems and ureters with questionable mild urothelial thickening bilaterally. No calculi. Moderately distended bladder without wall thickening. Mild impression on the bladder base by the prostate gland. Stomach/Bowel: Suspected tiny sliding hiatal hernia. Small diverticulum of the third portion of the duodenum without inflammation. No bowel obstruction. Moderate colonic stool burden. Left-sided colonic diverticulosis with very mild stranding adjacent to the proximal to mid sigmoid colon which is new. Unremarkable appendix. Vascular/Lymphatic: Abdominal aortic atherosclerosis without aneurysm. Similar appearance of small periaortic lymph nodes measuring up to 7 mm in short axis. Reproductive: No significant prostatomegaly. Other: No ascites. No abdominal wall hernia. Musculoskeletal: Unchanged widespread sclerotic osseous metastases. No acute  osseous abnormality identified. IMPRESSION: 1. Very mild stranding adjacent to the sigmoid colon suspicious for mild acute diverticulitis. 2. Moderate colonic stool burden.  No bowel obstruction. 3. Questionable mild urothelial thickening bilaterally. Correlate for signs of urinary tract infection. 4. Unchanged subcentimeter retroperitoneal lymph nodes and diffuse sclerotic  osseous metastases. 5.  Aortic Atherosclerosis (ICD10-I70.0). Electronically Signed   By: Logan Bores M.D.   On: 01/09/2018 13:42   Ct Angio Chest/abd/pel For Dissection W And/or Wo Contrast  Result Date: 02/05/2018 CLINICAL DATA:  83 year old male with stage IV prostate cancer and chest pain concerning for possible aortic dissection. EXAM: CT ANGIOGRAPHY CHEST, ABDOMEN AND PELVIS TECHNIQUE: Multidetector CT imaging through the chest, abdomen and pelvis was performed using the standard protocol during bolus administration of intravenous contrast. Multiplanar reconstructed images and MIPs were obtained and reviewed to evaluate the vascular anatomy. CONTRAST:  66mL OMNIPAQUE IOHEXOL 350 MG/ML SOLN COMPARISON:  Prior CT scan of the abdomen and pelvis 01/09/2018; prior CT scan of the chest 10/21/2017 FINDINGS: CTA CHEST FINDINGS Cardiovascular: No evidence of acute intramural hematoma on the initial noncontrast enhanced images. Following administration of intravenous contrast, there is excellent opacification of the thoracic aorta and its branch vessels. Conventional 3 vessel arch anatomy. The aorta is normal in caliber. No evidence of aneurysm, dissection or penetrating atherosclerotic ulcer. Heterogeneous atherosclerotic plaque is present. The cardiac structures are normal in size. Calcifications are present along the course of the coronary arteries. No pericardial effusion. Unremarkable main and central pulmonary arteries. Mediastinum/Nodes: Unremarkable CT appearance of the thyroid gland. No suspicious mediastinal or hilar adenopathy. No soft  tissue mediastinal mass. The thoracic esophagus is unremarkable. Lungs/Pleura: Lungs are clear. Small calcified granuloma affiliated with the minor fissure. No pleural effusion or pneumothorax. Musculoskeletal: Extensive scattered sclerotic foci consistent with known metastatic prostate cancer. No significant interval change compared to prior imaging. Review of the MIP images confirms the above findings. CTA ABDOMEN AND PELVIS FINDINGS VASCULAR Aorta: Scattered heterogeneous atherosclerotic plaque. No evidence of aneurysm, dissection or significant penetrating ulceration. Celiac: Ectasia of the celiac axis with a maximal transverse diameter of 1.2 cm. This is unchanged compared to prior imaging. SMA: Patent without evidence of aneurysm, dissection, vasculitis or significant stenosis. Renals: Solitary renal arteries bilaterally. There is a focal moderate stenosis at the origin of the right renal artery and a focal moderate to high-grade stenosis at the origin of the left renal artery both secondary to predominantly fibrofatty atherosclerotic plaque. No changes of fibromuscular dysplasia. IMA: Patent without evidence of aneurysm, dissection, vasculitis or significant stenosis. Inflow: Patent without evidence of aneurysm, dissection, vasculitis or significant stenosis. Veins: No obvious venous abnormality within the limitations of this arterial phase study. Review of the MIP images confirms the above findings. NON-VASCULAR Hepatobiliary: Normal hepatic contour and morphology. No discrete hepatic lesions. Normal appearance of the gallbladder. No intra or extrahepatic biliary ductal dilatation. Pancreas: Unremarkable. No pancreatic ductal dilatation or surrounding inflammatory changes. Spleen: Normal in size without focal abnormality. Adrenals/Urinary Tract: Normal adrenal glands. No evidence of hydronephrosis, nephrolithiasis or enhancing renal mass. The ureters and bladder are unremarkable. Stomach/Bowel: Colonic  diverticular disease without CT evidence of active inflammation. No evidence of obstruction or focal bowel wall thickening. Normal appendix in the right lower quadrant. The terminal ileum is unremarkable. Small duodenum diverticulum. Lymphatic: No suspicious lymphadenopathy. Reproductive: Prostate is unremarkable. Other: No abdominal wall hernia or abnormality. No abdominopelvic ascites. Musculoskeletal: Stable appearance of diffuse multifocal sclerotic bony lesions consistent with patient's known history of metastatic prostate cancer. Lumbar degenerative disc disease and facet arthropathy. Review of the MIP images confirms the above findings. IMPRESSION: CTA CHEST 1. No acute aortic vascular abnormality. 2. Coronary artery calcifications. 3.  Aortic Atherosclerosis (ICD10-170.0) 4. Stable osseous metastatic disease. CTA ABD/PELVIS 1. No acute aortic vascular abnormality. 2. Bilateral  renal artery stenosis, moderate on the right and advanced on the left. 3.  Aortic Atherosclerosis (ICD10-170.0) 4. Colonic diverticular disease without CT evidence of active inflammation. 5. Widespread osseous metastatic disease. 6. Lumbar degenerative disc disease and facet arthropathy. Signed, Criselda Peaches, MD [facility] Electronically Signed   By: Jacqulynn Cadet M.D.   On: 02/05/2018 10:56    Assessment and plan- Patient is a 83 y.o. 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  Patient is currently in the ICU after heart catheterization awaiting to see formal report.Marland Kitchen He is currently on Brilinta.  Endoscopy will likely be on hold until acute cardiac issues resolved  2. From prostate cancer standpoint I will hold his Zytiga at this time.  Continue fentanyl patch 12.5 mcg which was started for his worsening back pain.  He is also getting as needed  morphine.  3. Reflux symptoms: probably due to foasmax which has been discontinued. He will need endoscopy when stable from cardiac standpoint.he is on IV pantoprazole.  4. CKD- he does have some baseline CKD which needs to be monitored closely in the setting of cardiac cath  I will continue to follow him while he is inpatient   Visit Diagnosis 1. Acute gastritis without hemorrhage, unspecified gastritis type   2. Chest pain, unspecified type   3. Hypoxia     Dr. Randa Evens, MD, MPH [facility] at [facility] [phone] 02/07/2018  1:32 PM

## 2018-02-07 NOTE — Progress Notes (Signed)
ANTICOAGULATION CONSULT NOTE - Initial Consult  Pharmacy Consult for heparin drip Indication: chest pain/ACS  No Known Allergies  Patient Measurements: Height: 5\' 8"  (172.7 cm) Weight: 191 lb (86.6 kg) IBW/kg (Calculated) : 68.4 Heparin Dosing Weight: 85 kg  Vital Signs: Temp: 99.6 F (37.6 C) (04/09 0200) Temp Source: Axillary (04/09 0200) BP: 96/66 (04/09 0500) Pulse Rate: 124 (04/09 0500)  Labs: Recent Labs    02/05/18 0838  02/06/18 1944 02/06/18 2333 02/07/18 0018 02/07/18 0419  HGB 12.7*  --  10.5*  --   --  10.3*  HCT 38.0*  --  31.9*  --   --  32.2*  PLT 274  --  222  --   --  239  APTT  --   --  31  --   --   --   LABPROT  --   --  12.4  --   --   --   INR  --   --  0.93  --   --   --   HEPARINUNFRC  --   --   --   --   --  0.46  CREATININE 1.49*  --   --   --   --  2.02*  TROPONINI <0.03   < >  --  17.46* 27.77* 37.67*   < > = values in this interval not displayed.    Estimated Creatinine Clearance: 31.2 mL/min (A) (by C-G formula based on SCr of 2.02 mg/dL (H)).   Medical History: Past Medical History:  Diagnosis Date  . Arthritis   . CAD (coronary artery disease)   . Hypertension   . Prostate cancer ([place])    metastatic   . RAD (reactive airway disease)     Assessment: Pharmacy consulted to dose and monitor heparin in this 83 year old male for ACS. Patient is not receiving any anticoagulants PTA and has been on enoxaparin 40 mg subcutaneously daily for DVT ppx (last dose 4/7 ~2300).  Troponin = 7.8 Baseline labs ordered STAT  Goal of Therapy:  Heparin level 0.3-0.7 units/ml Monitor platelets by anticoagulation protocol: Yes   Plan:  Discontinue enoxaparin Give 4000 units bolus x 1 Start heparin infusion at 1050 units/hr Check anti-Xa level in 8 hours and daily while on heparin Continue to monitor H&H and platelets   04/09 @ 0400 HL 0.46 therapeutic. Will continue current rate and will recheck HL @ 1200, CBC stable.  Tobie Lords,  PharmD, BCPS Clinical Pharmacist 02/07/2018,5:27 AM

## 2018-02-07 NOTE — Consult Note (Signed)
Consultation Note Date: 02/07/2018   Patient Name: Johnathan Gould  DOB: Jan 08, 1935  MRN: [id number]  Age / Sex: 83 y.o., male  PCP: [name], MD Referring Physician: Vaughan Basta, *  Reason for Consultation: Establishing goals of care and Psychosocial/spiritual support  HPI/Patient Profile: 83 y.o. male   admitted on 02/05/2018 with  a known history of metastatic prostate cancer on oral chemo, coronary artery disease and hypertension comes to the emergency room with significant epigastric pain     CT abdomen/ nothing acute. Patient  admitted with severe acute gastritis.  Cardiology workup: Abnormal EKG that showed sinus tachycardia with nonspecific ST depression, troponin was greater than 0.03.  The patient developed non-STEMI, treated with heparin drip and got cardiac cath   Patient is open to all offered and available medical interventions to prolong life.  Hopeful for improvement.  Under the care of Dr Rao/oncology/ Fabio Asa on hold   Patient and family face decisions related to living with serious life limiting illness   Clinical Assessment and Goals of Care:  This NP Wadie Lessen reviewed medical records, received report from team, assessed the patient and then meet at the patient's bedside along with his wife  to discuss diagnosis, prognosis, GOC, EOL wishes disposition and options.  Patient's wife initially verbalize no interest in talking with the palliative medicine team however she did agree to allow me a few minutes to introduce the concept of palliative medicine into a holistic treatment plan.  We did discuss the difference between palliative medicine and hospice care.  Values and goals of care important to patient and family were attempted to be elicited.  PMT will continue to shadow this patient for needs.  Primary team encouraged to call us again as needs arise.  Patient's  wife is encouraged to call the team phone with questions or concerns.   SUMMARY OF RECOMMENDATIONS    Code Status/Advance Care Planning: DNR- previously documented   Additional Recommendations (Limitations, Scope, Preferences):  Full Scope Treatment  Additional Recommendations: Education on Hospice     Primary Diagnoses: Present on Admission: . Epigastric abdominal pain . NSTEMI (non-ST elevated myocardial infarction) ([place])   I have reviewed the medical record, interviewed the patient and family, and examined the patient. The following aspects are pertinent.  Past Medical History:  Diagnosis Date  . Arthritis   . CAD (coronary artery disease)   . Hypertension   . Prostate cancer ([place])    metastatic   . RAD (reactive airway disease)    Social History   Socioeconomic History  . Marital status: Married    Spouse name: Not on file  . Number of children: Not on file  . Years of education: Not on file  . Highest education level: Not on file  Occupational History  . Not on file  Social Needs  . Financial resource strain: Not on file  . Food insecurity:    Worry: Not on file    Inability: Not on file  . Transportation needs:  Medical: Not on file    Non-medical: Not on file  Tobacco Use  . Smoking status: Former Smoker    Packs/day: 1.00    Years: 69.50    Pack years: 69.50    Last attempt to quit: 11/04/2014    Years since quitting: 3.2  . Smokeless tobacco: Never Used  Substance and Sexual Activity  . Alcohol use: No  . Drug use: No  . Sexual activity: Yes  Lifestyle  . Physical activity:    Days per week: Not on file    Minutes per session: Not on file  . Stress: Not on file  Relationships  . Social connections:    Talks on phone: Not on file    Gets together: Not on file    Attends religious service: Not on file    Active member of club or organization: Not on file    Attends meetings of clubs or organizations: Not on file    Relationship  status: Not on file  Other Topics Concern  . Not on file  Social History Narrative  . Not on file   Family History  Problem Relation Age of Onset  . Hypertension Other   . Lung cancer Brother    Scheduled Meds: . aspirin  81 mg Oral Daily  . aspirin  325 mg Oral Daily  . budesonide (PULMICORT) nebulizer solution  0.5 mg Nebulization BID  . fentaNYL  12.5 mcg Transdermal Q72H  . gabapentin  100 mg Oral TID  . ipratropium-albuterol  3 mL Nebulization Q4H  . pantoprazole (PROTONIX) IV  40 mg Intravenous Q12H  . pantoprazole (PROTONIX) IV  40 mg Intravenous Q12H  . senna-docusate  2 tablet Oral BID  . sodium chloride flush  3 mL Intravenous Q12H  . sucralfate  1 g Oral TID WC & HS  . tamsulosin  0.4 mg Oral Daily  . ticagrelor  90 mg Oral BID   Continuous Infusions: . sodium chloride    . 0.9 % NaCl with KCl 40 mEq / L 75 mL/hr at 02/07/18 0600  . sodium chloride 1 mL/kg/hr (02/07/18 1209)  . heparin 1,050 Units/hr (02/07/18 0600)  . phenylephrine (NEO-SYNEPHRINE) Adult infusion 30 mcg/min (02/07/18 1500)   PRN Meds:.sodium chloride, acetaminophen **OR** acetaminophen, acetaminophen, alum & mag hydroxide-simeth, hydrALAZINE, labetalol, morphine injection, ondansetron **OR** ondansetron (ZOFRAN) IV, ondansetron (ZOFRAN) IV, oxyCODONE, polyethylene glycol, sodium chloride flush Medications Prior to Admission:  Prior to Admission medications   Medication Sig Start Date End Date Taking? Authorizing Provider  abiraterone acetate (ZYTIGA) 250 MG tablet Take 4 tablets (1,000 mg total) by mouth daily. Take on an empty stomach 1 hour before or 2 hours after a meal 11/03/17  Yes Sindy Guadeloupe, MD  alendronate (FOSAMAX) 70 MG tablet Take 1 tablet (70 mg total) by mouth once a week. Take with a full glass of water on an empty stomach. 10/07/17  Yes Sindy Guadeloupe, MD  aspirin EC 81 MG tablet Take 81 mg by mouth daily.    Yes [provider]  fentaNYL (DURAGESIC - DOSED MCG/HR) 12  MCG/HR Place 1 patch (12.5 mcg total) onto the skin every 3 (three) days. 02/03/18  Yes Sindy Guadeloupe, MD  gabapentin (NEURONTIN) 100 MG capsule Take 1 capsule by mouth 3 (three) times daily. 01/06/18  Yes [provider]  metoprolol (LOPRESSOR) 50 MG tablet Take 50 mg by mouth 2 (two) times daily.  12/13/16  Yes [provider]  predniSONE (DELTASONE) 5 MG tablet  Take 1 tablet (5 mg total) by mouth 2 (two) times daily with a meal. 11/03/17  Yes Sindy Guadeloupe, MD  sennosides-docusate sodium (SENOKOT-S) 8.6-50 MG tablet Take 2 tablets by mouth 2 (two) times daily.    Yes [provider]  tamsulosin (FLOMAX) 0.4 MG CAPS capsule Take 1 capsule (0.4 mg total) by mouth daily. 09/30/17  Yes Hollice Espy, MD  nitroGLYCERIN (NITROSTAT) 0.4 MG SL tablet Place 0.4 mg under the tongue every 5 (five) minutes x 3 doses as needed for chest pain. 06/23/16   [provider]  oxyCODONE (OXY IR/ROXICODONE) 5 MG immediate release tablet Take 3 tablets (15 mg total) by mouth every 6 (six) hours as needed for severe pain. 02/03/18   Earlie Server, MD   No Known Allergies Review of Systems  Constitutional: Positive for fatigue.  Genitourinary: Positive for difficulty urinating.    Physical Exam  Constitutional: He appears well-developed.  Cardiovascular: Tachycardia present.  Skin: Skin is warm and dry.    Vital Signs: BP 99/68   Pulse (!) 108   Temp 98.5 F (36.9 C) (Oral)   Resp 17   Ht 5\' 8"  (1.727 m)   Wt 86.6 kg (191 lb)   SpO2 97%   BMI 29.04 kg/m  Pain Scale: 0-10 POSS *See Group Information*: S-Acceptable,Sleep, easy to arouse Pain Score: Asleep   SpO2: SpO2: 97 % O2 Device:SpO2: 97 % O2 Flow Rate: .O2 Flow Rate (L/min): 10 L/min  IO: Intake/output summary:   Intake/Output Summary (Last 24 hours) at 02/07/2018 1545 Last data filed at 02/07/2018 0700 Gross per 24 hour  Intake 1577.83 ml  Output 400 ml  Net 1177.83 ml    LBM: Last BM Date: 02/02/18 Baseline  Weight: Weight: 86.6 kg (191 lb) Most recent weight: Weight: 86.6 kg (191 lb)      Palliative Assessment/Data:   Discussed with Dr Mortimer Fries  Time In: 1610 Time Out: 1700 Time Total: 50 minutes Greater than 50%  of this time was spent counseling and coordinating care related to the above assessment and plan.  Signed by: Wadie Lessen, NP   Please contact Palliative Medicine Team phone at [phone] for questions and concerns.  For individual provider: See Shea Evans

## 2018-02-07 NOTE — Progress Notes (Signed)
Advanced Care Plan.  Purpose of Encounter: Palliative care and CODE STATUS. Parties in Attendance: The patient, his wife and me. Patient's Decisional Capacity: Yes. Medical Story:   Johnathan Gould a37 y.o.malewith a known history of metastatic prostate cancer on oral chemo, coronary artery disease and hypertension comes to the emergency room with significant epigastric pain since Thursday. Patient takes Fosamax every Thursday on empty stomach and this Thursday he started having significant epigastric pain after taking Fosamax.  The patient was scheduled to get EGD yesterday but developed non-STEMI.  He got cardiac cath and PCI, transferred to ICU.  I discussed with the patient and his wife about his CODE STATUS.  The patient still want DNR.  I also discussed about palliative care due to poor prognosis.  They voiced understanding and agreed to get palliative care consult.  Goals of Care Determinations: Palliative care. Plan:  Code Status: DNR. Time spent discussing advance care planning: 20 minutes.

## 2018-02-07 NOTE — Significant Event (Signed)
Rapid Response Event Note  Overview: Time Called: 1910 Arrival Time: 1913 Event Type: Cardiac  Initial Focused Assessment: Pt. Eyes closed, lethargic, unable to answer questions.  PT. On non-rebreather, breath sounds diminished, pulses palpable. Barnabas Lister, RN had recently spoken to Dr. Josefa Half r/t pt.'s elevated troponin and received orders for heparin, nitro and aspirin.  But pt.'s VS showed SBP in 80's with MAP's > 65, ST with HR in 130's sustained.  Interventions: Obtained 12 lead EKG, started a 500 mL bolus.  Dr. Marthann Schiller came bedside and pt.'s VSS, labs, current orders were discussed, 12 lead provided for viewing.  Pt. Was transferred to ICU for closer monitoring as a Stepdown pt. Transported on tele with ICU charge, Nursing Supervisor and care nurse.  Plan of Care (if not transferred):  Event Summary: Name of Physician Notified: Dr. Marthann Schiller at [phone]  Name of Consulting Physician Notified: Dr. Saralyn Pilar at 1945  Outcome: Transferred (Comment)(ICU for SD monitoring)  Event End Time: 1930  Domingo Pulse [place]

## 2018-02-07 NOTE — Progress Notes (Signed)
eLink Physician-Brief Progress Note Patient Name: Johnathan Gould DOB: [birth year] MRN: [id number]   Date of Service  02/07/2018  HPI/Events of Note  Patient c/o chest pain. Difficult situation. Patient hypotensive and requires Phenylephrine for hemodynamic support. BP = 85/62. Cardiology aware of elevated Troponin.   eICU Interventions  Will order: 1. Nitroglycerin 0.4 mg SL PRN.  2. If no relief with Nitroglycerin SL would treat chest pain with already ordered Morphine.      Intervention Category Major Interventions: Other:  Lysle Dingwall 02/07/2018, 5:34 AM

## 2018-02-07 NOTE — Progress Notes (Addendum)
[place] at [place] NAME: Johnathan Gould    MR#:  [id number]  DATE OF BIRTH:  1935/07/03  SUBJECTIVE:  CHIEF COMPLAINT:   Chief Complaint  Patient presents with  . Chest Pain   Still epigastric pain, s/p heart cath this morning. REVIEW OF SYSTEMS:  Review of Systems  Constitutional: Positive for malaise/fatigue. Negative for chills and fever.  HENT: Negative for sore throat.   Eyes: Negative for blurred vision and double vision.  Respiratory: Negative for cough, hemoptysis, shortness of breath, wheezing and stridor.   Cardiovascular: Negative for chest pain, palpitations, orthopnea and leg swelling.  Gastrointestinal: Positive for abdominal pain. Negative for blood in stool, diarrhea, melena, nausea and vomiting.  Genitourinary: Negative for dysuria, flank pain and hematuria.  Musculoskeletal: Negative for back pain and joint pain.  Skin: Negative for rash.  Neurological: Negative for dizziness, sensory change, focal weakness, seizures, loss of consciousness, weakness and headaches.  Endo/Heme/Allergies: Negative for polydipsia.  Psychiatric/Behavioral: Negative for depression. The patient is not nervous/anxious.     DRUG ALLERGIES:  No Known Allergies VITALS:  Blood pressure 99/68, pulse (!) 108, temperature 98.5 F (36.9 C), temperature source Oral, resp. rate 17, height 5\' 8"  (1.727 m), weight 191 lb (86.6 kg), SpO2 97 %. PHYSICAL EXAMINATION:  Physical Exam  Constitutional: He is oriented to person, place, and time. He appears well-nourished. He appears distressed.  HENT:  Head: Normocephalic.  Mouth/Throat: Oropharynx is clear and moist.  Eyes: Pupils are equal, round, and reactive to light. Conjunctivae and EOM are normal. No scleral icterus.  Neck: Normal range of motion. Neck supple. No JVD present. No tracheal deviation present.  Cardiovascular: Normal rate, regular rhythm and normal heart sounds. Exam reveals no  gallop.  No murmur heard. Pulmonary/Chest: Effort normal and breath sounds normal. No respiratory distress. He has no wheezes. He has no rales.  Abdominal: Soft. Bowel sounds are normal. He exhibits no distension. There is tenderness. There is no rebound.  Musculoskeletal: Normal range of motion. He exhibits no edema or tenderness.  Neurological: He is alert and oriented to person, place, and time. No cranial nerve deficit.  Skin: No rash noted. No erythema.   LABORATORY PANEL:  Male CBC Recent Labs  Lab 02/07/18 0419  WBC 8.3  HGB 10.3*  HCT 32.2*  PLT 239   ------------------------------------------------------------------------------------------------------------------ Chemistries  Recent Labs  Lab 02/05/18 0838 02/06/18 1233 02/07/18 0419  NA 136  --  138  K 3.0*  --  4.0  CL 96*  --  106  CO2 31  --  25  GLUCOSE 120*  --  121*  BUN 34*  --  33*  CREATININE 1.49*  --  2.02*  CALCIUM 8.8*  --  7.5*  MG  --  1.5*  --   AST 22  --   --   ALT 17  --   --   ALKPHOS 69  --   --   BILITOT 0.5  --   --    RADIOLOGY:  Dg Chest 1 View  Result Date: 02/06/2018 CLINICAL DATA:  Hypoxia EXAM: CHEST  1 VIEW COMPARISON:  CT 02/05/2018, radiograph 02/05/2018 FINDINGS: Patient rotation exaggerates the cardiomediastinal silhouette. Streaky atelectasis at the left base. No pleural effusion. Aortic atherosclerosis. No pneumothorax. IMPRESSION: Streaky atelectasis at the left base. Exaggerated cardiomediastinal silhouette secondary to patient rotation. Electronically Signed   By: Donavan Foil M.D.   On: 02/06/2018 20:31   ASSESSMENT AND PLAN:  Johnathan Gould  is a 83 y.o. male with a known history of metastatic prostate cancer on oral chemo, coronary artery disease and hypertension comes to the emergency room with significant epigastric pain since Thursday. Patient takes Fosamax every Thursday on empty stomach and this Thursday he started having significant epigastric pain after  taking Fosamax  *NonSTEMI. The patient developed non-STEMI, treated with heparin drip and got cardiac cath today: 1.  Two-vessel coronary artery disease, with 80% stenosis proximal LAD, occluded mid left circumflex, subtotal occlusion OM1, and patent stent mid RCA 2.  Mild reduced left ventricular function, with anterior apical hypokinesis 3.  Successful PCI, DES proximal LAD. On Brilinta.  1.   Severe epigastric abdominal pain, likely secondary to gastritis the setting taking Fosamax Continue IV Protonix BID, Carafate -RN G.I. Cocktail  PRN pain meds EGD was canceled due to non-STEMI.  2. metastatic prostate cancer Hold chemo drug Zytiga per Dr. [name].  3. Hypertension continue home meds  4.   Hypokalemia.  Improved with potassium supplement.l.  Hypomagnesemia.  Give IV magnesium and follow-up level. ARF on CKD stage III.  Stable. IV fluids support and follow-up BMP. Discussed with Dr. Mortimer Fries. All the records are reviewed and case discussed with Care Management/Social Worker. Management plans discussed with the patient, his wife and son, and they are in agreement.  CODE STATUS: DNR  TOTAL TIME TAKING CARE OF THIS PATIENT: 36 minutes.   More than 50% of the time was spent in counseling/coordination of care: YES  POSSIBLE D/C IN 2-3 DAYS, DEPENDING ON CLINICAL CONDITION.   Demetrios Loll M.D on 02/07/2018 at 2:39 PM  Between 7am to 6pm - Pager - [phone]  After 6pm go to [email or web address] - [facility]

## 2018-02-08 ENCOUNTER — Encounter: Payer: Self-pay | Admitting: Cardiology

## 2018-02-08 DIAGNOSIS — Z515 Encounter for palliative care: Secondary | ICD-10-CM

## 2018-02-08 LAB — BASIC METABOLIC PANEL
ANION GAP: 6 (ref 5–15)
BUN: 25 mg/dL — ABNORMAL HIGH (ref 6–20)
CHLORIDE: 110 mmol/L (ref 101–111)
CO2: 25 mmol/L (ref 22–32)
Calcium: 7.4 mg/dL — ABNORMAL LOW (ref 8.9–10.3)
Creatinine, Ser: 1.57 mg/dL — ABNORMAL HIGH (ref 0.61–1.24)
GFR calc non Af Amer: 40 mL/min — ABNORMAL LOW (ref >60)
GFR, EST AFRICAN AMERICAN: 46 mL/min — AB (ref >60)
Glucose, Bld: 123 mg/dL — ABNORMAL HIGH (ref 65–99)
POTASSIUM: 3.6 mmol/L (ref 3.5–5.1)
SODIUM: 141 mmol/L (ref 135–145)

## 2018-02-08 LAB — CBC
HCT: 28.6 % — ABNORMAL LOW (ref 40.0–52.0)
HEMOGLOBIN: 9.7 g/dL — AB (ref 13.0–18.0)
MCH: 30 pg (ref 26.0–34.0)
MCHC: 33.9 g/dL (ref 32.0–36.0)
MCV: 88.3 fL (ref 80.0–100.0)
PLATELETS: 205 10*3/uL (ref 150–440)
RBC: 3.23 MIL/uL — AB (ref 4.40–5.90)
RDW: 16.1 % — ABNORMAL HIGH (ref 11.5–14.5)
WBC: 7.8 10*3/uL (ref 3.8–10.6)

## 2018-02-08 LAB — MRSA PCR SCREENING: MRSA by PCR: NEGATIVE

## 2018-02-08 MED ORDER — ALBUTEROL SULFATE (2.5 MG/3ML) 0.083% IN NEBU
2.5000 mg | INHALATION_SOLUTION | RESPIRATORY_TRACT | Status: DC | PRN
  Administered 2018-02-09: 2.5 mg via RESPIRATORY_TRACT
  Filled 2018-02-08: qty 3

## 2018-02-08 MED ORDER — IPRATROPIUM-ALBUTEROL 0.5-2.5 (3) MG/3ML IN SOLN
3.0000 mL | Freq: Four times a day (QID) | RESPIRATORY_TRACT | Status: DC
  Administered 2018-02-09 – 2018-02-10 (×7): 3 mL via RESPIRATORY_TRACT
  Filled 2018-02-08 (×7): qty 3

## 2018-02-08 MED ORDER — STERILE WATER FOR INJECTION IJ SOLN
INTRAMUSCULAR | Status: AC
  Administered 2018-02-08: 10 mL
  Filled 2018-02-08: qty 10

## 2018-02-08 NOTE — Progress Notes (Signed)
Pt has remained alert and oriented with c/o nausea r/t mid, upper, epigastric pain -zofran given x1 with decreased nausea. Pt has since been able to eat ice chips, sherbert, and tolerate sips of water without n/v.  Pt has transitioned to [facility] from 3LNC-SpO2 >90%, lung sounds clear to auscultation. Pt intermittently experiences a strong, non-productive cough.  Pt has remained in ST-fluctuating and sustaining in mid 120s at times, BP remains soft sys in the 90s-MAP >65.  Fentanyl patch d/c-d/c'd for soft pressures and for evaluation of relation to new nausea onset. Per pt it was recently ordered by his oncologist for osteo related pain-per pt he has taken it in the past and it was uneffective. Pt also takes PRN oxycodone at home for pain-this is ordered in his MAR.  ST could be r/t reflex tachycardia from his 50mg  metoprolol dose that he usually takes at home-this has not been reinstated since his hypotension. Per Dr Stephani Police we could reorder it at a low dose for his tachycardia if needed-but for now will hold off on this order until his BP improves. Minimal output thus this shift- 250ml-will continue to monitor-Cr trending down. Pt has orders to transfer to telemetry.

## 2018-02-08 NOTE — Progress Notes (Signed)
[place] at [place] NAME: Johnathan Gould    MR#:  [id number]  DATE OF BIRTH:  Mar 20, 1935  SUBJECTIVE:  CHIEF COMPLAINT:   Chief Complaint  Patient presents with  . Chest Pain   Still epigastric pain, s/p heart cath , wife at bedside REVIEW OF SYSTEMS:  Review of Systems  Constitutional: Positive for malaise/fatigue. Negative for chills and fever.  HENT: Negative for sore throat.   Eyes: Negative for blurred vision and double vision.  Respiratory: Negative for cough, hemoptysis, shortness of breath, wheezing and stridor.   Cardiovascular: Negative for chest pain, palpitations, orthopnea and leg swelling.  Gastrointestinal: Positive for abdominal pain. Negative for blood in stool, diarrhea, melena, nausea and vomiting.  Genitourinary: Negative for dysuria, flank pain and hematuria.  Musculoskeletal: Negative for back pain and joint pain.  Skin: Negative for rash.  Neurological: Negative for dizziness, sensory change, focal weakness, seizures, loss of consciousness, weakness and headaches.  Endo/Heme/Allergies: Negative for polydipsia.  Psychiatric/Behavioral: Negative for depression. The patient is not nervous/anxious.     DRUG ALLERGIES:  No Known Allergies VITALS:  Blood pressure (!) 93/57, pulse (!) 121, temperature 97.6 F (36.4 C), temperature source Oral, resp. rate 19, height 5\' 8"  (1.727 m), weight 86.6 kg (191 lb), SpO2 91 %. PHYSICAL EXAMINATION:  Physical Exam  Constitutional: He is oriented to person, place, and time. He appears well-nourished. He appears distressed.  HENT:  Head: Normocephalic.  Mouth/Throat: Oropharynx is clear and moist.  Eyes: Pupils are equal, round, and reactive to light. Conjunctivae and EOM are normal. No scleral icterus.  Neck: Normal range of motion. Neck supple. No JVD present. No tracheal deviation present.  Cardiovascular: Normal rate, regular rhythm and normal heart sounds. Exam reveals  no gallop.  No murmur heard. Pulmonary/Chest: Effort normal and breath sounds normal. No respiratory distress. He has no wheezes. He has no rales.  Abdominal: Soft. Bowel sounds are normal. He exhibits no distension. There is tenderness. There is no rebound.  Musculoskeletal: Normal range of motion. He exhibits no edema or tenderness.  Neurological: He is alert and oriented to person, place, and time. No cranial nerve deficit.  Skin: No rash noted. No erythema.   LABORATORY PANEL:  Male CBC Recent Labs  Lab 02/08/18 0439  WBC 7.8  HGB 9.7*  HCT 28.6*  PLT 205   ------------------------------------------------------------------------------------------------------------------ Chemistries  Recent Labs  Lab 02/05/18 0838 02/06/18 1233  02/08/18 0439  NA 136  --    < > 141  K 3.0*  --    < > 3.6  CL 96*  --    < > 110  CO2 31  --    < > 25  GLUCOSE 120*  --    < > 123*  BUN 34*  --    < > 25*  CREATININE 1.49*  --    < > 1.57*  CALCIUM 8.8*  --    < > 7.4*  MG  --  1.5*  --   --   AST 22  --   --   --   ALT 17  --   --   --   ALKPHOS 69  --   --   --   BILITOT 0.5  --   --   --    < > = values in this interval not displayed.   RADIOLOGY:  No results found. ASSESSMENT AND PLAN:    Sergi Gellner  is a  83 y.o. male with a known history of metastatic prostate cancer on oral chemo, coronary artery disease and hypertension comes to the emergency room with significant epigastric pain since Thursday. Patient takes Fosamax every Thursday on empty stomach and this Thursday he started having significant epigastric pain after taking Fosamax  *NonSTEMI. The patient developed non-STEMI, treated with heparin drip and got cardiac cath 02/07/18 and had a stent placement 1.  Two-vessel coronary artery disease, with 80% stenosis proximal LAD, occluded mid left circumflex, subtotal occlusion OM1, and patent stent mid RCA 2.  Mild reduced left ventricular function, with anterior apical  hypokinesis 3.  Successful PCI, DES proximal LAD. On Brilinta. Dr. Saralyn Pilar is following.  Patient is hypotensive and tachycardic after stopping the Neo-Synephrine drip today, continue close monitoring of the patient  *   Severe epigastric abdominal pain, likely secondary to gastritis the setting taking Fosamax Continue IV Protonix BID, Carafate -RN G.I. Cocktail  PRN pain meds EGD was canceled due to non-STEMI.  *. metastatic prostate cancer Hold chemo drug Zytiga per Dr. [name].  * Hypertension continue home meds  *   Hypokalemia.  Improved with potassium supplement.l.  *Hypomagnesemia.  Give IV magnesium and follow-up level.  *ARF on CKD stage III.  Stable. Better today creatinine 2.02-1.57 IV fluids support and follow-up BMP. Discussed with Dr. Jamal Collin and Dr. Saralyn Pilar   All the records are reviewed and case discussed with Care Management/Social Worker. Management plans discussed with the patient, his wife and son, and they are in agreement.  CODE STATUS: DNR  TOTAL TIME TAKING CARE OF THIS PATIENT: 36 minutes.   More than 50% of the time was spent in counseling/coordination of care: YES  POSSIBLE D/C IN 2 DAYS, DEPENDING ON CLINICAL CONDITION.   Nicholes Mango M.D on 02/08/2018 at 4:24 PM  Between 7am to 6pm - Pager - [phone]  After 6pm go to [email or web address] - [facility]

## 2018-02-08 NOTE — Plan of Care (Signed)
  Problem: Clinical Measurements: Goal: Cardiovascular complication will be avoided Outcome: Progressing   Problem: Pain Managment: Goal: General experience of comfort will improve Outcome: Progressing   Problem: Cardiac: Goal: Ability to achieve and maintain adequate cardiovascular perfusion will improve Outcome: Progressing   

## 2018-02-09 ENCOUNTER — Ambulatory Visit: Payer: Medicare Other

## 2018-02-09 MED ORDER — PANTOPRAZOLE SODIUM 40 MG PO TBEC
40.0000 mg | DELAYED_RELEASE_TABLET | Freq: Two times a day (BID) | ORAL | Status: DC
  Administered 2018-02-09 – 2018-02-10 (×3): 40 mg via ORAL
  Filled 2018-02-09 (×2): qty 1

## 2018-02-09 MED ORDER — ATORVASTATIN CALCIUM 20 MG PO TABS
40.0000 mg | ORAL_TABLET | Freq: Every day | ORAL | Status: DC
  Administered 2018-02-09: 40 mg via ORAL
  Filled 2018-02-09 (×2): qty 2

## 2018-02-09 MED ORDER — METOPROLOL SUCCINATE ER 25 MG PO TB24
25.0000 mg | ORAL_TABLET | Freq: Every day | ORAL | Status: DC
  Administered 2018-02-09: 25 mg via ORAL
  Filled 2018-02-09: qty 1

## 2018-02-09 NOTE — Progress Notes (Signed)
[place] [facility] [place] NAME: Johnathan Gould    MR#:  [id number]  DATE OF BIRTH:  [birth year]  SUBJECTIVE:  CHIEF COMPLAINT:   Chief Complaint  Patient presents with  . Chest Pain   Still epigastric pain, s/p heart cath , wife at bedside REVIEW OF SYSTEMS:  Review of Systems  Constitutional: Positive for malaise/fatigue. Negative for chills and fever.  HENT: Negative for sore throat.   Eyes: Negative for blurred vision and double vision.  Respiratory: Negative for cough, hemoptysis, shortness of breath, wheezing and stridor.   Cardiovascular: Negative for chest pain, palpitations, orthopnea and leg swelling.  Gastrointestinal: Positive for abdominal pain. Negative for blood in stool, diarrhea, melena, nausea and vomiting.  Genitourinary: Negative for dysuria, flank pain and hematuria.  Musculoskeletal: Negative for back pain and joint pain.  Skin: Negative for rash.  Neurological: Negative for dizziness, sensory change, focal weakness, seizures, loss of consciousness, weakness and headaches.  Endo/Heme/Allergies: Negative for polydipsia.  Psychiatric/Behavioral: Negative for depression. The patient is not nervous/anxious.     DRUG ALLERGIES:  No Known Allergies VITALS:  Blood pressure 124/83, pulse (!) 108, temperature 99 F (37.2 C), temperature source Oral, resp. rate 18, height 5\' 8"  (1.727 m), weight 86.6 kg (191 lb), SpO2 93 %. PHYSICAL EXAMINATION:  Physical Exam  Constitutional: He is oriented to person, place, and time. He appears well-nourished. He appears distressed.  HENT:  Head: Normocephalic.  Mouth/Throat: Oropharynx is clear and moist.  Eyes: Pupils are equal, round, and reactive to light. Conjunctivae and EOM are normal. No scleral icterus.  Neck: Normal range of motion. Neck supple. No JVD present. No tracheal deviation present.  Cardiovascular: Normal rate, regular rhythm and normal heart sounds. Exam reveals no  gallop.  No murmur heard. Pulmonary/Chest: Effort normal and breath sounds normal. No respiratory distress. He has no wheezes. He has no rales.  Abdominal: Soft. Bowel sounds are normal. He exhibits no distension. There is tenderness. There is no rebound.  Musculoskeletal: Normal range of motion. He exhibits no edema or tenderness.  Neurological: He is alert and oriented to person, place, and time. No cranial nerve deficit.  Skin: No rash noted. No erythema.   LABORATORY PANEL:  Male CBC Recent Labs  Lab 02/08/18 0439  WBC 7.8  HGB 9.7*  HCT 28.6*  PLT 205   ------------------------------------------------------------------------------------------------------------------ Chemistries  Recent Labs  Lab 02/05/18 0838 02/06/18 1233  02/08/18 0439  NA 136  --    < > 141  K 3.0*  --    < > 3.6  CL 96*  --    < > 110  CO2 31  --    < > 25  GLUCOSE 120*  --    < > 123*  BUN 34*  --    < > 25*  CREATININE 1.49*  --    < > 1.57*  CALCIUM 8.8*  --    < > 7.4*  MG  --  1.5*  --   --   AST 22  --   --   --   ALT 17  --   --   --   ALKPHOS 69  --   --   --   BILITOT 0.5  --   --   --    < > = values in this interval not displayed.   RADIOLOGY:  No results found. ASSESSMENT AND PLAN:    Johnathan Gould  is a 83  y.o. male with a known history of metastatic prostate cancer on oral chemo, coronary artery disease and hypertension comes to the emergency room with significant epigastric pain since Thursday. Patient takes Fosamax every Thursday on empty stomach and this Thursday he started having significant epigastric pain after taking Fosamax  *NonSTEMI. The patient developed non-STEMI, treated with heparin drip and got cardiac cath 02/07/18 and had a stent placement 1.  Two-vessel coronary artery disease, with 80% stenosis proximal LAD, occluded mid left circumflex, subtotal occlusion OM1, and patent stent mid RCA 2.  Mild reduced left ventricular function, with anterior apical  hypokinesis 3.  Successful PCI, DES proximal LAD. On Brilinta.Continue dual antiplatelet therapy uninterrupted for 1 year OP F/U IN a week On lipitor Pressor discontinued Dr. Saralyn Pilar is following. , continue close monitoring of the patient Patient is tachycardic and started on beta-blocker will titrate the dose as needed -Patient is not a good candidate for cardiac rehabilitation given his functional status and medical condition  * sinus tachy Patient is started on beta-blocker titrate the dose as needed gentle hydration with IV fluids  *   Severe epigastric abdominal pain, likely secondary to gastritis the setting taking Fosamax Clinically improving Change IV Protonix BID to p.o. Protonix, Carafate -RN G.I. Cocktail  PRN pain meds EGD was canceled due to non-STEMI. No cardiology is recommending dual antiplatelet therapy uninterrupted for 1 year  *. metastatic prostate cancer Hold chemo drug Zytiga per Dr. [name].  * Hypertension continue home meds  *   Hypokalemia.  Improved with potassium supplement.l.  *Hypomagnesemia.  Give IV magnesium and follow-up level.  *ARF on CKD stage III.  Stable. Better today creatinine 2.02-1.57 IV fluids support and follow-up BMP. Discussed with Dr. Jamal Collin and Dr. Saralyn Pilar   PT consulted All the records are reviewed and case discussed with Care Management/Social Worker. Management plans discussed with the patient, his wife and son, and they are in agreement.  CODE STATUS: DNR  TOTAL TIME TAKING CARE OF THIS PATIENT: 36 minutes.   More than 50% of the time was spent in counseling/coordination of care: YES  POSSIBLE D/C IN 2 DAYS, DEPENDING ON CLINICAL CONDITION.   Nicholes Mango M.D on 02/09/2018 at 1:56 PM  Between 7am to 6pm - Pager - [phone]  After 6pm go to [email or web address] - [facility]

## 2018-02-09 NOTE — NC FL2 (Signed)
[place] LEVEL OF CARE SCREENING TOOL     IDENTIFICATION  Patient Name: Johnathan Gould Birthdate: May 12, 1935 Sex: male Admission Date (Current Location): 02/05/2018  [place] [employer] and [place] Number:  Engineering geologist and Address:  [facility], [address], [place], [place] [address]      Provider Number: [id number]  Attending Physician Name and Address:  Nicholes Mango, MD  Relative Name and Phone Number:  Gaylon, Bentz [phone]     Current Level of Care: Hospital Recommended Level of Care: [place] Prior Approval Number:    Date Approved/Denied:   PASRR Number: [id number]  Discharge Plan: SNF    Current Diagnoses: Patient Active Problem List   Diagnosis Date Noted  . Palliative care by specialist   . NSTEMI (non-ST elevated myocardial infarction) ([place]) 02/06/2018  . Epigastric abdominal pain 02/05/2018  . Goals of care, counseling/discussion 12/10/2016  . Anemia of chronic renal failure 11/22/2016  . Hypotension 11/20/2016  . Dehydration 11/20/2016  . Urinary retention 11/20/2016  . Prostate cancer metastatic to bone ([place]) 11/20/2016  . Prostate cancer ([place])   . Urinary obstruction 10/16/2016  . Acute renal failure (ARF) ([place]) 10/16/2016  . Bladder mass 10/16/2016  . CAD (coronary artery disease) 10/16/2016  . HTN (hypertension) 10/16/2016    Orientation RESPIRATION BLADDER Height & Weight     Self, Time, Situation, Place    Continent Weight: 191 lb (86.6 kg) Height:  5\' 8"  (172.7 cm)  BEHAVIORAL SYMPTOMS/MOOD NEUROLOGICAL BOWEL NUTRITION STATUS      Continent Diet(Cardiac diet)  AMBULATORY STATUS COMMUNICATION OF NEEDS Skin   Limited Assist Verbally Normal                       Personal Care Assistance Level of Assistance  Bathing, Feeding, Dressing Bathing Assistance: Limited assistance Feeding assistance: Limited assistance Dressing Assistance: Limited assistance      Functional Limitations Info  Hearing, Speech, Sight Sight Info: Adequate Hearing Info: Adequate Speech Info: Adequate    SPECIAL CARE FACTORS FREQUENCY  PT (By licensed PT)     PT Frequency: 5x a week              Contractures Contractures Info: Not present    Additional Factors Info  Code Status, Allergies Code Status Info: DNR Allergies Info: NKA           Current Medications (02/09/2018):  This is the current hospital active medication list Current Facility-Administered Medications  Medication Dose Route Frequency Provider Last Rate Last Dose  . 0.9 %  sodium chloride infusion  250 mL Intravenous PRN Paraschos, Alexander, MD      . 0.9 % NaCl with KCl 40 mEq / L  infusion   Intravenous Continuous Demetrios Loll, MD 75 mL/hr at 02/08/18 2351 75 mL/hr at 02/08/18 2351  . acetaminophen (TYLENOL) tablet 650 mg  650 mg Oral Q6H PRN Fritzi Mandes, MD       Or  . acetaminophen (TYLENOL) suppository 650 mg  650 mg Rectal Q6H PRN Fritzi Mandes, MD      . acetaminophen (TYLENOL) tablet 650 mg  650 mg Oral Q4H PRN Paraschos, Alexander, MD      . albuterol (PROVENTIL) (2.5 MG/3ML) 0.083% nebulizer solution 2.5 mg  2.5 mg Nebulization Q4H PRN Gouru, Aruna, MD   2.5 mg at 02/09/18 5993  . alum & mag hydroxide-simeth (MAALOX/MYLANTA) 200-200-20 MG/5ML suspension 30 mL  30 mL Oral Q6H PRN Demetrios Loll, MD      .  aspirin chewable tablet 81 mg  81 mg Oral Daily Paraschos, Alexander, MD   81 mg at 02/09/18 0939  . atorvastatin (LIPITOR) tablet 40 mg  40 mg Oral q1800 Paraschos, Alexander, MD   40 mg at 02/09/18 1629  . budesonide (PULMICORT) nebulizer solution 0.5 mg  0.5 mg Nebulization BID Flora Lipps, MD   0.5 mg at 02/09/18 0809  . gabapentin (NEURONTIN) capsule 100 mg  100 mg Oral TID Fritzi Mandes, MD   100 mg at 02/09/18 1629  . ipratropium-albuterol (DUONEB) 0.5-2.5 (3) MG/3ML nebulizer solution 3 mL  3 mL Nebulization QID Gouru, Aruna, MD   3 mL at 02/09/18 1617  . metoprolol succinate  (TOPROL-XL) 24 hr tablet 25 mg  25 mg Oral Daily Paraschos, Alexander, MD   25 mg at 02/09/18 0939  . morphine 4 MG/ML injection 4 mg  4 mg Intravenous Q4H PRN Demetrios Loll, MD   4 mg at 02/08/18 2010  . ondansetron (ZOFRAN) tablet 4 mg  4 mg Oral Q6H PRN Fritzi Mandes, MD       Or  . ondansetron [facility]) injection 4 mg  4 mg Intravenous Q6H PRN Fritzi Mandes, MD   4 mg at 02/08/18 0406  . ondansetron (ZOFRAN) injection 4 mg  4 mg Intravenous Q6H PRN Paraschos, Alexander, MD   4 mg at 02/08/18 2010  . oxyCODONE (Oxy IR/ROXICODONE) immediate release tablet 15 mg  15 mg Oral Q6H PRN Harrie Foreman, MD   15 mg at 02/09/18 1636  . pantoprazole (PROTONIX) EC tablet 40 mg  40 mg Oral BID Gouru, Aruna, MD   40 mg at 02/09/18 1616  . polyethylene glycol (MIRALAX / GLYCOLAX) packet 17 g  17 g Oral Daily PRN Fritzi Mandes, MD      . senna-docusate (Senokot-S) tablet 2 tablet  2 tablet Oral BID Fritzi Mandes, MD   2 tablet at 02/09/18 0939  . sodium chloride flush (NS) 0.9 % injection 3 mL  3 mL Intravenous Q12H Paraschos, Alexander, MD   3 mL at 02/09/18 0940  . sodium chloride flush (NS) 0.9 % injection 3 mL  3 mL Intravenous PRN Paraschos, Alexander, MD      . sucralfate (CARAFATE) 1 GM/10ML suspension 1 g  1 g Oral TID WC & HS Efrain Sella, MD   1 g at 02/09/18 1629  . tamsulosin (FLOMAX) capsule 0.4 mg  0.4 mg Oral Daily Fritzi Mandes, MD   0.4 mg at 02/09/18 0939  . ticagrelor (BRILINTA) tablet 90 mg  90 mg Oral BID Isaias Cowman, MD   90 mg at 02/09/18 0932     Discharge Medications: Please see discharge summary for a list of discharge medications.  Relevant Imaging Results:  Relevant Lab Results:   Additional Information SSN [id number]  Ross Ludwig, [place]

## 2018-02-09 NOTE — Progress Notes (Signed)
[facility] Cardiology  SUBJECTIVE: Patient laying in bed, denies chest pain or shortness of breath   Vitals:   02/08/18 1642 02/08/18 1937 02/08/18 2115 02/09/18 0339  BP: 112/66 111/78  117/78  Pulse: (!) 115 (!) 117 (!) 113 (!) 109  Resp: 18 18 16 18   Temp: 99.3 F (37.4 C) 98.1 F (36.7 C)  98.5 F (36.9 C)  TempSrc: Oral Oral    SpO2: 98% 98% 96% 98%  Weight:      Height:         Intake/Output Summary (Last 24 hours) at 02/09/2018 0804 Last data filed at 02/09/2018 0426 Gross per 24 hour  Intake 1175 ml  Output 226 ml  Net 949 ml      PHYSICAL EXAM  General: Well developed, well nourished, in no acute distress HEENT:  Normocephalic and atramatic Neck:  No JVD.  Lungs: Clear bilaterally to auscultation and percussion. Heart: HRRR . Normal S1 and S2 without gallops or murmurs.  Abdomen: Bowel sounds are positive, abdomen soft and non-tender  Msk:  Back normal, normal gait. Normal strength and tone for age. Extremities: No clubbing, cyanosis or edema.   Neuro: Alert and oriented X 3. Psych:  Good affect, responds appropriately   LABS: Basic Metabolic Panel: Recent Labs    02/06/18 1233 02/07/18 0419 02/08/18 0439  NA  --  138 141  K  --  4.0 3.6  CL  --  106 110  CO2  --  25 25  GLUCOSE  --  121* 123*  BUN  --  33* 25*  CREATININE  --  2.02* 1.57*  CALCIUM  --  7.5* 7.4*  MG 1.5*  --   --    Liver Function Tests: No results for input(s): AST, ALT, ALKPHOS, BILITOT, PROT, ALBUMIN in the last 72 hours. No results for input(s): LIPASE, AMYLASE in the last 72 hours. CBC: Recent Labs    02/07/18 0419 02/08/18 0439  WBC 8.3 7.8  HGB 10.3* 9.7*  HCT 32.2* 28.6*  MCV 90.3 88.3  PLT 239 205   Cardiac Enzymes: Recent Labs    02/06/18 2333 02/07/18 0018 02/07/18 0419  TROPONINI 17.46* 27.77* 37.67*   BNP: Invalid input(s): POCBNP D-Dimer: No results for input(s): DDIMER in the last 72 hours. Hemoglobin A1C: No results for input(s): HGBA1C in the  last 72 hours. Fasting Lipid Panel: No results for input(s): CHOL, HDL, LDLCALC, TRIG, CHOLHDL, LDLDIRECT in the last 72 hours. Thyroid Function Tests: No results for input(s): TSH, T4TOTAL, T3FREE, THYROIDAB in the last 72 hours.  Invalid input(s): FREET3 Anemia Panel: Recent Labs    02/06/18 1233  VITAMINB12 175*  FOLATE 29.0  FERRITIN 44  TIBC 323  IRON 77    No results found.   Echo   TELEMETRY: Sinus rhythm:  ASSESSMENT AND PLAN:  Active Problems:   Epigastric abdominal pain   NSTEMI (non-ST elevated myocardial infarction) [facility])   Palliative care by specialist    1.  Non-STEMI, cardiac catheterization revealing two-vessel coronary artery disease, with 80% stenosis proximal LAD, occluded proximal left circumflex, and patent stent mid RCA.  Patient underwent successful DES of proximal LAD with overall clinical improvement with resolution of chest pain.  Recommendations  1.  Continue current medications 2.  Continue dual antiplatelet therapy uninterrupted for 1 year 3.  Start low-dose beta-blocker 4.  Start lipid-lowering agent 5.  Follow-up as outpatient in 1 week  Sign off for now, please call if any questions   Isaias Cowman,  MD, PhD, [facility] 02/09/2018 8:04 AM

## 2018-02-09 NOTE — Care Management Important Message (Signed)
Copy of signed IM left in patient's room.    

## 2018-02-09 NOTE — Evaluation (Signed)
Physical Therapy Evaluation Patient Details Name: Johnathan Gould MRN: [id number] DOB: 1935-06-15 Today's Date: 02/09/2018   History of Present Illness  83 y.o. male with a known history of metastatic prostate cancer on oral chemo, coronary artery disease and hypertension comes to the emergency room with significant epigastric pain  Clinical Impression  Pt generally dealing with pain t/o the session and c/o chronic pain from bone mets.  His resting HR was in the 100-110 and increased to 140 with minimal activity.  Did not push pt this session as he was fatigued and vitals were not stable with activity.  Per today's exam pt will need STR on discharge.    Follow Up Recommendations SNF    Equipment Recommendations       Recommendations for Other Services       Precautions / Restrictions Precautions Precautions: Fall Restrictions Weight Bearing Restrictions: No      Mobility  Bed Mobility Overal bed mobility: Needs Assistance Bed Mobility: Supine to Sit     Supine to sit: Min assist     General bed mobility comments: Pt needed some assist to get to EOB  Transfers Overall transfer level: Needs assistance Equipment used: Rolling walker (2 wheeled) Transfers: Sit to/from Stand Sit to Stand: Min assist            Ambulation/Gait Ambulation/Gait assistance: Min assist Ambulation Distance (Feet): 4 Feet Assistive device: Rolling walker (2 wheeled)       General Gait Details: Pt's HR was in the 110s on getting up, quickly rose to 140 bpm and PT got pt to sitting.  Did not attempt any prolonged ambulation/activity  Stairs            Wheelchair Mobility    Modified Rankin (Stroke Patients Only)       Balance Overall balance assessment: Needs assistance   Sitting balance-Leahy Scale: Fair       Standing balance-Leahy Scale: Poor                               Pertinent Vitals/Pain Pain Assessment: 0-10 Pain Score: 8  Pain Location:  all over, R shoulder and L knee are the worst    Home Living Family/patient expects to be discharged to:: Private residence Living Arrangements: Spouse/significant other Available Help at Discharge: Family   Home Access: Stairs to enter   Technical brewer of Steps: 2   Home Equipment: Cane - single point      Prior Function Level of Independence: Independent with assistive device(s)         Comments: Pt has not been overly active recently, does still get out of the home occasionally     Hand Dominance        Extremity/Trunk Assessment   Upper Extremity Assessment Upper Extremity Assessment: Generalized weakness(very limited R shoulder AROM, L elevation to only 60)    Lower Extremity Assessment Lower Extremity Assessment: Generalized weakness       Communication   Communication: No difficulties  Cognition Arousal/Alertness: Awake/alert Behavior During Therapy: WFL for tasks assessed/performed Overall Cognitive Status: Within Functional Limits for tasks assessed                                        General Comments      Exercises     Assessment/Plan    PT Assessment  Patient needs continued PT services  PT Problem List Decreased strength;Decreased range of motion;Decreased activity tolerance;Decreased balance;Decreased mobility;Decreased coordination;Decreased knowledge of use of DME;Decreased safety awareness;Pain       PT Treatment Interventions DME instruction;Gait training;Stair training;Functional mobility training;Therapeutic activities;Therapeutic exercise;Balance training;Neuromuscular re-education;Patient/family education    PT Goals (Current goals can be found in the Care Plan section)  Acute Rehab PT Goals Patient Stated Goal: control the systemic bone pain PT Goal Formulation: With patient Time For Goal Achievement: 02/23/18 Potential to Achieve Goals: Fair    Frequency Min 2X/week   Barriers to discharge         Co-evaluation               AM-PAC PT "6 Clicks" Daily Activity  Outcome Measure Difficulty turning over in bed (including adjusting bedclothes, sheets and blankets)?: Unable Difficulty moving from lying on back to sitting on the side of the bed? : Unable Difficulty sitting down on and standing up from a chair with arms (e.g., wheelchair, bedside commode, etc,.)?: Unable Help needed moving to and from a bed to chair (including a wheelchair)?: A Little Help needed walking in hospital room?: A Lot Help needed climbing 3-5 steps with a railing? : Total 6 Click Score: 9    End of Session Equipment Utilized During Treatment: Gait belt       PT Visit Diagnosis: Muscle weakness (generalized) (M62.81);Difficulty in walking, not elsewhere classified (R26.2)    Time: 6387-5643 PT Time Calculation (min) (ACUTE ONLY): 27 min   Charges:   PT Evaluation $PT Eval Low Complexity: 1 Low     PT G Codes:        Kreg Shropshire, DPT 02/09/2018, 11:40 AM

## 2018-02-09 NOTE — Plan of Care (Signed)
  Problem: Pain Managment: Goal: General experience of comfort will improve Outcome: Progressing   

## 2018-02-09 NOTE — Clinical Social Work Note (Signed)
Clinical Social Work Assessment  Patient Details  Name: Johnathan Gould MRN: [id number] Date of Birth: 13-Jan-1935  Date of referral:  02/09/18               Reason for consult:  Facility Placement                Permission sought to share information with:  Family Supports Permission granted to share information::  Yes, Verbal Permission Granted  Name::     Johnathan, Gould [phone] or Johnathan, Gould [phone] or Johnathan, Gould [phone]  [phone]   Agency::  SNF admissions  Relationship::     Contact Information:     Housing/Transportation Living arrangements for the past 2 months:  Single Family Home Source of Information:  Patient, Spouse Patient Interpreter Needed:  None Criminal Activity/Legal Involvement Pertinent to Current Situation/Hospitalization:  No - Comment as needed Significant Relationships:  Adult Children, Spouse Lives with:  Spouse Do you feel safe going back to the place where you live?  No Need for family participation in patient care:  Yes (Comment)  Care giving concerns:  Patient and family feel he needs some short term rehab before returning back home.   Social Worker assessment / plan:  Patient is an 83 year old male who is alert and oriented x4.  Patient states he has not been to rehab before, CSW spoke to patient and his wife to discuss SNF placement options and process.  CSW explained what to expect at SNF and how insurance will pay for stay.  Patient's wife was at bedside, and she stated that patient's daughter in law works at [employer] [facility] [employer] and they would prefer to go there if possible.  CSW explained it will depend on bed availability and if they feel they can meet patient's needs.  Patient's wife expressed understanding.  Patient and his wife did not have any other questions and gave CSW permission to begin bed search in [place].  Employment status:  Retired Nurse, adult PT Recommendations:   [place] / Referral to community resources:  [place]  Patient/Family's Response to care:  Patient and family agreeable to going to SNF for short term rehab.  Patient/Family's Understanding of and Emotional Response to Diagnosis, Current Treatment, and Prognosis:  Patient and family are hopeful that he will not have to be in rehab very long. Emotional Assessment Appearance:  Appears stated age Attitude/Demeanor/Rapport:    Affect (typically observed):  Calm Orientation:  Oriented to Place, Oriented to  Time, Oriented to Situation, Oriented to Self Alcohol / Substance use:  Not Applicable Psych involvement (Current and /or in the community):  No (Comment)  Discharge Needs  Concerns to be addressed:  Lack of Support Readmission within the last 30 days:  No Current discharge risk:  Lack of support system Barriers to Discharge:  Continued Medical Work up, [employer]   Johnathan Gould 02/09/2018, 5:56 PM

## 2018-02-10 LAB — BASIC METABOLIC PANEL
Anion gap: 3 — ABNORMAL LOW (ref 5–15)
BUN: 14 mg/dL (ref 6–20)
CALCIUM: 7.5 mg/dL — AB (ref 8.9–10.3)
CO2: 24 mmol/L (ref 22–32)
Chloride: 113 mmol/L — ABNORMAL HIGH (ref 101–111)
Creatinine, Ser: 1.3 mg/dL — ABNORMAL HIGH (ref 0.61–1.24)
GFR calc Af Amer: 58 mL/min — ABNORMAL LOW (ref >60)
GFR, EST NON AFRICAN AMERICAN: 50 mL/min — AB (ref >60)
GLUCOSE: 101 mg/dL — AB (ref 65–99)
Potassium: 3.5 mmol/L (ref 3.5–5.1)
SODIUM: 140 mmol/L (ref 135–145)

## 2018-02-10 LAB — CBC
HCT: 28.2 % — ABNORMAL LOW (ref 40.0–52.0)
Hemoglobin: 9.3 g/dL — ABNORMAL LOW (ref 13.0–18.0)
MCH: 29.9 pg (ref 26.0–34.0)
MCHC: 32.9 g/dL (ref 32.0–36.0)
MCV: 91.1 fL (ref 80.0–100.0)
PLATELETS: 220 10*3/uL (ref 150–440)
RBC: 3.09 MIL/uL — ABNORMAL LOW (ref 4.40–5.90)
RDW: 16.4 % — AB (ref 11.5–14.5)
WBC: 4.9 10*3/uL (ref 3.8–10.6)

## 2018-02-10 MED ORDER — ATORVASTATIN CALCIUM 40 MG PO TABS: 40.0000 mg | ORAL_TABLET | Freq: Every day | ORAL | 0 refills | Status: DC

## 2018-02-10 MED ORDER — TICAGRELOR 90 MG PO TABS: 90.0000 mg | ORAL_TABLET | Freq: Two times a day (BID) | ORAL | 0 refills | Status: DC

## 2018-02-10 MED ORDER — OXYCODONE HCL 5 MG PO TABS: 15.0000 mg | ORAL_TABLET | Freq: Four times a day (QID) | ORAL | 0 refills | Status: DC | PRN

## 2018-02-10 MED ORDER — METOPROLOL TARTRATE 25 MG PO TABS
37.5000 mg | ORAL_TABLET | Freq: Two times a day (BID) | ORAL | Status: DC
  Administered 2018-02-10: 37.5 mg via ORAL
  Filled 2018-02-10: qty 2

## 2018-02-10 MED ORDER — IPRATROPIUM-ALBUTEROL 0.5-2.5 (3) MG/3ML IN SOLN: 3.0000 mL | Freq: Four times a day (QID) | RESPIRATORY_TRACT | 0 refills | Status: DC | PRN

## 2018-02-10 MED ORDER — BUDESONIDE-FORMOTEROL FUMARATE 160-4.5 MCG/ACT IN AERO: 2.0000 | INHALATION_SPRAY | Freq: Two times a day (BID) | RESPIRATORY_TRACT | 0 refills | Status: DC

## 2018-02-10 MED ORDER — GABAPENTIN 100 MG PO CAPS: 100.0000 mg | ORAL_CAPSULE | Freq: Every day | ORAL | 0 refills | Status: AC

## 2018-02-10 MED ORDER — FENTANYL 12 MCG/HR TD PT72: 12.5000 ug | MEDICATED_PATCH | TRANSDERMAL | 0 refills | Status: DC

## 2018-02-10 MED ORDER — SUCRALFATE 1 GM/10ML PO SUSP: 1.0000 g | Freq: Three times a day (TID) | ORAL | 0 refills | Status: DC

## 2018-02-10 MED ORDER — PANTOPRAZOLE SODIUM 40 MG PO TBEC: 40.0000 mg | DELAYED_RELEASE_TABLET | Freq: Two times a day (BID) | ORAL | 0 refills | Status: AC

## 2018-02-10 NOTE — Plan of Care (Signed)
  Problem: Clinical Measurements: Goal: Will remain free from infection Outcome: Progressing   Problem: Pain Managment: Goal: General experience of comfort will improve Outcome: Progressing   Problem: Safety: Goal: Ability to remain free from injury will improve Outcome: Progressing   

## 2018-02-10 NOTE — Progress Notes (Signed)
Pt complained of neck pain 10 out of 10. PRN oxycodone was given.   Update (1134) pt neck pain went down to 7. Administered tylenol. After 45 minutes pt was assessed and states no pain at this time. Will continue to monitor.

## 2018-02-10 NOTE — Clinical Social Work Placement (Signed)
   CLINICAL SOCIAL WORK PLACEMENT  NOTE  Date:  02/10/2018  Patient Details  Name: Johnathan Gould MRN: [id number] Date of Birth: 1935-06-23  Clinical Social Work is seeking post-discharge placement for this patient at the [place] level of care (*CSW will initial, date and re-position this form in  chart as items are completed):  Yes   Patient/family provided with [place] Work Department's list of facilities offering this level of care within the geographic area requested by the patient (or if unable, by the patient's family).  Yes   Patient/family informed of their freedom to choose among providers that offer the needed level of care, that participate in Medicare, Medicaid or managed care program needed by the patient, have an available bed and are willing to accept the patient.  Yes   Patient/family informed of [facility]'s ownership interest in [facility] and [facility], as well as of the fact that they are under no obligation to receive care at these facilities.  PASRR submitted to EDS on 02/09/18     PASRR number received on       Existing PASRR number confirmed on 02/09/18     FL2 transmitted to all facilities in geographic area requested by pt/family on 02/09/18     FL2 transmitted to all facilities within larger geographic area on       Patient informed that his/her managed care company has contracts with or will negotiate with certain facilities, including the following:        Yes   Patient/family informed of bed offers received.  Patient chooses bed at [facility] )     Physician recommends and patient chooses bed at      Patient to be transferred to [facility] ) on 02/10/18.  Patient to be transferred to facility by [facility] )     Patient family notified on 02/10/18 of transfer.  Name of family member notified:  (Patient's wife [name] is at bedside and aware of D/C today. )     PHYSICIAN     Additional Comment:    _______________________________________________ Whitni Pasquini, Veronia Beets, LCSW 02/10/2018, 3:06 PM

## 2018-02-10 NOTE — NC FL2 (Addendum)
[place] LEVEL OF CARE SCREENING TOOL     IDENTIFICATION  Patient Name: Johnathan Gould Birthdate: [birth year] Sex: male Admission Date (Current Location): 02/05/2018  [place] and [place] Number:  Engineering geologist and Address:  [facility], [address] [place], [place] [address]      Provider Number: [id number]  Attending Physician Name and Address:  Gorden Harms, MD  Relative Name and Phone Number:  Traylon, Schimming [phone]     Current Level of Care: Hospital Recommended Level of Care: [place] Prior Approval Number:    Date Approved/Denied:   PASRR Number: [id number]  Discharge Plan: SNF    Current Diagnoses: Patient Active Problem List   Diagnosis Date Noted  . Palliative care by specialist   . NSTEMI (non-ST elevated myocardial infarction) ([place]) 02/06/2018  . Epigastric abdominal pain 02/05/2018  . Goals of care, counseling/discussion 12/10/2016  . Anemia of chronic renal failure 11/22/2016  . Hypotension 11/20/2016  . Dehydration 11/20/2016  . Urinary retention 11/20/2016  . Prostate cancer metastatic to bone ([place]) 11/20/2016  . Prostate cancer ([place])   . Urinary obstruction 10/16/2016  . Acute renal failure (ARF) ([place]) 10/16/2016  . Bladder mass 10/16/2016  . CAD (coronary artery disease) 10/16/2016  . HTN (hypertension) 10/16/2016    Orientation RESPIRATION BLADDER Height & Weight     Self, Time, Situation, Place    Continent Weight: 86.6 kg (191 lb) Height:  5\' 8"  (172.7 cm)  BEHAVIORAL SYMPTOMS/MOOD NEUROLOGICAL BOWEL NUTRITION STATUS      Continent Diet(Cardiac diet)  AMBULATORY STATUS COMMUNICATION OF NEEDS Skin   Limited Assist Verbally Normal                       Personal Care Assistance Level of Assistance  Bathing, Feeding, Dressing Bathing Assistance: Limited assistance Feeding assistance: Limited assistance Dressing Assistance: Limited assistance      Functional Limitations Info  Hearing, Speech, Sight Sight Info: Adequate Hearing Info: Adequate Speech Info: Adequate    SPECIAL CARE FACTORS FREQUENCY  PT (By licensed PT)     PT Frequency: 5x a week              Contractures Contractures Info: Not present    Additional Factors Info  Code Status, Allergies Code Status Info: DNR Allergies Info: NKA           Current Medications (02/10/2018):  This is the current hospital active medication list Current Facility-Administered Medications  Medication Dose Route Frequency Provider Last Rate Last Dose  . 0.9 %  sodium chloride infusion  250 mL Intravenous PRN Paraschos, Alexander, MD      . 0.9 % NaCl with KCl 40 mEq / L  infusion   Intravenous Continuous Demetrios Loll, MD 75 mL/hr at 02/10/18 0849 75 mL/hr at 02/10/18 0849  . acetaminophen (TYLENOL) tablet 650 mg  650 mg Oral Q6H PRN Fritzi Mandes, MD   650 mg at 02/10/18 1245   Or  . acetaminophen (TYLENOL) suppository 650 mg  650 mg Rectal Q6H PRN Fritzi Mandes, MD      . acetaminophen (TYLENOL) tablet 650 mg  650 mg Oral Q4H PRN Paraschos, Alexander, MD      . albuterol (PROVENTIL) (2.5 MG/3ML) 0.083% nebulizer solution 2.5 mg  2.5 mg Nebulization Q4H PRN Gouru, Aruna, MD   2.5 mg at 02/09/18 2119  . alum & mag hydroxide-simeth (MAALOX/MYLANTA) 200-200-20 MG/5ML suspension 30 mL  30 mL Oral Q6H  PRN Demetrios Loll, MD      . aspirin chewable tablet 81 mg  81 mg Oral Daily Paraschos, Alexander, MD   81 mg at 02/10/18 1039  . atorvastatin (LIPITOR) tablet 40 mg  40 mg Oral q1800 Paraschos, Alexander, MD   40 mg at 02/09/18 1629  . budesonide (PULMICORT) nebulizer solution 0.5 mg  0.5 mg Nebulization BID Flora Lipps, MD   0.5 mg at 02/09/18 2039  . gabapentin (NEURONTIN) capsule 100 mg  100 mg Oral TID Fritzi Mandes, MD   100 mg at 02/10/18 1038  . ipratropium-albuterol (DUONEB) 0.5-2.5 (3) MG/3ML nebulizer solution 3 mL  3 mL Nebulization QID Gouru, Aruna, MD   3 mL at 02/10/18 1123  .  metoprolol tartrate (LOPRESSOR) tablet 37.5 mg  37.5 mg Oral BID Daanish Copes D, MD   37.5 mg at 02/10/18 1039  . morphine 4 MG/ML injection 4 mg  4 mg Intravenous Q4H PRN Demetrios Loll, MD   4 mg at 02/09/18 1941  . ondansetron (ZOFRAN) tablet 4 mg  4 mg Oral Q6H PRN Fritzi Mandes, MD       Or  . ondansetron [facility]) injection 4 mg  4 mg Intravenous Q6H PRN Fritzi Mandes, MD   4 mg at 02/08/18 0406  . ondansetron (ZOFRAN) injection 4 mg  4 mg Intravenous Q6H PRN Paraschos, Alexander, MD   4 mg at 02/08/18 2010  . oxyCODONE (Oxy IR/ROXICODONE) immediate release tablet 15 mg  15 mg Oral Q6H PRN Harrie Foreman, MD   15 mg at 02/10/18 1034  . pantoprazole (PROTONIX) EC tablet 40 mg  40 mg Oral BID Gouru, Aruna, MD   40 mg at 02/10/18 1039  . polyethylene glycol (MIRALAX / GLYCOLAX) packet 17 g  17 g Oral Daily PRN Fritzi Mandes, MD      . senna-docusate (Senokot-S) tablet 2 tablet  2 tablet Oral BID Fritzi Mandes, MD   2 tablet at 02/10/18 1039  . sodium chloride flush (NS) 0.9 % injection 3 mL  3 mL Intravenous Q12H Paraschos, Alexander, MD   3 mL at 02/09/18 2252  . sodium chloride flush (NS) 0.9 % injection 3 mL  3 mL Intravenous PRN Paraschos, Alexander, MD      . sucralfate (CARAFATE) 1 GM/10ML suspension 1 g  1 g Oral TID WC & HS Efrain Sella, MD   1 g at 02/10/18 1240  . tamsulosin (FLOMAX) capsule 0.4 mg  0.4 mg Oral Daily Fritzi Mandes, MD   0.4 mg at 02/10/18 1038  . ticagrelor (BRILINTA) tablet 90 mg  90 mg Oral BID Isaias Cowman, MD   90 mg at 02/10/18 1038     Discharge Medications: Please see discharge summary for a list of discharge medications.  Relevant Imaging Results:  Relevant Lab Results:   Additional Information SSN [id number]  Gorden Harms, MD [phone]

## 2018-02-10 NOTE — Progress Notes (Signed)
Clinical Education officer, museum (CSW) presented bed offers to patient's wife [name] and she chose [facility]. [facility] is out of network with [employer]. Per wife she can bring patient's chemo pill zitria from home to [facility]. Per Tiffany admissions coordinator at [facility] she will start [facility] SNF authorization.   [employer], LCSW [phone]

## 2018-02-10 NOTE — Progress Notes (Signed)
Pt d/c to [facility] today.  IV removed intact.  Printed paper and prescription was handed to EMS.  EMS transported pt to [facility].

## 2018-02-10 NOTE — Progress Notes (Signed)
Patient is medically stable for D/C to [facility] today. Per [facility] admissions coordinator at [facility] SNF authorization has been received and patient can come today to room 503. RN will call report and arrange EMS for transport. Clinical Education officer, museum (CSW) sent D/C orders to [facility] via [place]. Patient is aware of above. Patient's wife [name] is at bedside and aware of above. Please reconsult if future social work needs arise. CSW signing off.   [employer], LCSW [phone]

## 2018-02-10 NOTE — Discharge Summary (Addendum)
[place] at [place] NAME: Johnathan Gould    MR#:  [id number]  DATE OF BIRTH:  Jun 15, 1935  DATE OF ADMISSION:  02/05/2018 ADMITTING PHYSICIAN: Fritzi Mandes, MD  DATE OF DISCHARGE: No discharge date for patient encounter.  PRIMARY CARE PHYSICIAN: [name], MD    ADMISSION DIAGNOSIS:  Chest pain, unspecified type [R07.9] Acute gastritis without hemorrhage, unspecified gastritis type [K29.00]  DISCHARGE DIAGNOSIS:  Active Problems:   Epigastric abdominal pain   NSTEMI (non-ST elevated myocardial infarction) [facility])   Palliative care by specialist   SECONDARY DIAGNOSIS:   Past Medical History:  Diagnosis Date  . Arthritis   . CAD (coronary artery disease)   . Hypertension   . Prostate cancer ([place])    metastatic   . RAD (reactive airway disease)     HOSPITAL COURSE:  [name] a3 y.o.malewith a known history of metastatic prostate cancer on oral chemo, coronary artery disease and hypertension comes to the emergency room with significant epigastric pain since Thursday. Patient takes Fosamax every Thursday on empty stomach and this Thursday he started having significant epigastric pain after taking Fosamax  *NonSTEMI Resolving Treated on our acute coronary syndrome protocol, provided heparin drip, status post cardiac cath 02/07/18 and had DES placement:  1. Two-vessel coronary artery disease, with 80% stenosis proximal LAD, occluded mid left circumflex, subtotal occlusion OM1, and patent stent mid RCA 2. Mild reduced left ventricular function, with anterior apical hypokinesis 3. Successful PCI, DES proximal LAD. DAPT with aspirin/Brilinta for 1 year, follow-up with Dr Saralyn Pilar in 1 week for reevaluation, BB Patient is not a good candidate for cardiac rehabilitation given his functional status and medical condition  *Acute sinus tachy Secondary to above Resolving  continue beta-blocker therapy  *Severe  epigastric abdominal pain Most likely secondary to above cannot exclude possible gastritis Treated with PPI, Carafate EGD was canceled due to non-STEMI  *.metastatic prostate cancer Held Zytiga per Dr. [name] Need to follow-up with oncology status post discharge for reevaluation in 2-4 weeks  *Hypertension Stable on current regiment  *  Hypokalemia Repleted  * Acute urinary retension Resolved Continue foley, flomax, will have voding trials at SNF s/p discharge *Hypomagnesemia Repleted  *ARF on CKD stage III Resolved with IV fluids for rehydration Creatinine at discharge was 1.3  DISCHARGE CONDITIONS:  On the day of discharge patient is afebrile, hemodynamically stable, tolerating diet, ready for discharge to skilled nursing facility, for more specific details please see chart  CONSULTS OBTAINED:  Treatment Team:  Sindy Guadeloupe, MD Isaias Cowman, MD  DRUG ALLERGIES:  No Known Allergies  DISCHARGE MEDICATIONS:   Allergies as of 02/10/2018   No Known Allergies     Medication List    TAKE these medications   abiraterone acetate 250 MG tablet Commonly known as:  ZYTIGA Take 4 tablets (1,000 mg total) by mouth daily. Take on an empty stomach 1 hour before or 2 hours after a meal   alendronate 70 MG tablet Commonly known as:  FOSAMAX Take 1 tablet (70 mg total) by mouth once a week. Take with a full glass of water on an empty stomach.   aspirin EC 81 MG tablet Take 81 mg by mouth daily.   atorvastatin 40 MG tablet Commonly known as:  LIPITOR Take 1 tablet (40 mg total) by mouth daily at 6 PM.   budesonide-formoterol 160-4.5 MCG/ACT inhaler Commonly known as:  SYMBICORT Inhale 2 puffs into the lungs 2 (two) times daily.  fentaNYL 12 MCG/HR Commonly known as:  DURAGESIC - dosed mcg/hr Place 1 patch (12.5 mcg total) onto the skin every 3 (three) days.   gabapentin 100 MG capsule Commonly known as:  NEURONTIN Take 1 capsule (100 mg total) by  mouth at bedtime. What changed:  when to take this   ipratropium-albuterol 0.5-2.5 (3) MG/3ML Soln Commonly known as:  DUONEB Take 3 mLs by nebulization every 6 (six) hours as needed (sob, wheezing).   metoprolol tartrate 50 MG tablet Commonly known as:  LOPRESSOR Take 50 mg by mouth 2 (two) times daily.   nitroGLYCERIN 0.4 MG SL tablet Commonly known as:  NITROSTAT Place 0.4 mg under the tongue every 5 (five) minutes x 3 doses as needed for chest pain.   oxyCODONE 5 MG immediate release tablet Commonly known as:  Oxy IR/ROXICODONE Take 3 tablets (15 mg total) by mouth every 6 (six) hours as needed for severe pain.   pantoprazole 40 MG tablet Commonly known as:  PROTONIX Take 1 tablet (40 mg total) by mouth 2 (two) times daily.   predniSONE 5 MG tablet Commonly known as:  DELTASONE Take 1 tablet (5 mg total) by mouth 2 (two) times daily with a meal.   sennosides-docusate sodium 8.6-50 MG tablet Commonly known as:  SENOKOT-S Take 2 tablets by mouth 2 (two) times daily.   sucralfate 1 GM/10ML suspension Commonly known as:  CARAFATE Take 10 mLs (1 g total) by mouth 4 (four) times daily -  with meals and at bedtime.   tamsulosin 0.4 MG Caps capsule Commonly known as:  FLOMAX Take 1 capsule (0.4 mg total) by mouth daily.   ticagrelor 90 MG Tabs tablet Commonly known as:  BRILINTA Take 1 tablet (90 mg total) by mouth 2 (two) times daily.        DISCHARGE INSTRUCTIONS:   If you experience worsening of your admission symptoms, develop shortness of breath, life threatening emergency, suicidal or homicidal thoughts you must seek medical attention immediately by calling 911 or calling your MD immediately  if symptoms less severe.  You Must read complete instructions/literature along with all the possible adverse reactions/side effects for all the Medicines you take and that have been prescribed to you. Take any new Medicines after you have completely understood and accept all  the possible adverse reactions/side effects.   Please note  You were cared for by a hospitalist during your hospital stay. If you have any questions about your discharge medications or the care you received while you were in the hospital after you are discharged, you can call the unit and asked to speak with the hospitalist on call if the hospitalist that took care of you is not available. Once you are discharged, your primary care physician will handle any further medical issues. Please note that NO REFILLS for any discharge medications will be authorized once you are discharged, as it is imperative that you return to your primary care physician (or establish a relationship with a primary care physician if you do not have one) for your aftercare needs so that they can reassess your need for medications and monitor your lab values.    Today   CHIEF COMPLAINT:   Chief Complaint  Patient presents with  . Chest Pain    HISTORY OF PRESENT ILLNESS:  83 y.o. male with a known history of metastatic prostate cancer on oral chemo, coronary artery disease and hypertension comes to the emergency room with significant epigastric pain since Thursday. Patient takes Fosamax  every Thursday on empty stomach and this Thursday he started having significant epigastric pain after taking Fosamax. He has not been able to eat much. He had a couple times vomiting at home. He is very uncomfortable had couple rounds of morphine, G.I. cocktail, Zofran. CT abdomen nothing acute. Patient is being admitted with severe acute gastritis likely in the setting of taking Fosamax. Received IV Protonix. VITAL SIGNS:  Blood pressure 122/86, pulse (!) 107, temperature 97.7 F (36.5 C), temperature source Oral, resp. rate 20, height 5\' 8"  (1.727 m), weight 86.6 kg (191 lb), SpO2 96 %.  I/O:    Intake/Output Summary (Last 24 hours) at 02/10/2018 1124 Last data filed at 02/09/2018 1934 Gross per 24 hour  Intake -  Output 750 ml   Net -750 ml    PHYSICAL EXAMINATION:  GENERAL:  83 y.o.-year-old patient lying in the bed with no acute distress.  EYES: Pupils equal, round, reactive to light and accommodation. No scleral icterus. Extraocular muscles intact.  HEENT: Head atraumatic, normocephalic. Oropharynx and nasopharynx clear.  NECK:  Supple, no jugular venous distention. No thyroid enlargement, no tenderness.  LUNGS: Normal breath sounds bilaterally, no wheezing, rales,rhonchi or crepitation. No use of accessory muscles of respiration.  CARDIOVASCULAR: S1, S2 normal. No murmurs, rubs, or gallops.  ABDOMEN: Soft, non-tender, non-distended. Bowel sounds present. No organomegaly or mass.  EXTREMITIES: No pedal edema, cyanosis, or clubbing.  NEUROLOGIC: Cranial nerves II through XII are intact. Muscle strength 5/5 in all extremities. Sensation intact. Gait not checked.  PSYCHIATRIC: The patient is alert and oriented x 3.  SKIN: No obvious rash, lesion, or ulcer.   DATA REVIEW:   CBC Recent Labs  Lab 02/10/18 0542  WBC 4.9  HGB 9.3*  HCT 28.2*  PLT 220    Chemistries  Recent Labs  Lab 02/05/18 0838 02/06/18 1233  02/10/18 0542  NA 136  --    < > 140  K 3.0*  --    < > 3.5  CL 96*  --    < > 113*  CO2 31  --    < > 24  GLUCOSE 120*  --    < > 101*  BUN 34*  --    < > 14  CREATININE 1.49*  --    < > 1.30*  CALCIUM 8.8*  --    < > 7.5*  MG  --  1.5*  --   --   AST 22  --   --   --   ALT 17  --   --   --   ALKPHOS 69  --   --   --   BILITOT 0.5  --   --   --    < > = values in this interval not displayed.    Cardiac Enzymes Recent Labs  Lab 02/07/18 0419  TROPONINI 37.67*    Microbiology Results  Results for orders placed or performed during the hospital encounter of 02/05/18  MRSA PCR Screening     Status: None   Collection Time: 02/08/18 10:11 AM  Result Value Ref Range Status   MRSA by PCR NEGATIVE NEGATIVE Final    Comment:        The GeneXpert MRSA Assay (FDA approved for NASAL  specimens only), is one component of a comprehensive MRSA colonization surveillance program. It is not intended to diagnose MRSA infection nor to guide or monitor treatment for MRSA infections. Performed at [facility], [address] [place],  [place] [address]     RADIOLOGY:  No results found.  EKG:   Orders placed or performed during the hospital encounter of 02/05/18  . ED EKG within 10 minutes  . ED EKG within 10 minutes  . EKG 12-Lead  . EKG 12-Lead  . EKG 12-Lead immediately post procedure  . EKG 12-Lead  . EKG 12-Lead immediately post procedure  . EKG 12-Lead      Management plans discussed with the patient, family and they are in agreement.  CODE STATUS:     Code Status Orders  (From admission, onward)        Start     Ordered   02/07/18 1018  Do not attempt resuscitation (DNR)  Continuous    Question Answer Comment  In the event of cardiac or respiratory ARREST Do not call a "code blue"   In the event of cardiac or respiratory ARREST Do not perform Intubation, CPR, defibrillation or ACLS   In the event of cardiac or respiratory ARREST Use medication by any route, position, wound care, and other measures to relive pain and suffering. May use oxygen, suction and manual treatment of airway obstruction as needed for comfort.      02/07/18 1017    Code Status History    Date Active Date Inactive Code Status Order ID Comments User Context   02/05/2018 1300 02/07/2018 1017 DNR [id number]  Fritzi Mandes, MD Inpatient   11/20/2016 2135 11/23/2016 2151 DNR [id number]  Idelle Crouch, MD Inpatient   10/16/2016 2358 10/23/2016 5427 DNR [id number]  Lance Coon, MD Inpatient      TOTAL TIME TAKING CARE OF THIS PATIENT: 45 minutes.    Avel Peace Koraima Albertsen M.D on 02/10/2018 at 11:24 AM  Between 7am to 6pm - Pager - [phone]  After 6pm go to [email or web address] - password [email or web address] [facility]  Office  [phone]  CC: Primary care  physician; [name], MD   Note: This dictation was prepared with Dragon dictation along with smaller phrase technology. Any transcriptional errors that result from this process are unintentional.

## 2018-02-10 NOTE — Progress Notes (Addendum)
Called report Early Osmond RN [facility]. Pt has a foley for acute urinary retention and doctor Salary okay to just leave it on and let the following physician to do a voiding trial. Director Lutricia Horsfall (Sylvia) was notified and states they can do it at the facility.

## 2018-02-10 NOTE — Progress Notes (Deleted)
Pt complaints of neck pain 10 out 10 oxycodone was given.  Update (1134): pt states Neck pain is down to 6 now. Administer PRN tylenol 650 mg. Will continue to monitor.

## 2018-02-13 ENCOUNTER — Telehealth: Payer: Self-pay | Admitting: *Deleted

## 2018-02-13 NOTE — Telephone Encounter (Signed)
[employer] and asked about if pt had started back on Zytiga and prednisone.  Johnathan Gould told me that wife brought the pills over today and he started it today and he does have prednisone 5 mg bid. I asked Johnathan Gould about how long he is there for. She does not know. She suggests to call in about 1-2 weeks and they may have a better idea of low long  He will be there.

## 2018-02-24 ENCOUNTER — Encounter: Payer: Self-pay | Admitting: *Deleted

## 2018-02-24 ENCOUNTER — Other Ambulatory Visit: Payer: Self-pay | Admitting: Oncology

## 2018-02-24 DIAGNOSIS — C7951 Secondary malignant neoplasm of bone: Principal | ICD-10-CM

## 2018-02-24 DIAGNOSIS — C61 Malignant neoplasm of prostate: Secondary | ICD-10-CM

## 2018-02-24 MED FILL — predniSONE 5 MG TABS: 5 | 30 days supply | Qty: 60 | Fill #3

## 2018-02-24 MED FILL — ZYTIGA 250 MG TABLET: 250 | 30 days supply | Qty: 120 | Fill #0

## 2018-03-03 ENCOUNTER — Inpatient Hospital Stay: Payer: Medicare Other

## 2018-03-03 ENCOUNTER — Inpatient Hospital Stay: Payer: Medicare Other | Attending: Oncology | Admitting: Oncology

## 2018-03-03 ENCOUNTER — Encounter: Payer: Self-pay | Admitting: Oncology

## 2018-03-03 VITALS — BP 103/72 | HR 92 | Temp 99.0°F | Resp 18 | Ht 68.0 in | Wt 183.3 lb

## 2018-03-03 DIAGNOSIS — M8580 Other specified disorders of bone density and structure, unspecified site: Secondary | ICD-10-CM | POA: Diagnosis not present

## 2018-03-03 DIAGNOSIS — C7951 Secondary malignant neoplasm of bone: Secondary | ICD-10-CM | POA: Diagnosis present

## 2018-03-03 DIAGNOSIS — C61 Malignant neoplasm of prostate: Secondary | ICD-10-CM

## 2018-03-03 DIAGNOSIS — Z79899 Other long term (current) drug therapy: Secondary | ICD-10-CM

## 2018-03-03 DIAGNOSIS — R5382 Chronic fatigue, unspecified: Secondary | ICD-10-CM | POA: Insufficient documentation

## 2018-03-03 DIAGNOSIS — G893 Neoplasm related pain (acute) (chronic): Secondary | ICD-10-CM

## 2018-03-03 LAB — PSA: Prostatic Specific Antigen: 0.02 ng/mL (ref 0.00–4.00)

## 2018-03-03 LAB — CBC WITH DIFFERENTIAL/PLATELET
Basophils Absolute: 0.1 10*3/uL (ref 0–0.1)
Basophils Relative: 1 %
EOS ABS: 0 10*3/uL (ref 0–0.7)
EOS PCT: 0 %
HCT: 35.6 % — ABNORMAL LOW (ref 40.0–52.0)
Hemoglobin: 11.8 g/dL — ABNORMAL LOW (ref 13.0–18.0)
Lymphocytes Relative: 7 %
Lymphs Abs: 0.8 10*3/uL — ABNORMAL LOW (ref 1.0–3.6)
MCH: 29.2 pg (ref 26.0–34.0)
MCHC: 33.2 g/dL (ref 32.0–36.0)
MCV: 88 fL (ref 80.0–100.0)
MONO ABS: 0.5 10*3/uL (ref 0.2–1.0)
MONOS PCT: 5 %
Neutro Abs: 10 10*3/uL — ABNORMAL HIGH (ref 1.4–6.5)
Neutrophils Relative %: 87 %
PLATELETS: 273 10*3/uL (ref 150–440)
RBC: 4.05 MIL/uL — ABNORMAL LOW (ref 4.40–5.90)
RDW: 15.7 % — ABNORMAL HIGH (ref 11.5–14.5)
WBC: 11.4 10*3/uL — ABNORMAL HIGH (ref 3.8–10.6)

## 2018-03-03 LAB — COMPREHENSIVE METABOLIC PANEL
ALBUMIN: 3.5 g/dL (ref 3.5–5.0)
ALK PHOS: 71 U/L (ref 38–126)
ALT: 19 U/L (ref 17–63)
AST: 21 U/L (ref 15–41)
Anion gap: 12 (ref 5–15)
BUN: 48 mg/dL — AB (ref 6–20)
CO2: 27 mmol/L (ref 22–32)
CREATININE: 1.73 mg/dL — AB (ref 0.61–1.24)
Calcium: 9.1 mg/dL (ref 8.9–10.3)
Chloride: 95 mmol/L — ABNORMAL LOW (ref 101–111)
GFR calc Af Amer: 41 mL/min — ABNORMAL LOW (ref >60)
GFR calc non Af Amer: 36 mL/min — ABNORMAL LOW (ref >60)
Glucose, Bld: 150 mg/dL — ABNORMAL HIGH (ref 65–99)
Potassium: 3.5 mmol/L (ref 3.5–5.1)
SODIUM: 134 mmol/L — AB (ref 135–145)
Total Bilirubin: 0.5 mg/dL (ref 0.3–1.2)
Total Protein: 7.3 g/dL (ref 6.5–8.1)

## 2018-03-03 NOTE — Progress Notes (Signed)
patient c/o weakness and whole body pain (10). Patient also c/o of dizziness with standing up

## 2018-03-03 NOTE — Progress Notes (Signed)
Hematology/Oncology Consult note [facility]  [phone] [phone]  Patient Care Team: [name], MD as PCP - General (Internal Medicine)   Name of the patient: Johnathan Gould  [id number]  June 25, 1935   Date of visit: 03/03/18  Diagnosis- 1. 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 Anemia of chronic kidney disease   Chief complaint/ Reason for visit- routine f/u of prostate cancer and post hospital discharge follow up  Heme/Onc history: patient is a 83 year old male was recently admitted to the hospital on 10/16/2016 with symptoms of left hip pain and renal failure. He was found to have bilateral hydronephrosis and is status post bilateral nephrostomy tube placement. CT abdomen showed an irregular polypoid mass at the posterior aspect of the bladder highly concerning for bladder carcinoma. Extensive bulky adenopathy throughout the abdomen and pelvis consistent with nodal metastases. Widespread osseous metastatic disease. Bone scan showed multifocal areas of increased activity throughout the pelvis, bilateral femurs, rib cage, right scapula, thoracic cervical and lumbar spine consistent with metastatic disease. Patient was found to have enlarged abnormal prostate as well as an elevated PSA of 469 and was presumptively diagnosed with metastatic prostate cancer. He was started on Firmagon by urology. He was also seen by radiation oncology and started palliative radiation to his left hip on 11/04/2016 and finished 10 doses of palliative Rt.  He is currently on lupron through usevery 3 months  4 core biopsy obtained for tissue diagnosis which showed: prostate adenocarcinoma gleasons 9 and 10 in all 4 cores  zytiga started in March 2018. He is on fosamax for his osteoporosis  Patient was admitted to the hospital on 02/05/18 with worsening epigastric pain. He was subsequently found to have acute MI Two-vessel coronary artery  disease, with 80% stenosis proximal LAD, occluded mid left circumflex, subtotal occlusion OM1, and patent stent mid RCA and underwent DES placement.  Interval history-patient was at [facility] for about 12 days after he was discharged from the hospital.  He is now back to his home.  He reports that he is leg swelling is significantly better.  Does have chronic fatigue and low back pain which is unchanged.  He sometimes feels lightheaded when he stands up.  Denies any loss of consciousness or falls.  Reports that his reflux symptoms are better as he has stopped taking his Fosamax  ECOG PS- 2 Pain scale- 4 Opioid associated constipation- no  Review of systems- Review of Systems  Constitutional: Positive for malaise/fatigue. Negative for chills, fever and weight loss.  HENT: Negative for congestion, ear discharge and nosebleeds.   Eyes: Negative for blurred vision.  Respiratory: Negative for cough, hemoptysis, sputum production, shortness of breath and wheezing.   Cardiovascular: Negative for chest pain, palpitations, orthopnea and claudication.  Gastrointestinal: Negative for abdominal pain, blood in stool, constipation, diarrhea, heartburn, melena, nausea and vomiting.  Genitourinary: Negative for dysuria, flank pain, frequency, hematuria and urgency.  Musculoskeletal: Positive for back pain. Negative for joint pain and myalgias.  Skin: Negative for rash.  Neurological: Negative for dizziness, tingling, focal weakness, seizures, weakness and headaches.  Endo/Heme/Allergies: Does not bruise/bleed easily.  Psychiatric/Behavioral: Negative for depression and suicidal ideas. The patient does not have insomnia.       No Known Allergies   Past Medical History:  Diagnosis Date  . Arthritis   . CAD (coronary artery disease)   . Hypertension   . Prostate cancer ([place])    metastatic   . RAD (reactive airway  disease)      Past Surgical History:  Procedure Laterality Date  . CORONARY  ANGIOPLASTY WITH STENT PLACEMENT    . CORONARY STENT INTERVENTION N/A 02/07/2018   Procedure: CORONARY STENT INTERVENTION;  Surgeon: Isaias Cowman, MD;  Location: [facility];  Service: Cardiovascular;  Laterality: N/A;  . IR GENERIC HISTORICAL  10/17/2016   IR NEPHROSTOMY PLACEMENT RIGHT 10/17/2016 [facility]  . IR GENERIC HISTORICAL  10/17/2016   IR NEPHROSTOMY PLACEMENT LEFT 10/17/2016 [name], MD [facility]  . IR GENERIC HISTORICAL  12/23/2016   IR NEPHROSTOMY EXCHANGE LEFT 12/23/2016 [facility]  . IR GENERIC HISTORICAL  12/23/2016   IR NEPHROSTOMY EXCHANGE RIGHT 12/23/2016 [facility]  . IR NEPHROSTOMY EXCHANGE LEFT  02/11/2017  . IR NEPHROSTOMY EXCHANGE RIGHT  02/11/2017  . KNEE SURGERY Left 2006   Laparascopy done on left knee, scar tissue taken out  . LEFT HEART CATH AND CORONARY ANGIOGRAPHY N/A 02/07/2018   Procedure: LEFT HEART CATH AND CORONARY ANGIOGRAPHY;  Surgeon: Isaias Cowman, MD;  Location: [facility];  Service: Cardiovascular;  Laterality: N/A;  . nephrostomy tubes Bilateral     Social History   Socioeconomic History  . Marital status: Married    Spouse name: Not on file  . Number of children: Not on file  . Years of education: Not on file  . Highest education level: Not on file  Occupational History  . Not on file  Social Needs  . Financial resource strain: Not on file  . Food insecurity:    Worry: Not on file    Inability: Not on file  . Transportation needs:    Medical: Not on file    Non-medical: Not on file  Tobacco Use  . Smoking status: Former Smoker    Packs/day: 1.00    Years: 69.50    Pack years: 69.50    Last attempt to quit: 11/04/2014    Years since quitting: 3.3  . Smokeless tobacco: Never Used  Substance and Sexual Activity  . Alcohol use: No  . Drug use: No  . Sexual activity: Yes  Lifestyle  . Physical activity:    Days per week: Not on file    Minutes per session: Not on file  .  Stress: Not on file  Relationships  . Social connections:    Talks on phone: Not on file    Gets together: Not on file    Attends religious service: Not on file    Active member of club or organization: Not on file    Attends meetings of clubs or organizations: Not on file    Relationship status: Not on file  . Intimate partner violence:    Fear of current or ex partner: Not on file    Emotionally abused: Not on file    Physically abused: Not on file    Forced sexual activity: Not on file  Other Topics Concern  . Not on file  Social History Narrative  . Not on file    Family History  Problem Relation Age of Onset  . Hypertension Other   . Lung cancer Brother      Current Outpatient Medications:  .  alendronate (FOSAMAX) 70 MG tablet, Take 1 tablet (70 mg total) by mouth once a week. Take with a full glass of water on an empty stomach., Disp: 4 tablet, Rfl: 6 .  aspirin EC 81 MG tablet, Take 81 mg by mouth daily. , Disp: , Rfl:  .  atorvastatin (LIPITOR) 40 MG tablet, Take 1 tablet (40 mg total) by mouth daily at 6 PM., Disp: 30 tablet, Rfl: 0 .  budesonide-formoterol (SYMBICORT) 160-4.5 MCG/ACT inhaler, Inhale 2 puffs into the lungs 2 (two) times daily., Disp: 1 Inhaler, Rfl: 0 .  fentaNYL (DURAGESIC - DOSED MCG/HR) 12 MCG/HR, Place 1 patch (12.5 mcg total) onto the skin every 3 (three) days., Disp: 10 patch, Rfl: 0 .  gabapentin (NEURONTIN) 100 MG capsule, Take 1 capsule (100 mg total) by mouth at bedtime., Disp: 30 capsule, Rfl: 0 .  ipratropium-albuterol (DUONEB) 0.5-2.5 (3) MG/3ML SOLN, Take 3 mLs by nebulization every 6 (six) hours as needed (sob, wheezing)., Disp: 360 mL, Rfl: 0 .  metoprolol (LOPRESSOR) 50 MG tablet, Take 50 mg by mouth 2 (two) times daily. , Disp: , Rfl:  .  nitroGLYCERIN (NITROSTAT) 0.4 MG SL tablet, Place 0.4 mg under the tongue every 5 (five) minutes x 3 doses as needed for chest pain., Disp: , Rfl:  .  oxyCODONE (OXY IR/ROXICODONE) 5 MG immediate  release tablet, Take 3 tablets (15 mg total) by mouth every 6 (six) hours as needed for severe pain., Disp: 20 tablet, Rfl: 0 .  pantoprazole (PROTONIX) 40 MG tablet, Take 1 tablet (40 mg total) by mouth 2 (two) times daily., Disp: 60 tablet, Rfl: 0 .  predniSONE (DELTASONE) 5 MG tablet, Take 1 tablet (5 mg total) by mouth 2 (two) times daily with a meal., Disp: 60 tablet, Rfl: 5 .  sennosides-docusate sodium (SENOKOT-S) 8.6-50 MG tablet, Take 2 tablets by mouth 2 (two) times daily. , Disp: , Rfl:  .  sucralfate (CARAFATE) 1 GM/10ML suspension, Take 10 mLs (1 g total) by mouth 4 (four) times daily -  with meals and at bedtime., Disp: 420 mL, Rfl: 0 .  tamsulosin (FLOMAX) 0.4 MG CAPS capsule, Take 1 capsule (0.4 mg total) by mouth daily., Disp: 30 capsule, Rfl: 11 .  ticagrelor (BRILINTA) 90 MG TABS tablet, Take 1 tablet (90 mg total) by mouth 2 (two) times daily., Disp: 60 tablet, Rfl: 0 .  ZYTIGA 250 MG tablet, TAKE 4 TABLETS (1,000 MG TOTAL) BY MOUTH DAILY. TAKE ON AN EMPTY STOMACH 1 HOUR BEFORE OR 2 HOURS AFTER A MEAL, Disp: 120 tablet, Rfl: 2  Physical exam:  Vitals:   03/03/18 1406  BP: 103/72  Pulse: 92  Resp: 18  Temp: 99 F (37.2 C)  TempSrc: Tympanic  SpO2: 96%  Weight: 183 lb 4.8 oz (83.1 kg)  Height: 5\' 8"  (1.727 m)   Physical Exam  Constitutional: He is oriented to person, place, and time.  HENT:  Head: Normocephalic and atraumatic.  Eyes: Pupils are equal, round, and reactive to light. EOM are normal.  Neck: Normal range of motion.  Cardiovascular: Normal rate, regular rhythm and normal heart sounds.  Pulmonary/Chest: Effort normal and breath sounds normal.  Abdominal: Soft. Bowel sounds are normal.  Neurological: He is alert and oriented to person, place, and time.  Skin: Skin is warm and dry.     CMP Latest Ref Rng & Units 02/10/2018  Glucose 65 - 99 mg/dL 101(H)  BUN 6 - 20 mg/dL 14  Creatinine 0.61 - 1.24 mg/dL 1.30(H)  Sodium 135 - 145 mmol/L 140  Potassium  3.5 - 5.1 mmol/L 3.5  Chloride 101 - 111 mmol/L 113(H)  CO2 22 - 32 mmol/L 24  Calcium 8.9 - 10.3 mg/dL 7.5(L)  Total Protein 6.5 - 8.1 g/dL -  Total Bilirubin 0.3 - 1.2 mg/dL -  Alkaline Phos 38 - 126 U/L -  AST 15 - 41 U/L -  ALT 17 - 63 U/L -   CBC Latest Ref Rng & Units 03/03/2018  WBC 3.8 - 10.6 K/uL 11.4(H)  Hemoglobin 13.0 - 18.0 g/dL 11.8(L)  Hematocrit 40.0 - 52.0 % 35.6(L)  Platelets 150 - 440 K/uL 273    No images are attached to the encounter.  Dg Chest 1 View  Result Date: 02/06/2018 CLINICAL DATA:  Hypoxia EXAM: CHEST  1 VIEW COMPARISON:  CT 02/05/2018, radiograph 02/05/2018 FINDINGS: Patient rotation exaggerates the cardiomediastinal silhouette. Streaky atelectasis at the left base. No pleural effusion. Aortic atherosclerosis. No pneumothorax. IMPRESSION: Streaky atelectasis at the left base. Exaggerated cardiomediastinal silhouette secondary to patient rotation. Electronically Signed   By: Donavan Foil M.D.   On: 02/06/2018 20:31   Dg Chest 2 View  Result Date: 02/05/2018 CLINICAL DATA:  83 year old male with severe esophageal pain EXAM: CHEST - 2 VIEW COMPARISON:  Prior chest x-ray 11/20/2016 FINDINGS: Stable cardiac and mediastinal contours. The thoracic aorta is ectatic, tortuous and atherosclerotic. Stable background of pulmonary hyperinflation, bronchitic changes and interstitial prominence. No focal airspace consolidation, pulmonary edema, pleural effusion or pneumothorax. IMPRESSION: Stable chest x-ray without evidence of acute cardiopulmonary process. Electronically Signed   By: Jacqulynn Cadet M.D.   On: 02/05/2018 09:14   Ct Angio Chest/abd/pel For Dissection W And/or Wo Contrast  Result Date: 02/05/2018 CLINICAL DATA:  83 year old male with stage IV prostate cancer and chest pain concerning for possible aortic dissection. EXAM: CT ANGIOGRAPHY CHEST, ABDOMEN AND PELVIS TECHNIQUE: Multidetector CT imaging through the chest, abdomen and pelvis was performed using the  standard protocol during bolus administration of intravenous contrast. Multiplanar reconstructed images and MIPs were obtained and reviewed to evaluate the vascular anatomy. CONTRAST:  31mL OMNIPAQUE IOHEXOL 350 MG/ML SOLN COMPARISON:  Prior CT scan of the abdomen and pelvis 01/09/2018; prior CT scan of the chest 10/21/2017 FINDINGS: CTA CHEST FINDINGS Cardiovascular: No evidence of acute intramural hematoma on the initial noncontrast enhanced images. Following administration of intravenous contrast, there is excellent opacification of the thoracic aorta and its branch vessels. Conventional 3 vessel arch anatomy. The aorta is normal in caliber. No evidence of aneurysm, dissection or penetrating atherosclerotic ulcer. Heterogeneous atherosclerotic plaque is present. The cardiac structures are normal in size. Calcifications are present along the course of the coronary arteries. No pericardial effusion. Unremarkable main and central pulmonary arteries. Mediastinum/Nodes: Unremarkable CT appearance of the thyroid gland. No suspicious mediastinal or hilar adenopathy. No soft tissue mediastinal mass. The thoracic esophagus is unremarkable. Lungs/Pleura: Lungs are clear. Small calcified granuloma affiliated with the minor fissure. No pleural effusion or pneumothorax. Musculoskeletal: Extensive scattered sclerotic foci consistent with known metastatic prostate cancer. No significant interval change compared to prior imaging. Review of the MIP images confirms the above findings. CTA ABDOMEN AND PELVIS FINDINGS VASCULAR Aorta: Scattered heterogeneous atherosclerotic plaque. No evidence of aneurysm, dissection or significant penetrating ulceration. Celiac: Ectasia of the celiac axis with a maximal transverse diameter of 1.2 cm. This is unchanged compared to prior imaging. SMA: Patent without evidence of aneurysm, dissection, vasculitis or significant stenosis. Renals: Solitary renal arteries bilaterally. There is a focal  moderate stenosis at the origin of the right renal artery and a focal moderate to high-grade stenosis at the origin of the left renal artery both secondary to predominantly fibrofatty atherosclerotic plaque. No changes of fibromuscular dysplasia. IMA: Patent without evidence of aneurysm, dissection, vasculitis or significant stenosis. Inflow: Patent without evidence of  aneurysm, dissection, vasculitis or significant stenosis. Veins: No obvious venous abnormality within the limitations of this arterial phase study. Review of the MIP images confirms the above findings. NON-VASCULAR Hepatobiliary: Normal hepatic contour and morphology. No discrete hepatic lesions. Normal appearance of the gallbladder. No intra or extrahepatic biliary ductal dilatation. Pancreas: Unremarkable. No pancreatic ductal dilatation or surrounding inflammatory changes. Spleen: Normal in size without focal abnormality. Adrenals/Urinary Tract: Normal adrenal glands. No evidence of hydronephrosis, nephrolithiasis or enhancing renal mass. The ureters and bladder are unremarkable. Stomach/Bowel: Colonic diverticular disease without CT evidence of active inflammation. No evidence of obstruction or focal bowel wall thickening. Normal appendix in the right lower quadrant. The terminal ileum is unremarkable. Small duodenum diverticulum. Lymphatic: No suspicious lymphadenopathy. Reproductive: Prostate is unremarkable. Other: No abdominal wall hernia or abnormality. No abdominopelvic ascites. Musculoskeletal: Stable appearance of diffuse multifocal sclerotic bony lesions consistent with patient's known history of metastatic prostate cancer. Lumbar degenerative disc disease and facet arthropathy. Review of the MIP images confirms the above findings. IMPRESSION: CTA CHEST 1. No acute aortic vascular abnormality. 2. Coronary artery calcifications. 3.  Aortic Atherosclerosis (ICD10-170.0) 4. Stable osseous metastatic disease. CTA ABD/PELVIS 1. No acute aortic  vascular abnormality. 2. Bilateral renal artery stenosis, moderate on the right and advanced on the left. 3.  Aortic Atherosclerosis (ICD10-170.0) 4. Colonic diverticular disease without CT evidence of active inflammation. 5. Widespread osseous metastatic disease. 6. Lumbar degenerative disc disease and facet arthropathy. Signed, Criselda Peaches, MD [facility] Radiology Electronically Signed   By: Jacqulynn Cadet M.D.   On: 02/05/2018 10:56     Assessment and plan- Patient is a 83 y.o. male with castrate sensitive prostate cancer currently on Zytiga and prednisone here for routine follow-up of prostate cancer.  Reports that his reflux symptoms are better since he has stopped taking Fosamax patient will stop taking his Fosamax which she was taking for osteopenia while he was on ADT.   Castrate sensitive prostate cancer: I reviewed the results of CT chest abdomen and pelvis which were performed when patient was admitted to the hospital.  There was no evidence of metastatic disease in the chest.  Retroperitoneal adenopathy was stable to improved and now only sub-centimeters in size.  Bone mets appeared stable.  I will plan to get a bone scan next month.  His PSA has reached an order of 0.02 and levels from today are pending.  He will continue Zytiga and prednisone at this time  He will continue to hold his fosamax which he was taking for his osteopenia in the setting of ADT  AKI on CKD- probably secondary to hsopitalization/ cardaic cath/ contrast nephropathy. Continue to monitor  Repeat cbc/cmp/psa in 6 weeks and see md  Neoplasm related pain- oxycodone will be renewed today   Visit Diagnosis 1. Prostate cancer metastatic to bone ([place])   2. High risk medication use   3. Neoplasm related pain      Dr. Randa Evens, MD, MPH [facility] [phone] 03/03/2018 2:08 PM

## 2018-03-04 ENCOUNTER — Other Ambulatory Visit: Payer: Self-pay | Admitting: Oncology

## 2018-03-04 MED ORDER — OXYCODONE HCL 5 MG PO TABS: 15.0000 mg | ORAL_TABLET | Freq: Four times a day (QID) | ORAL | 0 refills | Status: DC | PRN

## 2018-03-22 MED FILL — predniSONE 5 MG TABS: 5 | 30 days supply | Qty: 60 | Fill #4

## 2018-03-22 MED FILL — ZYTIGA 250 MG TABLET: 250 | 30 days supply | Qty: 120 | Fill #1

## 2018-04-03 ENCOUNTER — Ambulatory Visit
Admission: RE | Admit: 2018-04-03 | Discharge: 2018-04-03 | Disposition: A | Discharge status: Home / Self Care | Payer: Medicare Other | Source: Ambulatory Visit | Attending: Oncology | Admitting: Oncology

## 2018-04-03 DIAGNOSIS — C61 Malignant neoplasm of prostate: Secondary | ICD-10-CM

## 2018-04-03 DIAGNOSIS — C7951 Secondary malignant neoplasm of bone: Secondary | ICD-10-CM | POA: Diagnosis present

## 2018-04-03 MED ORDER — TECHNETIUM TC 99M MEDRONATE IV KIT
23.6400 | PACK | Freq: Once | INTRAVENOUS | Status: AC | PRN
  Administered 2018-04-03: 23.64 via INTRAVENOUS

## 2018-04-04 ENCOUNTER — Ambulatory Visit: Payer: Medicare Other | Admitting: Urology

## 2018-04-04 ENCOUNTER — Encounter: Payer: Self-pay | Admitting: Urology

## 2018-04-04 VITALS — BP 146/88 | HR 108 | Resp 16 | Ht 68.0 in | Wt 187.8 lb

## 2018-04-04 DIAGNOSIS — N138 Other obstructive and reflux uropathy: Secondary | ICD-10-CM | POA: Diagnosis not present

## 2018-04-04 DIAGNOSIS — N403 Nodular prostate with lower urinary tract symptoms: Secondary | ICD-10-CM

## 2018-04-04 DIAGNOSIS — C7951 Secondary malignant neoplasm of bone: Secondary | ICD-10-CM | POA: Diagnosis not present

## 2018-04-04 DIAGNOSIS — C61 Malignant neoplasm of prostate: Secondary | ICD-10-CM | POA: Diagnosis not present

## 2018-04-04 LAB — BLADDER SCAN AMB NON-IMAGING

## 2018-04-04 NOTE — Progress Notes (Signed)
04/04/2018 9:17 AM   Johnathan Gould [birth year] [id number]  Referring provider: [name], MD [address] [place], [place] [address]  Chief Complaint  Patient presents with  . Prostate Cancer    HPI: 83 year old male with with metastatic prostate cancer who returns today for routine follow-up.    Since his last visit, he was admitted with an NSTEMI discharged on 02/10/2018.  He is slowly improving.  He initially presented to the emergency room on 10/16/2016 with severe left hip pain and renal failure. He is on bilateral hydronephrosis, extensive bulky adenopathy threat the abdomen pelvis, locally advanced prostate cancer, and widespread osseous metastatic disease. At the time of presentation his PSA was 469. He underwent placement of bilateral nephrostomy tubes and his creatinine improved.  As he responded to ADT, he underwent clamping trials and ultimately his nephrostomy tubes were removed.  He is now on ADT and has been seen and evaluated by Dr. [name] who has started him on the Zytiga/ prednisone in addition to lupron.    He did also receive 10 fractions of palliative radiation to his right hip.  Most recent PSA 0.02 down from 469 as of 03/2018.  Most recent imaging in the form of bone scan yesterday showed multifocal osseous metastatic disease but stable since 10/2017.  No evidence of urinary retention or bilateral hydronephrosis on CT angios chest abdomen pelvis on 02/05/2018.  Voiding fairly well today, IPSS as below, 9/2.  He has no urinary complaints.  No flank pain.  No gross hematuria.  IPSS    Row Name 04/04/18 0800         International Prostate Symptom Score   How often have you had the sensation of not emptying your bladder?  Not at All     How often have you had to urinate less than every two hours?  Almost always     How often have you found you stopped and started again several times when you urinated?  Not at All     How often have you found it difficult to  postpone urination?  Not at All     How often have you had a weak urinary stream?  Not at All     How often have you had to strain to start urination?  Not at All     How many times did you typically get up at night to urinate?  4 Times     Total IPSS Score  9       Quality of Life due to urinary symptoms   If you were to spend the rest of your life with your urinary condition just the way it is now how would you feel about that?  Mostly Satisfied        Score:  1-7 Mild 8-19 Moderate 20-35 Severe   PMH: Past Medical History:  Diagnosis Date  . Arthritis   . CAD (coronary artery disease)   . Hypertension   . Prostate cancer ([place])    metastatic   . RAD (reactive airway disease)     Surgical History: Past Surgical History:  Procedure Laterality Date  . CORONARY ANGIOPLASTY WITH STENT PLACEMENT    . CORONARY STENT INTERVENTION N/A 02/07/2018   Procedure: CORONARY STENT INTERVENTION;  Surgeon: Isaias Cowman, MD;  Location: [facility];  Service: Cardiovascular;  Laterality: N/A;  . IR GENERIC HISTORICAL  10/17/2016   IR NEPHROSTOMY PLACEMENT RIGHT 10/17/2016 [facility]  . IR GENERIC HISTORICAL  10/17/2016  IR NEPHROSTOMY PLACEMENT LEFT 10/17/2016 [name], MD [facility]  . IR GENERIC HISTORICAL  12/23/2016   IR NEPHROSTOMY EXCHANGE LEFT 12/23/2016 [facility]  . IR GENERIC HISTORICAL  12/23/2016   IR NEPHROSTOMY EXCHANGE RIGHT 12/23/2016 [facility]  . IR NEPHROSTOMY EXCHANGE LEFT  02/11/2017  . IR NEPHROSTOMY EXCHANGE RIGHT  02/11/2017  . KNEE SURGERY Left 2006   Laparascopy done on left knee, scar tissue taken out  . LEFT HEART CATH AND CORONARY ANGIOGRAPHY N/A 02/07/2018   Procedure: LEFT HEART CATH AND CORONARY ANGIOGRAPHY;  Surgeon: Isaias Cowman, MD;  Location: [facility];  Service: Cardiovascular;  Laterality: N/A;  . nephrostomy tubes Bilateral     Home Medications:  Allergies as of 04/04/2018   No Known  Allergies     Medication List        Accurate as of 04/04/18  9:17 AM. Always use your most recent med list.          alendronate 70 MG tablet Commonly known as:  FOSAMAX Take 1 tablet (70 mg total) by mouth once a week. Take with a full glass of water on an empty stomach.   aspirin EC 81 MG tablet Take 81 mg by mouth daily.   atorvastatin 40 MG tablet Commonly known as:  LIPITOR Take 1 tablet (40 mg total) by mouth daily at 6 PM.   budesonide-formoterol 160-4.5 MCG/ACT inhaler Commonly known as:  SYMBICORT Inhale 2 puffs into the lungs 2 (two) times daily.   clopidogrel 75 MG tablet Commonly known as:  PLAVIX Take 75 mg by mouth daily.   fentaNYL 12 MCG/HR Commonly known as:  DURAGESIC - dosed mcg/hr Place 1 patch (12.5 mcg total) onto the skin every 3 (three) days.   gabapentin 100 MG capsule Commonly known as:  NEURONTIN Take 1 capsule (100 mg total) by mouth at bedtime.   ipratropium-albuterol 0.5-2.5 (3) MG/3ML Soln Commonly known as:  DUONEB Take 3 mLs by nebulization every 6 (six) hours as needed (sob, wheezing).   metoprolol tartrate 50 MG tablet Commonly known as:  LOPRESSOR Take 50 mg by mouth 2 (two) times daily.   nitroGLYCERIN 0.4 MG SL tablet Commonly known as:  NITROSTAT Place 0.4 mg under the tongue every 5 (five) minutes x 3 doses as needed for chest pain.   ondansetron 4 MG tablet Commonly known as:  ZOFRAN   pantoprazole 40 MG tablet Commonly known as:  PROTONIX Take 1 tablet (40 mg total) by mouth 2 (two) times daily.   predniSONE 5 MG tablet Commonly known as:  DELTASONE Take 1 tablet (5 mg total) by mouth 2 (two) times daily with a meal.   sennosides-docusate sodium 8.6-50 MG tablet Commonly known as:  SENOKOT-S Take 2 tablets by mouth 2 (two) times daily.   sucralfate 1 GM/10ML suspension Commonly known as:  CARAFATE Take 10 mLs (1 g total) by mouth 4 (four) times daily -  with meals and at bedtime.   tamsulosin 0.4 MG Caps  capsule Commonly known as:  FLOMAX Take 1 capsule (0.4 mg total) by mouth daily.   ticagrelor 90 MG Tabs tablet Commonly known as:  BRILINTA Take 1 tablet (90 mg total) by mouth 2 (two) times daily.   ZYTIGA 250 MG tablet Generic drug:  abiraterone acetate TAKE 4 TABLETS (1,000 MG TOTAL) BY MOUTH DAILY. TAKE ON AN EMPTY STOMACH 1 HOUR BEFORE OR 2 HOURS AFTER A MEAL       Allergies: No Known Allergies  Family  History: Family History  Problem Relation Age of Onset  . Hypertension Other   . Lung cancer Brother     Social History:  reports that he quit smoking about 3 years ago. He has a 69.50 pack-year smoking history. He has never used smokeless tobacco. He reports that he does not drink alcohol or use drugs.  ROS: UROLOGY Frequent Urination?: No Hard to postpone urination?: No Burning/pain with urination?: No Get up at night to urinate?: No Leakage of urine?: No Urine stream starts and stops?: No Trouble starting stream?: No Do you have to strain to urinate?: No Blood in urine?: No Urinary tract infection?: No Sexually transmitted disease?: No Injury to kidneys or bladder?: No Painful intercourse?: No Weak stream?: No Erection problems?: No Penile pain?: No  Gastrointestinal Nausea?: No Vomiting?: No Indigestion/heartburn?: No Diarrhea?: No Constipation?: No  Constitutional Fever: No Night sweats?: No Weight loss?: No Fatigue?: No  Skin Skin rash/lesions?: No Itching?: No  Eyes Blurred vision?: No Double vision?: No  Ears/Nose/Throat Sore throat?: No Sinus problems?: No  Hematologic/Lymphatic Swollen glands?: No Easy bruising?: No  Cardiovascular Leg swelling?: No Chest pain?: No  Respiratory Cough?: No Shortness of breath?: No  Endocrine Excessive thirst?: No  Musculoskeletal Back pain?: No Joint pain?: No  Neurological Headaches?: No Dizziness?: No  Psychologic Depression?: No Anxiety?: No  Physical Exam: BP (!) 146/88    Pulse (!) 108   Resp 16   Ht 5\' 8"  (1.727 m)   Wt 187 lb 12.8 oz (85.2 kg)   SpO2 96%   BMI 28.55 kg/m   Constitutional:  Alert and oriented, No acute distress. HEENT: [place] AT, moist mucus membranes.  Trachea midline, no masses. Cardiovascular: No clubbing, cyanosis, or edema. Respiratory: Normal respiratory effort, no increased work of breathing. Skin: No rashes, bruises or suspicious lesions. Neurologic: Grossly intact, no focal deficits, moving all 4 extremities. Psychiatric: Normal mood and affect.  Laboratory Data: Lab Results  Component Value Date   WBC 11.4 (H) 03/03/2018   HGB 11.8 (L) 03/03/2018   HCT 35.6 (L) 03/03/2018   MCV 88.0 03/03/2018   PLT 273 03/03/2018    Lab Results  Component Value Date   CREATININE 1.73 (H) 03/03/2018    Lab Results  Component Value Date   PSA 0.05 03/01/2017   PSA 0.09 02/01/2017   PSA 0.09 01/18/2017    Lab Results  Component Value Date   TESTOSTERONE <3 (L) 04/12/2017    Urinalysis    Component Value Date/Time   COLORURINE YELLOW (A) 01/09/2018 1406   APPEARANCEUR CLEAR (A) 01/09/2018 1406   LABSPEC 1.018 01/09/2018 1406   PHURINE 6.0 01/09/2018 1406   GLUCOSEU NEGATIVE 01/09/2018 1406   HGBUR SMALL (A) 01/09/2018 1406   BILIRUBINUR NEGATIVE 01/09/2018 1406   KETONESUR NEGATIVE 01/09/2018 1406   PROTEINUR NEGATIVE 01/09/2018 1406   NITRITE NEGATIVE 01/09/2018 1406   LEUKOCYTESUR NEGATIVE 01/09/2018 1406    Lab Results  Component Value Date   BACTERIA NONE SEEN 01/09/2018    Pertinent Imaging: Yesterday as well as CT chest abdomen pelvis from 01/2018 personally reviewed  Results for orders placed or performed in visit on 04/04/18  BLADDER SCAN AMB NON-IMAGING  Result Value Ref Range   Scan Result 6ml      Assessment & Plan:    1. Prostate cancer metastatic to bone [facility]) Managed Dr. [name] on Lupron/ Zytiga + pred PSA stably low, no evidence of progression on most recent cross-sectional  imaging Further prostate cancer management to  Dr. [name] for the time being, advised to return if he develops any recurrent obstructive urinary symptoms or recurrence of hydronephrosis on serial imaging  2. 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  Hollice Espy, MD  [facility] [address], [place], [place] [address] [phone]

## 2018-04-13 ENCOUNTER — Telehealth: Payer: Self-pay | Admitting: *Deleted

## 2018-04-13 ENCOUNTER — Inpatient Hospital Stay: Payer: Medicare Other | Attending: Oncology | Admitting: Oncology

## 2018-04-13 ENCOUNTER — Other Ambulatory Visit: Payer: Self-pay

## 2018-04-13 ENCOUNTER — Inpatient Hospital Stay: Payer: Medicare Other

## 2018-04-13 ENCOUNTER — Ambulatory Visit
Admission: RE | Admit: 2018-04-13 | Discharge: 2018-04-13 | Disposition: A | Discharge status: Home / Self Care | Payer: Medicare Other | Source: Ambulatory Visit | Attending: Oncology | Admitting: Oncology

## 2018-04-13 ENCOUNTER — Encounter: Payer: Self-pay | Admitting: Oncology

## 2018-04-13 ENCOUNTER — Telehealth: Payer: Self-pay | Admitting: Pharmacist

## 2018-04-13 VITALS — BP 111/71 | HR 71 | Temp 98.4°F | Resp 18 | Ht 68.0 in | Wt 190.8 lb

## 2018-04-13 DIAGNOSIS — C61 Malignant neoplasm of prostate: Secondary | ICD-10-CM | POA: Insufficient documentation

## 2018-04-13 DIAGNOSIS — M7989 Other specified soft tissue disorders: Secondary | ICD-10-CM

## 2018-04-13 DIAGNOSIS — Z87891 Personal history of nicotine dependence: Secondary | ICD-10-CM | POA: Insufficient documentation

## 2018-04-13 DIAGNOSIS — C7951 Secondary malignant neoplasm of bone: Secondary | ICD-10-CM | POA: Diagnosis not present

## 2018-04-13 DIAGNOSIS — M545 Low back pain: Secondary | ICD-10-CM | POA: Insufficient documentation

## 2018-04-13 DIAGNOSIS — G8929 Other chronic pain: Secondary | ICD-10-CM | POA: Insufficient documentation

## 2018-04-13 DIAGNOSIS — Z79899 Other long term (current) drug therapy: Secondary | ICD-10-CM

## 2018-04-13 LAB — CBC WITH DIFFERENTIAL/PLATELET
BASOS ABS: 0 10*3/uL (ref 0–0.1)
BASOS PCT: 0 %
EOS PCT: 1 %
Eosinophils Absolute: 0.1 10*3/uL (ref 0–0.7)
HCT: 32.6 % — ABNORMAL LOW (ref 40.0–52.0)
Hemoglobin: 10.8 g/dL — ABNORMAL LOW (ref 13.0–18.0)
Lymphocytes Relative: 7 %
Lymphs Abs: 0.6 10*3/uL — ABNORMAL LOW (ref 1.0–3.6)
MCH: 29.4 pg (ref 26.0–34.0)
MCHC: 33.2 g/dL (ref 32.0–36.0)
MCV: 88.6 fL (ref 80.0–100.0)
MONO ABS: 0.5 10*3/uL (ref 0.2–1.0)
Monocytes Relative: 6 %
Neutro Abs: 7.5 10*3/uL — ABNORMAL HIGH (ref 1.4–6.5)
Neutrophils Relative %: 86 %
PLATELETS: 349 10*3/uL (ref 150–440)
RBC: 3.68 MIL/uL — ABNORMAL LOW (ref 4.40–5.90)
RDW: 16.1 % — AB (ref 11.5–14.5)
WBC: 8.7 10*3/uL (ref 3.8–10.6)

## 2018-04-13 LAB — COMPREHENSIVE METABOLIC PANEL
ALBUMIN: 3.4 g/dL — AB (ref 3.5–5.0)
ALK PHOS: 80 U/L (ref 38–126)
ALT: 16 U/L — ABNORMAL LOW (ref 17–63)
AST: 17 U/L (ref 15–41)
Anion gap: 11 (ref 5–15)
BILIRUBIN TOTAL: 0.5 mg/dL (ref 0.3–1.2)
BUN: 27 mg/dL — AB (ref 6–20)
CALCIUM: 9 mg/dL (ref 8.9–10.3)
CO2: 28 mmol/L (ref 22–32)
CREATININE: 1.37 mg/dL — AB (ref 0.61–1.24)
Chloride: 99 mmol/L — ABNORMAL LOW (ref 101–111)
GFR calc Af Amer: 55 mL/min — ABNORMAL LOW (ref >60)
GFR calc non Af Amer: 47 mL/min — ABNORMAL LOW (ref >60)
GLUCOSE: 101 mg/dL — AB (ref 65–99)
Potassium: 3.7 mmol/L (ref 3.5–5.1)
Sodium: 138 mmol/L (ref 135–145)
Total Protein: 6.7 g/dL (ref 6.5–8.1)

## 2018-04-13 LAB — PSA: Prostatic Specific Antigen: 0.02 ng/mL (ref 0.00–4.00)

## 2018-04-13 MED ORDER — FENTANYL 12 MCG/HR TD PT72: 12.5000 ug | MEDICATED_PATCH | TRANSDERMAL | 0 refills | Status: DC

## 2018-04-13 MED ORDER — ONDANSETRON HCL 4 MG PO TABS: 4.0000 mg | ORAL_TABLET | Freq: Three times a day (TID) | ORAL | 0 refills | Status: DC | PRN

## 2018-04-13 NOTE — Telephone Encounter (Signed)
Called pt and let him know that he does not have a blood clot in either leg. Dr. [name] did speak to Dr. Ubaldo Glassing to see if there were any changes in  meds to help with swelling and Dr. Ubaldo Glassing told her he would look and let pt know , it may be an extra visit. Patient agreeable to above

## 2018-04-13 NOTE — Telephone Encounter (Signed)
Oral Chemotherapy Pharmacist Encounter  Follow-Up Form  Called patient today to follow up regarding patient's oral chemotherapy medication: Zytiga/prednisone  Original Start date of oral chemotherapy: 12/2016  Pt reports 0 tablets/doses of Zytiga missed in the last month.  Pt reports the following side effects: None reported  Recent labs reviewed: PSA from 03/03/18  New medications?: None reported  Other Issues: None reported  Patient knows to call the office with questions or concerns. [facility] Clinic will continue to follow.  Darl Pikes, PharmD, BCPS Hematology/Oncology Clinical Pharmacist [facility] [phone]  04/13/2018 11:15 AM

## 2018-04-13 NOTE — Progress Notes (Signed)
Bilateral feet swelling more to the right than left ( up to the knee)

## 2018-04-14 NOTE — Progress Notes (Signed)
Hematology/Oncology Consult note [facility]  [phone] [phone]  Patient Care Team: [name], MD as PCP - General (Internal Medicine)   Name of the patient: Johnathan Gould  [id number]  02/16/1935   Date of visit: 04/14/18  Diagnosis- 1. 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 Anemia of chronic kidney disease    Chief complaint/ Reason for visit- routine f/u of prostate cancer on zytiga  Heme/Onc history: patient is a 83 year old male was recently admitted to the hospital on 10/16/2016 with symptoms of left hip pain and renal failure. He was found to have bilateral hydronephrosis and is status post bilateral nephrostomy tube placement. CT abdomen showed an irregular polypoid mass at the posterior aspect of the bladder highly concerning for bladder carcinoma. Extensive bulky adenopathy throughout the abdomen and pelvis consistent with nodal metastases. Widespread osseous metastatic disease. Bone scan showed multifocal areas of increased activity throughout the pelvis, bilateral femurs, rib cage, right scapula, thoracic cervical and lumbar spine consistent with metastatic disease. Patient was found to have enlarged abnormal prostate as well as an elevated PSA of 469 and was presumptively diagnosed with metastatic prostate cancer. He was started on Firmagon by urology. He was also seen by radiation oncology and started palliative radiation to his left hip on 11/04/2016 and finished 10 doses of palliative Rt.  He is currently on lupron through usevery 3 months  4 core biopsy obtained for tissue diagnosis which showed: prostate adenocarcinoma gleasons 9 and 10 in all 4 cores  zytiga started in March 2018. He is on fosamax for his osteoporosis  Patient was admitted to the hospital on 02/05/18 with worsening epigastric pain. He was subsequently found to have acute MI Two-vessel coronary artery disease, with 80% stenosis  proximal LAD, occluded mid left circumflex, subtotal occlusion OM1, and patent stent mid RCA and underwent DES placement.  Interval history- reports bilateral lower extremity swelling R>L since 10 days. He is unable to wear his shoes due to swelling. He is currently on plavix after his MI. Off brilinta due to SOB. He has chronic back pain well controlled with fentanyl patch and prn oxycodone  ECOG PS- 1 Pain scale- 8 leg pain Opioid associated constipation- no  Review of systems- Review of Systems  Constitutional: Positive for malaise/fatigue. Negative for chills, fever and weight loss.  HENT: Negative for congestion, ear discharge and nosebleeds.   Eyes: Negative for blurred vision.  Respiratory: Negative for cough, hemoptysis, sputum production, shortness of breath and wheezing.   Cardiovascular: Positive for leg swelling. Negative for chest pain, palpitations, orthopnea and claudication.  Gastrointestinal: Negative for abdominal pain, blood in stool, constipation, diarrhea, heartburn, melena, nausea and vomiting.  Genitourinary: Negative for dysuria, flank pain, frequency, hematuria and urgency.  Musculoskeletal: Positive for back pain. Negative for joint pain and myalgias.  Skin: Negative for rash.  Neurological: Negative for dizziness, tingling, focal weakness, seizures, weakness and headaches.  Endo/Heme/Allergies: Does not bruise/bleed easily.  Psychiatric/Behavioral: Negative for depression and suicidal ideas. The patient does not have insomnia.       No Known Allergies   Past Medical History:  Diagnosis Date  . Arthritis   . CAD (coronary artery disease)   . Hypertension   . Prostate cancer ([place])    metastatic   . RAD (reactive airway disease)      Past Surgical History:  Procedure Laterality Date  . CORONARY ANGIOPLASTY WITH STENT PLACEMENT    . CORONARY STENT INTERVENTION N/A 02/07/2018  Procedure: CORONARY STENT INTERVENTION;  Surgeon: Isaias Cowman, MD;   Location: [place] [facility];  Service: Cardiovascular;  Laterality: N/A;  . IR GENERIC HISTORICAL  10/17/2016   IR NEPHROSTOMY PLACEMENT RIGHT 10/17/2016 [facility]  . IR GENERIC HISTORICAL  10/17/2016   IR NEPHROSTOMY PLACEMENT LEFT 10/17/2016 [name], MD [facility]  . IR GENERIC HISTORICAL  12/23/2016   IR NEPHROSTOMY EXCHANGE LEFT 12/23/2016 [facility]  . IR GENERIC HISTORICAL  12/23/2016   IR NEPHROSTOMY EXCHANGE RIGHT 12/23/2016 [facility]  . IR NEPHROSTOMY EXCHANGE LEFT  02/11/2017  . IR NEPHROSTOMY EXCHANGE RIGHT  02/11/2017  . KNEE SURGERY Left 2006   Laparascopy done on left knee, scar tissue taken out  . LEFT HEART CATH AND CORONARY ANGIOGRAPHY N/A 02/07/2018   Procedure: LEFT HEART CATH AND CORONARY ANGIOGRAPHY;  Surgeon: Isaias Cowman, MD;  Location: [place] [facility];  Service: Cardiovascular;  Laterality: N/A;  . nephrostomy tubes Bilateral     Social History   Socioeconomic History  . Marital status: Married    Spouse name: Not on file  . Number of children: Not on file  . Years of education: Not on file  . Highest education level: Not on file  Occupational History  . Not on file  Social Needs  . Financial resource strain: Not on file  . Food insecurity:    Worry: Not on file    Inability: Not on file  . Transportation needs:    Medical: Not on file    Non-medical: Not on file  Tobacco Use  . Smoking status: Former Smoker    Packs/day: 1.00    Years: 69.50    Pack years: 69.50    Last attempt to quit: 11/04/2014    Years since quitting: 3.4  . Smokeless tobacco: Never Used  Substance and Sexual Activity  . Alcohol use: No  . Drug use: No  . Sexual activity: Yes  Lifestyle  . Physical activity:    Days per week: Not on file    Minutes per session: Not on file  . Stress: Not on file  Relationships  . Social connections:    Talks on phone: Not on file    Gets together: Not on file    Attends religious service: Not on  file    Active member of club or organization: Not on file    Attends meetings of clubs or organizations: Not on file    Relationship status: Not on file  . Intimate partner violence:    Fear of current or ex partner: Not on file    Emotionally abused: Not on file    Physically abused: Not on file    Forced sexual activity: Not on file  Other Topics Concern  . Not on file  Social History Narrative  . Not on file    Family History  Problem Relation Age of Onset  . Hypertension Other   . Lung cancer Brother      Current Outpatient Medications:  .  alendronate (FOSAMAX) 70 MG tablet, Take 1 tablet (70 mg total) by mouth once a week. Take with a full glass of water on an empty stomach., Disp: 4 tablet, Rfl: 6 .  aspirin EC 81 MG tablet, Take 81 mg by mouth daily. , Disp: , Rfl:  .  atorvastatin (LIPITOR) 40 MG tablet, Take 1 tablet (40 mg total) by mouth daily at 6 PM., Disp: 30 tablet, Rfl: 0 .  budesonide-formoterol (SYMBICORT) 160-4.5 MCG/ACT inhaler, Inhale  2 puffs into the lungs 2 (two) times daily., Disp: 1 Inhaler, Rfl: 0 .  clopidogrel (PLAVIX) 75 MG tablet, Take 75 mg by mouth daily., Disp: , Rfl: 11 .  gabapentin (NEURONTIN) 100 MG capsule, Take 1 capsule (100 mg total) by mouth at bedtime., Disp: 30 capsule, Rfl: 0 .  metoprolol (LOPRESSOR) 50 MG tablet, Take 50 mg by mouth 2 (two) times daily. , Disp: , Rfl:  .  pantoprazole (PROTONIX) 40 MG tablet, Take 1 tablet (40 mg total) by mouth 2 (two) times daily., Disp: 60 tablet, Rfl: 0 .  sennosides-docusate sodium (SENOKOT-S) 8.6-50 MG tablet, Take 2 tablets by mouth 2 (two) times daily. , Disp: , Rfl:  .  tamsulosin (FLOMAX) 0.4 MG CAPS capsule, Take 1 capsule (0.4 mg total) by mouth daily., Disp: 30 capsule, Rfl: 11 .  ZYTIGA 250 MG tablet, TAKE 4 TABLETS (1,000 MG TOTAL) BY MOUTH DAILY. TAKE ON AN EMPTY STOMACH 1 HOUR BEFORE OR 2 HOURS AFTER A MEAL, Disp: 120 tablet, Rfl: 2 .  fentaNYL (DURAGESIC - DOSED MCG/HR) 12 MCG/HR,  Place 1 patch (12.5 mcg total) onto the skin every 3 (three) days., Disp: 10 patch, Rfl: 0 .  ipratropium-albuterol (DUONEB) 0.5-2.5 (3) MG/3ML SOLN, Take 3 mLs by nebulization every 6 (six) hours as needed (sob, wheezing). (Patient not taking: Reported on 04/13/2018), Disp: 360 mL, Rfl: 0 .  nitroGLYCERIN (NITROSTAT) 0.4 MG SL tablet, Place 0.4 mg under the tongue every 5 (five) minutes x 3 doses as needed for chest pain., Disp: , Rfl:  .  ondansetron (ZOFRAN) 4 MG tablet, Take 1 tablet (4 mg total) by mouth every 8 (eight) hours as needed for nausea or vomiting., Disp: 20 tablet, Rfl: 0 .  predniSONE (DELTASONE) 5 MG tablet, Take 1 tablet (5 mg total) by mouth 2 (two) times daily with a meal. (Patient not taking: Reported on 04/13/2018), Disp: 60 tablet, Rfl: 5 .  sucralfate (CARAFATE) 1 GM/10ML suspension, Take 10 mLs (1 g total) by mouth 4 (four) times daily -  with meals and at bedtime. (Patient not taking: Reported on 04/13/2018), Disp: 420 mL, Rfl: 0 .  ticagrelor (BRILINTA) 90 MG TABS tablet, Take 1 tablet (90 mg total) by mouth 2 (two) times daily. (Patient not taking: Reported on 04/13/2018), Disp: 60 tablet, Rfl: 0  Physical exam:  Vitals:   04/13/18 1010  BP: 111/71  Pulse: 71  Resp: 18  Temp: 98.4 F (36.9 C)  TempSrc: Tympanic  Weight: 190 lb 12.8 oz (86.5 kg)  Height: 5\' 8"  (1.727 m)   Physical Exam  Constitutional: He is oriented to person, place, and time. He appears well-developed and well-nourished.  HENT:  Head: Normocephalic and atraumatic.  Eyes: Pupils are equal, round, and reactive to light. EOM are normal.  Neck: Normal range of motion.  Cardiovascular: Normal rate, regular rhythm and normal heart sounds.  Pulmonary/Chest: Effort normal and breath sounds normal.  Abdominal: Soft. Bowel sounds are normal.  Musculoskeletal: He exhibits edema (b/l LE edema R>L. RLE edema +2).  Neurological: He is alert and oriented to person, place, and time.  Skin: Skin is warm and  dry.     CMP Latest Ref Rng & Units 04/13/2018  Glucose 65 - 99 mg/dL 101(H)  BUN 6 - 20 mg/dL 27(H)  Creatinine 0.61 - 1.24 mg/dL 1.37(H)  Sodium 135 - 145 mmol/L 138  Potassium 3.5 - 5.1 mmol/L 3.7  Chloride 101 - 111 mmol/L 99(L)  CO2 22 - 32 mmol/L 28  Calcium 8.9 - 10.3 mg/dL 9.0  Total Protein 6.5 - 8.1 g/dL 6.7  Total Bilirubin 0.3 - 1.2 mg/dL 0.5  Alkaline Phos 38 - 126 U/L 80  AST 15 - 41 U/L 17  ALT 17 - 63 U/L 16(L)   CBC Latest Ref Rng & Units 04/13/2018  WBC 3.8 - 10.6 K/uL 8.7  Hemoglobin 13.0 - 18.0 g/dL 10.8(L)  Hematocrit 40.0 - 52.0 % 32.6(L)  Platelets 150 - 440 K/uL 349    No images are attached to the encounter.  Nm Bone Scan Whole Body  Result Date: 04/03/2018 CLINICAL DATA:  Prostate cancer metastatic to bone, low back pain for 1-2 years EXAM: NUCLEAR MEDICINE WHOLE BODY BONE SCAN TECHNIQUE: Whole body anterior and posterior images were obtained approximately 3 hours after intravenous injection of radiopharmaceutical. RADIOPHARMACEUTICALS:  23.64 mCi Technetium-84m MDP IV COMPARISON:  10/21/2017 Radiographic correlation: CT chest 02/05/2018, CT abdomen and pelvis 01/09/2018 FINDINGS: Multiple abnormal foci of increased osseous tracer accumulation consistent with metastases. These include BILATERAL ribs, sternum, thoracolumbar spine, pelvis, BILATERAL proximal femora, and RIGHT scapula. When compared to the previous exam, no definite new sites of abnormal osseous tracer accumulation are seen. Increased tracer localization at the wrists, sternoclavicular joints, RIGHT knee, and RIGHT foot, typically degenerative. Expected urinary tract and soft tissue distribution of tracer. IMPRESSION: Multiple sites of abnormal osseous tracer accumulation consistent with osseous metastases, with overall appearance stable since 10/21/2017. Electronically Signed   By: Lavonia Dana M.D.   On: 04/03/2018 15:48   US Venous Img Lower Bilateral  Result Date: 04/13/2018 CLINICAL DATA:   Bilateral lower extremity swelling, pain EXAM: BILATERAL LOWER EXTREMITY VENOUS DOPPLER ULTRASOUND TECHNIQUE: Gray-scale sonography with graded compression, as well as color Doppler and duplex ultrasound were performed to evaluate the lower extremity deep venous systems from the level of the common femoral vein and including the common femoral, femoral, profunda femoral, popliteal and calf veins including the posterior tibial, peroneal and gastrocnemius veins when visible. The superficial great saphenous vein was also interrogated. Spectral Doppler was utilized to evaluate flow at rest and with distal augmentation maneuvers in the common femoral, femoral and popliteal veins. COMPARISON:  None. FINDINGS: RIGHT LOWER EXTREMITY Common Femoral Vein: No evidence of thrombus. Normal compressibility, respiratory phasicity and response to augmentation. Saphenofemoral Junction: No evidence of thrombus. Normal compressibility and flow on color Doppler imaging. Profunda Femoral Vein: No evidence of thrombus. Normal compressibility and flow on color Doppler imaging. Femoral Vein: No evidence of thrombus. Normal compressibility, respiratory phasicity and response to augmentation. Popliteal Vein: No evidence of thrombus. Normal compressibility, respiratory phasicity and response to augmentation. Calf Veins: No evidence of thrombus. Normal compressibility and flow on color Doppler imaging. Superficial Great Saphenous Vein: No evidence of thrombus. Normal compressibility. Venous Reflux:  None. Other Findings:  None. LEFT LOWER EXTREMITY Common Femoral Vein: No evidence of thrombus. Normal compressibility, respiratory phasicity and response to augmentation. Saphenofemoral Junction: No evidence of thrombus. Normal compressibility and flow on color Doppler imaging. Profunda Femoral Vein: No evidence of thrombus. Normal compressibility and flow on color Doppler imaging. Femoral Vein: No evidence of thrombus. Normal compressibility,  respiratory phasicity and response to augmentation. Popliteal Vein: No evidence of thrombus. Normal compressibility, respiratory phasicity and response to augmentation. Calf Veins: No evidence of thrombus. Normal compressibility and flow on color Doppler imaging. Superficial Great Saphenous Vein: No evidence of thrombus. Normal compressibility. Venous Reflux:  None. Other Findings:  None. IMPRESSION: No evidence of deep venous thrombosis. Electronically Signed   By:  M.  Shick M.D.   On: 04/13/2018 14:38     Assessment and plan- Patient is a 83 y.o. male with castrate sensitive prostate cancer and bone mets currently on zytiga here for routine f/u:  1. Castrate sensitive prostate cancer- responding well to zytiga so far. PSA is at 0.02. He gets lupron through urology  2. B/l LE swelling R>L: We obtained b/l LE doppler today which did nto show any evidence of DVt. I have spoken to his cardiologist DR. Fath who has agreed to evaluate him soon and adjust his medications. zytiga can sometimes cause leg swelling but he has been on it over a year so far and has not had any major side effects  I will see him back in 1 month with cbc/ cmp     Visit Diagnosis 1. Prostate cancer metastatic to bone (HCC)   2. Leg swelling   3. High risk medication use      Dr. Randa Evens, MD, MPH [facility] at [facility] [phone] 04/14/2018 2:19 PM

## 2018-04-17 MED FILL — predniSONE 5 MG TABS: 5 | 30 days supply | Qty: 60 | Fill #5

## 2018-04-17 MED FILL — ZYTIGA 250 MG TABLET: 250 | 30 days supply | Qty: 120 | Fill #2

## 2018-04-18 ENCOUNTER — Telehealth: Payer: Self-pay | Admitting: *Deleted

## 2018-04-18 ENCOUNTER — Other Ambulatory Visit: Payer: Self-pay | Admitting: *Deleted

## 2018-04-18 DIAGNOSIS — M7989 Other specified soft tissue disorders: Secondary | ICD-10-CM

## 2018-04-18 DIAGNOSIS — C7951 Secondary malignant neoplasm of bone: Principal | ICD-10-CM

## 2018-04-18 DIAGNOSIS — C61 Malignant neoplasm of prostate: Secondary | ICD-10-CM

## 2018-04-18 MED ORDER — FUROSEMIDE 20 MG PO TABS: 20.0000 mg | ORAL_TABLET | Freq: Every day | ORAL | 0 refills | Status: DC

## 2018-04-18 NOTE — Telephone Encounter (Signed)
Called office dr Ubaldo Glassing that pt came over today and he still has significant fluid in his right leg and it hurts and he wants fluid pill. I told [name] and she felt it looked alittle better but still swollen and told him to take 7 days of lasix and take it daily and I sent it in. He knows to use it and then get lab only 6/25 8:30 and he will be there. I also called fath office and left message about above and they took message and sent to his nurse.

## 2018-04-19 ENCOUNTER — Other Ambulatory Visit: Payer: Self-pay | Admitting: *Deleted

## 2018-04-25 ENCOUNTER — Inpatient Hospital Stay: Payer: Medicare Other

## 2018-04-25 DIAGNOSIS — C7951 Secondary malignant neoplasm of bone: Secondary | ICD-10-CM

## 2018-04-25 DIAGNOSIS — C61 Malignant neoplasm of prostate: Secondary | ICD-10-CM

## 2018-04-25 DIAGNOSIS — E876 Hypokalemia: Secondary | ICD-10-CM

## 2018-04-25 DIAGNOSIS — M7989 Other specified soft tissue disorders: Secondary | ICD-10-CM

## 2018-04-25 LAB — BASIC METABOLIC PANEL
Anion gap: 15 (ref 5–15)
BUN: 40 mg/dL — ABNORMAL HIGH (ref 8–23)
CO2: 34 mmol/L — ABNORMAL HIGH (ref 22–32)
Calcium: 9.1 mg/dL (ref 8.9–10.3)
Chloride: 89 mmol/L — ABNORMAL LOW (ref 98–111)
Creatinine, Ser: 1.88 mg/dL — ABNORMAL HIGH (ref 0.61–1.24)
GFR calc Af Amer: 37 mL/min — ABNORMAL LOW (ref >60)
GFR, EST NON AFRICAN AMERICAN: 32 mL/min — AB (ref >60)
Glucose, Bld: 163 mg/dL — ABNORMAL HIGH (ref 70–99)
POTASSIUM: 2.7 mmol/L — AB (ref 3.5–5.1)
SODIUM: 138 mmol/L (ref 135–145)

## 2018-04-25 MED ORDER — SODIUM CHLORIDE 0.9 % IV SOLN
Freq: Once | INTRAVENOUS | Status: AC
  Administered 2018-04-25: 11:00:00 via INTRAVENOUS
  Filled 2018-04-25: qty 500

## 2018-04-26 ENCOUNTER — Telehealth: Payer: Self-pay | Admitting: *Deleted

## 2018-04-26 NOTE — Telephone Encounter (Signed)
Called md office and talked about pt not feeling any better from fluid in his legs and having pain in legs from swelling. Dr. Ubaldo Glassing had give him a pill to help and we added lasix once  Day for 7 days.  We checked his potassium and it was low and he got potassium iv and we will repeat the levels Thursday. Dr. [name] would like to see it there is any medication management to help him. They will give message to fath's nurse and see what can be done.

## 2018-04-27 ENCOUNTER — Other Ambulatory Visit: Payer: Self-pay | Admitting: *Deleted

## 2018-04-27 ENCOUNTER — Telehealth: Payer: Self-pay | Admitting: *Deleted

## 2018-04-27 DIAGNOSIS — E876 Hypokalemia: Secondary | ICD-10-CM

## 2018-04-27 NOTE — Telephone Encounter (Signed)
Called pt yest and left message to have him come in and get potassium check and poss. Potassium . I did not hear back so I am calling today and the wife answered the phone and said that he feels better today. He got out and walked around a little bit. His leg is still swollen. I told her that I called cardiologist office and Ubaldo Glassing was out of town until today. They called back later yest. And told me he was out of town. I will try calling fath office again.  They are willing to come to cancer center for met c and poss. Potassium- arrive tom 10:15 and get potassium if needed. Wife agreeable  To the above

## 2018-04-28 ENCOUNTER — Inpatient Hospital Stay: Payer: Medicare Other

## 2018-04-28 ENCOUNTER — Other Ambulatory Visit: Payer: Self-pay | Admitting: Oncology

## 2018-04-28 VITALS — BP 109/73 | HR 75 | Temp 99.7°F | Resp 18

## 2018-04-28 DIAGNOSIS — C61 Malignant neoplasm of prostate: Secondary | ICD-10-CM | POA: Diagnosis not present

## 2018-04-28 DIAGNOSIS — E876 Hypokalemia: Secondary | ICD-10-CM

## 2018-04-28 LAB — BASIC METABOLIC PANEL
ANION GAP: 13 (ref 5–15)
BUN: 29 mg/dL — ABNORMAL HIGH (ref 8–23)
CO2: 31 mmol/L (ref 22–32)
Calcium: 8.6 mg/dL — ABNORMAL LOW (ref 8.9–10.3)
Chloride: 93 mmol/L — ABNORMAL LOW (ref 98–111)
Creatinine, Ser: 1.79 mg/dL — ABNORMAL HIGH (ref 0.61–1.24)
GFR, EST AFRICAN AMERICAN: 39 mL/min — AB (ref >60)
GFR, EST NON AFRICAN AMERICAN: 34 mL/min — AB (ref >60)
Glucose, Bld: 186 mg/dL — ABNORMAL HIGH (ref 70–99)
POTASSIUM: 2.9 mmol/L — AB (ref 3.5–5.1)
SODIUM: 137 mmol/L (ref 135–145)

## 2018-04-28 MED ORDER — POTASSIUM CHLORIDE 10 MEQ/100ML IV SOLN: 10.0000 meq | INTRAVENOUS | Status: DC

## 2018-04-28 MED ORDER — SODIUM CHLORIDE 0.9 % IV SOLN
INTRAVENOUS | Status: AC
  Administered 2018-04-28: 11:00:00 via INTRAVENOUS
  Filled 2018-04-28: qty 1000

## 2018-04-28 MED ORDER — SODIUM CHLORIDE 0.9 % IV SOLN
Freq: Once | INTRAVENOUS | Status: DC
  Filled 2018-04-28: qty 500

## 2018-04-28 MED ORDER — SODIUM CHLORIDE 0.9 % IV SOLN
Freq: Once | INTRAVENOUS | Status: AC
  Administered 2018-04-28: 11:00:00 via INTRAVENOUS
  Filled 2018-04-28: qty 500

## 2018-04-28 NOTE — Addendum Note (Signed)
Addended by: Dicie Beam D on: 04/28/2018 10:32 AM   Modules accepted: Orders

## 2018-05-02 ENCOUNTER — Inpatient Hospital Stay: Payer: Medicare Other | Attending: Oncology

## 2018-05-02 ENCOUNTER — Inpatient Hospital Stay: Payer: Medicare Other

## 2018-05-02 DIAGNOSIS — Z5111 Encounter for antineoplastic chemotherapy: Secondary | ICD-10-CM | POA: Insufficient documentation

## 2018-05-02 DIAGNOSIS — C7951 Secondary malignant neoplasm of bone: Secondary | ICD-10-CM | POA: Insufficient documentation

## 2018-05-02 DIAGNOSIS — C61 Malignant neoplasm of prostate: Secondary | ICD-10-CM | POA: Insufficient documentation

## 2018-05-09 ENCOUNTER — Other Ambulatory Visit: Payer: Self-pay | Admitting: Oncology

## 2018-05-09 DIAGNOSIS — C7951 Secondary malignant neoplasm of bone: Principal | ICD-10-CM

## 2018-05-09 DIAGNOSIS — C61 Malignant neoplasm of prostate: Secondary | ICD-10-CM

## 2018-05-12 ENCOUNTER — Inpatient Hospital Stay: Payer: Medicare Other

## 2018-05-12 ENCOUNTER — Inpatient Hospital Stay: Payer: Medicare Other | Attending: Oncology | Admitting: Oncology

## 2018-05-12 ENCOUNTER — Encounter: Payer: Self-pay | Admitting: Oncology

## 2018-05-12 ENCOUNTER — Other Ambulatory Visit: Payer: Self-pay

## 2018-05-12 ENCOUNTER — Inpatient Hospital Stay: Payer: Medicare Other | Attending: Oncology

## 2018-05-12 VITALS — BP 123/78 | HR 65 | Temp 98.5°F | Resp 18 | Ht 68.0 in | Wt 189.8 lb

## 2018-05-12 DIAGNOSIS — C61 Malignant neoplasm of prostate: Secondary | ICD-10-CM | POA: Diagnosis present

## 2018-05-12 DIAGNOSIS — G893 Neoplasm related pain (acute) (chronic): Secondary | ICD-10-CM | POA: Diagnosis not present

## 2018-05-12 DIAGNOSIS — D631 Anemia in chronic kidney disease: Secondary | ICD-10-CM

## 2018-05-12 DIAGNOSIS — C7951 Secondary malignant neoplasm of bone: Principal | ICD-10-CM

## 2018-05-12 DIAGNOSIS — Z87891 Personal history of nicotine dependence: Secondary | ICD-10-CM

## 2018-05-12 DIAGNOSIS — M7989 Other specified soft tissue disorders: Secondary | ICD-10-CM | POA: Diagnosis not present

## 2018-05-12 DIAGNOSIS — N189 Chronic kidney disease, unspecified: Principal | ICD-10-CM

## 2018-05-12 DIAGNOSIS — Z79899 Other long term (current) drug therapy: Secondary | ICD-10-CM

## 2018-05-12 DIAGNOSIS — Z5111 Encounter for antineoplastic chemotherapy: Secondary | ICD-10-CM | POA: Diagnosis present

## 2018-05-12 LAB — CBC WITH DIFFERENTIAL/PLATELET
Basophils Absolute: 0.1 10*3/uL (ref 0–0.1)
Basophils Relative: 1 %
Eosinophils Absolute: 0.1 10*3/uL (ref 0–0.7)
Eosinophils Relative: 1 %
HCT: 32.8 % — ABNORMAL LOW (ref 40.0–52.0)
HEMOGLOBIN: 10.8 g/dL — AB (ref 13.0–18.0)
LYMPHS ABS: 0.5 10*3/uL — AB (ref 1.0–3.6)
LYMPHS PCT: 7 %
MCH: 29.2 pg (ref 26.0–34.0)
MCHC: 33 g/dL (ref 32.0–36.0)
MCV: 88.4 fL (ref 80.0–100.0)
MONOS PCT: 7 %
Monocytes Absolute: 0.6 10*3/uL (ref 0.2–1.0)
NEUTROS PCT: 84 %
Neutro Abs: 7 10*3/uL — ABNORMAL HIGH (ref 1.4–6.5)
Platelets: 382 10*3/uL (ref 150–440)
RBC: 3.71 MIL/uL — AB (ref 4.40–5.90)
RDW: 16.3 % — ABNORMAL HIGH (ref 11.5–14.5)
WBC: 8.3 10*3/uL (ref 3.8–10.6)

## 2018-05-12 LAB — COMPREHENSIVE METABOLIC PANEL
ALBUMIN: 3.3 g/dL — AB (ref 3.5–5.0)
ALK PHOS: 79 U/L (ref 38–126)
ALT: 13 U/L (ref 0–44)
AST: 16 U/L (ref 15–41)
Anion gap: 10 (ref 5–15)
BUN: 27 mg/dL — AB (ref 8–23)
CALCIUM: 9.3 mg/dL (ref 8.9–10.3)
CO2: 29 mmol/L (ref 22–32)
CREATININE: 1.64 mg/dL — AB (ref 0.61–1.24)
Chloride: 101 mmol/L (ref 98–111)
GFR calc non Af Amer: 38 mL/min — ABNORMAL LOW (ref >60)
GFR, EST AFRICAN AMERICAN: 44 mL/min — AB (ref >60)
GLUCOSE: 155 mg/dL — AB (ref 70–99)
Potassium: 4.2 mmol/L (ref 3.5–5.1)
SODIUM: 140 mmol/L (ref 135–145)
Total Bilirubin: 0.4 mg/dL (ref 0.3–1.2)
Total Protein: 6.9 g/dL (ref 6.5–8.1)

## 2018-05-12 MED ORDER — ONDANSETRON HCL 4 MG PO TABS: 4.0000 mg | ORAL_TABLET | Freq: Three times a day (TID) | ORAL | 0 refills | Status: DC | PRN

## 2018-05-12 MED ORDER — LEUPROLIDE ACETATE (3 MONTH) 22.5 MG IM KIT
22.5000 mg | PACK | Freq: Once | INTRAMUSCULAR | Status: AC
  Administered 2018-05-12: 22.5 mg via INTRAMUSCULAR
  Filled 2018-05-12: qty 22.5

## 2018-05-12 MED ORDER — OXYCODONE HCL 5 MG PO TABS: 15.0000 mg | ORAL_TABLET | Freq: Four times a day (QID) | ORAL | 0 refills | Status: DC | PRN

## 2018-05-12 MED ORDER — LEUPROLIDE ACETATE (6 MONTH) 45 MG IM KIT
45.0000 mg | PACK | INTRAMUSCULAR | Status: AC
  Filled 2018-05-12: qty 45

## 2018-05-12 NOTE — Progress Notes (Signed)
Hematology/Oncology Consult note [facility]  Telephone:([phone] [phone]  Patient Care Team: [name], MD as PCP - General (Internal Medicine)   Name of the patient: Johnathan Gould  [id number]  October 20, 1935   Date of visit: 05/12/18  Diagnosis- 1. 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 Anemia of chronic kidney disease  Chief complaint/ Reason for visit- routine f/u of prostate cancer on zytiga and f/u of leg edema  Heme/Onc history: patient is a 83 year old male was recently admitted to the hospital on 10/16/2016 with symptoms of left hip pain and renal failure. He was found to have bilateral hydronephrosis and is status post bilateral nephrostomy tube placement. CT abdomen showed an irregular polypoid mass at the posterior aspect of the bladder highly concerning for bladder carcinoma. Extensive bulky adenopathy throughout the abdomen and pelvis consistent with nodal metastases. Widespread osseous metastatic disease. Bone scan showed multifocal areas of increased activity throughout the pelvis, bilateral femurs, rib cage, right scapula, thoracic cervical and lumbar spine consistent with metastatic disease. Patient was found to have enlarged abnormal prostate as well as an elevated PSA of 469 and was presumptively diagnosed with metastatic prostate cancer. He was started on Firmagon by urology. He was also seen by radiation oncology and started palliative radiation to his left hip on 11/04/2016 and finished 10 doses of palliative Rt.  He is currently on lupron through usevery 3 months  4 core biopsy obtained for tissue diagnosis which showed: prostate adenocarcinoma gleasons 9 and 10 in all 4 cores  zytiga started in March 2018. He is on fosamax for his osteoporosis which was stopped due to heartburn  Patient was admitted to the hospital on 02/05/18 with worsening epigastric pain. He was subsequently found to have acute  MITwo-vessel coronary artery disease, with 80% stenosis proximal LAD, occluded mid left circumflex, subtotal occlusion OM1, and patent stent mid RCAand underwent DES placement.    Interval history- leg swelling is a little better on lasix. He still has low back pain radiating to his legs. Denies other complaints  ECOG PS- 1 Pain scale- 4 Opioid associated constipation- no  Review of systems- Review of Systems  Constitutional: Positive for malaise/fatigue. Negative for chills, fever and weight loss.  HENT: Negative for congestion, ear discharge and nosebleeds.   Eyes: Negative for blurred vision.  Respiratory: Negative for cough, hemoptysis, sputum production, shortness of breath and wheezing.   Cardiovascular: Positive for leg swelling. Negative for chest pain, palpitations, orthopnea and claudication.  Gastrointestinal: Negative for abdominal pain, blood in stool, constipation, diarrhea, heartburn, melena, nausea and vomiting.  Genitourinary: Negative for dysuria, flank pain, frequency, hematuria and urgency.  Musculoskeletal: Positive for back pain. Negative for joint pain and myalgias.  Skin: Negative for rash.  Neurological: Negative for dizziness, tingling, focal weakness, seizures, weakness and headaches.  Endo/Heme/Allergies: Does not bruise/bleed easily.  Psychiatric/Behavioral: Negative for depression and suicidal ideas. The patient does not have insomnia.      No Known Allergies   Past Medical History:  Diagnosis Date  . Arthritis   . CAD (coronary artery disease)   . Hypertension   . Prostate cancer ([place])    metastatic   . RAD (reactive airway disease)      Past Surgical History:  Procedure Laterality Date  . CORONARY ANGIOPLASTY WITH STENT PLACEMENT    . CORONARY STENT INTERVENTION N/A 02/07/2018   Procedure: CORONARY STENT INTERVENTION;  Surgeon: Isaias Cowman, MD;  Location: [place] [facility];  Service:  Cardiovascular;  Laterality: N/A;  . IR  GENERIC HISTORICAL  10/17/2016   IR NEPHROSTOMY PLACEMENT RIGHT 10/17/2016 [facility]  . IR GENERIC HISTORICAL  10/17/2016   IR NEPHROSTOMY PLACEMENT LEFT 10/17/2016 [name], MD [facility]  . IR GENERIC HISTORICAL  12/23/2016   IR NEPHROSTOMY EXCHANGE LEFT 12/23/2016 [facility]  . IR GENERIC HISTORICAL  12/23/2016   IR NEPHROSTOMY EXCHANGE RIGHT 12/23/2016 [facility]  . IR NEPHROSTOMY EXCHANGE LEFT  02/11/2017  . IR NEPHROSTOMY EXCHANGE RIGHT  02/11/2017  . KNEE SURGERY Left 2006   Laparascopy done on left knee, scar tissue taken out  . LEFT HEART CATH AND CORONARY ANGIOGRAPHY N/A 02/07/2018   Procedure: LEFT HEART CATH AND CORONARY ANGIOGRAPHY;  Surgeon: Isaias Cowman, MD;  Location: [place] [facility];  Service: Cardiovascular;  Laterality: N/A;  . nephrostomy tubes Bilateral     Social History   Socioeconomic History  . Marital status: Married    Spouse name: Not on file  . Number of children: Not on file  . Years of education: Not on file  . Highest education level: Not on file  Occupational History  . Not on file  Social Needs  . Financial resource strain: Not on file  . Food insecurity:    Worry: Not on file    Inability: Not on file  . Transportation needs:    Medical: Not on file    Non-medical: Not on file  Tobacco Use  . Smoking status: Former Smoker    Packs/day: 1.00    Years: 69.50    Pack years: 69.50    Last attempt to quit: 11/04/2014    Years since quitting: 3.5  . Smokeless tobacco: Never Used  Substance and Sexual Activity  . Alcohol use: No  . Drug use: No  . Sexual activity: Yes  Lifestyle  . Physical activity:    Days per week: Not on file    Minutes per session: Not on file  . Stress: Not on file  Relationships  . Social connections:    Talks on phone: Not on file    Gets together: Not on file    Attends religious service: Not on file    Active member of club or organization: Not on file    Attends meetings of  clubs or organizations: Not on file    Relationship status: Not on file  . Intimate partner violence:    Fear of current or ex partner: Not on file    Emotionally abused: Not on file    Physically abused: Not on file    Forced sexual activity: Not on file  Other Topics Concern  . Not on file  Social History Narrative  . Not on file    Family History  Problem Relation Age of Onset  . Hypertension Other   . Lung cancer Brother      Current Outpatient Medications:  .  alendronate (FOSAMAX) 70 MG tablet, Take 1 tablet (70 mg total) by mouth once a week. Take with a full glass of water on an empty stomach., Disp: 4 tablet, Rfl: 6 .  aspirin EC 81 MG tablet, Take 81 mg by mouth daily. , Disp: , Rfl:  .  atorvastatin (LIPITOR) 40 MG tablet, Take 1 tablet (40 mg total) by mouth daily at 6 PM., Disp: 30 tablet, Rfl: 0 .  budesonide-formoterol (SYMBICORT) 160-4.5 MCG/ACT inhaler, Inhale 2 puffs into the lungs 2 (two) times daily., Disp: 1 Inhaler, Rfl: 0 .  clopidogrel (  PLAVIX) 75 MG tablet, Take 75 mg by mouth daily., Disp: , Rfl: 11 .  fentaNYL (DURAGESIC - DOSED MCG/HR) 12 MCG/HR, Place 1 patch (12.5 mcg total) onto the skin every 3 (three) days., Disp: 10 patch, Rfl: 0 .  furosemide (LASIX) 20 MG tablet, Take 1 tablet (20 mg total) by mouth daily., Disp: 7 tablet, Rfl: 0 .  gabapentin (NEURONTIN) 100 MG capsule, Take 1 capsule (100 mg total) by mouth at bedtime., Disp: 30 capsule, Rfl: 0 .  metoprolol (LOPRESSOR) 50 MG tablet, Take 50 mg by mouth 2 (two) times daily. , Disp: , Rfl:  .  pantoprazole (PROTONIX) 40 MG tablet, Take 1 tablet (40 mg total) by mouth 2 (two) times daily., Disp: 60 tablet, Rfl: 0 .  predniSONE (DELTASONE) 5 MG tablet, TAKE 1 TABLET (5 MG TOTAL) BY MOUTH 2 TIMES DAILY WITH A MEAL., Disp: 60 tablet, Rfl: 5 .  tamsulosin (FLOMAX) 0.4 MG CAPS capsule, Take 1 capsule (0.4 mg total) by mouth daily., Disp: 30 capsule, Rfl: 11 .  ZYTIGA 250 MG tablet, TAKE 4 TABLETS (1,000  MG TOTAL) BY MOUTH DAILY. TAKE ON AN EMPTY STOMACH 1 HOUR BEFORE OR 2 HOURS AFTER A MEAL, Disp: 120 tablet, Rfl: 2 .  ipratropium-albuterol (DUONEB) 0.5-2.5 (3) MG/3ML SOLN, Take 3 mLs by nebulization every 6 (six) hours as needed (sob, wheezing). (Patient not taking: Reported on 04/13/2018), Disp: 360 mL, Rfl: 0 .  nitroGLYCERIN (NITROSTAT) 0.4 MG SL tablet, Place 0.4 mg under the tongue every 5 (five) minutes x 3 doses as needed for chest pain., Disp: , Rfl:  .  ondansetron (ZOFRAN) 4 MG tablet, Take 1 tablet (4 mg total) by mouth every 8 (eight) hours as needed for nausea or vomiting., Disp: 20 tablet, Rfl: 0 .  oxyCODONE (OXY IR/ROXICODONE) 5 MG immediate release tablet, Take 3 tablets (15 mg total) by mouth every 6 (six) hours as needed for severe pain., Disp: 540 tablet, Rfl: 0 .  predniSONE (DELTASONE) 5 MG tablet, Take 1 tablet (5 mg total) by mouth 2 (two) times daily with a meal. (Patient not taking: Reported on 04/13/2018), Disp: 60 tablet, Rfl: 5 .  sennosides-docusate sodium (SENOKOT-S) 8.6-50 MG tablet, Take 2 tablets by mouth 2 (two) times daily. , Disp: , Rfl:  .  sucralfate (CARAFATE) 1 GM/10ML suspension, Take 10 mLs (1 g total) by mouth 4 (four) times daily -  with meals and at bedtime. (Patient not taking: Reported on 04/13/2018), Disp: 420 mL, Rfl: 0 .  ticagrelor (BRILINTA) 90 MG TABS tablet, Take 1 tablet (90 mg total) by mouth 2 (two) times daily. (Patient not taking: Reported on 04/13/2018), Disp: 60 tablet, Rfl: 0 No current facility-administered medications for this visit.   Facility-Administered Medications Ordered in Other Visits:  .  0.9 %  sodium chloride infusion, , Intravenous, Continuous, Sindy Guadeloupe, MD, Stopped at 04/28/18 1452 .  [START ON 08/10/2018] Leuprolide Acetate (6 Month) (LUPRON) injection 45 mg, 45 mg, Intramuscular, Q6 months, Sindy Guadeloupe, MD  Physical exam:  Vitals:   05/12/18 0956  BP: 123/78  Pulse: 65  Resp: 18  Temp: 98.5 F (36.9 C)    TempSrc: Tympanic  SpO2: 98%  Weight: 189 lb 12.8 oz (86.1 kg)  Height: 5\' 8"  (1.727 m)   Physical Exam  Constitutional: He is oriented to person, place, and time. He appears well-developed and well-nourished.  HENT:  Head: Normocephalic and atraumatic.  Eyes: Pupils are equal, round, and reactive to light. EOM are  normal.  Neck: Normal range of motion.  Cardiovascular: Normal rate, regular rhythm and normal heart sounds.  Pulmonary/Chest: Effort normal and breath sounds normal.  Abdominal: Soft. Bowel sounds are normal.  Musculoskeletal: He exhibits edema (b/l R>L).  Neurological: He is alert and oriented to person, place, and time.  Skin: Skin is warm and dry.     CMP Latest Ref Rng & Units 05/12/2018  Glucose 70 - 99 mg/dL 155(H)  BUN 8 - 23 mg/dL 27(H)  Creatinine 0.61 - 1.24 mg/dL 1.64(H)  Sodium 135 - 145 mmol/L 140  Potassium 3.5 - 5.1 mmol/L 4.2  Chloride 98 - 111 mmol/L 101  CO2 22 - 32 mmol/L 29  Calcium 8.9 - 10.3 mg/dL 9.3  Total Protein 6.5 - 8.1 g/dL 6.9  Total Bilirubin 0.3 - 1.2 mg/dL 0.4  Alkaline Phos 38 - 126 U/L 79  AST 15 - 41 U/L 16  ALT 0 - 44 U/L 13   CBC Latest Ref Rng & Units 05/12/2018  WBC 3.8 - 10.6 K/uL 8.3  Hemoglobin 13.0 - 18.0 g/dL 10.8(L)  Hematocrit 40.0 - 52.0 % 32.8(L)  Platelets 150 - 440 K/uL 382    No images are attached to the encounter.  US Venous Img Lower Bilateral  Result Date: 04/13/2018 CLINICAL DATA:  Bilateral lower extremity swelling, pain EXAM: BILATERAL LOWER EXTREMITY VENOUS DOPPLER ULTRASOUND TECHNIQUE: Gray-scale sonography with graded compression, as well as color Doppler and duplex ultrasound were performed to evaluate the lower extremity deep venous systems from the level of the common femoral vein and including the common femoral, femoral, profunda femoral, popliteal and calf veins including the posterior tibial, peroneal and gastrocnemius veins when visible. The superficial great saphenous vein was also  interrogated. Spectral Doppler was utilized to evaluate flow at rest and with distal augmentation maneuvers in the common femoral, femoral and popliteal veins. COMPARISON:  None. FINDINGS: RIGHT LOWER EXTREMITY Common Femoral Vein: No evidence of thrombus. Normal compressibility, respiratory phasicity and response to augmentation. Saphenofemoral Junction: No evidence of thrombus. Normal compressibility and flow on color Doppler imaging. Profunda Femoral Vein: No evidence of thrombus. Normal compressibility and flow on color Doppler imaging. Femoral Vein: No evidence of thrombus. Normal compressibility, respiratory phasicity and response to augmentation. Popliteal Vein: No evidence of thrombus. Normal compressibility, respiratory phasicity and response to augmentation. Calf Veins: No evidence of thrombus. Normal compressibility and flow on color Doppler imaging. Superficial Great Saphenous Vein: No evidence of thrombus. Normal compressibility. Venous Reflux:  None. Other Findings:  None. LEFT LOWER EXTREMITY Common Femoral Vein: No evidence of thrombus. Normal compressibility, respiratory phasicity and response to augmentation. Saphenofemoral Junction: No evidence of thrombus. Normal compressibility and flow on color Doppler imaging. Profunda Femoral Vein: No evidence of thrombus. Normal compressibility and flow on color Doppler imaging. Femoral Vein: No evidence of thrombus. Normal compressibility, respiratory phasicity and response to augmentation. Popliteal Vein: No evidence of thrombus. Normal compressibility, respiratory phasicity and response to augmentation. Calf Veins: No evidence of thrombus. Normal compressibility and flow on color Doppler imaging. Superficial Great Saphenous Vein: No evidence of thrombus. Normal compressibility. Venous Reflux:  None. Other Findings:  None. IMPRESSION: No evidence of deep venous thrombosis. Electronically Signed   By: Jerilynn Mages.  Shick M.D.   On: 04/13/2018 14:38     Assessment  and plan- Patient is a 83 y.o. male with following issues:  1. Castrate sensitive prostate cancer- he gets lupron today. Continue zytiga. Repeat cbc/cmp and psa and see me in 1 months time  2. Leg swelling- being followed by cardiology. On lasix and potassium. Will see them in august  3. Neoplasm related bone pain- will renew oxycodone today  4. ckd - stable  5. Anemia of ckd- stable. No need for epo at this time   Visit Diagnosis 1. Prostate cancer metastatic to bone ([place])   2. High risk medication use   3. Anemia in chronic kidney disease, unspecified CKD stage   4. Neoplasm related pain      Dr. Randa Evens, MD, MPH [facility] [phone] 05/12/2018 1:06 PM

## 2018-05-16 MED FILL — predniSONE 5 MG TABS: 5 | 30 days supply | Qty: 60 | Fill #0

## 2018-05-16 MED FILL — ZYTIGA 250 MG TABLET: 250 | 30 days supply | Qty: 120 | Fill #0

## 2018-05-17 ENCOUNTER — Other Ambulatory Visit: Payer: Self-pay | Admitting: Oncology

## 2018-05-22 NOTE — Telephone Encounter (Signed)
I will approve this time but I really want his cardiologist to decide if he needs to continue this

## 2018-06-07 ENCOUNTER — Ambulatory Visit: Payer: Medicare Other | Admitting: Radiation Oncology

## 2018-06-09 ENCOUNTER — Inpatient Hospital Stay: Payer: Medicare Other

## 2018-06-09 ENCOUNTER — Other Ambulatory Visit: Payer: Self-pay

## 2018-06-09 ENCOUNTER — Inpatient Hospital Stay: Payer: Medicare Other | Attending: Oncology | Admitting: Oncology

## 2018-06-09 VITALS — BP 125/81 | HR 74 | Temp 98.0°F | Resp 18 | Ht 68.0 in | Wt 190.0 lb

## 2018-06-09 DIAGNOSIS — E876 Hypokalemia: Secondary | ICD-10-CM | POA: Insufficient documentation

## 2018-06-09 DIAGNOSIS — R609 Edema, unspecified: Secondary | ICD-10-CM | POA: Insufficient documentation

## 2018-06-09 DIAGNOSIS — Z79899 Other long term (current) drug therapy: Secondary | ICD-10-CM | POA: Diagnosis not present

## 2018-06-09 DIAGNOSIS — C7951 Secondary malignant neoplasm of bone: Secondary | ICD-10-CM | POA: Diagnosis not present

## 2018-06-09 DIAGNOSIS — C61 Malignant neoplasm of prostate: Secondary | ICD-10-CM

## 2018-06-09 DIAGNOSIS — R600 Localized edema: Secondary | ICD-10-CM

## 2018-06-09 DIAGNOSIS — G893 Neoplasm related pain (acute) (chronic): Secondary | ICD-10-CM | POA: Diagnosis not present

## 2018-06-09 DIAGNOSIS — Z87891 Personal history of nicotine dependence: Secondary | ICD-10-CM

## 2018-06-09 DIAGNOSIS — N189 Chronic kidney disease, unspecified: Secondary | ICD-10-CM | POA: Insufficient documentation

## 2018-06-09 LAB — CBC WITH DIFFERENTIAL/PLATELET
Basophils Absolute: 0 10*3/uL (ref 0–0.1)
Basophils Relative: 0 %
Eosinophils Absolute: 0 10*3/uL (ref 0–0.7)
Eosinophils Relative: 0 %
HEMATOCRIT: 33.7 % — AB (ref 40.0–52.0)
HEMOGLOBIN: 11.1 g/dL — AB (ref 13.0–18.0)
LYMPHS ABS: 0.4 10*3/uL — AB (ref 1.0–3.6)
Lymphocytes Relative: 5 %
MCH: 28.4 pg (ref 26.0–34.0)
MCHC: 32.9 g/dL (ref 32.0–36.0)
MCV: 86.4 fL (ref 80.0–100.0)
MONOS PCT: 6 %
Monocytes Absolute: 0.5 10*3/uL (ref 0.2–1.0)
NEUTROS ABS: 7.2 10*3/uL — AB (ref 1.4–6.5)
NEUTROS PCT: 89 %
Platelets: 379 10*3/uL (ref 150–440)
RBC: 3.9 MIL/uL — AB (ref 4.40–5.90)
RDW: 16.2 % — ABNORMAL HIGH (ref 11.5–14.5)
WBC: 8.2 10*3/uL (ref 3.8–10.6)

## 2018-06-09 LAB — COMPREHENSIVE METABOLIC PANEL
ALT: 14 U/L (ref 0–44)
ANION GAP: 12 (ref 5–15)
AST: 16 U/L (ref 15–41)
Albumin: 3.3 g/dL — ABNORMAL LOW (ref 3.5–5.0)
Alkaline Phosphatase: 86 U/L (ref 38–126)
BUN: 41 mg/dL — ABNORMAL HIGH (ref 8–23)
CHLORIDE: 97 mmol/L — AB (ref 98–111)
CO2: 30 mmol/L (ref 22–32)
Calcium: 9.1 mg/dL (ref 8.9–10.3)
Creatinine, Ser: 1.94 mg/dL — ABNORMAL HIGH (ref 0.61–1.24)
GFR calc non Af Amer: 31 mL/min — ABNORMAL LOW (ref >60)
GFR, EST AFRICAN AMERICAN: 36 mL/min — AB (ref >60)
Glucose, Bld: 158 mg/dL — ABNORMAL HIGH (ref 70–99)
POTASSIUM: 3.1 mmol/L — AB (ref 3.5–5.1)
Sodium: 139 mmol/L (ref 135–145)
Total Bilirubin: 0.2 mg/dL — ABNORMAL LOW (ref 0.3–1.2)
Total Protein: 7 g/dL (ref 6.5–8.1)

## 2018-06-09 MED ORDER — FENTANYL 12 MCG/HR TD PT72: 12.5000 ug | MEDICATED_PATCH | TRANSDERMAL | 0 refills | Status: DC

## 2018-06-09 MED ORDER — ONDANSETRON HCL 4 MG PO TABS: 4.0000 mg | ORAL_TABLET | Freq: Three times a day (TID) | ORAL | 0 refills | Status: DC | PRN

## 2018-06-09 MED ORDER — OXYCODONE HCL 5 MG PO TABS: 15.0000 mg | ORAL_TABLET | Freq: Four times a day (QID) | ORAL | 0 refills | Status: DC | PRN

## 2018-06-09 NOTE — Progress Notes (Signed)
Pain level (8) whole body pain

## 2018-06-10 LAB — PSA: Prostatic Specific Antigen: 0.01 ng/mL (ref 0.00–4.00)

## 2018-06-12 ENCOUNTER — Telehealth: Payer: Self-pay

## 2018-06-12 ENCOUNTER — Encounter: Payer: Self-pay | Admitting: Oncology

## 2018-06-12 NOTE — Progress Notes (Signed)
Hematology/Oncology Consult note [facility]  [phone] [phone]  Patient Care Team: [name], MD as PCP - General (Internal Medicine)   Name of the patient: Johnathan Gould  [id number]  January 02, 1935   Date of visit: 06/12/18  Diagnosis- 1. 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 Anemia of chronic kidney disease  Chief complaint/ Reason for visit- routine f/u of prostate cancer on zytiga  Heme/Onc history: patient is a35 year old male was recently admitted to the hospital on 10/16/2016 with symptoms of left hip pain and renal failure. He was found to have bilateral hydronephrosis and is status post bilateral nephrostomy tube placement. CT abdomen showed an irregular polypoid mass at the posterior aspect of the bladder highly concerning for bladder carcinoma. Extensive bulky adenopathy throughout the abdomen and pelvis consistent with nodal metastases. Widespread osseous metastatic disease. Bone scan showed multifocal areas of increased activity throughout the pelvis, bilateral femurs, rib cage, right scapula, thoracic cervical and lumbar spine consistent with metastatic disease. Patient was found to have enlarged abnormal prostate as well as an elevated PSA of 469 and was presumptively diagnosed with metastatic prostate cancer. He was started on Firmagon by urology. He was also seen by radiation oncology and started palliative radiation to his left hip on 11/04/2016 and finished 10 doses of palliative Rt.  He is currently on lupron through usevery 3 months  4 core biopsy obtained for tissue diagnosis which showed: prostate adenocarcinoma gleasons 9 and 10 in all 4 cores  zytiga started in March 2018. He is on fosamax for his osteoporosis which was stopped due to heartburn  Patient was admitted to the hospital on 02/05/18 with worsening epigastric pain. He was subsequently found to have acute MITwo-vessel coronary artery  disease, with 80% stenosis proximal LAD, occluded mid left circumflex, subtotal occlusion OM1, and patent stent mid RCAand underwent DES placement  Interval history- leg swelling is better today but has not resolved. He has been taking lasix daily. Reports chronic fatigue and back pain. Denies other complaints  ECOG PS- 1 Pain scale- 4 Opioid associated constipation- no  Review of systems- Review of Systems  Constitutional: Positive for malaise/fatigue. Negative for chills, fever and weight loss.  HENT: Negative for congestion, ear discharge and nosebleeds.   Eyes: Negative for blurred vision.  Respiratory: Negative for cough, hemoptysis, sputum production, shortness of breath and wheezing.   Cardiovascular: Negative for chest pain, palpitations, orthopnea and claudication.  Gastrointestinal: Negative for abdominal pain, blood in stool, constipation, diarrhea, heartburn, melena, nausea and vomiting.  Genitourinary: Negative for dysuria, flank pain, frequency, hematuria and urgency.  Musculoskeletal: Positive for back pain. Negative for joint pain and myalgias.  Skin: Negative for rash.  Neurological: Negative for dizziness, tingling, focal weakness, seizures, weakness and headaches.  Endo/Heme/Allergies: Does not bruise/bleed easily.  Psychiatric/Behavioral: Negative for depression and suicidal ideas. The patient does not have insomnia.       No Known Allergies   Past Medical History:  Diagnosis Date  . Arthritis   . CAD (coronary artery disease)   . Hypertension   . Prostate cancer ([place])    metastatic   . RAD (reactive airway disease)      Past Surgical History:  Procedure Laterality Date  . CORONARY ANGIOPLASTY WITH STENT PLACEMENT    . CORONARY STENT INTERVENTION N/A 02/07/2018   Procedure: CORONARY STENT INTERVENTION;  Surgeon: Isaias Cowman, MD;  Location: [place] [facility];  Service: Cardiovascular;  Laterality: N/A;  . IR GENERIC  HISTORICAL  10/17/2016   IR  NEPHROSTOMY PLACEMENT RIGHT 10/17/2016 [facility]  . IR GENERIC HISTORICAL  10/17/2016   IR NEPHROSTOMY PLACEMENT LEFT 10/17/2016 [name], MD [facility]  . IR GENERIC HISTORICAL  12/23/2016   IR NEPHROSTOMY EXCHANGE LEFT 12/23/2016 [facility]  . IR GENERIC HISTORICAL  12/23/2016   IR NEPHROSTOMY EXCHANGE RIGHT 12/23/2016 [facility]  . IR NEPHROSTOMY EXCHANGE LEFT  02/11/2017  . IR NEPHROSTOMY EXCHANGE RIGHT  02/11/2017  . KNEE SURGERY Left 2006   Laparascopy done on left knee, scar tissue taken out  . LEFT HEART CATH AND CORONARY ANGIOGRAPHY N/A 02/07/2018   Procedure: LEFT HEART CATH AND CORONARY ANGIOGRAPHY;  Surgeon: Isaias Cowman, MD;  Location: [place] [facility];  Service: Cardiovascular;  Laterality: N/A;  . nephrostomy tubes Bilateral     Social History   Socioeconomic History  . Marital status: Married    Spouse name: Not on file  . Number of children: Not on file  . Years of education: Not on file  . Highest education level: Not on file  Occupational History  . Not on file  Social Needs  . Financial resource strain: Not on file  . Food insecurity:    Worry: Not on file    Inability: Not on file  . Transportation needs:    Medical: Not on file    Non-medical: Not on file  Tobacco Use  . Smoking status: Former Smoker    Packs/day: 1.00    Years: 69.50    Pack years: 69.50    Last attempt to quit: 11/04/2014    Years since quitting: 3.6  . Smokeless tobacco: Never Used  Substance and Sexual Activity  . Alcohol use: No  . Drug use: No  . Sexual activity: Yes  Lifestyle  . Physical activity:    Days per week: Not on file    Minutes per session: Not on file  . Stress: Not on file  Relationships  . Social connections:    Talks on phone: Not on file    Gets together: Not on file    Attends religious service: Not on file    Active member of club or organization: Not on file    Attends meetings of clubs or organizations: Not on file      Relationship status: Not on file  . Intimate partner violence:    Fear of current or ex partner: Not on file    Emotionally abused: Not on file    Physically abused: Not on file    Forced sexual activity: Not on file  Other Topics Concern  . Not on file  Social History Narrative  . Not on file    Family History  Problem Relation Age of Onset  . Hypertension Other   . Lung cancer Brother      Current Outpatient Medications:  .  aspirin EC 81 MG tablet, Take 81 mg by mouth daily. , Disp: , Rfl:  .  clopidogrel (PLAVIX) 75 MG tablet, Take 75 mg by mouth daily., Disp: , Rfl: 11 .  furosemide (LASIX) 20 MG tablet, TAKE 1 TABLET BY MOUTH ONCE DAILY, Disp: 7 tablet, Rfl: 0 .  metoprolol (LOPRESSOR) 50 MG tablet, Take 50 mg by mouth 2 (two) times daily. , Disp: , Rfl:  .  predniSONE (DELTASONE) 5 MG tablet, Take 1 tablet (5 mg total) by mouth 2 (two) times daily with a meal., Disp: 60 tablet, Rfl: 5 .  predniSONE (DELTASONE) 5 MG  tablet, TAKE 1 TABLET (5 MG TOTAL) BY MOUTH 2 TIMES DAILY WITH A MEAL., Disp: 60 tablet, Rfl: 5 .  tamsulosin (FLOMAX) 0.4 MG CAPS capsule, Take 1 capsule (0.4 mg total) by mouth daily., Disp: 30 capsule, Rfl: 11 .  ZYTIGA 250 MG tablet, TAKE 4 TABLETS (1,000 MG TOTAL) BY MOUTH DAILY. TAKE ON AN EMPTY STOMACH 1 HOUR BEFORE OR 2 HOURS AFTER A MEAL, Disp: 120 tablet, Rfl: 2 .  alendronate (FOSAMAX) 70 MG tablet, Take 1 tablet (70 mg total) by mouth once a week. Take with a full glass of water on an empty stomach., Disp: 4 tablet, Rfl: 6 .  atorvastatin (LIPITOR) 40 MG tablet, Take 1 tablet (40 mg total) by mouth daily at 6 PM., Disp: 30 tablet, Rfl: 0 .  budesonide-formoterol (SYMBICORT) 160-4.5 MCG/ACT inhaler, Inhale 2 puffs into the lungs 2 (two) times daily., Disp: 1 Inhaler, Rfl: 0 .  fentaNYL (DURAGESIC - DOSED MCG/HR) 12 MCG/HR, Place 1 patch (12.5 mcg total) onto the skin every 3 (three) days., Disp: 10 patch, Rfl: 0 .  gabapentin (NEURONTIN) 100 MG  capsule, Take 1 capsule (100 mg total) by mouth at bedtime., Disp: 30 capsule, Rfl: 0 .  ipratropium-albuterol (DUONEB) 0.5-2.5 (3) MG/3ML SOLN, Take 3 mLs by nebulization every 6 (six) hours as needed (sob, wheezing). (Patient not taking: Reported on 04/13/2018), Disp: 360 mL, Rfl: 0 .  nitroGLYCERIN (NITROSTAT) 0.4 MG SL tablet, Place 0.4 mg under the tongue every 5 (five) minutes x 3 doses as needed for chest pain., Disp: , Rfl:  .  ondansetron (ZOFRAN) 4 MG tablet, Take 1 tablet (4 mg total) by mouth every 8 (eight) hours as needed for nausea or vomiting., Disp: 20 tablet, Rfl: 0 .  oxyCODONE (OXY IR/ROXICODONE) 5 MG immediate release tablet, Take 3 tablets (15 mg total) by mouth every 6 (six) hours as needed for severe pain., Disp: 540 tablet, Rfl: 0 .  pantoprazole (PROTONIX) 40 MG tablet, Take 1 tablet (40 mg total) by mouth 2 (two) times daily., Disp: 60 tablet, Rfl: 0 .  sennosides-docusate sodium (SENOKOT-S) 8.6-50 MG tablet, Take 2 tablets by mouth 2 (two) times daily. , Disp: , Rfl:  .  sucralfate (CARAFATE) 1 GM/10ML suspension, Take 10 mLs (1 g total) by mouth 4 (four) times daily -  with meals and at bedtime. (Patient not taking: Reported on 04/13/2018), Disp: 420 mL, Rfl: 0 .  ticagrelor (BRILINTA) 90 MG TABS tablet, Take 1 tablet (90 mg total) by mouth 2 (two) times daily. (Patient not taking: Reported on 04/13/2018), Disp: 60 tablet, Rfl: 0 No current facility-administered medications for this visit.   Facility-Administered Medications Ordered in Other Visits:  .  0.9 %  sodium chloride infusion, , Intravenous, Continuous, Sindy Guadeloupe, MD, Stopped at 04/28/18 1452 .  [START ON 08/10/2018] Leuprolide Acetate (6 Month) (LUPRON) injection 45 mg, 45 mg, Intramuscular, Q6 months, Sindy Guadeloupe, MD  Physical exam:  Vitals:   06/09/18 1149  BP: 125/81  Pulse: 74  Resp: 18  Temp: 98 F (36.7 C)  TempSrc: Tympanic  SpO2: 96%  Weight: 190 lb (86.2 kg)  Height: 5\' 8"  (1.727 m)    Physical Exam  Constitutional: He is oriented to person, place, and time. He appears well-developed and well-nourished.  HENT:  Head: Normocephalic and atraumatic.  Eyes: Pupils are equal, round, and reactive to light. EOM are normal.  Neck: Normal range of motion.  Cardiovascular: Normal rate, regular rhythm and normal heart sounds.  Pulmonary/Chest: Effort normal and breath sounds normal.  Abdominal: Soft. Bowel sounds are normal.  Musculoskeletal: He exhibits edema.  Neurological: He is alert and oriented to person, place, and time.  Skin: Skin is warm and dry.     CMP Latest Ref Rng & Units 06/09/2018  Glucose 70 - 99 mg/dL 158(H)  BUN 8 - 23 mg/dL 41(H)  Creatinine 0.61 - 1.24 mg/dL 1.94(H)  Sodium 135 - 145 mmol/L 139  Potassium 3.5 - 5.1 mmol/L 3.1(L)  Chloride 98 - 111 mmol/L 97(L)  CO2 22 - 32 mmol/L 30  Calcium 8.9 - 10.3 mg/dL 9.1  Total Protein 6.5 - 8.1 g/dL 7.0  Total Bilirubin 0.3 - 1.2 mg/dL 0.2(L)  Alkaline Phos 38 - 126 U/L 86  AST 15 - 41 U/L 16  ALT 0 - 44 U/L 14   CBC Latest Ref Rng & Units 06/09/2018  WBC 3.8 - 10.6 K/uL 8.2  Hemoglobin 13.0 - 18.0 g/dL 11.1(L)  Hematocrit 40.0 - 52.0 % 33.7(L)  Platelets 150 - 440 K/uL 379      Assessment and plan- Patient is a 83 y.o. male with following issues:  1. CSPC- continue zytiga. PSA continues to to trend down. He received lupron last month.   2. Leg swelling- he is on lasix and po potassium per cardiology. He is hypokalemic today and his creatinine has been slowly trending up. I have asked him to take lasix PO prn only if his swelling gets worse instead of standing lasix. I will refer him back to Dr. Candiss Norse from nephrology who he has seen in the past for his CKD to assist with diuretics as well.  3. Narcotics have been renewed today.  2 weeks- bmp to repeat potassium levels 1 month- cbc cmp and psa.  I will see him back in 2 months with same labs    Visit Diagnosis 1. Prostate cancer metastatic to  bone ([place])   2. High risk medication use   3. Leg edema   4. Neoplasm related pain      Dr. Randa Evens, MD, MPH [facility] at [facility] [phone] 06/12/2018 11:45 AM

## 2018-06-12 NOTE — Telephone Encounter (Signed)
Spoke with Dr Candiss Norse office to confrim an appointment for the patient. Appointment was schedule for 07/13/18 @ 2:00 pm. The patient wife has been called and confirm the appointment time and date.

## 2018-06-13 MED FILL — predniSONE 5 MG TABS: 5 | 30 days supply | Qty: 60 | Fill #1

## 2018-06-13 MED FILL — ZYTIGA 250 MG TABLET: 250 | 30 days supply | Qty: 120 | Fill #1

## 2018-06-19 ENCOUNTER — Ambulatory Visit: Payer: Medicare Other | Admitting: Radiation Oncology

## 2018-06-23 ENCOUNTER — Inpatient Hospital Stay: Payer: Medicare Other

## 2018-06-23 DIAGNOSIS — C61 Malignant neoplasm of prostate: Secondary | ICD-10-CM

## 2018-06-23 DIAGNOSIS — C7951 Secondary malignant neoplasm of bone: Principal | ICD-10-CM

## 2018-06-23 LAB — BASIC METABOLIC PANEL
ANION GAP: 8 (ref 5–15)
BUN: 38 mg/dL — ABNORMAL HIGH (ref 8–23)
CO2: 30 mmol/L (ref 22–32)
CREATININE: 1.53 mg/dL — AB (ref 0.61–1.24)
Calcium: 8.8 mg/dL — ABNORMAL LOW (ref 8.9–10.3)
Chloride: 102 mmol/L (ref 98–111)
GFR, EST AFRICAN AMERICAN: 47 mL/min — AB (ref >60)
GFR, EST NON AFRICAN AMERICAN: 41 mL/min — AB (ref >60)
Glucose, Bld: 130 mg/dL — ABNORMAL HIGH (ref 70–99)
Potassium: 4.3 mmol/L (ref 3.5–5.1)
SODIUM: 140 mmol/L (ref 135–145)

## 2018-07-06 ENCOUNTER — Telehealth: Payer: Self-pay | Admitting: Pharmacist

## 2018-07-06 MED FILL — predniSONE 5 MG TABS: 5 | 30 days supply | Qty: 60 | Fill #2

## 2018-07-06 MED FILL — ZYTIGA 250 MG TABLET: 250 | 30 days supply | Qty: 120 | Fill #2

## 2018-07-06 NOTE — Telephone Encounter (Signed)
Oral Chemotherapy Pharmacist Encounter  Follow-Up Form  Called patient today to follow up regarding patient's oral chemotherapy medication: Zytiga/prednisone  Original Start date of oral chemotherapy: 12/2016  Pt reports 0 tablets/doses of Zytiga missed in the last 4 weeks.   Pt reports the following side effects: None reported  Recent labs reviewed: PSA from 06/09/18  New medications?: None reported  Other Issues: None reported  Patient knows to call the office with questions or concerns. Oral Oncology Clinic will continue to follow.  Darl Pikes, PharmD, BCPS, [facility] Hematology/Oncology Clinical Pharmacist [facility] [phone]  07/06/2018 4:14 PM

## 2018-07-07 ENCOUNTER — Inpatient Hospital Stay: Payer: Medicare Other | Attending: Oncology

## 2018-07-07 DIAGNOSIS — C7951 Secondary malignant neoplasm of bone: Secondary | ICD-10-CM | POA: Diagnosis not present

## 2018-07-07 DIAGNOSIS — C61 Malignant neoplasm of prostate: Secondary | ICD-10-CM

## 2018-07-07 LAB — COMPREHENSIVE METABOLIC PANEL
ALBUMIN: 3.3 g/dL — AB (ref 3.5–5.0)
ALK PHOS: 80 U/L (ref 38–126)
ALT: 13 U/L (ref 0–44)
AST: 15 U/L (ref 15–41)
Anion gap: 10 (ref 5–15)
BUN: 34 mg/dL — AB (ref 8–23)
CALCIUM: 8.6 mg/dL — AB (ref 8.9–10.3)
CO2: 29 mmol/L (ref 22–32)
CREATININE: 1.52 mg/dL — AB (ref 0.61–1.24)
Chloride: 99 mmol/L (ref 98–111)
GFR calc Af Amer: 48 mL/min — ABNORMAL LOW (ref >60)
GFR calc non Af Amer: 41 mL/min — ABNORMAL LOW (ref >60)
GLUCOSE: 157 mg/dL — AB (ref 70–99)
Potassium: 4.4 mmol/L (ref 3.5–5.1)
SODIUM: 138 mmol/L (ref 135–145)
Total Bilirubin: 0.5 mg/dL (ref 0.3–1.2)
Total Protein: 6.6 g/dL (ref 6.5–8.1)

## 2018-07-07 LAB — CBC WITH DIFFERENTIAL/PLATELET
BASOS PCT: 1 %
Basophils Absolute: 0.1 10*3/uL (ref 0–0.1)
Eosinophils Absolute: 0 10*3/uL (ref 0–0.7)
Eosinophils Relative: 1 %
HEMATOCRIT: 31.3 % — AB (ref 40.0–52.0)
HEMOGLOBIN: 10.2 g/dL — AB (ref 13.0–18.0)
LYMPHS ABS: 0.5 10*3/uL — AB (ref 1.0–3.6)
LYMPHS PCT: 8 %
MCH: 28.3 pg (ref 26.0–34.0)
MCHC: 32.5 g/dL (ref 32.0–36.0)
MCV: 87 fL (ref 80.0–100.0)
Monocytes Absolute: 0.4 10*3/uL (ref 0.2–1.0)
Monocytes Relative: 6 %
NEUTROS ABS: 5.9 10*3/uL (ref 1.4–6.5)
NEUTROS PCT: 84 %
Platelets: 321 10*3/uL (ref 150–440)
RBC: 3.59 MIL/uL — ABNORMAL LOW (ref 4.40–5.90)
RDW: 16.2 % — ABNORMAL HIGH (ref 11.5–14.5)
WBC: 7 10*3/uL (ref 3.8–10.6)

## 2018-07-07 LAB — PSA: Prostatic Specific Antigen: 0.01 ng/mL (ref 0.00–4.00)

## 2018-07-12 ENCOUNTER — Other Ambulatory Visit: Payer: Self-pay | Admitting: *Deleted

## 2018-07-12 MED ORDER — ONDANSETRON HCL 4 MG PO TABS: 4.0000 mg | ORAL_TABLET | Freq: Three times a day (TID) | ORAL | 3 refills | Status: DC | PRN

## 2018-07-13 MED ORDER — FENTANYL 12 MCG/HR TD PT72: 12.5000 ug | MEDICATED_PATCH | TRANSDERMAL | 0 refills | Status: DC

## 2018-07-13 MED ORDER — OXYCODONE HCL 5 MG PO TABS: 15.0000 mg | ORAL_TABLET | Freq: Four times a day (QID) | ORAL | 0 refills | Status: DC | PRN

## 2018-07-27 ENCOUNTER — Other Ambulatory Visit: Payer: Self-pay | Admitting: Oncology

## 2018-07-27 DIAGNOSIS — C61 Malignant neoplasm of prostate: Secondary | ICD-10-CM

## 2018-07-27 DIAGNOSIS — C7951 Secondary malignant neoplasm of bone: Principal | ICD-10-CM

## 2018-07-31 MED FILL — predniSONE 5 MG TABS: 5 | 30 days supply | Qty: 60 | Fill #3

## 2018-07-31 MED FILL — ZYTIGA 250 MG TABLET: 250 | 30 days supply | Qty: 120 | Fill #0

## 2018-08-04 ENCOUNTER — Inpatient Hospital Stay: Payer: Medicare Other

## 2018-08-04 ENCOUNTER — Encounter: Payer: Self-pay | Admitting: Oncology

## 2018-08-04 ENCOUNTER — Inpatient Hospital Stay: Payer: Medicare Other | Attending: Oncology | Admitting: Oncology

## 2018-08-04 VITALS — BP 99/65 | HR 75 | Temp 98.2°F | Ht 68.0 in | Wt 186.8 lb

## 2018-08-04 DIAGNOSIS — Z5111 Encounter for antineoplastic chemotherapy: Secondary | ICD-10-CM | POA: Diagnosis not present

## 2018-08-04 DIAGNOSIS — N189 Chronic kidney disease, unspecified: Secondary | ICD-10-CM | POA: Diagnosis not present

## 2018-08-04 DIAGNOSIS — D631 Anemia in chronic kidney disease: Secondary | ICD-10-CM

## 2018-08-04 DIAGNOSIS — C7951 Secondary malignant neoplasm of bone: Principal | ICD-10-CM

## 2018-08-04 DIAGNOSIS — C61 Malignant neoplasm of prostate: Secondary | ICD-10-CM

## 2018-08-04 DIAGNOSIS — R42 Dizziness and giddiness: Secondary | ICD-10-CM

## 2018-08-04 DIAGNOSIS — Z87891 Personal history of nicotine dependence: Secondary | ICD-10-CM | POA: Insufficient documentation

## 2018-08-04 DIAGNOSIS — Z79899 Other long term (current) drug therapy: Secondary | ICD-10-CM | POA: Diagnosis not present

## 2018-08-04 DIAGNOSIS — G893 Neoplasm related pain (acute) (chronic): Secondary | ICD-10-CM | POA: Diagnosis not present

## 2018-08-04 DIAGNOSIS — R630 Anorexia: Secondary | ICD-10-CM

## 2018-08-04 DIAGNOSIS — R5383 Other fatigue: Secondary | ICD-10-CM

## 2018-08-04 DIAGNOSIS — M7989 Other specified soft tissue disorders: Secondary | ICD-10-CM

## 2018-08-04 LAB — COMPREHENSIVE METABOLIC PANEL
ALK PHOS: 86 U/L (ref 38–126)
ALT: 13 U/L (ref 0–44)
ANION GAP: 12 (ref 5–15)
AST: 18 U/L (ref 15–41)
Albumin: 3.6 g/dL (ref 3.5–5.0)
BUN: 30 mg/dL — ABNORMAL HIGH (ref 8–23)
CO2: 30 mmol/L (ref 22–32)
CREATININE: 1.57 mg/dL — AB (ref 0.61–1.24)
Calcium: 9.2 mg/dL (ref 8.9–10.3)
Chloride: 98 mmol/L (ref 98–111)
GFR, EST AFRICAN AMERICAN: 46 mL/min — AB (ref >60)
GFR, EST NON AFRICAN AMERICAN: 40 mL/min — AB (ref >60)
Glucose, Bld: 146 mg/dL — ABNORMAL HIGH (ref 70–99)
Potassium: 3.4 mmol/L — ABNORMAL LOW (ref 3.5–5.1)
Sodium: 140 mmol/L (ref 135–145)
TOTAL PROTEIN: 7.1 g/dL (ref 6.5–8.1)
Total Bilirubin: 0.5 mg/dL (ref 0.3–1.2)

## 2018-08-04 LAB — CBC WITH DIFFERENTIAL/PLATELET
BASOS PCT: 1 %
Basophils Absolute: 0.1 10*3/uL (ref 0–0.1)
EOS ABS: 0.1 10*3/uL (ref 0–0.7)
EOS PCT: 1 %
HCT: 31.9 % — ABNORMAL LOW (ref 40.0–52.0)
Hemoglobin: 10.7 g/dL — ABNORMAL LOW (ref 13.0–18.0)
Lymphocytes Relative: 9 %
Lymphs Abs: 0.6 10*3/uL — ABNORMAL LOW (ref 1.0–3.6)
MCH: 28.7 pg (ref 26.0–34.0)
MCHC: 33.4 g/dL (ref 32.0–36.0)
MCV: 85.9 fL (ref 80.0–100.0)
MONO ABS: 0.4 10*3/uL (ref 0.2–1.0)
MONOS PCT: 7 %
NEUTROS ABS: 5.2 10*3/uL (ref 1.4–6.5)
NEUTROS PCT: 82 %
Platelets: 406 10*3/uL (ref 150–440)
RBC: 3.71 MIL/uL — ABNORMAL LOW (ref 4.40–5.90)
RDW: 16.5 % — AB (ref 11.5–14.5)
WBC: 6.4 10*3/uL (ref 3.8–10.6)

## 2018-08-04 MED ORDER — LEUPROLIDE ACETATE (3 MONTH) 22.5 MG IM KIT
22.5000 mg | PACK | Freq: Once | INTRAMUSCULAR | Status: AC
  Administered 2018-08-04: 22.5 mg via INTRAMUSCULAR
  Filled 2018-08-04: qty 22.5

## 2018-08-04 MED ORDER — OXYCODONE HCL 5 MG PO TABS: 15.0000 mg | ORAL_TABLET | Freq: Four times a day (QID) | ORAL | 0 refills | Status: AC | PRN

## 2018-08-04 NOTE — Progress Notes (Signed)
Patient c/o feeling dizzy and light headed

## 2018-08-05 LAB — PSA: Prostatic Specific Antigen: 0.01 ng/mL (ref 0.00–4.00)

## 2018-08-06 NOTE — Progress Notes (Signed)
Hematology/Oncology Consult note [facility]  [phone] [phone]  Patient Care Team: [name], MD as PCP - General (Internal Medicine)   Name of the patient: Johnathan Gould  [id number]  Nov 10, 1934   Date of visit: 08/06/18  Diagnosis- 1. 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 Anemia of chronic kidney disease  Chief complaint/ Reason for visit- routine f/u of protstate cancer on zytiga  Heme/Onc history: patient is a70 year old male was recently admitted to the hospital on 10/16/2016 with symptoms of left hip pain and renal failure. He was found to have bilateral hydronephrosis and is status post bilateral nephrostomy tube placement. CT abdomen showed an irregular polypoid mass at the posterior aspect of the bladder highly concerning for bladder carcinoma. Extensive bulky adenopathy throughout the abdomen and pelvis consistent with nodal metastases. Widespread osseous metastatic disease. Bone scan showed multifocal areas of increased activity throughout the pelvis, bilateral femurs, rib cage, right scapula, thoracic cervical and lumbar spine consistent with metastatic disease. Patient was found to have enlarged abnormal prostate as well as an elevated PSA of 469 and was presumptively diagnosed with metastatic prostate cancer. He was started on Firmagon by urology. He was also seen by radiation oncology and started palliative radiation to his left hip on 11/04/2016 and finished 10 doses of palliative Rt.  He is currently on lupron through usevery 3 months  4 core biopsy obtained for tissue diagnosis which showed: prostate adenocarcinoma gleasons 9 and 10 in all 4 cores  zytiga started in March 2018. He is on fosamax for his osteoporosiswhich was stopped due to heartburn  Patient was admitted to the hospital on 02/05/18 with worsening epigastric pain. He was subsequently found to have acute MITwo-vessel coronary artery  disease, with 80% stenosis proximal LAD, occluded mid left circumflex, subtotal occlusion OM1, and patent stent mid RCAand underwent DES placement   Interval history- lasix was stopped for his leg swelling and was changed to torsemide by nephrology. Since then his leg swelling has improved. However patient has been feeling more fatigued and reports getting light headed when he gets up. Over last few days patient reports no significant appetite. He continues to have low back pain that waxes and wanes but overall controlled with fentanyl patch and prn oxycodone  ECOG PS- 1 Pain scale- 10 Opioid associated constipation- no  Review of systems- Review of Systems  Constitutional: Positive for malaise/fatigue. Negative for chills, fever and weight loss.  HENT: Negative for congestion, ear discharge and nosebleeds.   Eyes: Negative for blurred vision.  Respiratory: Negative for cough, hemoptysis, sputum production, shortness of breath and wheezing.   Cardiovascular: Positive for leg swelling. Negative for chest pain, palpitations, orthopnea and claudication.  Gastrointestinal: Negative for abdominal pain, blood in stool, constipation, diarrhea, heartburn, melena, nausea and vomiting.  Genitourinary: Negative for dysuria, flank pain, frequency, hematuria and urgency.  Musculoskeletal: Positive for back pain. Negative for joint pain and myalgias.  Skin: Negative for rash.  Neurological: Negative for dizziness, tingling, focal weakness, seizures, weakness and headaches.  Endo/Heme/Allergies: Does not bruise/bleed easily.  Psychiatric/Behavioral: Negative for depression and suicidal ideas. The patient does not have insomnia.       No Known Allergies   Past Medical History:  Diagnosis Date  . Arthritis   . CAD (coronary artery disease)   . Hypertension   . Prostate cancer ([place])    metastatic   . RAD (reactive airway disease)      Past Surgical History:  Procedure Laterality Date  .  CORONARY ANGIOPLASTY WITH STENT PLACEMENT    . CORONARY STENT INTERVENTION N/A 02/07/2018   Procedure: CORONARY STENT INTERVENTION;  Surgeon: Isaias Cowman, MD;  Location: [place] [facility];  Service: Cardiovascular;  Laterality: N/A;  . IR GENERIC HISTORICAL  10/17/2016   IR NEPHROSTOMY PLACEMENT RIGHT 10/17/2016 [facility]INTER[facility] RAD  . IR GENERIC HISTORICAL  10/17/2016   IR NEPHROSTOMY PLACEMENT LEFT 10/17/2016 [name], MD [facility]INTER[facility]  . IR GENERIC HISTORICAL  12/23/2016   IR NEPHROSTOMY EXCHANGE LEFT 12/23/2016 [facility]  . IR GENERIC HISTORICAL  12/23/2016   IR NEPHROSTOMY EXCHANGE RIGHT 12/23/2016 [facility]INTER[facility]  . IR NEPHROSTOMY EXCHANGE LEFT  02/11/2017  . IR NEPHROSTOMY EXCHANGE RIGHT  02/11/2017  . KNEE SURGERY Left 2006   Laparascopy done on left knee, scar tissue taken out  . LEFT HEART CATH AND CORONARY ANGIOGRAPHY N/A 02/07/2018   Procedure: LEFT HEART CATH AND CORONARY ANGIOGRAPHY;  Surgeon: Isaias Cowman, MD;  Location: [place] [facility];  Service: Cardiovascular;  Laterality: N/A;  . nephrostomy tubes Bilateral     Social History   Socioeconomic History  . Marital status: Married    Spouse name: Not on file  . Number of children: Not on file  . Years of education: Not on file  . Highest education level: Not on file  Occupational History  . Not on file  Social Needs  . Financial resource strain: Not on file  . Food insecurity:    Worry: Not on file    Inability: Not on file  . Transportation needs:    Medical: Not on file    Non-medical: Not on file  Tobacco Use  . Smoking status: Former Smoker    Packs/day: 1.00    Years: 69.50    Pack years: 69.50    Last attempt to quit: 11/04/2014    Years since quitting: 3.7  . Smokeless tobacco: Never Used  Substance and Sexual Activity  . Alcohol use: No  . Drug use: No  . Sexual activity: Yes  Lifestyle  . Physical activity:    Days per week: Not on file    Minutes per session: Not on  file  . Stress: Not on file  Relationships  . Social connections:    Talks on phone: Not on file    Gets together: Not on file    Attends religious service: Not on file    Active member of club or organization: Not on file    Attends meetings of clubs or organizations: Not on file    Relationship status: Not on file  . Intimate partner violence:    Fear of current or ex partner: Not on file    Emotionally abused: Not on file    Physically abused: Not on file    Forced sexual activity: Not on file  Other Topics Concern  . Not on file  Social History Narrative  . Not on file    Family History  Problem Relation Age of Onset  . Hypertension Other   . Lung cancer Brother      Current Outpatient Medications:  .  alendronate (FOSAMAX) 70 MG tablet, Take 1 tablet (70 mg total) by mouth once a week. Take with a full glass of water on an empty stomach., Disp: 4 tablet, Rfl: 6 .  aspirin EC 81 MG tablet, Take 81 mg by mouth daily. , Disp: , Rfl:  .  atorvastatin (LIPITOR) 40 MG tablet, Take 1 tablet (40  mg total) by mouth daily at 6 PM., Disp: 30 tablet, Rfl: 0 .  budesonide-formoterol (SYMBICORT) 160-4.5 MCG/ACT inhaler, Inhale 2 puffs into the lungs 2 (two) times daily., Disp: 1 Inhaler, Rfl: 0 .  clopidogrel (PLAVIX) 75 MG tablet, Take 75 mg by mouth daily., Disp: , Rfl: 11 .  fentaNYL (DURAGESIC - DOSED MCG/HR) 12 MCG/HR, Place 1 patch (12.5 mcg total) onto the skin every 3 (three) days., Disp: 10 patch, Rfl: 0 .  gabapentin (NEURONTIN) 100 MG capsule, Take 1 capsule (100 mg total) by mouth at bedtime., Disp: 30 capsule, Rfl: 0 .  metoprolol (LOPRESSOR) 50 MG tablet, Take 50 mg by mouth 2 (two) times daily. , Disp: , Rfl:  .  oxyCODONE (OXY IR/ROXICODONE) 5 MG immediate release tablet, Take 3 tablets (15 mg total) by mouth every 6 (six) hours as needed for severe pain., Disp: 360 tablet, Rfl: 0 .  pantoprazole (PROTONIX) 40 MG tablet, Take 1 tablet (40 mg total) by mouth 2 (two) times  daily., Disp: 60 tablet, Rfl: 0 .  predniSONE (DELTASONE) 5 MG tablet, TAKE 1 TABLET (5 MG TOTAL) BY MOUTH 2 TIMES DAILY WITH A MEAL., Disp: 60 tablet, Rfl: 5 .  sennosides-docusate sodium (SENOKOT-S) 8.6-50 MG tablet, Take 2 tablets by mouth 2 (two) times daily. , Disp: , Rfl:  .  tamsulosin (FLOMAX) 0.4 MG CAPS capsule, Take 1 capsule (0.4 mg total) by mouth daily., Disp: 30 capsule, Rfl: 11 .  ZYTIGA 250 MG tablet, TAKE 4 TABLETS (1,000 MG TOTAL) BY MOUTH DAILY. TAKE ON AN EMPTY STOMACH 1 HOUR BEFORE OR 2 HOURS AFTER A MEAL, Disp: 120 tablet, Rfl: 2 .  furosemide (LASIX) 20 MG tablet, TAKE 1 TABLET BY MOUTH ONCE DAILY, Disp: 7 tablet, Rfl: 0 .  ipratropium-albuterol (DUONEB) 0.5-2.5 (3) MG/3ML SOLN, Take 3 mLs by nebulization every 6 (six) hours as needed (sob, wheezing). (Patient not taking: Reported on 04/13/2018), Disp: 360 mL, Rfl: 0 .  nitroGLYCERIN (NITROSTAT) 0.4 MG SL tablet, Place 0.4 mg under the tongue every 5 (five) minutes x 3 doses as needed for chest pain., Disp: , Rfl:  .  ondansetron (ZOFRAN) 4 MG tablet, Take 1 tablet (4 mg total) by mouth every 8 (eight) hours as needed for nausea or vomiting. (Patient not taking: Reported on 08/04/2018), Disp: 30 tablet, Rfl: 3 .  sucralfate (CARAFATE) 1 GM/10ML suspension, Take 10 mLs (1 g total) by mouth 4 (four) times daily -  with meals and at bedtime. (Patient not taking: Reported on 04/13/2018), Disp: 420 mL, Rfl: 0 .  ticagrelor (BRILINTA) 90 MG TABS tablet, Take 1 tablet (90 mg total) by mouth 2 (two) times daily. (Patient not taking: Reported on 04/13/2018), Disp: 60 tablet, Rfl: 0 .  torsemide (DEMADEX) 20 MG tablet, TAKE 1 TABLET BY MOUTH ONCE DAILY IN THE MORNING AS DIRECTED, Disp: , Rfl: 5 No current facility-administered medications for this visit.   Facility-Administered Medications Ordered in Other Visits:  .  0.9 %  sodium chloride infusion, , Intravenous, Continuous, Sindy Guadeloupe, MD, Stopped at 04/28/18 1452 .  [START ON  08/10/2018] Leuprolide Acetate (6 Month) (LUPRON) injection 45 mg, 45 mg, Intramuscular, Q6 months, Sindy Guadeloupe, MD  Physical exam:  Vitals:   08/04/18 1008 08/04/18 1009  BP:  99/65  Pulse:  75  Temp:  98.2 F (36.8 C)  TempSrc:  Tympanic  SpO2:  95%  Weight: 186 lb 12.8 oz (84.7 kg) 186 lb 12.8 oz (84.7 kg)  Height:  5\' 8"  (1.727 m)   Physical Exam  Constitutional: He is oriented to person, place, and time. He appears well-developed and well-nourished.  Elderly gentleman who appears fatigued.   HENT:  Head: Normocephalic and atraumatic.  Eyes: Pupils are equal, round, and reactive to light. EOM are normal.  Neck: Normal range of motion.  Cardiovascular: Normal rate, regular rhythm and normal heart sounds.  Pulmonary/Chest: Effort normal and breath sounds normal.  Abdominal: Soft. Bowel sounds are normal.  Musculoskeletal: He exhibits edema (trace b/l +1).  Neurological: He is alert and oriented to person, place, and time.  Skin: Skin is warm and dry.     CMP Latest Ref Rng & Units 08/04/2018  Glucose 70 - 99 mg/dL 146(H)  BUN 8 - 23 mg/dL 30(H)  Creatinine 0.61 - 1.24 mg/dL 1.57(H)  Sodium 135 - 145 mmol/L 140  Potassium 3.5 - 5.1 mmol/L 3.4(L)  Chloride 98 - 111 mmol/L 98  CO2 22 - 32 mmol/L 30  Calcium 8.9 - 10.3 mg/dL 9.2  Total Protein 6.5 - 8.1 g/dL 7.1  Total Bilirubin 0.3 - 1.2 mg/dL 0.5  Alkaline Phos 38 - 126 U/L 86  AST 15 - 41 U/L 18  ALT 0 - 44 U/L 13   CBC Latest Ref Rng & Units 08/04/2018  WBC 3.8 - 10.6 K/uL 6.4  Hemoglobin 13.0 - 18.0 g/dL 10.7(L)  Hematocrit 40.0 - 52.0 % 31.9(L)  Platelets 150 - 440 K/uL 406     Assessment and plan- Patient is a 83 y.o. male with following issues:  1. Castrate sensitive prostate cancer: he will receive lupron today. Continue zytiga. psa is at nadir 0.01. Will consider repeating scans psa starts trending up again or in 3 months time  2. Anemia of ckd: stable around 10. Continue to monitor  3. CKD: being  followed by nephrology. Stable around 1.5  4. Leg swelling. Improved after starting torsemide. However patient reports symptoms of orthostatic hypotension. Hie systolic BP is 99 today. I have asked himt o discuss this further with cardiology and nephrology  5. Neoplasm related pain: oxycodone renewed today  I will see him back in 2 months with cbc/cmp and PSA    Visit Diagnosis 1. Prostate cancer metastatic to bone ([place])   2. High risk medication use   3. Neoplasm related pain      Dr. Randa Evens, MD, MPH [facility] at [facility] [phone] 08/06/2018 11:17 AM

## 2018-08-08 ENCOUNTER — Other Ambulatory Visit: Payer: Self-pay | Admitting: *Deleted

## 2018-08-09 ENCOUNTER — Encounter: Payer: Self-pay | Admitting: Emergency Medicine

## 2018-08-09 ENCOUNTER — Emergency Department
Admission: EM | Admit: 2018-08-09 | Discharge: 2018-08-09 | Disposition: A | Discharge status: Home / Self Care | Payer: Medicare Other | Attending: Emergency Medicine | Admitting: Emergency Medicine

## 2018-08-09 ENCOUNTER — Emergency Department: Payer: Medicare Other

## 2018-08-09 DIAGNOSIS — Z9221 Personal history of antineoplastic chemotherapy: Secondary | ICD-10-CM | POA: Insufficient documentation

## 2018-08-09 DIAGNOSIS — Y939 Activity, unspecified: Secondary | ICD-10-CM | POA: Insufficient documentation

## 2018-08-09 DIAGNOSIS — S0591XA Unspecified injury of right eye and orbit, initial encounter: Secondary | ICD-10-CM | POA: Diagnosis present

## 2018-08-09 DIAGNOSIS — Z7902 Long term (current) use of antithrombotics/antiplatelets: Secondary | ICD-10-CM | POA: Diagnosis not present

## 2018-08-09 DIAGNOSIS — Z7982 Long term (current) use of aspirin: Secondary | ICD-10-CM | POA: Insufficient documentation

## 2018-08-09 DIAGNOSIS — Y929 Unspecified place or not applicable: Secondary | ICD-10-CM | POA: Diagnosis not present

## 2018-08-09 DIAGNOSIS — Z8546 Personal history of malignant neoplasm of prostate: Secondary | ICD-10-CM | POA: Diagnosis not present

## 2018-08-09 DIAGNOSIS — I252 Old myocardial infarction: Secondary | ICD-10-CM | POA: Insufficient documentation

## 2018-08-09 DIAGNOSIS — S0501XA Injury of conjunctiva and corneal abrasion without foreign body, right eye, initial encounter: Secondary | ICD-10-CM | POA: Diagnosis not present

## 2018-08-09 DIAGNOSIS — I10 Essential (primary) hypertension: Secondary | ICD-10-CM | POA: Diagnosis not present

## 2018-08-09 DIAGNOSIS — Z87891 Personal history of nicotine dependence: Secondary | ICD-10-CM | POA: Diagnosis not present

## 2018-08-09 DIAGNOSIS — I2510 Atherosclerotic heart disease of native coronary artery without angina pectoris: Secondary | ICD-10-CM | POA: Insufficient documentation

## 2018-08-09 DIAGNOSIS — X58XXXA Exposure to other specified factors, initial encounter: Secondary | ICD-10-CM | POA: Diagnosis not present

## 2018-08-09 DIAGNOSIS — L03213 Periorbital cellulitis: Secondary | ICD-10-CM | POA: Diagnosis not present

## 2018-08-09 DIAGNOSIS — Y999 Unspecified external cause status: Secondary | ICD-10-CM | POA: Insufficient documentation

## 2018-08-09 DIAGNOSIS — Z79899 Other long term (current) drug therapy: Secondary | ICD-10-CM | POA: Diagnosis not present

## 2018-08-09 LAB — CBC WITH DIFFERENTIAL/PLATELET
Abs Immature Granulocytes: 0.05 10*3/uL (ref 0.00–0.07)
BASOS ABS: 0 10*3/uL (ref 0.0–0.1)
Basophils Relative: 0 %
EOS PCT: 1 %
Eosinophils Absolute: 0 10*3/uL (ref 0.0–0.5)
HCT: 30.1 % — ABNORMAL LOW (ref 39.0–52.0)
HEMOGLOBIN: 9.3 g/dL — AB (ref 13.0–17.0)
IMMATURE GRANULOCYTES: 1 %
LYMPHS ABS: 0.5 10*3/uL — AB (ref 0.7–4.0)
Lymphocytes Relative: 7 %
MCH: 27.8 pg (ref 26.0–34.0)
MCHC: 30.9 g/dL (ref 30.0–36.0)
MCV: 90.1 fL (ref 80.0–100.0)
Monocytes Absolute: 0.5 10*3/uL (ref 0.1–1.0)
Monocytes Relative: 6 %
NEUTROS PCT: 85 %
NRBC: 0 % (ref 0.0–0.2)
Neutro Abs: 7 10*3/uL (ref 1.7–7.7)
Platelets: 321 10*3/uL (ref 150–400)
RBC: 3.34 MIL/uL — AB (ref 4.22–5.81)
RDW: 15.9 % — AB (ref 11.5–15.5)
WBC: 8.1 10*3/uL (ref 4.0–10.5)

## 2018-08-09 LAB — BASIC METABOLIC PANEL
ANION GAP: 10 (ref 5–15)
BUN: 31 mg/dL — ABNORMAL HIGH (ref 8–23)
CALCIUM: 8.1 mg/dL — AB (ref 8.9–10.3)
CO2: 27 mmol/L (ref 22–32)
CREATININE: 1.51 mg/dL — AB (ref 0.61–1.24)
Chloride: 103 mmol/L (ref 98–111)
GFR calc non Af Amer: 42 mL/min — ABNORMAL LOW (ref >60)
GFR, EST AFRICAN AMERICAN: 48 mL/min — AB (ref >60)
Glucose, Bld: 125 mg/dL — ABNORMAL HIGH (ref 70–99)
Potassium: 3.1 mmol/L — ABNORMAL LOW (ref 3.5–5.1)
SODIUM: 140 mmol/L (ref 135–145)

## 2018-08-09 MED ORDER — ERYTHROMYCIN 5 MG/GM OP OINT: 1.0000 "application " | TOPICAL_OINTMENT | Freq: Three times a day (TID) | OPHTHALMIC | 0 refills | Status: AC

## 2018-08-09 MED ORDER — ERYTHROMYCIN 5 MG/GM OP OINT: 1.0000 "application " | TOPICAL_OINTMENT | Freq: Three times a day (TID) | OPHTHALMIC | 0 refills | Status: DC

## 2018-08-09 MED ORDER — IOHEXOL 300 MG/ML  SOLN
75.0000 mL | Freq: Once | INTRAMUSCULAR | Status: AC | PRN
  Administered 2018-08-09: 60 mL via INTRAVENOUS

## 2018-08-09 MED ORDER — CLINDAMYCIN HCL 300 MG PO CAPS: 300.0000 mg | ORAL_CAPSULE | Freq: Four times a day (QID) | ORAL | 0 refills | Status: DC

## 2018-08-09 MED ORDER — ERYTHROMYCIN 5 MG/GM OP OINT
TOPICAL_OINTMENT | Freq: Once | OPHTHALMIC | Status: AC
  Administered 2018-08-09: 1 via OPHTHALMIC
  Filled 2018-08-09: qty 1

## 2018-08-09 MED ORDER — TETRACAINE HCL 0.5 % OP SOLN
2.0000 [drp] | Freq: Once | OPHTHALMIC | Status: AC
  Administered 2018-08-09: 2 [drp] via OPHTHALMIC
  Filled 2018-08-09: qty 4

## 2018-08-09 MED ORDER — FLUORESCEIN SODIUM 1 MG OP STRP
1.0000 | ORAL_STRIP | Freq: Once | OPHTHALMIC | Status: AC
  Administered 2018-08-09: 1 via OPHTHALMIC
  Filled 2018-08-09: qty 1

## 2018-08-09 MED ORDER — CLINDAMYCIN HCL 150 MG PO CAPS
300.0000 mg | ORAL_CAPSULE | Freq: Once | ORAL | Status: AC
  Administered 2018-08-09: 300 mg via ORAL
  Filled 2018-08-09: qty 2

## 2018-08-09 NOTE — ED Notes (Signed)
Nurse sent new lab specimen to lab.

## 2018-08-09 NOTE — ED Triage Notes (Addendum)
PT arrived with complaints of red eye for a week but today when he woke up his right eye more red and was causing him immense pain. Pt takes blood thinner and reports stage 4 cancer.

## 2018-08-09 NOTE — Discharge Instructions (Signed)
Use erythromycin ointment three times daily.   Take clindamycin four times daily for a week   See eye doctor this week   You have an eyelid infection and an abrasion on your eye   Return to ER if you have worse eye swelling, blurry vision, fever, purulent drainage

## 2018-08-09 NOTE — ED Notes (Signed)
Pt returned from CT scan.

## 2018-08-09 NOTE — ED Provider Notes (Signed)
[place] EMERGENCY DEPARTMENT Provider Note   CSN: [id number] Arrival date & time: 08/09/18  0350     History   Chief Complaint Chief Complaint  Patient presents with  . Eye Pain    HPI RAEQUAN VANSCHAICK is a 83 y.o. male hx of CAD, HTN, stage 4 prostate cancer on oral chemo here with R eye pain and redness.  Patient states that about a week ago, he noticed some redness in his right eye.  He states that it was similar to his previous subconjunctival hemorrhage.  He states that the redness actually improved until this morning.  He woke up and he had severe right eye pain.  Noticed that the redness had gotten much worse.  Denies any blurry vision or history of glaucoma.  Denies any trauma or injury to the eye.    The history is provided by the patient.    Past Medical History:  Diagnosis Date  . Arthritis   . CAD (coronary artery disease)   . Hypertension   . Prostate cancer ([place])    metastatic   . RAD (reactive airway disease)     Patient Active Problem List   Diagnosis Date Noted  . Palliative care by specialist   . NSTEMI (non-ST elevated myocardial infarction) ([place]) 02/06/2018  . Epigastric abdominal pain 02/05/2018  . Goals of care, counseling/discussion 12/10/2016  . Anemia of chronic renal failure 11/22/2016  . Hypotension 11/20/2016  . Dehydration 11/20/2016  . Urinary retention 11/20/2016  . Prostate cancer metastatic to bone ([place]) 11/20/2016  . Prostate cancer ([place])   . Urinary obstruction 10/16/2016  . Acute renal failure (ARF) ([place]) 10/16/2016  . Bladder mass 10/16/2016  . CAD (coronary artery disease) 10/16/2016  . HTN (hypertension) 10/16/2016    Past Surgical History:  Procedure Laterality Date  . CORONARY ANGIOPLASTY WITH STENT PLACEMENT    . CORONARY STENT INTERVENTION N/A 02/07/2018   Procedure: CORONARY STENT INTERVENTION;  Surgeon: Isaias Cowman, MD;  Location: [place] [facility];  Service: Cardiovascular;   Laterality: N/A;  . IR GENERIC HISTORICAL  10/17/2016   IR NEPHROSTOMY PLACEMENT RIGHT 10/17/2016 [facility]INTER[facility] RAD  . IR GENERIC HISTORICAL  10/17/2016   IR NEPHROSTOMY PLACEMENT LEFT 10/17/2016 [name], MD [facility]INTER[facility]  . IR GENERIC HISTORICAL  12/23/2016   IR NEPHROSTOMY EXCHANGE LEFT 12/23/2016 [facility]INTER[facility]  . IR GENERIC HISTORICAL  12/23/2016   IR NEPHROSTOMY EXCHANGE RIGHT 12/23/2016 [facility]  . IR NEPHROSTOMY EXCHANGE LEFT  02/11/2017  . IR NEPHROSTOMY EXCHANGE RIGHT  02/11/2017  . KNEE SURGERY Left 2006   Laparascopy done on left knee, scar tissue taken out  . LEFT HEART CATH AND CORONARY ANGIOGRAPHY N/A 02/07/2018   Procedure: LEFT HEART CATH AND CORONARY ANGIOGRAPHY;  Surgeon: Isaias Cowman, MD;  Location: [place] [facility];  Service: Cardiovascular;  Laterality: N/A;  . nephrostomy tubes Bilateral         Home Medications    Prior to Admission medications   Medication Sig Start Date End Date Taking? Authorizing Provider  alendronate (FOSAMAX) 70 MG tablet Take 1 tablet (70 mg total) by mouth once a week. Take with a full glass of water on an empty stomach. 10/07/17   Sindy Guadeloupe, MD  aspirin EC 81 MG tablet Take 81 mg by mouth daily.     [provider]  atorvastatin (LIPITOR) 40 MG tablet Take 1 tablet (40 mg total) by mouth daily at 6 PM. 02/10/18   Salary, Holly Bodily D,  MD  budesonide-formoterol (SYMBICORT) 160-4.5 MCG/ACT inhaler Inhale 2 puffs into the lungs 2 (two) times daily. 02/10/18   Salary, Holly Bodily D, MD  clopidogrel (PLAVIX) 75 MG tablet Take 75 mg by mouth daily. 03/24/18   [provider]  fentaNYL (DURAGESIC - DOSED MCG/HR) 12 MCG/HR Place 1 patch (12.5 mcg total) onto the skin every 3 (three) days. 07/13/18   Sindy Guadeloupe, MD  furosemide (LASIX) 20 MG tablet TAKE 1 TABLET BY MOUTH ONCE DAILY 05/22/18   Sindy Guadeloupe, MD  gabapentin (NEURONTIN) 100 MG capsule Take 1 capsule (100 mg total) by mouth at bedtime. 02/10/18    Salary, Avel Peace, MD  ipratropium-albuterol (DUONEB) 0.5-2.5 (3) MG/3ML SOLN Take 3 mLs by nebulization every 6 (six) hours as needed (sob, wheezing). Patient not taking: Reported on 04/13/2018 02/10/18   Salary, Holly Bodily D, MD  metoprolol (LOPRESSOR) 50 MG tablet Take 50 mg by mouth 2 (two) times daily.  12/13/16   [provider]  nitroGLYCERIN (NITROSTAT) 0.4 MG SL tablet Place 0.4 mg under the tongue every 5 (five) minutes x 3 doses as needed for chest pain. 06/23/16   [provider]  ondansetron (ZOFRAN) 4 MG tablet Take 1 tablet (4 mg total) by mouth every 8 (eight) hours as needed for nausea or vomiting. Patient not taking: Reported on 08/04/2018 07/12/18   Sindy Guadeloupe, MD  oxyCODONE (OXY IR/ROXICODONE) 5 MG immediate release tablet Take 3 tablets (15 mg total) by mouth every 6 (six) hours as needed for severe pain. 08/04/18 09/03/18  Sindy Guadeloupe, MD  pantoprazole (PROTONIX) 40 MG tablet Take 1 tablet (40 mg total) by mouth 2 (two) times daily. 02/10/18   Salary, Avel Peace, MD  predniSONE (DELTASONE) 5 MG tablet TAKE 1 TABLET (5 MG TOTAL) BY MOUTH 2 TIMES DAILY WITH A MEAL. 05/09/18   Sindy Guadeloupe, MD  sennosides-docusate sodium (SENOKOT-S) 8.6-50 MG tablet Take 2 tablets by mouth 2 (two) times daily.     [provider]  sucralfate (CARAFATE) 1 GM/10ML suspension Take 10 mLs (1 g total) by mouth 4 (four) times daily -  with meals and at bedtime. Patient not taking: Reported on 04/13/2018 02/10/18   Salary, Holly Bodily D, MD  tamsulosin (FLOMAX) 0.4 MG CAPS capsule Take 1 capsule (0.4 mg total) by mouth daily. 09/30/17   Hollice Espy, MD  ticagrelor (BRILINTA) 90 MG TABS tablet Take 1 tablet (90 mg total) by mouth 2 (two) times daily. Patient not taking: Reported on 04/13/2018 02/10/18   Salary, Holly Bodily D, MD  torsemide (DEMADEX) 20 MG tablet TAKE 1 TABLET BY MOUTH ONCE DAILY IN THE MORNING AS DIRECTED 07/13/18   [provider]  ZYTIGA 250 MG tablet TAKE 4 TABLETS  (1,000 MG TOTAL) BY MOUTH DAILY. TAKE ON AN EMPTY STOMACH 1 HOUR BEFORE OR 2 HOURS AFTER A MEAL 07/27/18   Sindy Guadeloupe, MD    Family History Family History  Problem Relation Age of Onset  . Hypertension Other   . Lung cancer Brother     Social History Social History   Tobacco Use  . Smoking status: Former Smoker    Packs/day: 1.00    Years: 69.50    Pack years: 69.50    Last attempt to quit: 11/04/2014    Years since quitting: 3.7  . Smokeless tobacco: Never Used  Substance Use Topics  . Alcohol use: No  . Drug use: No     Allergies   Patient has no known  allergies.   Review of Systems Review of Systems  Eyes: Positive for pain and redness.  All other systems reviewed and are negative.    Physical Exam Updated Vital Signs BP 139/80 (BP Location: Left Arm)   Pulse 63   Temp 97.7 F (36.5 C) (Oral)   Resp 18   Ht 5\' 8"  (1.727 m)   Wt 84.7 kg   SpO2 99%   BMI 28.39 kg/m   Physical Exam  Constitutional: He is oriented to person, place, and time.  Slightly uncomfortable   HENT:  Head: Normocephalic.  Mouth/Throat: Oropharynx is clear and moist.  Eyes:  R subconjunctival hemorrhage. No obvious hyphema. Diffuse eyelid swelling, extra ocular movements intact. There is small corneal abrasion on right eye at 3 o'clock position, no foreign body under the lid. The eye pressure is 25 R eye and 23 L eye   Neck: Normal range of motion. Neck supple.  Cardiovascular: Normal rate and regular rhythm.  Pulmonary/Chest: Effort normal and breath sounds normal.  Abdominal: Soft. Bowel sounds are normal.  Musculoskeletal: Normal range of motion.  Neurological: He is alert and oriented to person, place, and time. No cranial nerve deficit. Coordination normal.  Skin: Skin is warm.  Psychiatric: He has a normal mood and affect.  Nursing note and vitals reviewed.    ED Treatments / Results  Labs (all labs ordered are listed, but only abnormal results are displayed) Labs  Reviewed  CBC WITH DIFFERENTIAL/PLATELET  BASIC METABOLIC PANEL    EKG None  Radiology No results found.  Procedures Procedures (including critical care time)  Medications Ordered in ED Medications  tetracaine (PONTOCAINE) 0.5 % ophthalmic solution 2 drop (has no administration in time range)  fluorescein ophthalmic strip 1 strip (has no administration in time range)     Initial Impression / Assessment and Plan / ED Course  I have reviewed the triage vital signs and the nursing notes.  Pertinent labs & imaging results that were available during my care of the patient were reviewed by me and considered in my medical decision making (see chart for details).     RAKAN SOFFER is a 83 y.o. male here with R subconjunctival hemorrhage. Has R eye pain as well but no trauma or injury. Small corneal abrasion on exam, no foreign body visualized. Eye pressure normal bilaterally. Will get labs, CT orbits to r/o orbital vs preseptal cellulitis   12:20 PM WBC nl. CT showed R preseptal periorbital cellulitis. Will dc home with clindamycin, erythromycin drops, ophtho follow up    Final Clinical Impressions(s) / ED Diagnoses   Final diagnoses:  None    ED Discharge Orders    None       Drenda Freeze, MD 08/09/18 1220

## 2018-08-09 NOTE — ED Triage Notes (Signed)
First Nurse Note:  C/O right eye redness x 1 week.  Initially redness was a small area medially.  States redness worsened today and also c/o pain to right eye.

## 2018-08-11 ENCOUNTER — Other Ambulatory Visit: Payer: Self-pay | Admitting: *Deleted

## 2018-08-11 DIAGNOSIS — Z79899 Other long term (current) drug therapy: Secondary | ICD-10-CM

## 2018-08-11 DIAGNOSIS — C61 Malignant neoplasm of prostate: Secondary | ICD-10-CM

## 2018-08-11 DIAGNOSIS — C7951 Secondary malignant neoplasm of bone: Principal | ICD-10-CM

## 2018-08-15 ENCOUNTER — Telehealth: Payer: Self-pay

## 2018-08-15 ENCOUNTER — Other Ambulatory Visit: Payer: Self-pay | Admitting: *Deleted

## 2018-08-15 MED ORDER — FENTANYL 12 MCG/HR TD PT72: 12.5000 ug | MEDICATED_PATCH | TRANSDERMAL | 0 refills | Status: DC

## 2018-08-15 NOTE — Telephone Encounter (Signed)
Left Message - To inform the patient that the Oxycodone 5 mg / 15 mg will not be ready until Thursday 08/17/18. Per [place] on  [address]. [phone]

## 2018-08-15 NOTE — Telephone Encounter (Signed)
Patient came into clinic today needing a refill of his oxycodone.  We did send the prescription on 10/ 4 however it was early.  I have called his pharmacy and they do not have the oxycodone in today, they will get a shipment hopefully the oxycodone will be on it.  Patient knows that we will check in the afternoon to see if the supply is there and let him know to come pick it up or he will have to wait till Thursday.  Patient is agreeable to this plan.  Also patient is due to have Duragesic patch and I have put it in for Dr. [name] to send to pharmacy. patient is aware

## 2018-08-25 MED FILL — predniSONE 5 MG TABS: 5 | 30 days supply | Qty: 60 | Fill #4

## 2018-08-25 MED FILL — ZYTIGA 250 MG TABLET: 250 | 30 days supply | Qty: 120 | Fill #1

## 2018-09-01 ENCOUNTER — Other Ambulatory Visit: Payer: Medicare Other

## 2018-09-06 ENCOUNTER — Ambulatory Visit
Admission: RE | Admit: 2018-09-06 | Discharge: 2018-09-06 | Disposition: A | Discharge status: Home / Self Care | Payer: Medicare Other | Source: Ambulatory Visit | Attending: Oncology | Admitting: Oncology

## 2018-09-06 DIAGNOSIS — Z13820 Encounter for screening for osteoporosis: Secondary | ICD-10-CM | POA: Insufficient documentation

## 2018-09-06 DIAGNOSIS — C7951 Secondary malignant neoplasm of bone: Secondary | ICD-10-CM | POA: Insufficient documentation

## 2018-09-06 DIAGNOSIS — M810 Age-related osteoporosis without current pathological fracture: Secondary | ICD-10-CM | POA: Insufficient documentation

## 2018-09-06 DIAGNOSIS — C61 Malignant neoplasm of prostate: Secondary | ICD-10-CM | POA: Diagnosis not present

## 2018-09-06 DIAGNOSIS — Z79899 Other long term (current) drug therapy: Secondary | ICD-10-CM

## 2018-09-08 ENCOUNTER — Ambulatory Visit: Payer: Medicare Other | Admitting: Oncology

## 2018-09-19 ENCOUNTER — Other Ambulatory Visit: Payer: Self-pay | Admitting: *Deleted

## 2018-09-19 MED ORDER — OXYCODONE HCL 5 MG PO TABS: 15.0000 mg | ORAL_TABLET | Freq: Four times a day (QID) | ORAL | 0 refills | Status: DC | PRN

## 2018-09-19 MED ORDER — FENTANYL 25 MCG/HR TD PT72: 25.0000 ug | MEDICATED_PATCH | TRANSDERMAL | 0 refills | Status: DC

## 2018-09-19 NOTE — Telephone Encounter (Signed)
Came in the office today to ask for refill of his breakthrough pain medicine as well as his fentanyl patches.  He states that he is using the breakthrough medicine 3 pills at a time every 6 hours and sometimes 1 pill in between because the pain is so bad.  The pain is mostly in his abdomen and groin area.  Spoke with Dr. [name] she would like to increase the Duragesic patch to a 25 mcg.  Refill oxycondone 5 mg tablets and take 3 tablets every 6 hours as needed.. Pt agreeable and will pick up rx at his pharmacy.

## 2018-09-21 MED FILL — predniSONE 5 MG TABS: 5 | 30 days supply | Qty: 60 | Fill #5

## 2018-09-21 MED FILL — ZYTIGA 250 MG TABLET: 250 | 30 days supply | Qty: 120 | Fill #2

## 2018-10-06 ENCOUNTER — Other Ambulatory Visit: Payer: Self-pay

## 2018-10-06 ENCOUNTER — Encounter: Payer: Self-pay | Admitting: Oncology

## 2018-10-06 ENCOUNTER — Inpatient Hospital Stay: Payer: Medicare Other

## 2018-10-06 ENCOUNTER — Inpatient Hospital Stay: Payer: Medicare Other | Attending: Oncology | Admitting: Oncology

## 2018-10-06 ENCOUNTER — Ambulatory Visit: Payer: Medicare Other

## 2018-10-06 ENCOUNTER — Other Ambulatory Visit: Payer: Self-pay | Admitting: *Deleted

## 2018-10-06 VITALS — BP 135/80 | HR 77 | Temp 98.1°F | Resp 16 | Ht 68.0 in | Wt 188.0 lb

## 2018-10-06 DIAGNOSIS — C7951 Secondary malignant neoplasm of bone: Principal | ICD-10-CM

## 2018-10-06 DIAGNOSIS — C61 Malignant neoplasm of prostate: Secondary | ICD-10-CM

## 2018-10-06 DIAGNOSIS — N189 Chronic kidney disease, unspecified: Secondary | ICD-10-CM | POA: Diagnosis not present

## 2018-10-06 DIAGNOSIS — D649 Anemia, unspecified: Secondary | ICD-10-CM | POA: Diagnosis not present

## 2018-10-06 DIAGNOSIS — R110 Nausea: Secondary | ICD-10-CM

## 2018-10-06 DIAGNOSIS — M7989 Other specified soft tissue disorders: Secondary | ICD-10-CM | POA: Diagnosis not present

## 2018-10-06 DIAGNOSIS — Z87891 Personal history of nicotine dependence: Secondary | ICD-10-CM | POA: Diagnosis not present

## 2018-10-06 DIAGNOSIS — Z79899 Other long term (current) drug therapy: Secondary | ICD-10-CM

## 2018-10-06 LAB — COMPREHENSIVE METABOLIC PANEL
ALBUMIN: 3.3 g/dL — AB (ref 3.5–5.0)
ALK PHOS: 76 U/L (ref 38–126)
ALT: 11 U/L (ref 0–44)
ANION GAP: 11 (ref 5–15)
AST: 15 U/L (ref 15–41)
BILIRUBIN TOTAL: 0.4 mg/dL (ref 0.3–1.2)
BUN: 23 mg/dL (ref 8–23)
CALCIUM: 9.3 mg/dL (ref 8.9–10.3)
CO2: 31 mmol/L (ref 22–32)
Chloride: 100 mmol/L (ref 98–111)
Creatinine, Ser: 1.41 mg/dL — ABNORMAL HIGH (ref 0.61–1.24)
GFR calc Af Amer: 54 mL/min — ABNORMAL LOW (ref >60)
GFR, EST NON AFRICAN AMERICAN: 46 mL/min — AB (ref >60)
Glucose, Bld: 146 mg/dL — ABNORMAL HIGH (ref 70–99)
Potassium: 3.9 mmol/L (ref 3.5–5.1)
Sodium: 142 mmol/L (ref 135–145)
TOTAL PROTEIN: 7 g/dL (ref 6.5–8.1)

## 2018-10-06 LAB — CBC WITH DIFFERENTIAL/PLATELET
Abs Immature Granulocytes: 0.03 10*3/uL (ref 0.00–0.07)
BASOS PCT: 0 %
Basophils Absolute: 0 10*3/uL (ref 0.0–0.1)
EOS ABS: 0.1 10*3/uL (ref 0.0–0.5)
Eosinophils Relative: 2 %
HCT: 32 % — ABNORMAL LOW (ref 39.0–52.0)
Hemoglobin: 9.8 g/dL — ABNORMAL LOW (ref 13.0–17.0)
Immature Granulocytes: 1 %
Lymphocytes Relative: 14 %
Lymphs Abs: 0.8 10*3/uL (ref 0.7–4.0)
MCH: 27.5 pg (ref 26.0–34.0)
MCHC: 30.6 g/dL (ref 30.0–36.0)
MCV: 89.9 fL (ref 80.0–100.0)
MONO ABS: 0.4 10*3/uL (ref 0.1–1.0)
Monocytes Relative: 7 %
Neutro Abs: 4.2 10*3/uL (ref 1.7–7.7)
Neutrophils Relative %: 76 %
PLATELETS: 354 10*3/uL (ref 150–400)
RBC: 3.56 MIL/uL — ABNORMAL LOW (ref 4.22–5.81)
RDW: 14.8 % (ref 11.5–15.5)
WBC: 5.5 10*3/uL (ref 4.0–10.5)
nRBC: 0 % (ref 0.0–0.2)

## 2018-10-06 LAB — PSA: Prostatic Specific Antigen: 0.01 ng/mL (ref 0.00–4.00)

## 2018-10-06 MED ORDER — ONDANSETRON 4 MG PO TBDP: 4.0000 mg | ORAL_TABLET | Freq: Three times a day (TID) | ORAL | 2 refills | Status: AC | PRN

## 2018-10-06 NOTE — Progress Notes (Signed)
Hematology/Oncology Consult note [facility]  Telephone:([phone] [phone]  Patient Care Team: [name], MD as PCP - General (Internal Medicine)   Name of the patient: Johnathan Gould  [id number]  Feb 03, 1935   Date of visit: 10/06/18  Diagnosis- 1. 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 Anemia of chronic kidney disease   Chief complaint/ Reason for visit- routine f/u of prostate cancer  Heme/Onc history: patient is an 83 year old male was recently admitted to the hospital on 10/16/2016 with symptoms of left hip pain and renal failure. He was found to have bilateral hydronephrosis and is status post bilateral nephrostomy tube placement. CT abdomen showed an irregular polypoid mass at the posterior aspect of the bladder highly concerning for bladder carcinoma. Extensive bulky adenopathy throughout the abdomen and pelvis consistent with nodal metastases. Widespread osseous metastatic disease. Bone scan showed multifocal areas of increased activity throughout the pelvis, bilateral femurs, rib cage, right scapula, thoracic cervical and lumbar spine consistent with metastatic disease. Patient was found to have enlarged abnormal prostate as well as an elevated PSA of 469 and was presumptively diagnosed with metastatic prostate cancer. He was started on Firmagon by urology. He was also seen by radiation oncology and started palliative radiation to his left hip on 11/04/2016 and finished 10 doses of palliative Rt.  He is currently on lupron through usevery 3 months  4 core biopsy obtained for tissue diagnosis which showed: prostate adenocarcinoma gleasons 9 and 10 in all 4 cores  zytiga started in March 2018. He is on fosamax for his osteoporosiswhich was stopped due to heartburn  Patient was admitted to the hospital on 02/05/18 with worsening epigastric pain. He was subsequently found to have acute MITwo-vessel coronary artery disease,  with 80% stenosis proximal LAD, occluded mid left circumflex, subtotal occlusion OM1, and patent stent mid RCAand underwent DES placement    Interval history-patient states that he has not been feeling good over the last couple of weeks.  He does feel nauseous on and off and he has not been able to eat well.  States that the Zofran helps him more than the Compazine with his nausea.  Denies any chest pain.  He also has bilateral lower extremity swelling for which she follows up with cardiology as well as nephrology reports that he hurts all over  ECOG PS- 1-2 Pain scale- 7 Opioid associated constipation- no  Review of systems- Review of Systems  Constitutional: Positive for malaise/fatigue. Negative for chills, fever and weight loss.  HENT: Negative for congestion, ear discharge and nosebleeds.   Eyes: Negative for blurred vision.  Respiratory: Negative for cough, hemoptysis, sputum production, shortness of breath and wheezing.   Cardiovascular: Negative for chest pain, palpitations, orthopnea and claudication.  Gastrointestinal: Positive for nausea. Negative for abdominal pain, blood in stool, constipation, diarrhea, heartburn, melena and vomiting.  Genitourinary: Negative for dysuria, flank pain, frequency, hematuria and urgency.  Musculoskeletal: Positive for back pain and joint pain. Negative for myalgias.  Skin: Negative for rash.  Neurological: Negative for dizziness, tingling, focal weakness, seizures, weakness and headaches.  Endo/Heme/Allergies: Does not bruise/bleed easily.  Psychiatric/Behavioral: Negative for depression and suicidal ideas. The patient does not have insomnia.       No Known Allergies   Past Medical History:  Diagnosis Date  . Arthritis   . CAD (coronary artery disease)   . Hypertension   . Prostate cancer ([place])    metastatic   . RAD (reactive airway disease)  Past Surgical History:  Procedure Laterality Date  . CORONARY ANGIOPLASTY WITH  STENT PLACEMENT    . CORONARY STENT INTERVENTION N/A 02/07/2018   Procedure: CORONARY STENT INTERVENTION;  Surgeon: Isaias Cowman, MD;  Location: [place] [facility];  Service: Cardiovascular;  Laterality: N/A;  . IR GENERIC HISTORICAL  10/17/2016   IR NEPHROSTOMY PLACEMENT RIGHT 10/17/2016 [facility]  . IR GENERIC HISTORICAL  10/17/2016   IR NEPHROSTOMY PLACEMENT LEFT 10/17/2016 [name], MD [facility]  . IR GENERIC HISTORICAL  12/23/2016   IR NEPHROSTOMY EXCHANGE LEFT 12/23/2016 [facility]  . IR GENERIC HISTORICAL  12/23/2016   IR NEPHROSTOMY EXCHANGE RIGHT 12/23/2016 [facility]  . IR NEPHROSTOMY EXCHANGE LEFT  02/11/2017  . IR NEPHROSTOMY EXCHANGE RIGHT  02/11/2017  . KNEE SURGERY Left 2006   Laparascopy done on left knee, scar tissue taken out  . LEFT HEART CATH AND CORONARY ANGIOGRAPHY N/A 02/07/2018   Procedure: LEFT HEART CATH AND CORONARY ANGIOGRAPHY;  Surgeon: Isaias Cowman, MD;  Location: [place] [facility];  Service: Cardiovascular;  Laterality: N/A;  . nephrostomy tubes Bilateral     Social History   Socioeconomic History  . Marital status: Married    Spouse name: Not on file  . Number of children: Not on file  . Years of education: Not on file  . Highest education level: Not on file  Occupational History  . Not on file  Social Needs  . Financial resource strain: Not on file  . Food insecurity:    Worry: Not on file    Inability: Not on file  . Transportation needs:    Medical: Not on file    Non-medical: Not on file  Tobacco Use  . Smoking status: Former Smoker    Packs/day: 1.00    Years: 69.50    Pack years: 69.50    Last attempt to quit: 11/04/2014    Years since quitting: 3.9  . Smokeless tobacco: Never Used  Substance and Sexual Activity  . Alcohol use: No  . Drug use: No  . Sexual activity: Yes  Lifestyle  . Physical activity:    Days per week: Not on file    Minutes per session: Not on file  . Stress: Not on file   Relationships  . Social connections:    Talks on phone: Not on file    Gets together: Not on file    Attends religious service: Not on file    Active member of club or organization: Not on file    Attends meetings of clubs or organizations: Not on file    Relationship status: Not on file  . Intimate partner violence:    Fear of current or ex partner: Not on file    Emotionally abused: Not on file    Physically abused: Not on file    Forced sexual activity: Not on file  Other Topics Concern  . Not on file  Social History Narrative  . Not on file    Family History  Problem Relation Age of Onset  . Hypertension Other   . Lung cancer Brother      Current Outpatient Medications:  .  alendronate (FOSAMAX) 70 MG tablet, Take 1 tablet (70 mg total) by mouth once a week. Take with a full glass of water on an empty stomach., Disp: 4 tablet, Rfl: 6 .  aspirin EC 81 MG tablet, Take 81 mg by mouth daily. , Disp: , Rfl:  .  atorvastatin (LIPITOR) 40 MG tablet,  Take 1 tablet (40 mg total) by mouth daily at 6 PM., Disp: 30 tablet, Rfl: 0 .  clopidogrel (PLAVIX) 75 MG tablet, Take 75 mg by mouth daily., Disp: , Rfl: 11 .  fentaNYL (DURAGESIC - DOSED MCG/HR) 25 MCG/HR patch, Place 1 patch (25 mcg total) onto the skin every 3 (three) days., Disp: 10 patch, Rfl: 0 .  furosemide (LASIX) 20 MG tablet, TAKE 1 TABLET BY MOUTH ONCE DAILY, Disp: 7 tablet, Rfl: 0 .  gabapentin (NEURONTIN) 100 MG capsule, Take 1 capsule (100 mg total) by mouth at bedtime., Disp: 30 capsule, Rfl: 0 .  metoprolol (LOPRESSOR) 50 MG tablet, Take 50 mg by mouth 2 (two) times daily. , Disp: , Rfl:  .  nitroGLYCERIN (NITROSTAT) 0.4 MG SL tablet, Place 0.4 mg under the tongue every 5 (five) minutes x 3 doses as needed for chest pain., Disp: , Rfl:  .  ondansetron (ZOFRAN) 4 MG tablet, Take 1 tablet (4 mg total) by mouth every 8 (eight) hours as needed for nausea or vomiting., Disp: 30 tablet, Rfl: 3 .  oxyCODONE (OXY  IR/ROXICODONE) 5 MG immediate release tablet, Take 3 tablets (15 mg total) by mouth every 6 (six) hours as needed for severe pain., Disp: 360 tablet, Rfl: 0 .  pantoprazole (PROTONIX) 40 MG tablet, Take 1 tablet (40 mg total) by mouth 2 (two) times daily., Disp: 60 tablet, Rfl: 0 .  potassium chloride SA (K-DUR,KLOR-CON) 20 MEQ tablet, Take 20 mEq by mouth daily. , Disp: , Rfl:  .  predniSONE (DELTASONE) 5 MG tablet, TAKE 1 TABLET (5 MG TOTAL) BY MOUTH 2 TIMES DAILY WITH A MEAL., Disp: 60 tablet, Rfl: 5 .  sennosides-docusate sodium (SENOKOT-S) 8.6-50 MG tablet, Take 2 tablets by mouth 2 (two) times daily. , Disp: , Rfl:  .  torsemide (DEMADEX) 20 MG tablet, TAKE 1 TABLET BY MOUTH ONCE DAILY IN THE MORNING AS DIRECTED, Disp: , Rfl: 5 .  ZYTIGA 250 MG tablet, TAKE 4 TABLETS (1,000 MG TOTAL) BY MOUTH DAILY. TAKE ON AN EMPTY STOMACH 1 HOUR BEFORE OR 2 HOURS AFTER A MEAL, Disp: 120 tablet, Rfl: 2 .  ondansetron (ZOFRAN ODT) 4 MG disintegrating tablet, Take 1 tablet (4 mg total) by mouth every 8 (eight) hours as needed for nausea or vomiting., Disp: 40 tablet, Rfl: 2 .  tamsulosin (FLOMAX) 0.4 MG CAPS capsule, Take 1 capsule (0.4 mg total) by mouth daily., Disp: 30 capsule, Rfl: 11 No current facility-administered medications for this visit.   Facility-Administered Medications Ordered in Other Visits:  .  0.9 %  sodium chloride infusion, , Intravenous, Continuous, Sindy Guadeloupe, MD, Stopped at 04/28/18 1452 .  Leuprolide Acetate (6 Month) (LUPRON) injection 45 mg, 45 mg, Intramuscular, Q6 months, Sindy Guadeloupe, MD  Physical exam:  Vitals:   10/06/18 0918 10/06/18 0922  BP: 135/80   Pulse: 77   Resp: 16   Temp: 98.1 F (36.7 C)   TempSrc: Oral   Weight:  188 lb (85.3 kg)  Height:  5\' 8"  (1.727 m)   Physical Exam  Constitutional: He is oriented to person, place, and time.  Appears fatigued.  In no acute distress  HENT:  Head: Normocephalic and atraumatic.  Eyes: Pupils are equal, round,  and reactive to light. EOM are normal.  Neck: Normal range of motion.  Cardiovascular: Normal rate, regular rhythm and normal heart sounds.  Pulmonary/Chest: Effort normal and breath sounds normal.  Abdominal: Soft. Bowel sounds are normal.  Musculoskeletal: He exhibits  edema.  Neurological: He is alert and oriented to person, place, and time.  Skin: Skin is warm and dry.     CMP Latest Ref Rng & Units 10/06/2018  Glucose 70 - 99 mg/dL 146(H)  BUN 8 - 23 mg/dL 23  Creatinine 0.61 - 1.24 mg/dL 1.41(H)  Sodium 135 - 145 mmol/L 142  Potassium 3.5 - 5.1 mmol/L 3.9  Chloride 98 - 111 mmol/L 100  CO2 22 - 32 mmol/L 31  Calcium 8.9 - 10.3 mg/dL 9.3  Total Protein 6.5 - 8.1 g/dL 7.0  Total Bilirubin 0.3 - 1.2 mg/dL 0.4  Alkaline Phos 38 - 126 U/L 76  AST 15 - 41 U/L 15  ALT 0 - 44 U/L 11   CBC Latest Ref Rng & Units 10/06/2018  WBC 4.0 - 10.5 K/uL 5.5  Hemoglobin 13.0 - 17.0 g/dL 9.8(L)  Hematocrit 39.0 - 52.0 % 32.0(L)  Platelets 150 - 400 K/uL 354    No images are attached to the encounter.  Dg Bone Density  Result Date: 09/06/2018 EXAM: DUAL X-RAY ABSORPTIOMETRY (DXA) FOR BONE MINERAL DENSITY IMPRESSION: jbh Your patient Gearl Baratta completed a BMD test on 09/06/2018 using the [place] (analysis version: 14.10) manufactured by [employer]. The following summarizes the results of our evaluation. PATIENT BIOGRAPHICAL: Name: Kaleab, Frasier Patient ID: [id number] Birth Date: 1934/11/19 Height:     67.0 in. Gender: Male Exam Date: 09/06/2018 Weight:     191.9 lbs. Indications: Advanced Age, Prostate Cancer, Caucasian, Long term prednisone use Fractures: Treatments: Calcium, prednisone, Vitamin D, zytiga ASSESSMENT: The BMD measured at Femur Neck Left is 0.611 g/cm2 with a T-score of -3.5. The patient is considered OSTEOPOROTIC according to [facility]) criteria. Lumbar spine was not utilized due to advanced degenerative changes. The quality of the scan is  good. Site Region Measured Measured WHO Young Adult BMD Date       Age      Classification T-score DualFemur Neck Left 09/06/2018 81.3 Osteoporosis -3.5 0.611 g/cm2 DualFemur Neck Left 04/20/2017 80.0 Osteoporosis -3.2 0.660 g/cm2 DualFemur Total Mean 09/06/2018 81.3 Osteopenia -2.3 0.774 g/cm2 DualFemur Total Mean 04/20/2017 80.0 Osteopenia -2.1 0.795 g/cm2 Left Forearm Radius 33% 09/06/2018 81.3 Normal -0.9 0.902 g/cm2 Left Forearm Radius 33% 04/20/2017 80.0 Normal -1.0 0.892 g/cm2 World Health Organization [facility]) criteria for post-menopausal, Caucasian Women: Normal:       T-score at or above -1 SD Osteopenia:   T-score between -1 and -2.5 SD Osteoporosis: T-score at or below -2.5 SD RECOMMENDATIONS: 1. All patients should optimize calcium and vitamin D intake. 2. Consider FDA-approved medical therapies in postmenopausal women and men aged 48 years and older, based on the following: a. A hip or vertebral(clinical or morphometric) fracture b. T-score < -2.5 at the femoral neck or spine after appropriate evaluation to exclude secondary causes c. Low bone mass (T-score between -1.0 and -2.5 at the femoral neck or spine) and a 10-year probability of a hip fracture > 3% or a 10-year probability of a major osteoporosis-related fracture > 20% based on the [place]-adapted WHO algorithm d. Clinician judgment and/or patient preferences may indicate treatment for people with 10-year fracture probabilities above or below these levels FOLLOW-UP: People with diagnosed cases of osteoporosis or osteopenia should be regularly tested for bone mineral density. For patients eligible for Medicare, routine testing is allowed once every 2 years. The testing frequency can be increased to one year for patients who have rapidly progressing disease, or for those who are  receiving medical therapy to restore bone mass. I have reviewed this report, and agree with the above findings. Mark A. Thornton Papas, M.D. [facility] Radiology Electronically Signed    By: Lavonia Dana M.D.   On: 09/06/2018 16:55     Assessment and plan- Patient is a 83 y.o. male with following issues:  1.  Castrate sensitive prostate cancer.  He will get his next Lupron shot in January 2019.  He will continue Zytiga at this time.  His PSA has continued to remain at 0.01.  PSA from today is pending.  I will get repeat CT chest abdomen and pelvis without contrast and bone scan in 2 months time.  2.  Nausea: Etiology is unclear patient does not wish to get any scans at this time and he would like to wait for the CT in 2 months.  For now I will give him PRN Zofran for his nausea but he will call us if his symptoms get worse.  3.  Leg swelling: He is currently on Lasix and follows up with nephrology and urology  4.  Patient has moderate anemia likely secondary to his chronic kidney disease.  Continue to monitor  I will see him back in 2 months with CBC CMP and PSA and scans prior   Visit Diagnosis 1. Prostate cancer metastatic to bone ([place])   2. High risk medication use      Dr. Randa Evens, MD, MPH [facility] at [facility] [phone] 10/06/2018 1:07 PM

## 2018-10-13 ENCOUNTER — Other Ambulatory Visit: Payer: Self-pay | Admitting: Oncology

## 2018-10-13 DIAGNOSIS — C61 Malignant neoplasm of prostate: Secondary | ICD-10-CM

## 2018-10-13 DIAGNOSIS — C7951 Secondary malignant neoplasm of bone: Principal | ICD-10-CM

## 2018-10-20 ENCOUNTER — Other Ambulatory Visit: Payer: Self-pay

## 2018-10-20 ENCOUNTER — Telehealth: Payer: Self-pay

## 2018-10-20 MED ORDER — FENTANYL 25 MCG/HR TD PT72: 25.0000 ug | MEDICATED_PATCH | TRANSDERMAL | 0 refills | Status: DC

## 2018-10-20 MED ORDER — OXYCODONE HCL 5 MG PO TABS: 15.0000 mg | ORAL_TABLET | Freq: Four times a day (QID) | ORAL | 0 refills | Status: DC | PRN

## 2018-10-20 MED FILL — predniSONE 5 MG TABS: 5 | 30 days supply | Qty: 60 | Fill #0

## 2018-10-20 MED FILL — ZYTIGA 250 MG TABLET: 250 | 30 days supply | Qty: 120 | Fill #0

## 2018-10-20 NOTE — Telephone Encounter (Signed)
Spoke with Mr Johnathan Gould to inform him that Mr Johnathan Gould his father medication has been refilled  Medication - Oxycodone 5 mg and Fentanyl 25 mcg Q 3 day a 30 days supply. The patient was understanding.

## 2018-10-31 IMAGING — US IR NEPHROSTOMY PLACEMENT LEFT
1 series · 2 of 2 positions shown · non-contrast
Comparison: None.

INDICATION: Bladder mass causing bilateral distal ureteral obstruction with
high-grade bilateral hydronephrosis and acute renal failure.

EXAM:
IR NEPHROSTOMY PLACEMENT LEFT; IR NEPHROSTOMY PLACEMENT RIGHT

[Series 1: ir nephrostomy placement left · 2 of 2 slices shown]
[im 1/2]
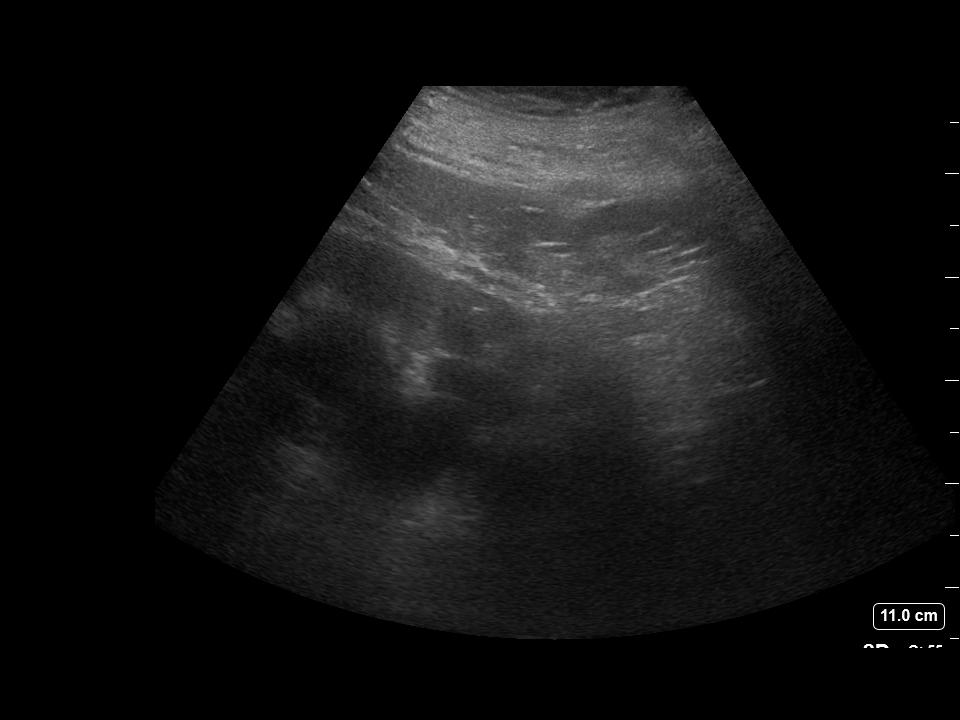
[im 2/2]
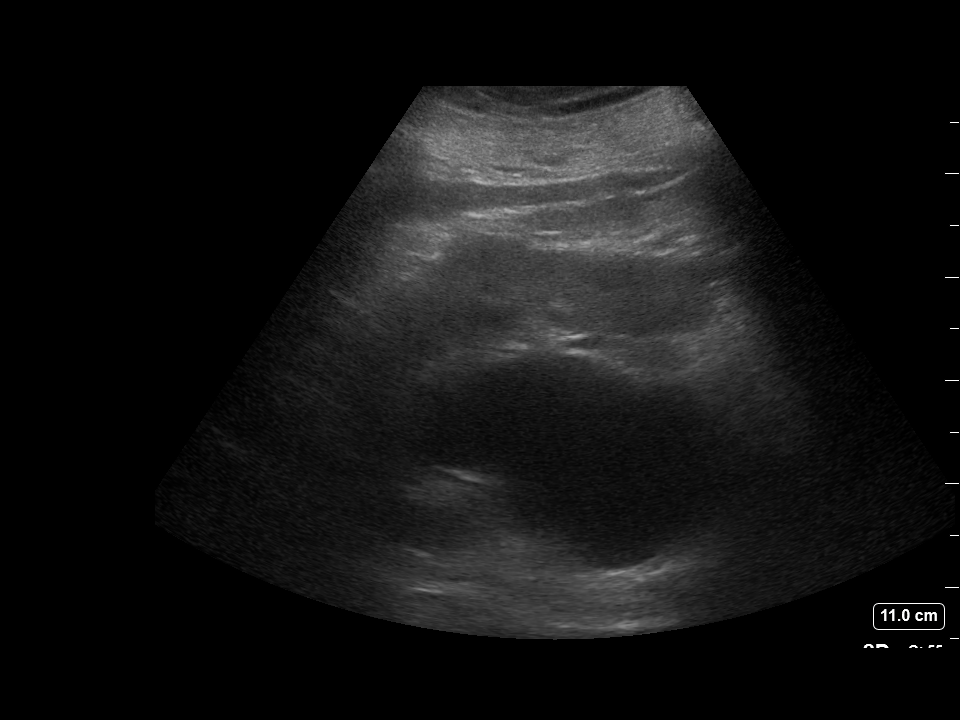

[2 of 2 positions shown; findings below may reference images not displayed]

MEDICATIONS:
2 g IV Ancef; The antibiotic was administered in an appropriate time
frame prior to skin puncture.

ANESTHESIA/SEDATION:
Fentanyl 25 mcg IV; Versed 0.5 mg IV

Moderate Sedation Time:  30 minutes.

The patient was continuously monitored during the procedure by the
interventional radiology nurse under my direct supervision.

CONTRAST:  15mL TITTPX-8MM IOPAMIDOL (TITTPX-8MM) INJECTION 61% -
administered into the collecting system(s)

FLUOROSCOPY TIME:  Fluoroscopy Time: 1 minute.

COMPLICATIONS:
None immediate.

PROCEDURE:
Informed written consent was obtained from the patient's wife after
a thorough discussion of the procedural risks, benefits and
alternatives. All questions were addressed. Maximal Sterile Barrier
Technique was utilized including caps, mask, sterile gowns, sterile
gloves, sterile drape, hand hygiene and skin antiseptic. A timeout
was performed prior to the initiation of the procedure.

In a prone position, both kidneys were localized by ultrasound.
Overlying skin was prepped with chlorhexidine.

Access of both kidneys was performed under direct ultrasound
guidance with 21 gauge needles and Accustick systems. Ultrasound
image documentation was performed. After confirming positioning in
the collecting systems with return of urine and contrast injection,
guidewires were advanced through the needles. Transitional dilators
were placed. Percutaneous tracts were dilated and bilateral 10
French percutaneous nephrostomy tubes advanced over guidewires.
Bilateral tubes were formed with positioning confirmed by
fluoroscopic spot images obtained after injection of contrast. Both
catheters were secured at the skin with Prolene retention sutures
and StatLock devices. Both catheters were connected to gravity
drainage bags.
FINDINGS: Ultrasound confirms severe bilateral hydronephrosis. After placement
of bilateral nephrostomy tubes within the renal pelvis bilaterally,
there is excellent return of urine from each tube.
IMPRESSION: Bilateral percutaneous nephrostomy tube placement with bilateral 10
French catheters placed and formed in the renal pelvis. Both
catheters were connected to gravity drainage bags.

## 2018-11-06 ENCOUNTER — Inpatient Hospital Stay: Payer: Medicare Other | Attending: Oncology

## 2018-11-06 ENCOUNTER — Telehealth: Payer: Self-pay | Admitting: Pharmacy Technician

## 2018-11-06 DIAGNOSIS — C7951 Secondary malignant neoplasm of bone: Secondary | ICD-10-CM | POA: Diagnosis present

## 2018-11-06 DIAGNOSIS — D631 Anemia in chronic kidney disease: Secondary | ICD-10-CM

## 2018-11-06 DIAGNOSIS — Z5111 Encounter for antineoplastic chemotherapy: Secondary | ICD-10-CM | POA: Diagnosis present

## 2018-11-06 DIAGNOSIS — N189 Chronic kidney disease, unspecified: Secondary | ICD-10-CM

## 2018-11-06 DIAGNOSIS — C61 Malignant neoplasm of prostate: Secondary | ICD-10-CM | POA: Diagnosis present

## 2018-11-06 MED ORDER — LEUPROLIDE ACETATE (3 MONTH) 22.5 MG IM KIT
22.5000 mg | PACK | Freq: Once | INTRAMUSCULAR | Status: AC
  Administered 2018-11-06: 22.5 mg via INTRAMUSCULAR

## 2018-11-06 NOTE — Telephone Encounter (Signed)
Oral Oncology Patient Advocate Encounter   Was successful in securing patient an $37 grant from [facility] [place] [facility]) to provide copayment coverage for Zytiga.  This will keep the out of pocket expense at $0.     I have spoken with the patient.    The billing information is as follows and has been shared with [place].   Member ID: [id number] Group ID: [id number] RxBin: [id number] Dates of Eligibility: 11/02/2018 through 11/02/2019  [place] Patient [place] Phone [phone] Fax [phone] 11/06/2018 1:08 PM

## 2018-11-09 ENCOUNTER — Telehealth: Payer: Self-pay | Admitting: Pharmacy Technician

## 2018-11-09 NOTE — Telephone Encounter (Signed)
Oral Oncology Patient Advocate Encounter   Was successful in securing patient a $4250 grant from [place] to provide copayment coverage for Zytiga.  This will keep the out of pocket expense at $0.    The billing information is as follows and has been shared with [place].   Member ID: [id number] Group ID: [id number] RxBin: [id number] PCN: [id number] Dates of Eligibility: 11/02/2018 through 11/03/2019  [place] Patient [place] Phone [phone] Fax [phone] 11/09/2018 11:14 AM

## 2018-11-17 MED FILL — ZYTIGA 250 MG TABLET: 250 | 30 days supply | Qty: 120 | Fill #1

## 2018-11-17 MED FILL — predniSONE 5 MG TABS: 5 | 30 days supply | Qty: 60 | Fill #1

## 2018-11-20 ENCOUNTER — Other Ambulatory Visit: Payer: Self-pay | Admitting: *Deleted

## 2018-11-20 MED ORDER — OXYCODONE HCL 5 MG PO TABS: 15.0000 mg | ORAL_TABLET | Freq: Four times a day (QID) | ORAL | 0 refills | Status: DC | PRN

## 2018-11-20 MED ORDER — FENTANYL 25 MCG/HR TD PT72: 1.0000 | MEDICATED_PATCH | TRANSDERMAL | 0 refills | Status: DC

## 2018-11-25 IMAGING — CT CT CHEST W/O CM
1 series · 15 of 34 positions shown, 19 images · non-contrast
Comparison: Abdomen and pelvis CT 10/16/2016

CLINICAL DATA: New diagnosis of presumed metastatic prostate
cancer. Staging workup.

EXAM:
CT CHEST WITHOUT CONTRAST
TECHNIQUE: Multidetector CT imaging of the chest was performed following the
standard protocol without IV contrast.

[Series 2: thorax · axial · 0.70mm/px · z∈[-644,-370]mm · 15 of 161 slices shown, 19 images]
[im 12/161  mediastinal]
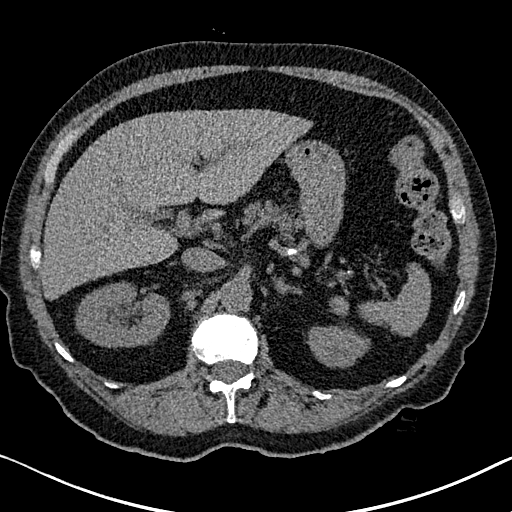
[im 12/161  lung]
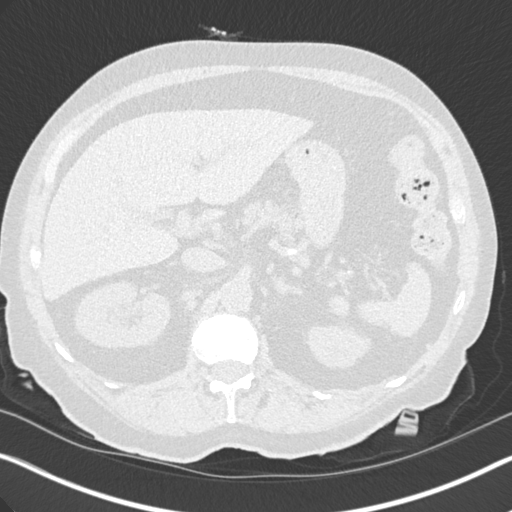
[im 24/161  lung]
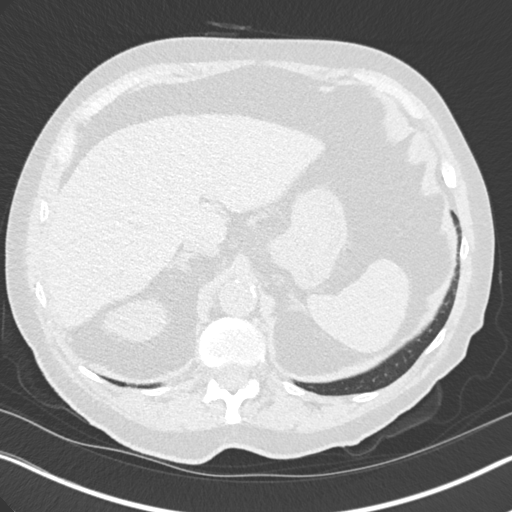
[im 33/161  lung]
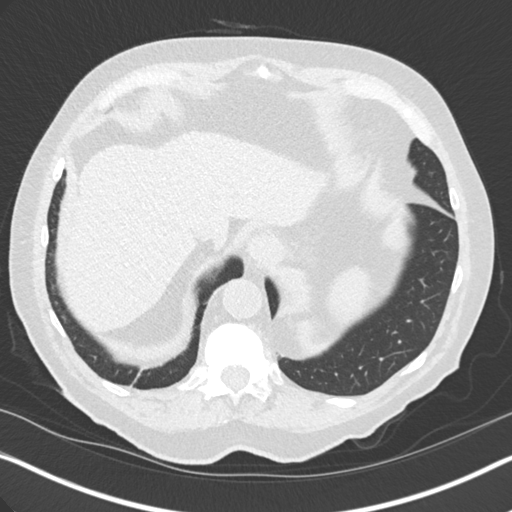
[im 42/161  lung]
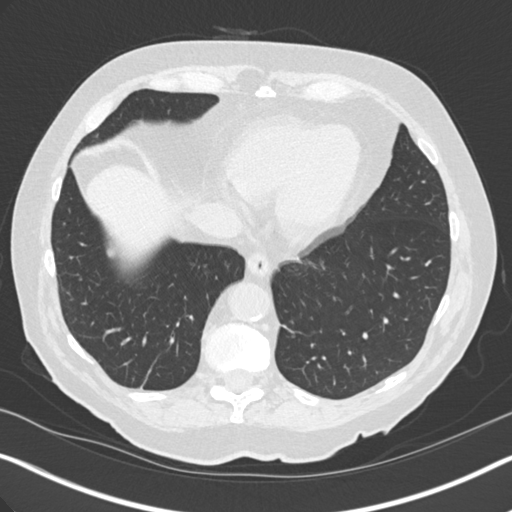
[im 54/161  mediastinal]
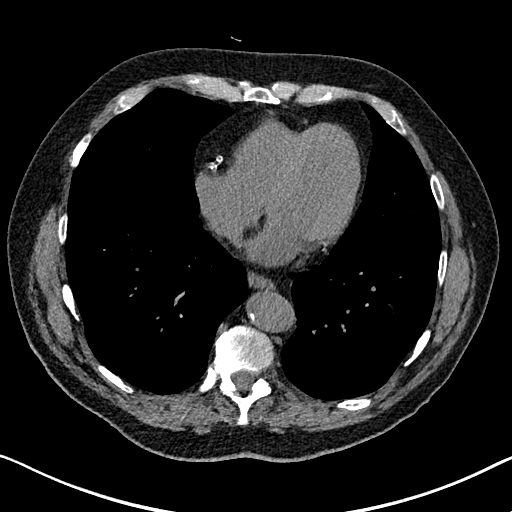
[im 54/161  lung]
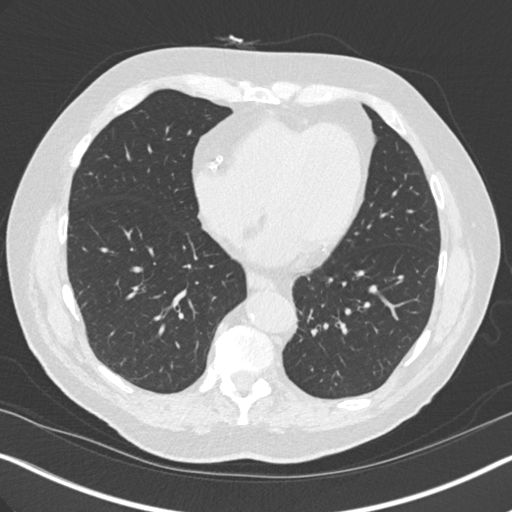
[im 65/161  lung]
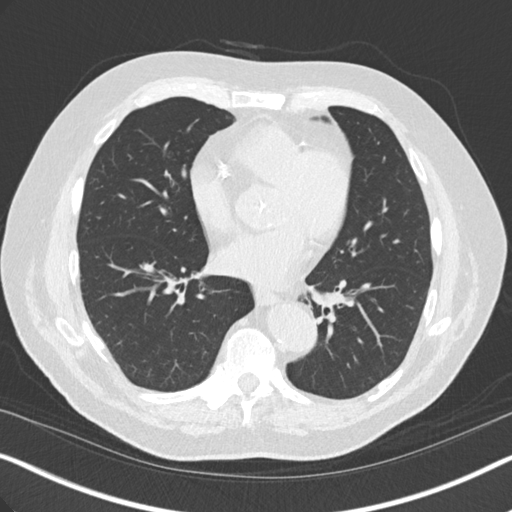
[im 72/161  lung]
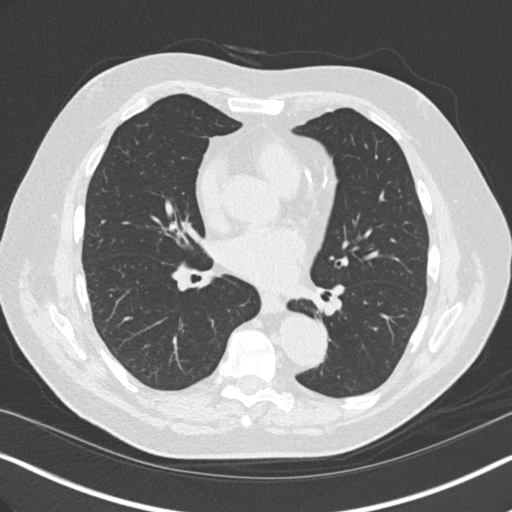
[im 83/161  lung]
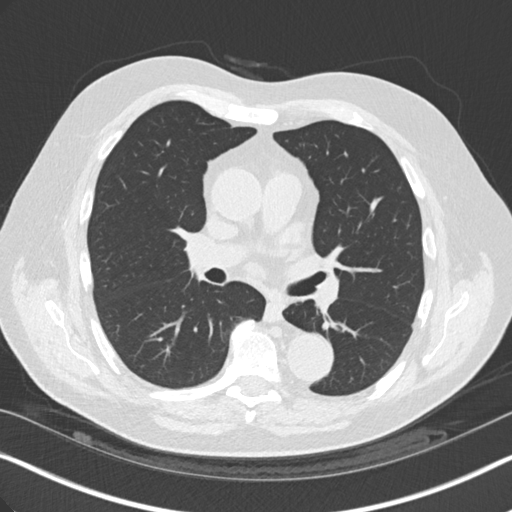
[im 89/161  mediastinal]
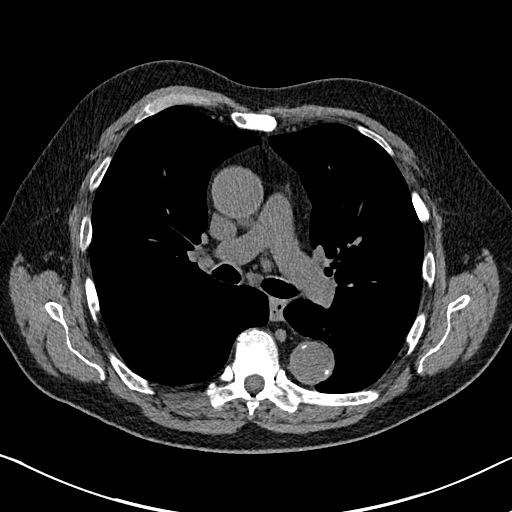
[im 89/161  lung]
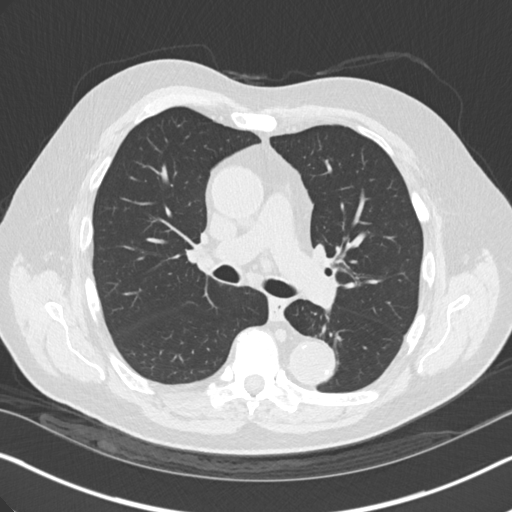
[im 97/161  lung]
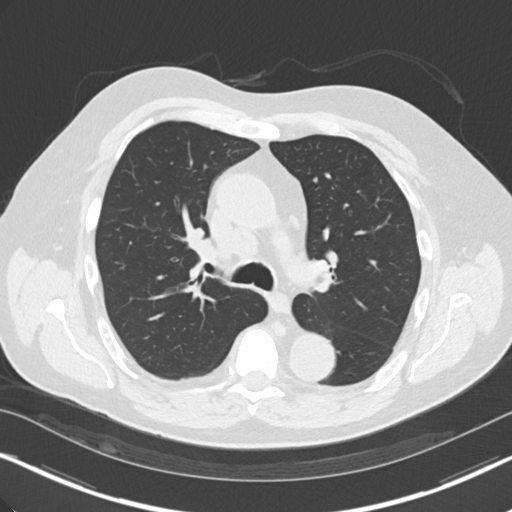
[im 107/161  lung]
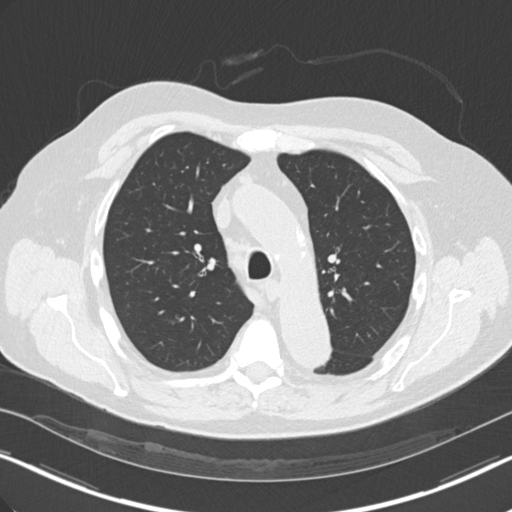
[im 119/161  lung]
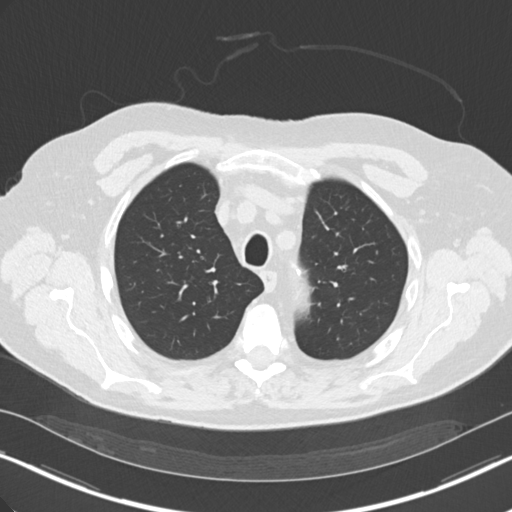
[im 129/161  mediastinal]
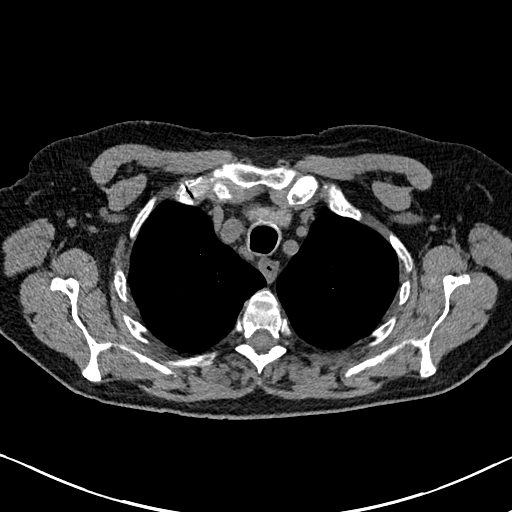
[im 129/161  lung]
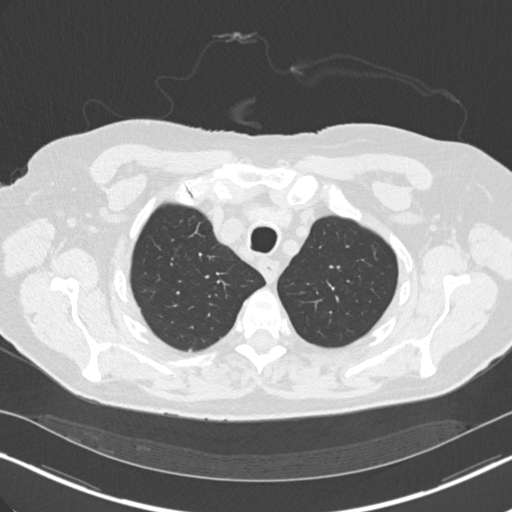
[im 137/161  lung]
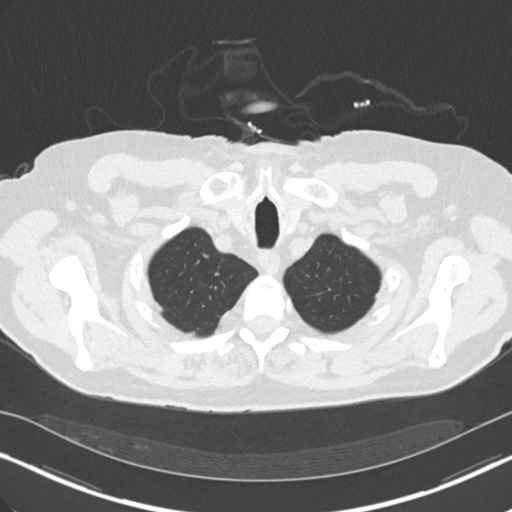
[im 149/161  lung]
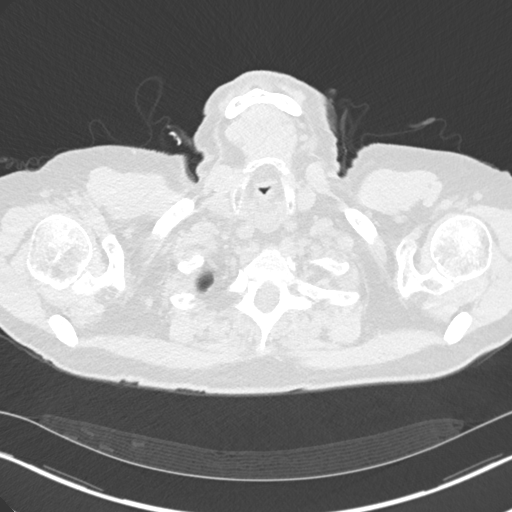

[15 of 34 positions shown; findings below may reference images not displayed]

FINDINGS: Cardiovascular: The heart size is normal. No pericardial effusion.
Coronary artery calcification is noted. Atherosclerotic
calcification is noted in the wall of the thoracic aorta.

Mediastinum/Nodes: 15 mm short axis right paratracheal lymph node
identified. 10 mm short axis subcarinal lymph node is evident. No
evidence for gross hilar lymphadenopathy although assessment is
limited by the lack of intravenous contrast on today's study. The
esophagus has normal imaging features. There is no axillary
lymphadenopathy.

Lungs/Pleura: Biapical pleural-parenchymal scarring is noted.
Centrilobular emphysema noted bilaterally. Bronchial wall thickening
is evident. Calcified granuloma identified right apex. 4 mm
perifissural nodule seen on the minor fissure image 85 series 3,
likely benign. Subsegmental atelectasis is present within the
posterior right lower lobe. No focal airspace consolidation. No
pulmonary edema or pleural effusion.

Upper Abdomen: Visualized portion of the liver are suggests a subtle
nodularity of the contour. Bilateral percutaneous nephrostomy tubes
are evident. 12 mm hyperattenuating lesion is identified in the
upper pole of the left kidney, similar to previous atherosclerotic
calcification noted in the wall of the abdominal aorta

Musculoskeletal: Widespread sclerotic metastases are identified
within the visualized bony anatomy.
IMPRESSION: 1. Enlarged right paratracheal lymph node may reflect metastatic
disease.
2. Emphysema without definite pulmonary parenchymal metastatic
involvement.
3. Widespread sclerotic bone lesions compatible with metastatic
involvement.
4. Liver demonstrates subtly nodular contour raising the question
of, but not diagnostic for cirrhosis.

## 2018-12-06 ENCOUNTER — Other Ambulatory Visit: Payer: Self-pay

## 2018-12-06 ENCOUNTER — Encounter
Admission: RE | Admit: 2018-12-06 | Discharge: 2018-12-06 | Disposition: A | Discharge status: Home / Self Care | Payer: Medicare Other | Source: Ambulatory Visit | Attending: Oncology | Admitting: Oncology

## 2018-12-06 ENCOUNTER — Inpatient Hospital Stay: Payer: Medicare Other | Attending: Oncology

## 2018-12-06 ENCOUNTER — Ambulatory Visit
Admission: RE | Admit: 2018-12-06 | Discharge: 2018-12-06 | Disposition: A | Discharge status: Home / Self Care | Payer: Medicare Other | Source: Ambulatory Visit | Attending: Oncology | Admitting: Oncology

## 2018-12-06 DIAGNOSIS — N189 Chronic kidney disease, unspecified: Secondary | ICD-10-CM | POA: Insufficient documentation

## 2018-12-06 DIAGNOSIS — Z87891 Personal history of nicotine dependence: Secondary | ICD-10-CM | POA: Insufficient documentation

## 2018-12-06 DIAGNOSIS — C7951 Secondary malignant neoplasm of bone: Principal | ICD-10-CM

## 2018-12-06 DIAGNOSIS — D631 Anemia in chronic kidney disease: Secondary | ICD-10-CM | POA: Diagnosis not present

## 2018-12-06 DIAGNOSIS — G893 Neoplasm related pain (acute) (chronic): Secondary | ICD-10-CM | POA: Insufficient documentation

## 2018-12-06 DIAGNOSIS — C61 Malignant neoplasm of prostate: Secondary | ICD-10-CM | POA: Diagnosis not present

## 2018-12-06 DIAGNOSIS — Z79899 Other long term (current) drug therapy: Secondary | ICD-10-CM | POA: Diagnosis not present

## 2018-12-06 LAB — CBC WITH DIFFERENTIAL/PLATELET
Abs Immature Granulocytes: 0.03 10*3/uL (ref 0.00–0.07)
BASOS ABS: 0 10*3/uL (ref 0.0–0.1)
BASOS PCT: 0 %
EOS ABS: 0.1 10*3/uL (ref 0.0–0.5)
EOS PCT: 2 %
HCT: 34.5 % — ABNORMAL LOW (ref 39.0–52.0)
Hemoglobin: 10.4 g/dL — ABNORMAL LOW (ref 13.0–17.0)
IMMATURE GRANULOCYTES: 0 %
Lymphocytes Relative: 8 %
Lymphs Abs: 0.7 10*3/uL (ref 0.7–4.0)
MCH: 27.4 pg (ref 26.0–34.0)
MCHC: 30.1 g/dL (ref 30.0–36.0)
MCV: 91 fL (ref 80.0–100.0)
Monocytes Absolute: 0.6 10*3/uL (ref 0.1–1.0)
Monocytes Relative: 7 %
NEUTROS PCT: 83 %
NRBC: 0 % (ref 0.0–0.2)
Neutro Abs: 7.1 10*3/uL (ref 1.7–7.7)
PLATELETS: 331 10*3/uL (ref 150–400)
RBC: 3.79 MIL/uL — AB (ref 4.22–5.81)
RDW: 15.2 % (ref 11.5–15.5)
WBC: 8.5 10*3/uL (ref 4.0–10.5)

## 2018-12-06 LAB — COMPREHENSIVE METABOLIC PANEL
ALT: 13 U/L (ref 0–44)
ANION GAP: 5 (ref 5–15)
AST: 14 U/L — ABNORMAL LOW (ref 15–41)
Albumin: 3.5 g/dL (ref 3.5–5.0)
Alkaline Phosphatase: 88 U/L (ref 38–126)
BUN: 21 mg/dL (ref 8–23)
CHLORIDE: 104 mmol/L (ref 98–111)
CO2: 29 mmol/L (ref 22–32)
CREATININE: 1.27 mg/dL — AB (ref 0.61–1.24)
Calcium: 8.8 mg/dL — ABNORMAL LOW (ref 8.9–10.3)
GFR calc non Af Amer: 53 mL/min — ABNORMAL LOW (ref >60)
Glucose, Bld: 133 mg/dL — ABNORMAL HIGH (ref 70–99)
Potassium: 4.3 mmol/L (ref 3.5–5.1)
SODIUM: 138 mmol/L (ref 135–145)
Total Bilirubin: 0.2 mg/dL — ABNORMAL LOW (ref 0.3–1.2)
Total Protein: 7 g/dL (ref 6.5–8.1)

## 2018-12-06 LAB — PSA

## 2018-12-06 MED ORDER — TECHNETIUM TC 99M MEDRONATE IV KIT
21.7900 | PACK | Freq: Once | INTRAVENOUS | Status: AC | PRN
  Administered 2018-12-06: 21.79 via INTRAVENOUS

## 2018-12-08 ENCOUNTER — Other Ambulatory Visit: Payer: Self-pay | Admitting: *Deleted

## 2018-12-08 ENCOUNTER — Other Ambulatory Visit: Payer: Self-pay

## 2018-12-08 ENCOUNTER — Inpatient Hospital Stay (HOSPITAL_BASED_OUTPATIENT_CLINIC_OR_DEPARTMENT_OTHER): Payer: Medicare Other | Admitting: Oncology

## 2018-12-08 VITALS — BP 138/86 | HR 80 | Temp 98.4°F | Resp 18 | Wt 187.0 lb

## 2018-12-08 DIAGNOSIS — C7951 Secondary malignant neoplasm of bone: Secondary | ICD-10-CM

## 2018-12-08 DIAGNOSIS — C61 Malignant neoplasm of prostate: Secondary | ICD-10-CM | POA: Diagnosis not present

## 2018-12-08 DIAGNOSIS — D631 Anemia in chronic kidney disease: Secondary | ICD-10-CM | POA: Diagnosis not present

## 2018-12-08 DIAGNOSIS — N189 Chronic kidney disease, unspecified: Secondary | ICD-10-CM

## 2018-12-08 DIAGNOSIS — Z87891 Personal history of nicotine dependence: Secondary | ICD-10-CM

## 2018-12-08 DIAGNOSIS — N183 Chronic kidney disease, stage 3 (moderate): Secondary | ICD-10-CM

## 2018-12-08 DIAGNOSIS — Z862 Personal history of diseases of the blood and blood-forming organs and certain disorders involving the immune mechanism: Secondary | ICD-10-CM

## 2018-12-08 DIAGNOSIS — G893 Neoplasm related pain (acute) (chronic): Secondary | ICD-10-CM

## 2018-12-08 DIAGNOSIS — R5383 Other fatigue: Secondary | ICD-10-CM

## 2018-12-08 DIAGNOSIS — Z79899 Other long term (current) drug therapy: Secondary | ICD-10-CM

## 2018-12-08 DIAGNOSIS — R0609 Other forms of dyspnea: Secondary | ICD-10-CM

## 2018-12-08 MED ORDER — OXYCODONE HCL 10 MG PO TABS: 20.0000 mg | ORAL_TABLET | Freq: Four times a day (QID) | ORAL | 0 refills | Status: DC | PRN

## 2018-12-08 NOTE — Progress Notes (Signed)
Here for follow up. Per pt  C/o back and hip pain and " all over body pain " per pt- today pain level is 5.

## 2018-12-11 ENCOUNTER — Encounter: Payer: Self-pay | Admitting: Oncology

## 2018-12-11 NOTE — Progress Notes (Signed)
Hematology/Oncology Consult note [facility]  [phone] [phone]  Patient Care Team: [name], MD as PCP - General (Internal Medicine)   Name of the patient: Johnathan Gould  [id number]  Mar 08, 1935   Date of visit: 12/11/18  Diagnosis- 1. 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 Anemia of chronic kidney disease    Chief complaint/ Reason for visit-routine follow-up of prostate cancer  Heme/Onc history: patient is a83 year old male was recently admitted to the hospital on 10/16/2016 with symptoms of left hip pain and renal failure. He was found to have bilateral hydronephrosis and is status post bilateral nephrostomy tube placement. CT abdomen showed an irregular polypoid mass at the posterior aspect of the bladder highly concerning for bladder carcinoma. Extensive bulky adenopathy throughout the abdomen and pelvis consistent with nodal metastases. Widespread osseous metastatic disease. Bone scan showed multifocal areas of increased activity throughout the pelvis, bilateral femurs, rib cage, right scapula, thoracic cervical and lumbar spine consistent with metastatic disease. Patient was found to have enlarged abnormal prostate as well as an elevated PSA of 469 and was presumptively diagnosed with metastatic prostate cancer. He was started on Firmagon by urology. He was also seen by radiation oncology and started palliative radiation to his left hip on 11/04/2016 and finished 10 doses of palliative Rt.  He is currently on lupron through usevery 3 months  4 core biopsy obtained for tissue diagnosis which showed: prostate adenocarcinoma gleasons 9 and 10 in all 4 cores  zytiga started in March 2018. He is on fosamax for his osteoporosiswhich was stopped due to heartburn  Patient was admitted to the hospital on 02/05/18 with worsening epigastric pain. He was subsequently found to have acute MITwo-vessel coronary artery  disease, with 80% stenosis proximal LAD, occluded mid left circumflex, subtotal occlusion OM1, and patent stent mid RCAand underwent DES placement   Interval history-reports feeling fatigued and needs to rest frequently.  He has chronic low back pain for which he is on fentanyl patch and as needed oxycodone but does not think that his oxycodone has been helping him much of late.  ECOG PS- 2 Pain scale- 3 Opioid associated constipation- no  Review of systems- Review of Systems  Constitutional: Positive for malaise/fatigue. Negative for chills, fever and weight loss.  HENT: Negative for congestion, ear discharge and nosebleeds.   Eyes: Negative for blurred vision.  Respiratory: Positive for shortness of breath. Negative for cough, hemoptysis, sputum production and wheezing.   Cardiovascular: Negative for chest pain, palpitations, orthopnea and claudication.  Gastrointestinal: Negative for abdominal pain, blood in stool, constipation, diarrhea, heartburn, melena, nausea and vomiting.  Genitourinary: Negative for dysuria, flank pain, frequency, hematuria and urgency.  Musculoskeletal: Positive for back pain and joint pain. Negative for myalgias.  Skin: Negative for rash.  Neurological: Negative for dizziness, tingling, focal weakness, seizures, weakness and headaches.  Endo/Heme/Allergies: Does not bruise/bleed easily.  Psychiatric/Behavioral: Negative for depression and suicidal ideas. The patient does not have insomnia.       No Known Allergies   Past Medical History:  Diagnosis Date  . Arthritis   . CAD (coronary artery disease)   . Hypertension   . Prostate cancer ([place])    metastatic   . RAD (reactive airway disease)      Past Surgical History:  Procedure Laterality Date  . CORONARY ANGIOPLASTY WITH STENT PLACEMENT    . CORONARY STENT INTERVENTION N/A 02/07/2018   Procedure: CORONARY STENT INTERVENTION;  Surgeon: Isaias Cowman, MD;  Location: [facility];   Service: Cardiovascular;  Laterality: N/A;  . IR GENERIC HISTORICAL  10/17/2016   IR NEPHROSTOMY PLACEMENT RIGHT 10/17/2016 [facility]  . IR GENERIC HISTORICAL  10/17/2016   IR NEPHROSTOMY PLACEMENT LEFT 10/17/2016 [name], MD [facility]  . IR GENERIC HISTORICAL  12/23/2016   IR NEPHROSTOMY EXCHANGE LEFT 12/23/2016 [facility]  . IR GENERIC HISTORICAL  12/23/2016   IR NEPHROSTOMY EXCHANGE RIGHT 12/23/2016 [facility]  . IR NEPHROSTOMY EXCHANGE LEFT  02/11/2017  . IR NEPHROSTOMY EXCHANGE RIGHT  02/11/2017  . KNEE SURGERY Left 2006   Laparascopy done on left knee, scar tissue taken out  . LEFT HEART CATH AND CORONARY ANGIOGRAPHY N/A 02/07/2018   Procedure: LEFT HEART CATH AND CORONARY ANGIOGRAPHY;  Surgeon: Isaias Cowman, MD;  Location: [facility];  Service: Cardiovascular;  Laterality: N/A;  . nephrostomy tubes Bilateral     Social History   Socioeconomic History  . Marital status: Married    Spouse name: Not on file  . Number of children: Not on file  . Years of education: Not on file  . Highest education level: Not on file  Occupational History  . Not on file  Social Needs  . Financial resource strain: Not on file  . Food insecurity:    Worry: Not on file    Inability: Not on file  . Transportation needs:    Medical: Not on file    Non-medical: Not on file  Tobacco Use  . Smoking status: Former Smoker    Packs/day: 1.00    Years: 69.50    Pack years: 69.50    Last attempt to quit: 11/04/2014    Years since quitting: 4.1  . Smokeless tobacco: Never Used  Substance and Sexual Activity  . Alcohol use: No  . Drug use: No  . Sexual activity: Yes  Lifestyle  . Physical activity:    Days per week: Not on file    Minutes per session: Not on file  . Stress: Not on file  Relationships  . Social connections:    Talks on phone: Not on file    Gets together: Not on file    Attends religious service: Not on file    Active member of club or  organization: Not on file    Attends meetings of clubs or organizations: Not on file    Relationship status: Not on file  . Intimate partner violence:    Fear of current or ex partner: Not on file    Emotionally abused: Not on file    Physically abused: Not on file    Forced sexual activity: Not on file  Other Topics Concern  . Not on file  Social History Narrative  . Not on file    Family History  Problem Relation Age of Onset  . Hypertension Other   . Lung cancer Brother      Current Outpatient Medications:  .  alendronate (FOSAMAX) 70 MG tablet, Take 1 tablet (70 mg total) by mouth once a week. Take with a full glass of water on an empty stomach., Disp: 4 tablet, Rfl: 6 .  aspirin EC 81 MG tablet, Take 81 mg by mouth daily. , Disp: , Rfl:  .  clopidogrel (PLAVIX) 75 MG tablet, Take 75 mg by mouth daily., Disp: , Rfl: 11 .  fentaNYL (DURAGESIC) 25 MCG/HR, Place 1 patch onto the skin every 3 (three) days., Disp: 10 patch, Rfl: 0 .  furosemide (LASIX)  20 MG tablet, TAKE 1 TABLET BY MOUTH ONCE DAILY, Disp: 7 tablet, Rfl: 0 .  gabapentin (NEURONTIN) 100 MG capsule, Take 1 capsule (100 mg total) by mouth at bedtime., Disp: 30 capsule, Rfl: 0 .  metoprolol (LOPRESSOR) 50 MG tablet, Take 50 mg by mouth 2 (two) times daily. , Disp: , Rfl:  .  omeprazole (PRILOSEC) 40 MG capsule, Take 40 mg by mouth daily., Disp: , Rfl:  .  ondansetron (ZOFRAN ODT) 4 MG disintegrating tablet, Take 1 tablet (4 mg total) by mouth every 8 (eight) hours as needed for nausea or vomiting., Disp: 40 tablet, Rfl: 2 .  ondansetron (ZOFRAN) 4 MG tablet, Take 1 tablet (4 mg total) by mouth every 8 (eight) hours as needed for nausea or vomiting., Disp: 30 tablet, Rfl: 3 .  pantoprazole (PROTONIX) 40 MG tablet, Take 1 tablet (40 mg total) by mouth 2 (two) times daily., Disp: 60 tablet, Rfl: 0 .  potassium chloride SA (K-DUR,KLOR-CON) 20 MEQ tablet, Take 20 mEq by mouth daily. , Disp: , Rfl:  .  predniSONE (DELTASONE) 5  MG tablet, TAKE 1 TABLET (5 MG TOTAL) BY MOUTH 2 TIMES DAILY WITH A MEAL., Disp: 60 tablet, Rfl: 5 .  sennosides-docusate sodium (SENOKOT-S) 8.6-50 MG tablet, Take 2 tablets by mouth 2 (two) times daily. , Disp: , Rfl:  .  tamsulosin (FLOMAX) 0.4 MG CAPS capsule, Take 1 capsule (0.4 mg total) by mouth daily., Disp: 30 capsule, Rfl: 11 .  torsemide (DEMADEX) 20 MG tablet, TAKE 1 TABLET BY MOUTH ONCE DAILY IN THE MORNING AS DIRECTED, Disp: , Rfl: 5 .  ZYTIGA 250 MG tablet, TAKE 4 TABLETS (1,000 MG TOTAL) BY MOUTH DAILY. TAKE ON AN EMPTY STOMACH 1 HOUR BEFORE OR 2 HOURS AFTER A MEAL, Disp: 120 tablet, Rfl: 2 .  atorvastatin (LIPITOR) 40 MG tablet, Take 1 tablet (40 mg total) by mouth daily at 6 PM. (Patient not taking: Reported on 12/08/2018), Disp: 30 tablet, Rfl: 0 .  nitroGLYCERIN (NITROSTAT) 0.4 MG SL tablet, Place 0.4 mg under the tongue every 5 (five) minutes x 3 doses as needed for chest pain., Disp: , Rfl:  .  Oxycodone HCl 10 MG TABS, Take 2 tablets (20 mg total) by mouth every 6 (six) hours as needed., Disp: 240 tablet, Rfl: 0 No current facility-administered medications for this visit.   Facility-Administered Medications Ordered in Other Visits:  .  0.9 %  sodium chloride infusion, , Intravenous, Continuous, Sindy Guadeloupe, MD, Stopped at 04/28/18 1452 .  Leuprolide Acetate (6 Month) (LUPRON) injection 45 mg, 45 mg, Intramuscular, Q6 months, Sindy Guadeloupe, MD  Physical exam:  Vitals:   12/08/18 1129  BP: 138/86  Pulse: 80  Resp: 18  Temp: 98.4 F (36.9 C)  TempSrc: Tympanic  Weight: 187 lb (84.8 kg)   Physical Exam Constitutional:      General: He is not in acute distress.    Comments: Appears fatigued  HENT:     Head: Normocephalic and atraumatic.  Eyes:     Pupils: Pupils are equal, round, and reactive to light.  Neck:     Musculoskeletal: Normal range of motion.  Cardiovascular:     Rate and Rhythm: Normal rate and regular rhythm.     Heart sounds: Normal heart sounds.    Pulmonary:     Effort: Pulmonary effort is normal.     Breath sounds: Normal breath sounds.  Abdominal:     General: Bowel sounds are normal.  Palpations: Abdomen is soft.  Musculoskeletal:     Right lower leg: Edema present.     Left lower leg: Edema present.     Comments: Bilateral +1 edema  Skin:    General: Skin is warm and dry.  Neurological:     Mental Status: He is alert and oriented to person, place, and time.      CMP Latest Ref Rng & Units 12/06/2018  Glucose 70 - 99 mg/dL 133(H)  BUN 8 - 23 mg/dL 21  Creatinine 0.61 - 1.24 mg/dL 1.27(H)  Sodium 135 - 145 mmol/L 138  Potassium 3.5 - 5.1 mmol/L 4.3  Chloride 98 - 111 mmol/L 104  CO2 22 - 32 mmol/L 29  Calcium 8.9 - 10.3 mg/dL 8.8(L)  Total Protein 6.5 - 8.1 g/dL 7.0  Total Bilirubin 0.3 - 1.2 mg/dL 0.2(L)  Alkaline Phos 38 - 126 U/L 88  AST 15 - 41 U/L 14(L)  ALT 0 - 44 U/L 13   CBC Latest Ref Rng & Units 12/06/2018  WBC 4.0 - 10.5 K/uL 8.5  Hemoglobin 13.0 - 17.0 g/dL 10.4(L)  Hematocrit 39.0 - 52.0 % 34.5(L)  Platelets 150 - 400 K/uL 331    No images are attached to the encounter.  Ct Abdomen Pelvis Wo Contrast  Result Date: 12/06/2018 CLINICAL DATA:  Metastatic prostate cancer. Restaging. Ongoing chemotherapy. EXAM: CT CHEST, ABDOMEN AND PELVIS WITHOUT CONTRAST TECHNIQUE: Multidetector CT imaging of the chest, abdomen and pelvis was performed following the standard protocol without IV contrast. COMPARISON:  02/05/2018 chest CT angiogram. 01/09/2018 CT abdomen/pelvis. 10/21/2017 CT chest, abdomen and pelvis. FINDINGS: CT CHEST FINDINGS Cardiovascular: Normal heart size. No significant pericardial effusion/thickening. Three-vessel coronary atherosclerosis. Atherosclerotic nonaneurysmal thoracic aorta. Normal caliber pulmonary arteries. Mediastinum/Nodes: No discrete thyroid nodules. Unremarkable esophagus. No axillary adenopathy. Mildly enlarged 1.0 cm right paratracheal node (series 2/image 23), stable. No new  pathologically enlarged mediastinal nodes. No discrete hilar adenopathy on this noncontrast scan. Lungs/Pleura: No pneumothorax. No pleural effusion. No acute consolidative airspace disease or lung masses. Stable tiny calcified apical right upper lobe granuloma. Subpleural 4 mm solid right middle lobe pulmonary nodule along the minor fissure (series 4/image 70) is stable since 11/11/2016 chest CT and considered benign. No new significant pulmonary nodules. Musculoskeletal: Widespread patchy confluent sclerotic osseous lesions throughout the sternum, thoracic spine, bilateral ribs and bilateral shoulder girdles have not appreciably changed. Mild thoracic spondylosis. Symmetric mild gynecomastia, stable. CT ABDOMEN PELVIS FINDINGS Hepatobiliary: Normal liver with no liver mass. Normal gallbladder with no radiopaque cholelithiasis. No biliary ductal dilatation. Pancreas: Normal, with no mass or duct dilation. Spleen: Normal size. No mass. Adrenals/Urinary Tract: Normal adrenals. No renal stones. No hydronephrosis. Stable 1.1 cm posterior upper right renal cyst. No additional contour deforming renal lesions. Normal bladder. Stomach/Bowel: Normal non-distended stomach. Normal caliber small bowel with no small bowel wall thickening. Normal appendix. Mild left colonic diverticulosis, with no definite large bowel wall thickening or significant pericolonic fat stranding. Vascular/Lymphatic: Atherosclerotic nonaneurysmal abdominal aorta. No pathologically enlarged lymph nodes in the abdomen or pelvis. Reproductive: Normal size prostate. Other: No pneumoperitoneum, ascites or focal fluid collection. Musculoskeletal: Widespread patchy confluent sclerotic osseous lesions throughout the lumbar spine, sacrum, bilateral iliac bones and proximal femora, not appreciably changed. No new focal osseous lesions. Moderate lumbar spondylosis. IMPRESSION: 1. No new or progressive metastatic disease. 2. Widespread patchy confluent sclerotic  osseous metastases throughout the axial and proximal appendicular skeleton, stable. 3. Mild mediastinal lymphadenopathy, stable. 4.  Aortic Atherosclerosis (ICD10-I70.0). Electronically Signed  By: Ilona Sorrel M.D.   On: 12/06/2018 16:35   Ct Chest Wo Contrast  Result Date: 12/06/2018 CLINICAL DATA:  Metastatic prostate cancer. Restaging. Ongoing chemotherapy. EXAM: CT CHEST, ABDOMEN AND PELVIS WITHOUT CONTRAST TECHNIQUE: Multidetector CT imaging of the chest, abdomen and pelvis was performed following the standard protocol without IV contrast. COMPARISON:  02/05/2018 chest CT angiogram. 01/09/2018 CT abdomen/pelvis. 10/21/2017 CT chest, abdomen and pelvis. FINDINGS: CT CHEST FINDINGS Cardiovascular: Normal heart size. No significant pericardial effusion/thickening. Three-vessel coronary atherosclerosis. Atherosclerotic nonaneurysmal thoracic aorta. Normal caliber pulmonary arteries. Mediastinum/Nodes: No discrete thyroid nodules. Unremarkable esophagus. No axillary adenopathy. Mildly enlarged 1.0 cm right paratracheal node (series 2/image 23), stable. No new pathologically enlarged mediastinal nodes. No discrete hilar adenopathy on this noncontrast scan. Lungs/Pleura: No pneumothorax. No pleural effusion. No acute consolidative airspace disease or lung masses. Stable tiny calcified apical right upper lobe granuloma. Subpleural 4 mm solid right middle lobe pulmonary nodule along the minor fissure (series 4/image 70) is stable since 11/11/2016 chest CT and considered benign. No new significant pulmonary nodules. Musculoskeletal: Widespread patchy confluent sclerotic osseous lesions throughout the sternum, thoracic spine, bilateral ribs and bilateral shoulder girdles have not appreciably changed. Mild thoracic spondylosis. Symmetric mild gynecomastia, stable. CT ABDOMEN PELVIS FINDINGS Hepatobiliary: Normal liver with no liver mass. Normal gallbladder with no radiopaque cholelithiasis. No biliary ductal  dilatation. Pancreas: Normal, with no mass or duct dilation. Spleen: Normal size. No mass. Adrenals/Urinary Tract: Normal adrenals. No renal stones. No hydronephrosis. Stable 1.1 cm posterior upper right renal cyst. No additional contour deforming renal lesions. Normal bladder. Stomach/Bowel: Normal non-distended stomach. Normal caliber small bowel with no small bowel wall thickening. Normal appendix. Mild left colonic diverticulosis, with no definite large bowel wall thickening or significant pericolonic fat stranding. Vascular/Lymphatic: Atherosclerotic nonaneurysmal abdominal aorta. No pathologically enlarged lymph nodes in the abdomen or pelvis. Reproductive: Normal size prostate. Other: No pneumoperitoneum, ascites or focal fluid collection. Musculoskeletal: Widespread patchy confluent sclerotic osseous lesions throughout the lumbar spine, sacrum, bilateral iliac bones and proximal femora, not appreciably changed. No new focal osseous lesions. Moderate lumbar spondylosis. IMPRESSION: 1. No new or progressive metastatic disease. 2. Widespread patchy confluent sclerotic osseous metastases throughout the axial and proximal appendicular skeleton, stable. 3. Mild mediastinal lymphadenopathy, stable. 4.  Aortic Atherosclerosis (ICD10-I70.0). Electronically Signed   By: Ilona Sorrel M.D.   On: 12/06/2018 16:35   Nm Bone Scan Whole Body  Result Date: 12/07/2018 CLINICAL DATA:  Prostate cancer. EXAM: NUCLEAR MEDICINE WHOLE BODY BONE SCAN TECHNIQUE: Whole body anterior and posterior images were obtained approximately 3 hours after intravenous injection of radiopharmaceutical. RADIOPHARMACEUTICALS:  21.8 mCi Technetium-94m MDP IV COMPARISON:  04/03/2018. FINDINGS: Bilateral renal function excretion. Multiple areas of increased activity noted throughout the ribs, sternum, cervical and thoracolumbar spine, pelvis, proximal femurs, and right scapula again noted. New punctate left tenth rib lesion noted. Similar findings  noted on prior exam. Activity about both shoulders, wrists, knees, and ankles/feet again noted most likely degenerative. IMPRESSION: Findings consistent with diffuse metastatic disease without significant change from 04/03/2018. New punctate left tenth rib lesion noted. Electronically Signed   By: Marcello Moores  Register   On: 12/07/2018 08:44     Assessment and plan- Patient is a 84 y.o. male with following issues:  1.  Castrate sensitive prostate cancer: Patient continues to be on Lupron and Zytiga and has had stable disease over the last 1-1/2-year.  I have reviewed CT chest abdomen and pelvis images independently as well as bone scan images and discussed  findings with the patient which show stable bone metastases.  No new areas of metastatic disease.  He will continue his Zytiga at this time.  He would be due for his repeat Lupron shot in April 2020.  2.  Patient does have significant fatigue and mild shortness of breath on exertion likely secondary to underlying cardiovascular disease as well as ADT.  If the symptoms get worse I will consider switching him to another antiandrogen agent like enzalutamide but given that he has had such a good response to prostate cancer for an extended period of time he will continue on Zytiga at this time.  3.  Patient also has an element of chronic kidney disease and anemia secondary to it.  His hemoglobin is remained stable greater than 10.  No indication for Procrit at this time.  4.  Neoplasm related pain: Patient complains of generalized low back pain as well as joint pain which will be partly secondary to his metastatic disease to his bones but also probably due to side effects of ADT.  He is on fentanyl patch and as needed oxycodone I will increase his oxycodone from 10 to 20 mg every 6 as needed keep his fentanyl patch at 25 mcg.  I will see him back in April 2020 with CBC and CMP.  He will get his Lupron injection at that time and I will reassess his pain as  well.   Visit Diagnosis 1. Prostate cancer metastatic to bone ([place])   2. High risk medication use   3. Neoplasm related pain   4. History of anemia due to chronic kidney disease      Dr. Randa Evens, MD, MPH [facility] [phone] 12/11/2018 8:34 AM

## 2018-12-20 MED FILL — predniSONE 5 MG TABS: 5 | 30 days supply | Qty: 60 | Fill #2

## 2018-12-20 MED FILL — ABIRATERONE ACETATE 250 MG: 250 | 30 days supply | Qty: 120 | Fill #2

## 2019-01-05 ENCOUNTER — Other Ambulatory Visit: Payer: Self-pay | Admitting: *Deleted

## 2019-01-05 MED ORDER — OXYCODONE HCL 10 MG PO TABS: 20.0000 mg | ORAL_TABLET | Freq: Four times a day (QID) | ORAL | 0 refills | Status: DC | PRN

## 2019-01-05 MED ORDER — FENTANYL 25 MCG/HR TD PT72: 1.0000 | MEDICATED_PATCH | TRANSDERMAL | 0 refills | Status: DC

## 2019-01-10 ENCOUNTER — Other Ambulatory Visit: Payer: Self-pay | Admitting: Oncology

## 2019-01-10 DIAGNOSIS — C7951 Secondary malignant neoplasm of bone: Principal | ICD-10-CM

## 2019-01-10 DIAGNOSIS — C61 Malignant neoplasm of prostate: Secondary | ICD-10-CM

## 2019-01-10 NOTE — Telephone Encounter (Signed)
CBC with Differential  Order: [id number]  Status:  Final result  Visible to patient:  Yes (MyChart)  Next appt:  02/02/2019 at 09:30 AM in Oncology ([facility])  Dx:  Prostate cancer metastatic to bone ([facility])   Ref Range & Units 87mo ago  WBC 4.0 - 10.5 K/uL 8.5   RBC 4.22 - 5.81 MIL/uL 3.79Low    Hemoglobin 13.0 - 17.0 g/dL 10.4Low    HCT 39.0 - 52.0 % 34.5Low    MCV 80.0 - 100.0 fL 91.0   MCH 26.0 - 34.0 pg 27.4   MCHC 30.0 - 36.0 g/dL 30.1   RDW 11.5 - 15.5 % 15.2   Platelets 150 - 400 K/uL 331   nRBC 0.0 - 0.2 % 0.0   Neutrophils Relative % % 83   Neutro Abs 1.7 - 7.7 K/uL 7.1   Lymphocytes Relative % 8   Lymphs Abs 0.7 - 4.0 K/uL 0.7   Monocytes Relative % 7   Monocytes Absolute 0.1 - 1.0 K/uL 0.6   Eosinophils Relative % 2   Eosinophils Absolute 0.0 - 0.5 K/uL 0.1   Basophils Relative % 0   Basophils Absolute 0.0 - 0.1 K/uL 0.0   Immature Granulocytes % 0   Abs Immature Granulocytes 0.00 - 0.07 K/uL 0.03   Comment: Performed at [facility], [address] [place], [place] [address]  Resulting Agency  [facility]      Specimen Collected: 12/06/18 15:40  Last Resulted: 12/06/18 15:48       Comprehensive metabolic panel  Order: [id number]  Status:  Final result  Visible to patient:  Yes (MyChart)  Next appt:  02/02/2019 at 09:30 AM in Oncology ([facility])  Dx:  Prostate cancer metastatic to bone ([facility])   Ref Range & Units 61mo ago  Sodium 135 - 145 mmol/L 138   Potassium 3.5 - 5.1 mmol/L 4.3   Chloride 98 - 111 mmol/L 104   CO2 22 - 32 mmol/L 29   Glucose, Bld 70 - 99 mg/dL 133High    BUN 8 - 23 mg/dL 21   Creatinine, Ser 0.61 - 1.24 mg/dL 1.27High    Calcium 8.9 - 10.3 mg/dL 8.8Low    Total Protein 6.5 - 8.1 g/dL 7.0   Albumin 3.5 - 5.0 g/dL 3.5   AST 15 - 41 U/L 14Low    ALT 0 - 44 U/L 13   Alkaline Phosphatase 38 - 126 U/L 88   Total Bilirubin 0.3 - 1.2 mg/dL 0.2Low    GFR calc non Af Amer >60 mL/min 53Low    GFR calc Af Amer >60 mL/min >60    Anion gap 5 - 15 5   Comment: Performed at [facility], [address] [name], [place] [address]  Resulting Agency  [facility] LAB      Specimen Collected: 12/06/18 10:36  Last Resulted: 12/06/18 10:58       PSA  Order: [id number]  Status:  Final result  Visible to patient:  Yes (MyChart)  Next appt:  02/02/2019 at 09:30 AM in Oncology ([facility])  Dx:  Prostate cancer metastatic to bone ([place])   Ref Range & Units 13mo ago  Prostatic Specific Antigen 0.00 - 4.00 ng/mL <0.01   Comment: (NOTE)  While PSA levels of <=4.0 ng/ml are reported as reference range, some  men with levels below 4.0 ng/ml can have prostate cancer and many men  with PSA above 4.0 ng/ml do not have prostate cancer. Other tests  such as free PSA, age specific reference ranges, PSA velocity and PSA  doubling time may be helpful especially in men less than 49 years  old.  Performed at [facility], [place] [address], [place], [place]  [address]   Resulting Agency  [facility] LAB      Specimen Collected: 12/06/18 10:36  Last Resulted: 12/06/18 17:06

## 2019-01-16 MED FILL — ABIRATERONE ACETATE 250 MG: 250 | 30 days supply | Qty: 120 | Fill #0

## 2019-01-16 MED FILL — predniSONE 5 MG TABS: 5 | 30 days supply | Qty: 60 | Fill #3

## 2019-02-01 ENCOUNTER — Other Ambulatory Visit: Payer: Self-pay

## 2019-02-01 ENCOUNTER — Other Ambulatory Visit: Payer: Self-pay | Admitting: *Deleted

## 2019-02-01 DIAGNOSIS — C61 Malignant neoplasm of prostate: Secondary | ICD-10-CM

## 2019-02-01 DIAGNOSIS — C7951 Secondary malignant neoplasm of bone: Principal | ICD-10-CM

## 2019-02-02 ENCOUNTER — Other Ambulatory Visit: Payer: Self-pay

## 2019-02-02 ENCOUNTER — Inpatient Hospital Stay (HOSPITAL_BASED_OUTPATIENT_CLINIC_OR_DEPARTMENT_OTHER): Payer: Medicare Other | Admitting: Oncology

## 2019-02-02 ENCOUNTER — Encounter: Payer: Self-pay | Admitting: Oncology

## 2019-02-02 ENCOUNTER — Inpatient Hospital Stay: Payer: Medicare Other

## 2019-02-02 ENCOUNTER — Inpatient Hospital Stay: Payer: Medicare Other | Attending: Oncology

## 2019-02-02 VITALS — BP 126/79 | HR 61 | Temp 97.1°F | Resp 18 | Ht 68.0 in | Wt 187.0 lb

## 2019-02-02 DIAGNOSIS — C61 Malignant neoplasm of prostate: Secondary | ICD-10-CM | POA: Insufficient documentation

## 2019-02-02 DIAGNOSIS — C7951 Secondary malignant neoplasm of bone: Principal | ICD-10-CM

## 2019-02-02 DIAGNOSIS — M810 Age-related osteoporosis without current pathological fracture: Secondary | ICD-10-CM

## 2019-02-02 DIAGNOSIS — N189 Chronic kidney disease, unspecified: Secondary | ICD-10-CM | POA: Diagnosis not present

## 2019-02-02 DIAGNOSIS — G893 Neoplasm related pain (acute) (chronic): Secondary | ICD-10-CM

## 2019-02-02 DIAGNOSIS — Z87891 Personal history of nicotine dependence: Secondary | ICD-10-CM

## 2019-02-02 DIAGNOSIS — Z79899 Other long term (current) drug therapy: Secondary | ICD-10-CM

## 2019-02-02 DIAGNOSIS — D631 Anemia in chronic kidney disease: Secondary | ICD-10-CM

## 2019-02-02 DIAGNOSIS — Z5111 Encounter for antineoplastic chemotherapy: Secondary | ICD-10-CM | POA: Diagnosis not present

## 2019-02-02 LAB — CBC WITH DIFFERENTIAL/PLATELET
Abs Immature Granulocytes: 0.03 10*3/uL (ref 0.00–0.07)
Basophils Absolute: 0 10*3/uL (ref 0.0–0.1)
Basophils Relative: 0 %
Eosinophils Absolute: 0.2 10*3/uL (ref 0.0–0.5)
Eosinophils Relative: 2 %
HCT: 36.8 % — ABNORMAL LOW (ref 39.0–52.0)
Hemoglobin: 11.4 g/dL — ABNORMAL LOW (ref 13.0–17.0)
Immature Granulocytes: 0 %
Lymphocytes Relative: 11 %
Lymphs Abs: 0.8 10*3/uL (ref 0.7–4.0)
MCH: 27.8 pg (ref 26.0–34.0)
MCHC: 31 g/dL (ref 30.0–36.0)
MCV: 89.8 fL (ref 80.0–100.0)
Monocytes Absolute: 0.5 10*3/uL (ref 0.1–1.0)
Monocytes Relative: 7 %
Neutro Abs: 5.8 10*3/uL (ref 1.7–7.7)
Neutrophils Relative %: 80 %
Platelets: 340 10*3/uL (ref 150–400)
RBC: 4.1 MIL/uL — ABNORMAL LOW (ref 4.22–5.81)
RDW: 15 % (ref 11.5–15.5)
WBC: 7.3 10*3/uL (ref 4.0–10.5)
nRBC: 0 % (ref 0.0–0.2)

## 2019-02-02 LAB — COMPREHENSIVE METABOLIC PANEL
ALT: 13 U/L (ref 0–44)
AST: 15 U/L (ref 15–41)
Albumin: 3.7 g/dL (ref 3.5–5.0)
Alkaline Phosphatase: 93 U/L (ref 38–126)
Anion gap: 7 (ref 5–15)
BUN: 24 mg/dL — ABNORMAL HIGH (ref 8–23)
CO2: 30 mmol/L (ref 22–32)
Calcium: 9.5 mg/dL (ref 8.9–10.3)
Chloride: 100 mmol/L (ref 98–111)
Creatinine, Ser: 1.6 mg/dL — ABNORMAL HIGH (ref 0.61–1.24)
GFR calc Af Amer: 46 mL/min — ABNORMAL LOW (ref >60)
GFR calc non Af Amer: 40 mL/min — ABNORMAL LOW (ref >60)
Glucose, Bld: 135 mg/dL — ABNORMAL HIGH (ref 70–99)
Potassium: 4.5 mmol/L (ref 3.5–5.1)
Sodium: 137 mmol/L (ref 135–145)
Total Bilirubin: 0.4 mg/dL (ref 0.3–1.2)
Total Protein: 7.4 g/dL (ref 6.5–8.1)

## 2019-02-02 LAB — PSA: Prostatic Specific Antigen: <0.01 ng/mL (ref 0.00–4.00)

## 2019-02-02 MED ORDER — LEUPROLIDE ACETATE (3 MONTH) 22.5 MG IM KIT
22.5000 mg | PACK | Freq: Once | INTRAMUSCULAR | Status: AC
  Administered 2019-02-02: 22.5 mg via INTRAMUSCULAR
  Filled 2019-02-02: qty 22.5

## 2019-02-02 NOTE — Progress Notes (Signed)
Pt. Still has a lot of pain and needs pain refill

## 2019-02-03 ENCOUNTER — Other Ambulatory Visit: Payer: Self-pay | Admitting: *Deleted

## 2019-02-03 MED ORDER — FENTANYL 25 MCG/HR TD PT72: 1.0000 | MEDICATED_PATCH | TRANSDERMAL | 0 refills | Status: DC

## 2019-02-03 MED ORDER — OXYCODONE HCL 10 MG PO TABS: 20.0000 mg | ORAL_TABLET | Freq: Four times a day (QID) | ORAL | 0 refills | Status: DC | PRN

## 2019-02-05 NOTE — Progress Notes (Signed)
Hematology/Oncology Consult note [facility]  [phone] [phone]  Patient Care Team: [name], MD as PCP - General (Internal Medicine)   Name of the patient: Johnathan Gould  [id number]  12-Feb-1935   Date of visit: 02/05/19  Diagnosis- 1. 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 Anemia of chronic kidney disease   Chief complaint/ Reason for visit-routine follow-up of prostate cancer on Zytiga and to receive Lupron  Heme/Onc history: patient is a54 year old male was recently admitted to the hospital on 10/16/2016 with symptoms of left hip pain and renal failure. He was found to have bilateral hydronephrosis and is status post bilateral nephrostomy tube placement. CT abdomen showed an irregular polypoid mass at the posterior aspect of the bladder highly concerning for bladder carcinoma. Extensive bulky adenopathy throughout the abdomen and pelvis consistent with nodal metastases. Widespread osseous metastatic disease. Bone scan showed multifocal areas of increased activity throughout the pelvis, bilateral femurs, rib cage, right scapula, thoracic cervical and lumbar spine consistent with metastatic disease. Patient was found to have enlarged abnormal prostate as well as an elevated PSA of 469 and was presumptively diagnosed with metastatic prostate cancer. He was started on Firmagon by urology. He was also seen by radiation oncology and started palliative radiation to his left hip on 11/04/2016 and finished 10 doses of palliative Rt.  He is currently on lupron through usevery 3 months  4 core biopsy obtained for tissue diagnosis which showed: prostate adenocarcinoma gleasons 9 and 10 in all 4 cores  zytiga started in March 2018. He is on fosamax for his osteoporosiswhich was stopped due to heartburn  Patient was admitted to the hospital on 02/05/18 with worsening epigastric pain. He was subsequently found to have acute  MITwo-vessel coronary artery disease, with 80% stenosis proximal LAD, occluded mid left circumflex, subtotal occlusion OM1, and patent stent mid RCAand underwent DES placement  Interval history- he still has low back pain and pain in his bilateral legs well controlled with pain meds. Denies any fever, cough or sob. Has chronic fatigue  ECOG PS- 1 Pain scale- 0 Opioid associated constipation- no  Review of systems- Review of Systems  Constitutional: Positive for malaise/fatigue. Negative for chills, fever and weight loss.  HENT: Negative for congestion, ear discharge and nosebleeds.   Eyes: Negative for blurred vision.  Respiratory: Negative for cough, hemoptysis, sputum production, shortness of breath and wheezing.   Cardiovascular: Negative for chest pain, palpitations, orthopnea and claudication.  Gastrointestinal: Negative for abdominal pain, blood in stool, constipation, diarrhea, heartburn, melena, nausea and vomiting.  Genitourinary: Negative for dysuria, flank pain, frequency, hematuria and urgency.  Musculoskeletal: Positive for back pain. Negative for joint pain and myalgias.  Skin: Negative for rash.  Neurological: Negative for dizziness, tingling, focal weakness, seizures, weakness and headaches.  Endo/Heme/Allergies: Does not bruise/bleed easily.  Psychiatric/Behavioral: Negative for depression and suicidal ideas. The patient does not have insomnia.        No Known Allergies   Past Medical History:  Diagnosis Date  . Arthritis   . CAD (coronary artery disease)   . Hypertension   . Prostate cancer ([place])    metastatic   . RAD (reactive airway disease)      Past Surgical History:  Procedure Laterality Date  . CORONARY ANGIOPLASTY WITH STENT PLACEMENT    . CORONARY STENT INTERVENTION N/A 02/07/2018   Procedure: CORONARY STENT INTERVENTION;  Surgeon: Isaias Cowman, MD;  Location: [facility];  Service: Cardiovascular;  Laterality: N/A;  . IR GENERIC  HISTORICAL  10/17/2016   IR NEPHROSTOMY PLACEMENT RIGHT 10/17/2016 [facility]  . IR GENERIC HISTORICAL  10/17/2016   IR NEPHROSTOMY PLACEMENT LEFT 10/17/2016 [name], MD [facility]  . IR GENERIC HISTORICAL  12/23/2016   IR NEPHROSTOMY EXCHANGE LEFT 12/23/2016 [facility]  . IR GENERIC HISTORICAL  12/23/2016   IR NEPHROSTOMY EXCHANGE RIGHT 12/23/2016 [facility]  . IR NEPHROSTOMY EXCHANGE LEFT  02/11/2017  . IR NEPHROSTOMY EXCHANGE RIGHT  02/11/2017  . KNEE SURGERY Left 2006   Laparascopy done on left knee, scar tissue taken out  . LEFT HEART CATH AND CORONARY ANGIOGRAPHY N/A 02/07/2018   Procedure: LEFT HEART CATH AND CORONARY ANGIOGRAPHY;  Surgeon: Isaias Cowman, MD;  Location: [facility];  Service: Cardiovascular;  Laterality: N/A;  . nephrostomy tubes Bilateral     Social History   Socioeconomic History  . Marital status: Married    Spouse name: Not on file  . Number of children: Not on file  . Years of education: Not on file  . Highest education level: Not on file  Occupational History  . Not on file  Social Needs  . Financial resource strain: Not on file  . Food insecurity:    Worry: Not on file    Inability: Not on file  . Transportation needs:    Medical: Not on file    Non-medical: Not on file  Tobacco Use  . Smoking status: Former Smoker    Packs/day: 1.00    Years: 69.50    Pack years: 69.50    Last attempt to quit: 11/04/2014    Years since quitting: 4.2  . Smokeless tobacco: Never Used  Substance and Sexual Activity  . Alcohol use: No  . Drug use: No  . Sexual activity: Yes  Lifestyle  . Physical activity:    Days per week: Not on file    Minutes per session: Not on file  . Stress: Not on file  Relationships  . Social connections:    Talks on phone: Not on file    Gets together: Not on file    Attends religious service: Not on file    Active member of club or organization: Not on file    Attends meetings of clubs or  organizations: Not on file    Relationship status: Not on file  . Intimate partner violence:    Fear of current or ex partner: Not on file    Emotionally abused: Not on file    Physically abused: Not on file    Forced sexual activity: Not on file  Other Topics Concern  . Not on file  Social History Narrative  . Not on file    Family History  Problem Relation Age of Onset  . Hypertension Other   . Lung cancer Brother      Current Outpatient Medications:  .  abiraterone acetate (ZYTIGA) 250 MG tablet, TAKE 4 TABLETS (1,000 MG TOTAL) BY MOUTH DAILY. TAKE ON AN EMPTY STOMACH 1 HOUR BEFORE OR 2 HOURS AFTER A MEAL, Disp: 120 tablet, Rfl: 2 .  aspirin EC 81 MG tablet, Take 81 mg by mouth daily. , Disp: , Rfl:  .  clopidogrel (PLAVIX) 75 MG tablet, Take 75 mg by mouth daily., Disp: , Rfl: 11 .  furosemide (LASIX) 20 MG tablet, TAKE 1 TABLET BY MOUTH ONCE DAILY, Disp: 7 tablet, Rfl: 0 .  gabapentin (NEURONTIN) 100 MG capsule, Take 1 capsule (100 mg total)  by mouth at bedtime., Disp: 30 capsule, Rfl: 0 .  metoprolol (LOPRESSOR) 50 MG tablet, Take 50 mg by mouth 2 (two) times daily. , Disp: , Rfl:  .  nitroGLYCERIN (NITROSTAT) 0.4 MG SL tablet, Place 0.4 mg under the tongue every 5 (five) minutes x 3 doses as needed for chest pain., Disp: , Rfl:  .  omeprazole (PRILOSEC) 40 MG capsule, Take 40 mg by mouth daily., Disp: , Rfl:  .  ondansetron (ZOFRAN ODT) 4 MG disintegrating tablet, Take 1 tablet (4 mg total) by mouth every 8 (eight) hours as needed for nausea or vomiting., Disp: 40 tablet, Rfl: 2 .  ondansetron (ZOFRAN) 4 MG tablet, Take 1 tablet (4 mg total) by mouth every 8 (eight) hours as needed for nausea or vomiting., Disp: 30 tablet, Rfl: 3 .  predniSONE (DELTASONE) 5 MG tablet, TAKE 1 TABLET (5 MG TOTAL) BY MOUTH 2 TIMES DAILY WITH A MEAL., Disp: 60 tablet, Rfl: 5 .  sennosides-docusate sodium (SENOKOT-S) 8.6-50 MG tablet, Take 2 tablets by mouth 2 (two) times daily. , Disp: , Rfl:  .   tamsulosin (FLOMAX) 0.4 MG CAPS capsule, Take 1 capsule (0.4 mg total) by mouth daily., Disp: 30 capsule, Rfl: 11 .  fentaNYL (DURAGESIC) 25 MCG/HR, Place 1 patch onto the skin every 3 (three) days., Disp: 10 patch, Rfl: 0 .  Oxycodone HCl 10 MG TABS, Take 2 tablets (20 mg total) by mouth every 6 (six) hours as needed., Disp: 240 tablet, Rfl: 0 .  pantoprazole (PROTONIX) 40 MG tablet, Take 1 tablet (40 mg total) by mouth 2 (two) times daily., Disp: 60 tablet, Rfl: 0 .  torsemide (DEMADEX) 20 MG tablet, TAKE 1 TABLET BY MOUTH ONCE DAILY IN THE MORNING AS DIRECTED, Disp: , Rfl: 5 No current facility-administered medications for this visit.   Facility-Administered Medications Ordered in Other Visits:  .  0.9 %  sodium chloride infusion, , Intravenous, Continuous, Sindy Guadeloupe, MD, Stopped at 04/28/18 1452 .  Leuprolide Acetate (6 Month) (LUPRON) injection 45 mg, 45 mg, Intramuscular, Q6 months, Sindy Guadeloupe, MD  Physical exam:  Vitals:   02/02/19 1007  BP: 126/79  Pulse: 61  Resp: 18  Temp: (!) 97.1 F (36.2 C)  TempSrc: Tympanic  Weight: 187 lb (84.8 kg)  Height: 5\' 8"  (1.727 m)   Physical Exam Constitutional:      General: He is not in acute distress. HENT:     Head: Normocephalic and atraumatic.  Eyes:     Pupils: Pupils are equal, round, and reactive to light.  Neck:     Musculoskeletal: Normal range of motion.  Cardiovascular:     Rate and Rhythm: Normal rate and regular rhythm.     Heart sounds: Normal heart sounds.  Pulmonary:     Effort: Pulmonary effort is normal.     Breath sounds: Normal breath sounds.  Abdominal:     General: Bowel sounds are normal.     Palpations: Abdomen is soft.  Skin:    General: Skin is warm and dry.  Neurological:     Mental Status: He is alert and oriented to person, place, and time.      CMP Latest Ref Rng & Units 02/02/2019  Glucose 70 - 99 mg/dL 135(H)  BUN 8 - 23 mg/dL 24(H)  Creatinine 0.61 - 1.24 mg/dL 1.60(H)  Sodium 135 -  145 mmol/L 137  Potassium 3.5 - 5.1 mmol/L 4.5  Chloride 98 - 111 mmol/L 100  CO2 22 -  32 mmol/L 30  Calcium 8.9 - 10.3 mg/dL 9.5  Total Protein 6.5 - 8.1 g/dL 7.4  Total Bilirubin 0.3 - 1.2 mg/dL 0.4  Alkaline Phos 38 - 126 U/L 93  AST 15 - 41 U/L 15  ALT 0 - 44 U/L 13   CBC Latest Ref Rng & Units 02/02/2019  WBC 4.0 - 10.5 K/uL 7.3  Hemoglobin 13.0 - 17.0 g/dL 11.4(L)  Hematocrit 39.0 - 52.0 % 36.8(L)  Platelets 150 - 400 K/uL 340     Assessment and plan- Patient is a 84 y.o. male with following issues:  1. Castrate sensitive prostate cancer- PSA continues to be <0.01. toleratign zytiga well and will continue that. lupron today. rtc in 3 months. See md. Labs : CBC with differential, CMP and PSA.  Gets Lupron.  2.  Neoplasm related pain: Continue PRN oxycodone and fentanyl patch  3.  CKD: Stable continue to monitor     Visit Diagnosis 1. High risk medication use   2. Neoplasm related pain   3. Prostate cancer [facility])      Dr. Randa Evens, MD, MPH [facility] at [facility] [phone] 02/05/2019 9:35 AM

## 2019-02-09 ENCOUNTER — Telehealth: Payer: Self-pay | Admitting: Pharmacist

## 2019-02-09 MED FILL — ABIRATERONE ACETATE 250 MG: 250 | 30 days supply | Qty: 120 | Fill #1

## 2019-02-09 MED FILL — predniSONE 5 MG TABS: 5 | 30 days supply | Qty: 60 | Fill #4

## 2019-02-09 NOTE — Telephone Encounter (Signed)
Oral Chemotherapy Pharmacist Encounter  Successfully enrolled patient for copayment assistance funds from [employer] from the metastatic prostate cancer fund.  Award amount: $6,500 Effective dates: 02/09/2019 - 02/09/2020 ID: [id number] BIN: [id number] Group: [id number] PCN: [id number]  Billing information will be shared with [facility]. I will place a copy of the award letter to be scanned into patient's chart.  Attempted to call [name] to let him know about the fund, LVM for him to call me back.  Darl Pikes, PharmD, BCPS Hematology/Oncology Clinical Pharmacist [facility] [phone]  02/09/2019 12:20 PM

## 2019-02-13 ENCOUNTER — Telehealth: Payer: Self-pay | Admitting: *Deleted

## 2019-02-13 NOTE — Telephone Encounter (Signed)
Pt called and left 2 voice mails for [place]. He had deleted msgs left by Alyson and did not know what our clinic needed. I explained to him that Alyson was attempting to reach him to let him know that he was approved to help him with the out of pocket expense for zytiga. He stated that he was already aware of this, but thanked me for returning his call.  Alyson- no need to return his call. This was handled. Thanks.-Johnavon Mcclafferty

## 2019-03-01 ENCOUNTER — Telehealth: Payer: Self-pay | Admitting: Pharmacist

## 2019-03-01 NOTE — Telephone Encounter (Signed)
Oral Chemotherapy Pharmacist Encounter  Follow-Up Form  Called patient today to follow up regarding patient's oral chemotherapy medication: Zytiga/prednisone  Original Start date of oral chemotherapy: 12/2016  Pt reports 0 tablets/doses of Zytiga missed in the last 4 weeks.   Pt reports the following side effects: None reported  Recent labs reviewed: PSA from 02/02/2019  New medications?: None reported  Other Issues: None reported  Patient knows to call the office with questions or concerns. Oral Oncology Clinic will continue to follow.  Darl Pikes, PharmD, BCPS, [facility] Hematology/Oncology Clinical Pharmacist [facility] [phone]  03/01/2019 10:24 AM

## 2019-03-05 ENCOUNTER — Other Ambulatory Visit: Payer: Self-pay | Admitting: *Deleted

## 2019-03-05 MED ORDER — OXYCODONE HCL 10 MG PO TABS: 20.0000 mg | ORAL_TABLET | Freq: Four times a day (QID) | ORAL | 0 refills | Status: DC | PRN

## 2019-03-05 MED ORDER — FENTANYL 25 MCG/HR TD PT72: 1.0000 | MEDICATED_PATCH | TRANSDERMAL | 0 refills | Status: DC

## 2019-03-05 NOTE — Telephone Encounter (Signed)
Pt called today and left message that he needs pain patches and oxycodone. He last had it filled on 4/4 and today 5/4 so it is due and I have sent it in for [name] to sign and patient aware we will refill it

## 2019-03-06 MED FILL — ABIRATERONE ACETATE 250 MG: 250 | 30 days supply | Qty: 120 | Fill #2

## 2019-03-06 MED FILL — predniSONE 5 MG TABS: 5 | 30 days supply | Qty: 60 | Fill #5

## 2019-03-28 ENCOUNTER — Other Ambulatory Visit: Payer: Self-pay | Admitting: Oncology

## 2019-03-28 DIAGNOSIS — C61 Malignant neoplasm of prostate: Secondary | ICD-10-CM

## 2019-03-28 DIAGNOSIS — C7951 Secondary malignant neoplasm of bone: Secondary | ICD-10-CM

## 2019-04-02 ENCOUNTER — Other Ambulatory Visit (HOSPITAL_COMMUNITY): Payer: Self-pay | Admitting: Internal Medicine

## 2019-04-02 ENCOUNTER — Other Ambulatory Visit: Payer: Self-pay | Admitting: Internal Medicine

## 2019-04-02 ENCOUNTER — Telehealth: Payer: Self-pay | Admitting: *Deleted

## 2019-04-02 ENCOUNTER — Other Ambulatory Visit: Payer: Self-pay | Admitting: *Deleted

## 2019-04-02 DIAGNOSIS — R2243 Localized swelling, mass and lump, lower limb, bilateral: Secondary | ICD-10-CM

## 2019-04-02 DIAGNOSIS — N320 Bladder-neck obstruction: Secondary | ICD-10-CM

## 2019-04-02 DIAGNOSIS — R309 Painful micturition, unspecified: Secondary | ICD-10-CM

## 2019-04-02 MED FILL — ABIRATERONE ACETATE 250 MG: 250 | 30 days supply | Qty: 120 | Fill #0

## 2019-04-02 MED FILL — predniSONE 5 MG TABS: 5 | 30 days supply | Qty: 60 | Fill #0

## 2019-04-02 NOTE — Telephone Encounter (Signed)
Patient called today to say that his right leg is really swollen, left leg is slightly swollen and his privates are all swollen.  He went to see his primary care Dr. Rebecka Apley.  He drew lab work on him and told him he was going to schedule an ultrasound.  He recommended to the patient that he should call over to the cancer center.  Spoke with Dr. Rogue Bussing who is on-call today for Dr. [name] and he will see patient tomorrow a.m..  I told the patient that he can come in in the morning at 845 and he will be seen Dr. Rogue Bussing because he is the doctor on call since Dr. Judithann Graves is on remote this week. he is agreeable to plan

## 2019-04-03 ENCOUNTER — Encounter: Payer: Self-pay | Admitting: Oncology

## 2019-04-03 ENCOUNTER — Inpatient Hospital Stay: Payer: Medicare Other | Attending: Internal Medicine | Admitting: Oncology

## 2019-04-03 ENCOUNTER — Other Ambulatory Visit: Payer: Self-pay

## 2019-04-03 VITALS — BP 151/85 | HR 73 | Temp 99.0°F | Resp 18 | Ht 68.0 in | Wt 188.8 lb

## 2019-04-03 DIAGNOSIS — C61 Malignant neoplasm of prostate: Secondary | ICD-10-CM | POA: Diagnosis present

## 2019-04-03 DIAGNOSIS — M7989 Other specified soft tissue disorders: Secondary | ICD-10-CM

## 2019-04-03 DIAGNOSIS — R1030 Lower abdominal pain, unspecified: Secondary | ICD-10-CM | POA: Diagnosis not present

## 2019-04-03 DIAGNOSIS — Z87891 Personal history of nicotine dependence: Secondary | ICD-10-CM

## 2019-04-03 DIAGNOSIS — C7951 Secondary malignant neoplasm of bone: Secondary | ICD-10-CM | POA: Diagnosis not present

## 2019-04-03 DIAGNOSIS — G893 Neoplasm related pain (acute) (chronic): Secondary | ICD-10-CM | POA: Diagnosis not present

## 2019-04-03 DIAGNOSIS — M79604 Pain in right leg: Secondary | ICD-10-CM

## 2019-04-03 DIAGNOSIS — M810 Age-related osteoporosis without current pathological fracture: Secondary | ICD-10-CM

## 2019-04-03 MED ORDER — FENTANYL 25 MCG/HR TD PT72: 1.0000 | MEDICATED_PATCH | TRANSDERMAL | 0 refills | Status: DC

## 2019-04-03 MED ORDER — OXYCODONE HCL 10 MG PO TABS: 20.0000 mg | ORAL_TABLET | Freq: Four times a day (QID) | ORAL | 0 refills | Status: DC | PRN

## 2019-04-03 NOTE — Progress Notes (Signed)
Hematology/Oncology Consult note [facility]  [phone] [phone]  Patient Care Team: [name], MD as PCP - General (Internal Medicine)   Name of the patient: Johnathan Gould  [id number]  1935/08/24   Date of visit: 04/03/19  Diagnosis-  1. 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 Anemia of chronic kidney disease  Chief complaint/ Reason for visit-sick visit for right lower extremity edema  Heme/Onc history: patient is a84 year old male was recently admitted to the hospital on 10/16/2016 with symptoms of left hip pain and renal failure. He was found to have bilateral hydronephrosis and is status post bilateral nephrostomy tube placement. CT abdomen showed an irregular polypoid mass at the posterior aspect of the bladder highly concerning for bladder carcinoma. Extensive bulky adenopathy throughout the abdomen and pelvis consistent with nodal metastases. Widespread osseous metastatic disease. Bone scan showed multifocal areas of increased activity throughout the pelvis, bilateral femurs, rib cage, right scapula, thoracic cervical and lumbar spine consistent with metastatic disease. Patient was found to have enlarged abnormal prostate as well as an elevated PSA of 469 and was presumptively diagnosed with metastatic prostate cancer. He was started on Firmagon by urology. He was also seen by radiation oncology and started palliative radiation to his left hip on 11/04/2016 and finished 10 doses of palliative Rt.  He is currently on lupron through usevery 3 months  4 core biopsy obtained for tissue diagnosis which showed: prostate adenocarcinoma gleasons 9 and 10 in all 4 cores  zytiga started in March 2018. He is on fosamax for his osteoporosiswhich was stopped due to heartburn  Patient was admitted to the hospital on 02/05/18 with worsening epigastric pain. He was subsequently found to have acute MITwo-vessel coronary artery  disease, with 80% stenosis proximal LAD, occluded mid left circumflex, subtotal occlusion OM1, and patent stent mid RCAand underwent DES placement   Interval history-reports pain and pressure over his lower abdomen and feels that his scrotum is swollen.  Also reports that his right leg has been swollen for the last week to 10 days and it is making it difficult for him to walk.  He was seen by Dr. Rebecka Apley.  Ultrasound of right lower extremity is due in 4 days time  ECOG PS- 1 Pain scale- 3 Opioid associated constipation- no  Review of systems- Review of Systems  Constitutional: Negative for chills, fever, malaise/fatigue and weight loss.  HENT: Negative for congestion, ear discharge and nosebleeds.   Eyes: Negative for blurred vision.  Respiratory: Negative for cough, hemoptysis, sputum production, shortness of breath and wheezing.   Cardiovascular: Positive for leg swelling (Right greater than left). Negative for chest pain, palpitations, orthopnea and claudication.  Gastrointestinal: Positive for abdominal pain. Negative for blood in stool, constipation, diarrhea, heartburn, melena, nausea and vomiting.  Genitourinary: Negative for dysuria, flank pain, frequency, hematuria and urgency.  Musculoskeletal: Negative for back pain, joint pain and myalgias.       Right leg pain  Skin: Negative for rash.  Neurological: Negative for dizziness, tingling, focal weakness, seizures, weakness and headaches.  Endo/Heme/Allergies: Does not bruise/bleed easily.  Psychiatric/Behavioral: Negative for depression and suicidal ideas. The patient does not have insomnia.       No Known Allergies   Past Medical History:  Diagnosis Date  . Arthritis   . CAD (coronary artery disease)   . Hypertension   . Prostate cancer ([place])    metastatic   . RAD (reactive airway disease)  Past Surgical History:  Procedure Laterality Date  . CORONARY ANGIOPLASTY WITH STENT PLACEMENT    . CORONARY STENT  INTERVENTION N/A 02/07/2018   Procedure: CORONARY STENT INTERVENTION;  Surgeon: Isaias Cowman, MD;  Location: [facility];  Service: Cardiovascular;  Laterality: N/A;  . IR GENERIC HISTORICAL  10/17/2016   IR NEPHROSTOMY PLACEMENT RIGHT 10/17/2016 [facility]  . IR GENERIC HISTORICAL  10/17/2016   IR NEPHROSTOMY PLACEMENT LEFT 10/17/2016 [name], MD [facility]  . IR GENERIC HISTORICAL  12/23/2016   IR NEPHROSTOMY EXCHANGE LEFT 12/23/2016 [facility]  . IR GENERIC HISTORICAL  12/23/2016   IR NEPHROSTOMY EXCHANGE RIGHT 12/23/2016 [facility]  . IR NEPHROSTOMY EXCHANGE LEFT  02/11/2017  . IR NEPHROSTOMY EXCHANGE RIGHT  02/11/2017  . KNEE SURGERY Left 2006   Laparascopy done on left knee, scar tissue taken out  . LEFT HEART CATH AND CORONARY ANGIOGRAPHY N/A 02/07/2018   Procedure: LEFT HEART CATH AND CORONARY ANGIOGRAPHY;  Surgeon: Isaias Cowman, MD;  Location: [facility] [place] [facility];  Service: Cardiovascular;  Laterality: N/A;  . nephrostomy tubes Bilateral     Social History   Socioeconomic History  . Marital status: Married    Spouse name: Not on file  . Number of children: Not on file  . Years of education: Not on file  . Highest education level: Not on file  Occupational History  . Not on file  Social Needs  . Financial resource strain: Not on file  . Food insecurity:    Worry: Not on file    Inability: Not on file  . Transportation needs:    Medical: Not on file    Non-medical: Not on file  Tobacco Use  . Smoking status: Former Smoker    Packs/day: 1.00    Years: 69.50    Pack years: 69.50    Last attempt to quit: 11/04/2014    Years since quitting: 4.4  . Smokeless tobacco: Never Used  Substance and Sexual Activity  . Alcohol use: No  . Drug use: No  . Sexual activity: Yes  Lifestyle  . Physical activity:    Days per week: Not on file    Minutes per session: Not on file  . Stress: Not on file  Relationships  . Social  connections:    Talks on phone: Not on file    Gets together: Not on file    Attends religious service: Not on file    Active member of club or organization: Not on file    Attends meetings of clubs or organizations: Not on file    Relationship status: Not on file  . Intimate partner violence:    Fear of current or ex partner: Not on file    Emotionally abused: Not on file    Physically abused: Not on file    Forced sexual activity: Not on file  Other Topics Concern  . Not on file  Social History Narrative  . Not on file    Family History  Problem Relation Age of Onset  . Hypertension Other   . Lung cancer Brother      Current Outpatient Medications:  .  abiraterone acetate (ZYTIGA) 250 MG tablet, TAKE 4 TABLETS (1,000 MG TOTAL) BY MOUTH DAILY. TAKE ON AN EMPTY STOMACH 1 HOUR BEFORE OR 2 HOURS AFTER A MEAL, Disp: 120 tablet, Rfl: 2 .  aspirin EC 81 MG tablet, Take 81 mg by mouth daily. , Disp: , Rfl:  .  clopidogrel (PLAVIX) 75  MG tablet, Take 75 mg by mouth daily., Disp: , Rfl: 11 .  fentaNYL (DURAGESIC) 25 MCG/HR, Place 1 patch onto the skin every 3 (three) days., Disp: 10 patch, Rfl: 0 .  furosemide (LASIX) 20 MG tablet, TAKE 1 TABLET BY MOUTH ONCE DAILY, Disp: 7 tablet, Rfl: 0 .  gabapentin (NEURONTIN) 100 MG capsule, Take 1 capsule (100 mg total) by mouth at bedtime., Disp: 30 capsule, Rfl: 0 .  metoprolol (LOPRESSOR) 50 MG tablet, Take 50 mg by mouth 2 (two) times daily. , Disp: , Rfl:  .  nitroGLYCERIN (NITROSTAT) 0.4 MG SL tablet, Place 0.4 mg under the tongue every 5 (five) minutes x 3 doses as needed for chest pain., Disp: , Rfl:  .  omeprazole (PRILOSEC) 40 MG capsule, Take 40 mg by mouth daily., Disp: , Rfl:  .  ondansetron (ZOFRAN ODT) 4 MG disintegrating tablet, Take 1 tablet (4 mg total) by mouth every 8 (eight) hours as needed for nausea or vomiting., Disp: 40 tablet, Rfl: 2 .  ondansetron (ZOFRAN) 4 MG tablet, Take 1 tablet (4 mg total) by mouth every 8 (eight)  hours as needed for nausea or vomiting., Disp: 30 tablet, Rfl: 3 .  Oxycodone HCl 10 MG TABS, Take 2 tablets (20 mg total) by mouth every 6 (six) hours as needed for up to 30 days., Disp: 240 tablet, Rfl: 0 .  pantoprazole (PROTONIX) 40 MG tablet, Take 1 tablet (40 mg total) by mouth 2 (two) times daily., Disp: 60 tablet, Rfl: 0 .  predniSONE (DELTASONE) 5 MG tablet, TAKE 1 TABLET (5 MG TOTAL) BY MOUTH 2 TIMES DAILY WITH A MEAL., Disp: 60 tablet, Rfl: 5 .  sennosides-docusate sodium (SENOKOT-S) 8.6-50 MG tablet, Take 2 tablets by mouth 2 (two) times daily. , Disp: , Rfl:  .  tamsulosin (FLOMAX) 0.4 MG CAPS capsule, Take 1 capsule (0.4 mg total) by mouth daily., Disp: 30 capsule, Rfl: 11 .  torsemide (DEMADEX) 20 MG tablet, TAKE 1 TABLET BY MOUTH ONCE DAILY IN THE MORNING AS DIRECTED, Disp: , Rfl: 5 No current facility-administered medications for this visit.   Facility-Administered Medications Ordered in Other Visits:  .  0.9 %  sodium chloride infusion, , Intravenous, Continuous, Sindy Guadeloupe, MD, Stopped at 04/28/18 1452 .  Leuprolide Acetate (6 Month) (LUPRON) injection 45 mg, 45 mg, Intramuscular, Q6 months, Sindy Guadeloupe, MD  Physical exam:  Vitals:   04/03/19 0911  BP: (!) 151/85  Pulse: 73  Resp: 18  Temp: 99 F (37.2 C)  TempSrc: Tympanic  Weight: 188 lb 12.8 oz (85.6 kg)  Height: 5\' 8"  (1.727 m)   Physical Exam Constitutional:      General: He is not in acute distress. HENT:     Head: Normocephalic and atraumatic.  Eyes:     Pupils: Pupils are equal, round, and reactive to light.  Neck:     Musculoskeletal: Normal range of motion.  Cardiovascular:     Rate and Rhythm: Normal rate and regular rhythm.     Heart sounds: Normal heart sounds.  Pulmonary:     Effort: Pulmonary effort is normal.     Breath sounds: Normal breath sounds.  Abdominal:     General: Bowel sounds are normal. There is no distension.     Palpations: Abdomen is soft.     Tenderness: There is no  abdominal tenderness.     Comments: No scrotal edema observed.  No palpable bilateral inguinal adenopathy  Musculoskeletal:     Comments:  Right lower extremity is significantly more swollen than left  Skin:    General: Skin is warm and dry.  Neurological:     Mental Status: He is alert and oriented to person, place, and time.      CMP Latest Ref Rng & Units 02/02/2019  Glucose 70 - 99 mg/dL 135(H)  BUN 8 - 23 mg/dL 24(H)  Creatinine 0.61 - 1.24 mg/dL 1.60(H)  Sodium 135 - 145 mmol/L 137  Potassium 3.5 - 5.1 mmol/L 4.5  Chloride 98 - 111 mmol/L 100  CO2 22 - 32 mmol/L 30  Calcium 8.9 - 10.3 mg/dL 9.5  Total Protein 6.5 - 8.1 g/dL 7.4  Total Bilirubin 0.3 - 1.2 mg/dL 0.4  Alkaline Phos 38 - 126 U/L 93  AST 15 - 41 U/L 15  ALT 0 - 44 U/L 13   CBC Latest Ref Rng & Units 02/02/2019  WBC 4.0 - 10.5 K/uL 7.3  Hemoglobin 13.0 - 17.0 g/dL 11.4(L)  Hematocrit 39.0 - 52.0 % 36.8(L)  Platelets 150 - 400 K/uL 340     Assessment and plan- Patient is a 84 y.o. male with following issues:  1.  Right lower extremity swelling: Awaiting ultrasound right lower extremity which will be done on Friday.  We are not able to get it done any sooner.  He also has an ultrasound pelvis scheduled at that time.  I doubt this is progression of his prostate cancer causing enlarged lymph nodes and compression as his scans from February 2020 did not show any significant abdominal adenopathy and his PSA remains suppressed.  I will consider CT abdomen and pelvis without contrast if ultrasound does not give [place] answers.  I do not appreciate any palpable adenopathy in his inguinal region.  I also did not notice any scrotal edema on my examination today  2.  Castrate resistant prostate cancer: He is doing really well on Zytiga and Lupron which he will continue.  He is due for his Lupron shot in July 2020 and he will see me next month with a CBC with differential, CMP and PSA.  3.  Neoplasm related pain: He has chronic low  back pain and I have refilled his fentanyl and oxycodone at this time   Visit Diagnosis 1. Pain and swelling of lower extremity, right   2. Prostate cancer metastatic to bone ([place])   3. Neoplasm related pain      Dr. Randa Evens, MD, MPH [facility] [phone] 04/03/2019 11:10 AM

## 2019-04-03 NOTE — Progress Notes (Signed)
Pt swelling has been bothering him for about a  Month. But as bad as it is today started Sunday. He says it is right leg and groin area. It runs up his private area and affects his back. He states it is painful to sit.

## 2019-04-06 ENCOUNTER — Ambulatory Visit
Admission: RE | Admit: 2019-04-06 | Discharge: 2019-04-06 | Disposition: A | Discharge status: Home / Self Care | Payer: Medicare Other | Source: Ambulatory Visit | Attending: Internal Medicine | Admitting: Internal Medicine

## 2019-04-06 ENCOUNTER — Other Ambulatory Visit: Payer: Self-pay

## 2019-04-06 DIAGNOSIS — R2243 Localized swelling, mass and lump, lower limb, bilateral: Secondary | ICD-10-CM | POA: Diagnosis not present

## 2019-04-06 DIAGNOSIS — N320 Bladder-neck obstruction: Secondary | ICD-10-CM | POA: Insufficient documentation

## 2019-04-06 DIAGNOSIS — R309 Painful micturition, unspecified: Secondary | ICD-10-CM | POA: Diagnosis present

## 2019-04-30 MED FILL — ABIRATERONE ACETATE 250 MG: 250 | 30 days supply | Qty: 120 | Fill #1

## 2019-04-30 MED FILL — predniSONE 5 MG TABS: 5 | 30 days supply | Qty: 60 | Fill #1

## 2019-05-03 ENCOUNTER — Other Ambulatory Visit: Payer: Self-pay

## 2019-05-03 MED ORDER — OXYCODONE HCL 10 MG PO TABS: 20.0000 mg | ORAL_TABLET | Freq: Four times a day (QID) | ORAL | 0 refills | Status: AC | PRN

## 2019-05-03 MED ORDER — FENTANYL 25 MCG/HR TD PT72: 1.0000 | MEDICATED_PATCH | TRANSDERMAL | 0 refills | Status: DC

## 2019-05-07 ENCOUNTER — Inpatient Hospital Stay: Payer: Medicare Other

## 2019-05-07 ENCOUNTER — Inpatient Hospital Stay (HOSPITAL_BASED_OUTPATIENT_CLINIC_OR_DEPARTMENT_OTHER): Payer: Medicare Other | Admitting: Oncology

## 2019-05-07 ENCOUNTER — Other Ambulatory Visit: Payer: Self-pay | Admitting: *Deleted

## 2019-05-07 ENCOUNTER — Other Ambulatory Visit: Payer: Self-pay

## 2019-05-07 ENCOUNTER — Inpatient Hospital Stay: Payer: Medicare Other | Attending: Oncology

## 2019-05-07 VITALS — BP 138/81 | HR 62 | Temp 97.6°F | Resp 20 | Wt 193.5 lb

## 2019-05-07 DIAGNOSIS — Z5111 Encounter for antineoplastic chemotherapy: Secondary | ICD-10-CM | POA: Diagnosis not present

## 2019-05-07 DIAGNOSIS — C7951 Secondary malignant neoplasm of bone: Secondary | ICD-10-CM

## 2019-05-07 DIAGNOSIS — G8929 Other chronic pain: Secondary | ICD-10-CM | POA: Diagnosis not present

## 2019-05-07 DIAGNOSIS — D631 Anemia in chronic kidney disease: Secondary | ICD-10-CM

## 2019-05-07 DIAGNOSIS — C61 Malignant neoplasm of prostate: Secondary | ICD-10-CM | POA: Diagnosis not present

## 2019-05-07 DIAGNOSIS — Z87891 Personal history of nicotine dependence: Secondary | ICD-10-CM

## 2019-05-07 DIAGNOSIS — Z5181 Encounter for therapeutic drug level monitoring: Secondary | ICD-10-CM

## 2019-05-07 DIAGNOSIS — G893 Neoplasm related pain (acute) (chronic): Secondary | ICD-10-CM

## 2019-05-07 DIAGNOSIS — N189 Chronic kidney disease, unspecified: Secondary | ICD-10-CM

## 2019-05-07 DIAGNOSIS — M7989 Other specified soft tissue disorders: Secondary | ICD-10-CM

## 2019-05-07 DIAGNOSIS — M545 Low back pain: Secondary | ICD-10-CM

## 2019-05-07 LAB — CBC WITH DIFFERENTIAL/PLATELET
Abs Immature Granulocytes: 0.03 10*3/uL (ref 0.00–0.07)
Basophils Absolute: 0 10*3/uL (ref 0.0–0.1)
Basophils Relative: 1 %
Eosinophils Absolute: 0 10*3/uL (ref 0.0–0.5)
Eosinophils Relative: 1 %
HCT: 36.1 % — ABNORMAL LOW (ref 39.0–52.0)
Hemoglobin: 11.1 g/dL — ABNORMAL LOW (ref 13.0–17.0)
Immature Granulocytes: 1 %
Lymphocytes Relative: 11 %
Lymphs Abs: 0.8 10*3/uL (ref 0.7–4.0)
MCH: 29.1 pg (ref 26.0–34.0)
MCHC: 30.7 g/dL (ref 30.0–36.0)
MCV: 94.5 fL (ref 80.0–100.0)
Monocytes Absolute: 0.5 10*3/uL (ref 0.1–1.0)
Monocytes Relative: 8 %
Neutro Abs: 5.3 10*3/uL (ref 1.7–7.7)
Neutrophils Relative %: 78 %
Platelets: 324 10*3/uL (ref 150–400)
RBC: 3.82 MIL/uL — ABNORMAL LOW (ref 4.22–5.81)
RDW: 15.6 % — ABNORMAL HIGH (ref 11.5–15.5)
WBC: 6.7 10*3/uL (ref 4.0–10.5)
nRBC: 0 % (ref 0.0–0.2)

## 2019-05-07 LAB — COMPREHENSIVE METABOLIC PANEL
ALT: 12 U/L (ref 0–44)
AST: 14 U/L — ABNORMAL LOW (ref 15–41)
Albumin: 3.5 g/dL (ref 3.5–5.0)
Alkaline Phosphatase: 90 U/L (ref 38–126)
Anion gap: 11 (ref 5–15)
BUN: 35 mg/dL — ABNORMAL HIGH (ref 8–23)
CO2: 34 mmol/L — ABNORMAL HIGH (ref 22–32)
Calcium: 8.9 mg/dL (ref 8.9–10.3)
Chloride: 96 mmol/L — ABNORMAL LOW (ref 98–111)
Creatinine, Ser: 1.64 mg/dL — ABNORMAL HIGH (ref 0.61–1.24)
GFR calc Af Amer: 44 mL/min — ABNORMAL LOW (ref >60)
GFR calc non Af Amer: 38 mL/min — ABNORMAL LOW (ref >60)
Glucose, Bld: 144 mg/dL — ABNORMAL HIGH (ref 70–99)
Potassium: 4.5 mmol/L (ref 3.5–5.1)
Sodium: 141 mmol/L (ref 135–145)
Total Bilirubin: 0.4 mg/dL (ref 0.3–1.2)
Total Protein: 6.8 g/dL (ref 6.5–8.1)

## 2019-05-07 LAB — PSA: Prostatic Specific Antigen: 0 ng/mL (ref 0.00–4.00)

## 2019-05-07 MED ORDER — LEUPROLIDE ACETATE (3 MONTH) 22.5 MG IM KIT
22.5000 mg | PACK | Freq: Once | INTRAMUSCULAR | Status: AC
  Administered 2019-05-07: 22.5 mg via INTRAMUSCULAR
  Filled 2019-05-07: qty 22.5

## 2019-05-07 NOTE — Progress Notes (Signed)
Hematology/Oncology Consult note [facility]  Telephone:([phone] [phone]  Patient Care Team: [name], MD as PCP - General (Internal Medicine)   Name of the patient: Johnathan Gould  [id number]  [birth year]   Date of visit: 05/07/19  Diagnosis- 1. 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 Anemia of chronic kidney disease  Chief complaint/ Reason for visit-routine follow-up of prostate cancer on Lupron  Heme/Onc history: patient is a84 year old male was recently admitted to the hospital on 10/16/2016 with symptoms of left hip pain and renal failure. He was found to have bilateral hydronephrosis and is status post bilateral nephrostomy tube placement. CT abdomen showed an irregular polypoid mass at the posterior aspect of the bladder highly concerning for bladder carcinoma. Extensive bulky adenopathy throughout the abdomen and pelvis consistent with nodal metastases. Widespread osseous metastatic disease. Bone scan showed multifocal areas of increased activity throughout the pelvis, bilateral femurs, rib cage, right scapula, thoracic cervical and lumbar spine consistent with metastatic disease. Patient was found to have enlarged abnormal prostate as well as an elevated PSA of 469 and was presumptively diagnosed with metastatic prostate cancer. He was started on Firmagon by urology. He was also seen by radiation oncology and started palliative radiation to his left hip on 11/04/2016 and finished 10 doses of palliative Rt.  He is currently on lupron through usevery 3 months  4 core biopsy obtained for tissue diagnosis which showed: prostate adenocarcinoma gleasons 9 and 10 in all 4 cores  zytiga started in March 2018. He is on fosamax for his osteoporosiswhich was stopped due to heartburn  Patient was admitted to the hospital on 02/05/18 with worsening epigastric pain. He was subsequently found to have acute MITwo-vessel coronary  artery disease, with 80% stenosis proximal LAD, occluded mid left circumflex, subtotal occlusion OM1, and patent stent mid RCAand underwent DES placement  Interval history-he continues to have problems with right lower extremity swelling.  He received Lasix injections and is currently on diuretics and follows up with Dr. Rebecka Apley as well as nephrology.  Doppler right lower extremity as well as ultrasound pelvis did not reveal any DVT or adenopathy.  He continues to have chronic low back pain  ECOG PS- 2 Pain scale- 4 Opioid associated constipation- no  Review of systems- Review of Systems  Constitutional: Positive for malaise/fatigue. Negative for chills, fever and weight loss.  HENT: Negative for congestion, ear discharge and nosebleeds.   Eyes: Negative for blurred vision.  Respiratory: Negative for cough, hemoptysis, sputum production, shortness of breath and wheezing.   Cardiovascular: Positive for leg swelling. Negative for chest pain, palpitations, orthopnea and claudication.  Gastrointestinal: Negative for abdominal pain, blood in stool, constipation, diarrhea, heartburn, melena, nausea and vomiting.  Genitourinary: Negative for dysuria, flank pain, frequency, hematuria and urgency.  Musculoskeletal: Positive for back pain. Negative for joint pain and myalgias.  Skin: Negative for rash.  Neurological: Negative for dizziness, tingling, focal weakness, seizures, weakness and headaches.  Endo/Heme/Allergies: Does not bruise/bleed easily.  Psychiatric/Behavioral: Negative for depression and suicidal ideas. The patient does not have insomnia.       No Known Allergies   Past Medical History:  Diagnosis Date  . Arthritis   . CAD (coronary artery disease)   . Hypertension   . Prostate cancer ([place])    metastatic   . RAD (reactive airway disease)      Past Surgical History:  Procedure Laterality Date  . CORONARY ANGIOPLASTY WITH STENT PLACEMENT    .  CORONARY STENT INTERVENTION  N/A 02/07/2018   Procedure: CORONARY STENT INTERVENTION;  Surgeon: Isaias Cowman, MD;  Location: [facility];  Service: Cardiovascular;  Laterality: N/A;  . IR GENERIC HISTORICAL  10/17/2016   IR NEPHROSTOMY PLACEMENT RIGHT 10/17/2016 [facility]  . IR GENERIC HISTORICAL  10/17/2016   IR NEPHROSTOMY PLACEMENT LEFT 10/17/2016 [name], MD [facility]  . IR GENERIC HISTORICAL  12/23/2016   IR NEPHROSTOMY EXCHANGE LEFT 12/23/2016 [facility]  . IR GENERIC HISTORICAL  12/23/2016   IR NEPHROSTOMY EXCHANGE RIGHT 12/23/2016 [facility]  . IR NEPHROSTOMY EXCHANGE LEFT  02/11/2017  . IR NEPHROSTOMY EXCHANGE RIGHT  02/11/2017  . KNEE SURGERY Left 2006   Laparascopy done on left knee, scar tissue taken out  . LEFT HEART CATH AND CORONARY ANGIOGRAPHY N/A 02/07/2018   Procedure: LEFT HEART CATH AND CORONARY ANGIOGRAPHY;  Surgeon: Isaias Cowman, MD;  Location: [facility];  Service: Cardiovascular;  Laterality: N/A;  . nephrostomy tubes Bilateral     Social History   Socioeconomic History  . Marital status: Married    Spouse name: Not on file  . Number of children: Not on file  . Years of education: Not on file  . Highest education level: Not on file  Occupational History  . Not on file  Social Needs  . Financial resource strain: Not on file  . Food insecurity    Worry: Not on file    Inability: Not on file  . Transportation needs    Medical: Not on file    Non-medical: Not on file  Tobacco Use  . Smoking status: Former Smoker    Packs/day: 1.00    Years: 69.50    Pack years: 69.50    Quit date: 11/04/2014    Years since quitting: 4.5  . Smokeless tobacco: Never Used  Substance and Sexual Activity  . Alcohol use: No  . Drug use: No  . Sexual activity: Yes  Lifestyle  . Physical activity    Days per week: Not on file    Minutes per session: Not on file  . Stress: Not on file  Relationships  . Social Herbalist on phone: Not  on file    Gets together: Not on file    Attends religious service: Not on file    Active member of club or organization: Not on file    Attends meetings of clubs or organizations: Not on file    Relationship status: Not on file  . Intimate partner violence    Fear of current or ex partner: Not on file    Emotionally abused: Not on file    Physically abused: Not on file    Forced sexual activity: Not on file  Other Topics Concern  . Not on file  Social History Narrative  . Not on file    Family History  Problem Relation Age of Onset  . Hypertension Other   . Lung cancer Brother      Current Outpatient Medications:  .  abiraterone acetate (ZYTIGA) 250 MG tablet, TAKE 4 TABLETS (1,000 MG TOTAL) BY MOUTH DAILY. TAKE ON AN EMPTY STOMACH 1 HOUR BEFORE OR 2 HOURS AFTER A MEAL, Disp: 120 tablet, Rfl: 2 .  aspirin EC 81 MG tablet, Take 81 mg by mouth daily. , Disp: , Rfl:  .  calcium carbonate (OSCAL) 1500 (600 Ca) MG TABS tablet, Take by mouth daily with breakfast., Disp: , Rfl:  .  calcium gluconate  500 MG tablet, Take 1 tablet by mouth 3 (three) times daily., Disp: , Rfl:  .  Cholecalciferol (VITAMIN D) 50 MCG (2000 UT) tablet, Take 2,000 Units by mouth daily., Disp: , Rfl:  .  clopidogrel (PLAVIX) 75 MG tablet, Take 75 mg by mouth daily., Disp: , Rfl: 11 .  cyanocobalamin 100 MCG tablet, Take 100 mcg by mouth daily., Disp: , Rfl:  .  fentaNYL (DURAGESIC) 25 MCG/HR, Place 1 patch onto the skin every 3 (three) days., Disp: 10 patch, Rfl: 0 .  ferrous sulfate 325 (65 FE) MG tablet, Take 325 mg by mouth daily with breakfast., Disp: , Rfl:  .  Loratadine 10 MG CAPS, daily., Disp: , Rfl:  .  metoprolol (LOPRESSOR) 50 MG tablet, Take 50 mg by mouth 2 (two) times daily. , Disp: , Rfl:  .  mometasone (NASONEX) 50 MCG/ACT nasal spray, , Disp: , Rfl:  .  nitroGLYCERIN (NITROSTAT) 0.4 MG SL tablet, Place 0.4 mg under the tongue every 5 (five) minutes x 3 doses as needed for chest pain., Disp: ,  Rfl:  .  omeprazole (PRILOSEC) 40 MG capsule, Take 40 mg by mouth daily., Disp: , Rfl:  .  ondansetron (ZOFRAN ODT) 4 MG disintegrating tablet, Take 1 tablet (4 mg total) by mouth every 8 (eight) hours as needed for nausea or vomiting., Disp: 40 tablet, Rfl: 2 .  Oxycodone HCl 10 MG TABS, Take 2 tablets (20 mg total) by mouth every 6 (six) hours as needed., Disp: 240 tablet, Rfl: 0 .  Potassium Chloride ER 20 MEQ TBCR, , Disp: , Rfl:  .  predniSONE (DELTASONE) 5 MG tablet, TAKE 1 TABLET (5 MG TOTAL) BY MOUTH 2 TIMES DAILY WITH A MEAL., Disp: 60 tablet, Rfl: 5 .  sennosides-docusate sodium (SENOKOT-S) 8.6-50 MG tablet, Take 2 tablets by mouth 2 (two) times daily. , Disp: , Rfl:  .  tamsulosin (FLOMAX) 0.4 MG CAPS capsule, Take 1 capsule (0.4 mg total) by mouth daily., Disp: 30 capsule, Rfl: 11 .  torsemide (DEMADEX) 20 MG tablet, TAKE 1 TABLET BY MOUTH ONCE DAILY IN THE MORNING AS DIRECTED, Disp: , Rfl: 5 .  Turmeric (QC TUMERIC COMPLEX) 500 MG CAPS, Take by mouth daily., Disp: , Rfl:  .  vitamin C (ASCORBIC ACID) 250 MG tablet, Take 250 mg by mouth daily., Disp: , Rfl:  .  furosemide (LASIX) 20 MG tablet, TAKE 1 TABLET BY MOUTH ONCE DAILY (Patient not taking: Reported on 05/07/2019), Disp: 7 tablet, Rfl: 0 .  gabapentin (NEURONTIN) 100 MG capsule, Take 1 capsule (100 mg total) by mouth at bedtime. (Patient not taking: Reported on 05/07/2019), Disp: 30 capsule, Rfl: 0 .  ondansetron (ZOFRAN) 4 MG tablet, Take 1 tablet (4 mg total) by mouth every 8 (eight) hours as needed for nausea or vomiting. (Patient not taking: Reported on 05/07/2019), Disp: 30 tablet, Rfl: 3 .  pantoprazole (PROTONIX) 40 MG tablet, Take 1 tablet (40 mg total) by mouth 2 (two) times daily. (Patient not taking: Reported on 05/07/2019), Disp: 60 tablet, Rfl: 0 No current facility-administered medications for this visit.   Facility-Administered Medications Ordered in Other Visits:  .  0.9 %  sodium chloride infusion, , Intravenous,  Continuous, Sindy Guadeloupe, MD, Stopped at 04/28/18 1452 .  leuprolide (LUPRON) injection 22.5 mg, 22.5 mg, Intramuscular, Once, Sindy Guadeloupe, MD .  Leuprolide Acetate (6 Month) (LUPRON) injection 45 mg, 45 mg, Intramuscular, Q6 months, Sindy Guadeloupe, MD  Physical exam:  Vitals:  05/07/19 1104  BP: 138/81  Pulse: 62  Resp: 20  Temp: 97.6 F (36.4 C)  Weight: 193 lb 8 oz (87.8 kg)   Physical Exam Constitutional:      Comments: Elderly gentleman in no acute distress  HENT:     Head: Normocephalic and atraumatic.  Eyes:     Pupils: Pupils are equal, round, and reactive to light.  Neck:     Musculoskeletal: Normal range of motion.  Cardiovascular:     Rate and Rhythm: Normal rate and regular rhythm.     Heart sounds: Normal heart sounds.  Pulmonary:     Effort: Pulmonary effort is normal.     Breath sounds: Normal breath sounds.  Abdominal:     General: Bowel sounds are normal.     Palpations: Abdomen is soft.  Musculoskeletal:     Right lower leg: Edema present.  Skin:    General: Skin is warm and dry.  Neurological:     Mental Status: He is alert and oriented to person, place, and time.      CMP Latest Ref Rng & Units 05/07/2019  Glucose 70 - 99 mg/dL 144(H)  BUN 8 - 23 mg/dL 35(H)  Creatinine 0.61 - 1.24 mg/dL 1.64(H)  Sodium 135 - 145 mmol/L 141  Potassium 3.5 - 5.1 mmol/L 4.5  Chloride 98 - 111 mmol/L 96(L)  CO2 22 - 32 mmol/L 34(H)  Calcium 8.9 - 10.3 mg/dL 8.9  Total Protein 6.5 - 8.1 g/dL 6.8  Total Bilirubin 0.3 - 1.2 mg/dL 0.4  Alkaline Phos 38 - 126 U/L 90  AST 15 - 41 U/L 14(L)  ALT 0 - 44 U/L 12   CBC Latest Ref Rng & Units 05/07/2019  WBC 4.0 - 10.5 K/uL 6.7  Hemoglobin 13.0 - 17.0 g/dL 11.1(L)  Hematocrit 39.0 - 52.0 % 36.1(L)  Platelets 150 - 400 K/uL 324     Assessment and plan- Patient is a 84 y.o. male with castrate sensitive metastatic prostate cancer with bone metastases on Lupron and Zytiga here for routine follow-up  1.  Patient has  been on Zytiga for close to 2 years with stable bone metastases.  Last PSA in April was less than 0.01.  PSA from today is pending.  He will continue Zytiga along with prednisone.  He will get Lupron today and I will see him back in 3 months time with CBC with differential, CMP and PSA for his next dose of Lupron I will switch him to a 6 monthly Lupron at that time.  2.  Right lower extremity swelling: Etiology unclear.  He is currently on compressive stockings as well as diuretics.  No evidence of DVT or compressive adenopathy.  He follows up with Dr. Rebecka Apley.  If his swelling gets worse I have asked him to get in touch with [place] and we could consider referring him to vascular surgery at that time.  3.  Patient has chronically low back pain as well as pain in his hip secondary to bone metastases for which she is on fentanyl patch and as needed oxycodone which we will continue  Visit Diagnosis 1. Prostate cancer metastatic to bone ([place])   2. Neoplasm related pain   3. Encounter for monitoring Lupron therapy      Dr. Randa Evens, MD, MPH [facility] [phone] 05/07/2019 11:36 AM

## 2019-05-07 NOTE — Progress Notes (Signed)
Patient's right leg swelling is up to the groin area and is being followed up with his PCP.  His leg pain today is 8-10/10 on pain scale and he has taken his pain medication.

## 2019-05-29 MED FILL — ABIRATERONE ACETATE 250 MG: 250 | 30 days supply | Qty: 120 | Fill #2

## 2019-05-29 MED FILL — predniSONE 5 MG TABS: 5 | 30 days supply | Qty: 60 | Fill #2

## 2019-06-04 ENCOUNTER — Other Ambulatory Visit: Payer: Self-pay | Admitting: *Deleted

## 2019-06-04 MED ORDER — OXYCODONE HCL 10 MG PO TABS: 20.0000 mg | ORAL_TABLET | Freq: Four times a day (QID) | ORAL | 0 refills | Status: DC | PRN

## 2019-06-04 MED ORDER — FENTANYL 25 MCG/HR TD PT72: 1.0000 | MEDICATED_PATCH | TRANSDERMAL | 0 refills | Status: DC

## 2019-06-25 ENCOUNTER — Other Ambulatory Visit: Payer: Self-pay | Admitting: Oncology

## 2019-06-25 DIAGNOSIS — C7951 Secondary malignant neoplasm of bone: Secondary | ICD-10-CM

## 2019-06-25 DIAGNOSIS — C61 Malignant neoplasm of prostate: Secondary | ICD-10-CM

## 2019-06-28 MED FILL — predniSONE 5 MG TABS: 5 | 30 days supply | Qty: 60 | Fill #3

## 2019-06-28 MED FILL — ABIRATERONE ACETATE 250 MG: 250 | 30 days supply | Qty: 120 | Fill #0

## 2019-07-04 ENCOUNTER — Other Ambulatory Visit: Payer: Self-pay | Admitting: *Deleted

## 2019-07-04 NOTE — Telephone Encounter (Signed)
Pt. Needs refill of pain patch and oxycodone. Called pt and let him know I will send to [name] to approve

## 2019-07-05 MED ORDER — OXYCODONE HCL 10 MG PO TABS: 20.0000 mg | ORAL_TABLET | Freq: Four times a day (QID) | ORAL | 0 refills | Status: DC | PRN

## 2019-07-05 MED ORDER — FENTANYL 25 MCG/HR TD PT72: 1.0000 | MEDICATED_PATCH | TRANSDERMAL | 0 refills | Status: DC

## 2019-07-23 ENCOUNTER — Ambulatory Visit (INDEPENDENT_AMBULATORY_CARE_PROVIDER_SITE_OTHER): Payer: Medicare Other | Admitting: Vascular Surgery

## 2019-07-23 ENCOUNTER — Other Ambulatory Visit: Payer: Self-pay

## 2019-07-23 ENCOUNTER — Encounter (INDEPENDENT_AMBULATORY_CARE_PROVIDER_SITE_OTHER): Payer: Self-pay | Admitting: Vascular Surgery

## 2019-07-23 VITALS — BP 163/100 | HR 82 | Resp 16 | Ht 68.0 in | Wt 204.0 lb

## 2019-07-23 DIAGNOSIS — C61 Malignant neoplasm of prostate: Secondary | ICD-10-CM

## 2019-07-23 DIAGNOSIS — I25118 Atherosclerotic heart disease of native coronary artery with other forms of angina pectoris: Secondary | ICD-10-CM | POA: Diagnosis not present

## 2019-07-23 DIAGNOSIS — I890 Lymphedema, not elsewhere classified: Secondary | ICD-10-CM | POA: Diagnosis not present

## 2019-07-23 DIAGNOSIS — I129 Hypertensive chronic kidney disease with stage 1 through stage 4 chronic kidney disease, or unspecified chronic kidney disease: Secondary | ICD-10-CM | POA: Diagnosis not present

## 2019-07-23 DIAGNOSIS — N183 Chronic kidney disease, stage 3 (moderate): Secondary | ICD-10-CM

## 2019-07-23 NOTE — Progress Notes (Signed)
MRN : [id number]  Johnathan Gould is a 84 y.o. ([birth year]) male who presents with chief complaint of  Chief Complaint  Patient presents with  . New Patient (Initial Visit)  .  History of Present Illness:   Patient is seen for evaluation of severe right leg pain and leg swelling. The patient first noticed the swelling remotely. The swelling is associated with pain and discoloration. The pain and swelling worsens with prolonged dependency and improves with elevation. The pain is unrelated to activity.  The patient notes that in the morning the right leg remains very painful and swollen and steadily increases as the day goes on. The patient also notes a steady worsening of the discoloration in the ankle and shin area.   He has a history of metastatic prostate CA although the last scan did not show lymph node involvement.  The patient denies claudication symptoms.  The patient denies symptoms consistent with rest pain.  The patient denies and extensive history of DJD and LS spine disease.  The patient has no had any past angiography, interventions or vascular surgery.  Elevation makes the leg symptoms better, dependency makes them much worse. There is no history of ulcerations. The patient denies any recent changes in medications.  The patient has not been wearing graduated compression.  The patient denies a history of DVT or PE. There is no prior history of phlebitis. There is no history of primary lymphedema.  No history of trauma or groin or pelvic surgery. There is no history of radiation treatment to the groin or pelvis  The patient denies amaurosis fugax or recent TIA symptoms. There are no recent neurological changes noted. The patient denies recent episodes of angina or shortness of breath  Current Meds  Medication Sig  . aspirin EC 81 MG tablet Take 81 mg by mouth daily.   . calcium carbonate (OSCAL) 1500 (600 Ca) MG TABS tablet Take by mouth daily with breakfast.  .  calcium gluconate 500 MG tablet Take 1 tablet by mouth 3 (three) times daily.  . Cholecalciferol (VITAMIN D) 50 MCG (2000 UT) tablet Take 2,000 Units by mouth daily.  . clopidogrel (PLAVIX) 75 MG tablet Take 75 mg by mouth daily.  . cyanocobalamin 100 MCG tablet Take 100 mcg by mouth daily.  . fentaNYL (DURAGESIC) 25 MCG/HR Place 1 patch onto the skin every 3 (three) days.  . ferrous sulfate 325 (65 FE) MG tablet Take 325 mg by mouth daily with breakfast.  . furosemide (LASIX) 20 MG tablet TAKE 1 TABLET BY MOUTH ONCE DAILY  . gabapentin (NEURONTIN) 100 MG capsule Take 1 capsule (100 mg total) by mouth at bedtime.  . Loratadine 10 MG CAPS daily.  . metoprolol (LOPRESSOR) 50 MG tablet Take 50 mg by mouth 2 (two) times daily.   . mometasone (NASONEX) 50 MCG/ACT nasal spray   . nitroGLYCERIN (NITROSTAT) 0.4 MG SL tablet Place 0.4 mg under the tongue every 5 (five) minutes x 3 doses as needed for chest pain.  Marland Kitchen omeprazole (PRILOSEC) 40 MG capsule Take 40 mg by mouth daily.  . ondansetron (ZOFRAN ODT) 4 MG disintegrating tablet Take 1 tablet (4 mg total) by mouth every 8 (eight) hours as needed for nausea or vomiting.  . Oxycodone HCl 10 MG TABS Take 2 tablets (20 mg total) by mouth every 6 (six) hours as needed.  . pantoprazole (PROTONIX) 40 MG tablet Take 1 tablet (40 mg total) by mouth 2 (two) times daily.  . Potassium  Chloride ER 20 MEQ TBCR   . predniSONE (DELTASONE) 5 MG tablet TAKE 1 TABLET (5 MG TOTAL) BY MOUTH 2 TIMES DAILY WITH A MEAL.  Marland Kitchen sennosides-docusate sodium (SENOKOT-S) 8.6-50 MG tablet Take 2 tablets by mouth 2 (two) times daily.   . tamsulosin (FLOMAX) 0.4 MG CAPS capsule Take 1 capsule (0.4 mg total) by mouth daily.  Marland Kitchen torsemide (DEMADEX) 20 MG tablet TAKE 1 TABLET BY MOUTH ONCE DAILY IN THE MORNING AS DIRECTED  . Turmeric (QC TUMERIC COMPLEX) 500 MG CAPS Take by mouth daily.  . vitamin C (ASCORBIC ACID) 250 MG tablet Take 250 mg by mouth daily.  . [DISCONTINUED] ondansetron  (ZOFRAN) 4 MG tablet Take 1 tablet (4 mg total) by mouth every 8 (eight) hours as needed for nausea or vomiting.    Past Medical History:  Diagnosis Date  . Arthritis   . CAD (coronary artery disease)   . Hypertension   . Prostate cancer ([place])    metastatic   . RAD (reactive airway disease)     Past Surgical History:  Procedure Laterality Date  . CORONARY ANGIOPLASTY WITH STENT PLACEMENT    . CORONARY STENT INTERVENTION N/A 02/07/2018   Procedure: CORONARY STENT INTERVENTION;  Surgeon: Isaias Cowman, MD;  Location: [facility];  Service: Cardiovascular;  Laterality: N/A;  . IR GENERIC HISTORICAL  10/17/2016   IR NEPHROSTOMY PLACEMENT RIGHT 10/17/2016 [facility]  . IR GENERIC HISTORICAL  10/17/2016   IR NEPHROSTOMY PLACEMENT LEFT 10/17/2016 [name], MD [facility]  . IR GENERIC HISTORICAL  12/23/2016   IR NEPHROSTOMY EXCHANGE LEFT 12/23/2016 [facility]  . IR GENERIC HISTORICAL  12/23/2016   IR NEPHROSTOMY EXCHANGE RIGHT 12/23/2016 [facility]  . IR NEPHROSTOMY EXCHANGE LEFT  02/11/2017  . IR NEPHROSTOMY EXCHANGE RIGHT  02/11/2017  . KNEE SURGERY Left 2006   Laparascopy done on left knee, scar tissue taken out  . LEFT HEART CATH AND CORONARY ANGIOGRAPHY N/A 02/07/2018   Procedure: LEFT HEART CATH AND CORONARY ANGIOGRAPHY;  Surgeon: Isaias Cowman, MD;  Location: [facility];  Service: Cardiovascular;  Laterality: N/A;  . nephrostomy tubes Bilateral     Social History Social History   Tobacco Use  . Smoking status: Former Smoker    Packs/day: 1.00    Years: 69.50    Pack years: 69.50    Quit date: 11/04/2014    Years since quitting: 4.7  . Smokeless tobacco: Never Used  Substance Use Topics  . Alcohol use: No  . Drug use: No    Family History Family History  Problem Relation Age of Onset  . Hypertension Other   . Lung cancer Brother   No family history of bleeding/clotting disorders, porphyria or autoimmune disease    No Known Allergies   REVIEW OF SYSTEMS (Negative unless checked)  Constitutional: [] Weight loss  [] Fever  [] Chills Cardiac: [] Chest pain   [] Chest pressure   [] Palpitations   [] Shortness of breath when laying flat   [x] Shortness of breath with exertion. Vascular:  [x] Pain in legs with walking   [x] Pain in legs at rest  [] History of DVT   [] Phlebitis   [x] Swelling in legs   [] Varicose veins   [] Non-healing ulcers Pulmonary:   [] Uses home oxygen   [] Productive cough   [] Hemoptysis   [] Wheeze  [] COPD   [] Asthma Neurologic:  [] Dizziness   [] Seizures   [] History of stroke   [] History of TIA  [] Aphasia   [] Vissual changes   [] Weakness or numbness in arm   []   Weakness or numbness in leg Musculoskeletal:   [] Joint swelling   [] Joint pain   [] Low back pain Hematologic:  [] Easy bruising  [] Easy bleeding   [] Hypercoagulable state   [] Anemic Gastrointestinal:  [] Diarrhea   [] Vomiting  [] Gastroesophageal reflux/heartburn   [] Difficulty swallowing. Genitourinary:  [] Chronic kidney disease   [] Difficult urination  [] Frequent urination   [] Blood in urine Skin:  [] Rashes   [] Ulcers  Psychological:  [] History of anxiety   []  History of major depression.  Physical Examination  Vitals:   07/23/19 1330  BP: (!) 163/100  Pulse: 82  Resp: 16  Weight: 204 lb (92.5 kg)  Height: 5\' 8"  (1.727 m)   Body mass index is 31.02 kg/m. Gen: WD/WN, NAD Head: [place]/AT, No temporalis wasting.  Ear/Nose/Throat: Hearing grossly intact, nares w/o erythema or drainage, poor dentition Eyes: PER, EOMI, sclera nonicteric.  Neck: Supple, no masses.  No bruit or JVD.  Pulmonary:  Good air movement, clear to auscultation bilaterally, no use of accessory muscles.  Cardiac: RRR, normal S1, S2, no Murmurs. Vascular: scattered varicosities present bilaterally.  Mild venous stasis changes to the legs bilaterally.  4+ hard pitting edema of the right leg Vessel Right Left  Radial Palpable Palpable  PT Palpable Palpable  DP Palpable  Palpable  Gastrointestinal: soft, non-distended. No guarding/no peritoneal signs.  Musculoskeletal: M/S 5/5 throughout.  No deformity or atrophy.  Neurologic: CN 2-12 intact. Pain and light touch intact in extremities.  Symmetrical.  Speech is fluent. Motor exam as listed above. Psychiatric: Judgment intact, Mood & affect appropriate for pt's clinical situation. Dermatologic: No rashes or ulcers noted.  No changes consistent with cellulitis. Lymph : No Cervical lymphadenopathy, no lichenification or skin changes of chronic lymphedema.  CBC Lab Results  Component Value Date   WBC 6.7 05/07/2019   HGB 11.1 (L) 05/07/2019   HCT 36.1 (L) 05/07/2019   MCV 94.5 05/07/2019   PLT 324 05/07/2019    BMET    Component Value Date/Time   NA 141 05/07/2019 1035   NA 141 02/25/2017 0838   NA 139 11/08/2012 0443   K 4.5 05/07/2019 1035   K 4.3 11/08/2012 0443   CL 96 (L) 05/07/2019 1035   CL 104 11/08/2012 0443   CO2 34 (H) 05/07/2019 1035   CO2 27 11/08/2012 0443   GLUCOSE 144 (H) 05/07/2019 1035   GLUCOSE 104 (H) 11/08/2012 0443   BUN 35 (H) 05/07/2019 1035   BUN 26 02/25/2017 0838   BUN 22 (H) 11/08/2012 0443   CREATININE 1.64 (H) 05/07/2019 1035   CREATININE 0.94 11/08/2012 0443   CALCIUM 8.9 05/07/2019 1035   CALCIUM 8.7 11/08/2012 0443   GFRNONAA 38 (L) 05/07/2019 1035   GFRNONAA >60 11/08/2012 0443   GFRAA 44 (L) 05/07/2019 1035   GFRAA >60 11/08/2012 0443   CrCl cannot be calculated (Patient's most recent lab result is older than the maximum 21 days allowed.).  COAG Lab Results  Component Value Date   INR 0.93 02/06/2018   INR 1.01 10/16/2016    Radiology No results found.   Assessment/Plan 1. Lymphedema Recommend:  No surgery or intervention at this point in time.    I have reviewed my previous discussion with the patient regarding swelling and why it causes symptoms.  Patient will continue wearing graduated compression stockings class 1 (20-30 mmHg) on a daily  basis. The patient will  beginning wearing the stockings first thing in the morning and removing them in the evening. The patient is instructed  specifically not to sleep in the stockings.    In addition, behavioral modification including several periods of elevation of the lower extremities during the day will be continued.  This was reviewed with the patient during the initial visit.  The patient will also continue routine exercise, especially walking on a daily basis as was discussed during the initial visit.    Despite conservative treatments including graduated compression therapy class 1 and behavioral modification including exercise and elevation the patient  has not obtained adequate control of the lymphedema.  The patient still has stage 3 lymphedema and therefore, I believe that a lymph pump should be added to improve the control of the patient's lymphedema.  Additionally, a lymph pump is warranted because it will reduce the risk of cellulitis and ulceration in the future.  Patient should follow-up in six months   - VAS [place] LOWER EXTREMITY VENOUS REFLUX; Future  2. Prostate cancer [facility]) Continue treatment as ordered  Follow up with Oncology  3. Coronary artery disease of native artery of native heart with stable angina pectoris (HCC) Continue cardiac and antihypertensive medications as already ordered and reviewed, no changes at this time.  Continue statin as ordered and reviewed, no changes at this time  Nitrates PRN for chest pain   4. Benign hypertension with CKD (chronic kidney disease) stage III ([place]) Avoid dehydration and nephrotoxic drugs   Hortencia Pilar, MD  07/23/2019 2:02 PM

## 2019-07-26 MED FILL — predniSONE 5 MG TABS: 5 | 30 days supply | Qty: 60 | Fill #4

## 2019-07-26 MED FILL — ABIRATERONE ACETATE 250 MG: 250 | 30 days supply | Qty: 120 | Fill #1

## 2019-08-01 ENCOUNTER — Other Ambulatory Visit: Payer: Self-pay | Admitting: *Deleted

## 2019-08-01 ENCOUNTER — Telehealth: Payer: Self-pay | Admitting: *Deleted

## 2019-08-01 MED ORDER — OXYCODONE HCL 10 MG PO TABS: 20.0000 mg | ORAL_TABLET | Freq: Four times a day (QID) | ORAL | 0 refills | Status: DC | PRN

## 2019-08-01 MED ORDER — FENTANYL 25 MCG/HR TD PT72: 1.0000 | MEDICATED_PATCH | TRANSDERMAL | 0 refills | Status: DC

## 2019-08-01 NOTE — Telephone Encounter (Signed)
Pt called for a refill of his pain meds- patch and oxycodone. I sent in the refill but noticed it was 3 days early and when I called pt back to let hi know I have requested the refill he told me that normally he waits to 3rd of every month but last month the pharmacy was short by 10 pills so therefore it did not last to the exact date. I told him that [name] off today but I would send her a message of what happened.

## 2019-08-06 ENCOUNTER — Encounter: Payer: Self-pay | Admitting: Oncology

## 2019-08-06 ENCOUNTER — Other Ambulatory Visit: Payer: Self-pay

## 2019-08-06 NOTE — Progress Notes (Signed)
Patient stated that he has no concerns at this time.

## 2019-08-07 ENCOUNTER — Inpatient Hospital Stay: Payer: Medicare Other | Attending: Oncology

## 2019-08-07 ENCOUNTER — Other Ambulatory Visit: Payer: Self-pay

## 2019-08-07 ENCOUNTER — Inpatient Hospital Stay: Payer: Medicare Other

## 2019-08-07 ENCOUNTER — Inpatient Hospital Stay (HOSPITAL_BASED_OUTPATIENT_CLINIC_OR_DEPARTMENT_OTHER): Payer: Medicare Other | Admitting: Oncology

## 2019-08-07 ENCOUNTER — Telehealth (INDEPENDENT_AMBULATORY_CARE_PROVIDER_SITE_OTHER): Payer: Self-pay | Admitting: Vascular Surgery

## 2019-08-07 VITALS — BP 136/95 | HR 100 | Temp 99.7°F | Resp 18 | Wt 202.7 lb

## 2019-08-07 DIAGNOSIS — Z5111 Encounter for antineoplastic chemotherapy: Secondary | ICD-10-CM | POA: Insufficient documentation

## 2019-08-07 DIAGNOSIS — C7951 Secondary malignant neoplasm of bone: Secondary | ICD-10-CM | POA: Insufficient documentation

## 2019-08-07 DIAGNOSIS — Z79899 Other long term (current) drug therapy: Secondary | ICD-10-CM | POA: Diagnosis not present

## 2019-08-07 DIAGNOSIS — C61 Malignant neoplasm of prostate: Secondary | ICD-10-CM

## 2019-08-07 DIAGNOSIS — Z5181 Encounter for therapeutic drug level monitoring: Secondary | ICD-10-CM

## 2019-08-07 DIAGNOSIS — D631 Anemia in chronic kidney disease: Secondary | ICD-10-CM

## 2019-08-07 DIAGNOSIS — Z79818 Long term (current) use of other agents affecting estrogen receptors and estrogen levels: Secondary | ICD-10-CM

## 2019-08-07 LAB — COMPREHENSIVE METABOLIC PANEL
ALT: 12 U/L (ref 0–44)
AST: 14 U/L — ABNORMAL LOW (ref 15–41)
Albumin: 3.1 g/dL — ABNORMAL LOW (ref 3.5–5.0)
Alkaline Phosphatase: 89 U/L (ref 38–126)
Anion gap: 8 (ref 5–15)
BUN: 19 mg/dL (ref 8–23)
CO2: 32 mmol/L (ref 22–32)
Calcium: 8.8 mg/dL — ABNORMAL LOW (ref 8.9–10.3)
Chloride: 100 mmol/L (ref 98–111)
Creatinine, Ser: 1.39 mg/dL — ABNORMAL HIGH (ref 0.61–1.24)
GFR calc Af Amer: 54 mL/min — ABNORMAL LOW (ref >60)
GFR calc non Af Amer: 47 mL/min — ABNORMAL LOW (ref >60)
Glucose, Bld: 145 mg/dL — ABNORMAL HIGH (ref 70–99)
Potassium: 4.1 mmol/L (ref 3.5–5.1)
Sodium: 140 mmol/L (ref 135–145)
Total Bilirubin: 0.4 mg/dL (ref 0.3–1.2)
Total Protein: 6.6 g/dL (ref 6.5–8.1)

## 2019-08-07 LAB — CBC WITH DIFFERENTIAL/PLATELET
Abs Immature Granulocytes: 0.03 10*3/uL (ref 0.00–0.07)
Basophils Absolute: 0 10*3/uL (ref 0.0–0.1)
Basophils Relative: 0 %
Eosinophils Absolute: 0 10*3/uL (ref 0.0–0.5)
Eosinophils Relative: 1 %
HCT: 36.1 % — ABNORMAL LOW (ref 39.0–52.0)
Hemoglobin: 11.5 g/dL — ABNORMAL LOW (ref 13.0–17.0)
Immature Granulocytes: 1 %
Lymphocytes Relative: 9 %
Lymphs Abs: 0.6 10*3/uL — ABNORMAL LOW (ref 0.7–4.0)
MCH: 29.8 pg (ref 26.0–34.0)
MCHC: 31.9 g/dL (ref 30.0–36.0)
MCV: 93.5 fL (ref 80.0–100.0)
Monocytes Absolute: 0.5 10*3/uL (ref 0.1–1.0)
Monocytes Relative: 8 %
Neutro Abs: 5.1 10*3/uL (ref 1.7–7.7)
Neutrophils Relative %: 81 %
Platelets: 286 10*3/uL (ref 150–400)
RBC: 3.86 MIL/uL — ABNORMAL LOW (ref 4.22–5.81)
RDW: 13.7 % (ref 11.5–15.5)
WBC: 6.3 10*3/uL (ref 4.0–10.5)
nRBC: 0 % (ref 0.0–0.2)

## 2019-08-07 LAB — PSA: Prostatic Specific Antigen: 0.01 ng/mL (ref 0.00–4.00)

## 2019-08-07 MED ORDER — LEUPROLIDE ACETATE (6 MONTH) 45 MG IM KIT
45.0000 mg | PACK | INTRAMUSCULAR | Status: AC
  Administered 2019-08-07: 45 mg via INTRAMUSCULAR
  Filled 2019-08-07: qty 45

## 2019-08-07 NOTE — Progress Notes (Signed)
Pt has been waiting for lymphedema pump from vascular and still not got it. He has pain all the time and is on pain patch and oxycodone.

## 2019-08-07 NOTE — Telephone Encounter (Signed)
I called back to [phone] and left a message requesting more information. The lymph pump order was sent to [facility]. AS, CMA

## 2019-08-07 NOTE — Telephone Encounter (Signed)
Spoke with Nurse at patient PCP office and she said patient had called there today asking about the lymph pump and that he really needed it. I advised her that the lymph pump order was sent out on 9/30 to [facility] and that they take 3-4 weeks for insurance approval to get back with the patient. I offered her the cooperate number but she declined it and said she would advise patient that we have sent the order. AS, CMA

## 2019-08-07 NOTE — Progress Notes (Signed)
Hematology/Oncology Consult note [facility]  Telephone:([phone] [phone]  Patient Care Team: [name], MD as PCP - General (Internal Medicine)   Name of the patient: Johnathan Gould  LQ:[id number]  12-13-1934   Date of visit: 08/07/19  Diagnosis-  1. 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 Anemia of chronic kidney disease  Chief complaint/ Reason for visit- routine f/u of prostate cancer on zytiga  Heme/Onc history: patient is a84 year old male was recently admitted to the hospital on 10/16/2016 with symptoms of left hip pain and renal failure. He was found to have bilateral hydronephrosis and is status post bilateral nephrostomy tube placement. CT abdomen showed an irregular polypoid mass at the posterior aspect of the bladder highly concerning for bladder carcinoma. Extensive bulky adenopathy throughout the abdomen and pelvis consistent with nodal metastases. Widespread osseous metastatic disease. Bone scan showed multifocal areas of increased activity throughout the pelvis, bilateral femurs, rib cage, right scapula, thoracic cervical and lumbar spine consistent with metastatic disease. Patient was found to have enlarged abnormal prostate as well as an elevated PSA of 469 and was presumptively diagnosed with metastatic prostate cancer. He was started on Firmagon by urology. He was also seen by radiation oncology and started palliative radiation to his left hip on 11/04/2016 and finished 10 doses of palliative Rt.  He is currently on lupron through usevery 3 months  4 core biopsy obtained for tissue diagnosis which showed: prostate adenocarcinoma gleasons 9 and 10 in all 4 cores  zytiga started in March 2018. He is on fosamax for his osteoporosiswhich was stopped due to heartburn  Patient was admitted to the hospital on 02/05/18 with worsening epigastric pain. He was subsequently found to have acute MITwo-vessel coronary artery  disease, with 80% stenosis proximal LAD, occluded mid left circumflex, subtotal occlusion OM1, and patent stent mid RCAand underwent DES placement   Interval history- patient has been having issues with RLE swelling for a while now. He recently saw vascular surgery and will be getting lymphedema pump soon. He has chronic fatigue and low back pain that is stable  ECOG PS- 2 Pain scale- 0   Review of systems- Review of Systems  Constitutional: Positive for malaise/fatigue. Negative for chills, fever and weight loss.  HENT: Negative for congestion, ear discharge and nosebleeds.   Eyes: Negative for blurred vision.  Respiratory: Negative for cough, hemoptysis, sputum production, shortness of breath and wheezing.   Cardiovascular: Positive for leg swelling. Negative for chest pain, palpitations, orthopnea and claudication.  Gastrointestinal: Negative for abdominal pain, blood in stool, constipation, diarrhea, heartburn, melena, nausea and vomiting.  Genitourinary: Negative for dysuria, flank pain, frequency, hematuria and urgency.  Musculoskeletal: Positive for back pain. Negative for joint pain and myalgias.  Skin: Negative for rash.  Neurological: Negative for dizziness, tingling, focal weakness, seizures, weakness and headaches.  Endo/Heme/Allergies: Does not bruise/bleed easily.  Psychiatric/Behavioral: Negative for depression and suicidal ideas. The patient does not have insomnia.       No Known Allergies   Past Medical History:  Diagnosis Date  . Arthritis   . CAD (coronary artery disease)   . Hypertension   . Prostate cancer ([place])    metastatic   . RAD (reactive airway disease)      Past Surgical History:  Procedure Laterality Date  . CORONARY ANGIOPLASTY WITH STENT PLACEMENT    . CORONARY STENT INTERVENTION N/A 02/07/2018   Procedure: CORONARY STENT INTERVENTION;  Surgeon: Isaias Cowman, MD;  Location: [facility]  INVASIVE CV LAB;  Service: Cardiovascular;  Laterality:  N/A;  . IR GENERIC HISTORICAL  10/17/2016   IR NEPHROSTOMY PLACEMENT RIGHT 10/17/2016 [facility]-INTERV RAD  . IR GENERIC HISTORICAL  10/17/2016   IR NEPHROSTOMY PLACEMENT LEFT 10/17/2016 [name], MD [facility]INTERV RAD  . IR GENERIC HISTORICAL  12/23/2016   IR NEPHROSTOMY EXCHANGE LEFT 12/23/2016 [facility]INTER[facility]  . IR GENERIC HISTORICAL  12/23/2016   IR NEPHROSTOMY EXCHANGE RIGHT 12/23/2016 [facility]INTER[facility]  . IR NEPHROSTOMY EXCHANGE LEFT  02/11/2017  . IR NEPHROSTOMY EXCHANGE RIGHT  02/11/2017  . KNEE SURGERY Left 2006   Laparascopy done on left knee, scar tissue taken out  . LEFT HEART CATH AND CORONARY ANGIOGRAPHY N/A 02/07/2018   Procedure: LEFT HEART CATH AND CORONARY ANGIOGRAPHY;  Surgeon: Isaias Cowman, MD;  Location: [place] CV LAB;  Service: Cardiovascular;  Laterality: N/A;  . nephrostomy tubes Bilateral     Social History   Socioeconomic History  . Marital status: Married    Spouse name: Not on file  . Number of children: Not on file  . Years of education: Not on file  . Highest education level: Not on file  Occupational History  . Not on file  Social Needs  . Financial resource strain: Not on file  . Food insecurity    Worry: Not on file    Inability: Not on file  . Transportation needs    Medical: Not on file    Non-medical: Not on file  Tobacco Use  . Smoking status: Former Smoker    Packs/day: 1.00    Years: 69.50    Pack years: 69.50    Quit date: 11/04/2014    Years since quitting: 4.7  . Smokeless tobacco: Never Used  Substance and Sexual Activity  . Alcohol use: No  . Drug use: No  . Sexual activity: Yes  Lifestyle  . Physical activity    Days per week: Not on file    Minutes per session: Not on file  . Stress: Not on file  Relationships  . Social Herbalist on phone: Not on file    Gets together: Not on file    Attends religious service: Not on file    Active member of club or organization: Not on file    Attends meetings of  clubs or organizations: Not on file    Relationship status: Not on file  . Intimate partner violence    Fear of current or ex partner: Not on file    Emotionally abused: Not on file    Physically abused: Not on file    Forced sexual activity: Not on file  Other Topics Concern  . Not on file  Social History Narrative  . Not on file    Family History  Problem Relation Age of Onset  . Hypertension Other   . Lung cancer Brother      Current Outpatient Medications:  .  abiraterone acetate (ZYTIGA) 250 MG tablet, Take 1 tablet by mouth daily., Disp: , Rfl:  .  aspirin EC 81 MG tablet, Take 81 mg by mouth daily. , Disp: , Rfl:  .  calcium carbonate (OSCAL) 1500 (600 Ca) MG TABS tablet, Take by mouth daily with breakfast., Disp: , Rfl:  .  calcium gluconate 500 MG tablet, Take 1 tablet by mouth 3 (three) times daily., Disp: , Rfl:  .  Cholecalciferol (VITAMIN D) 50 MCG (2000 UT) tablet, Take 2,000 Units by mouth daily., Disp: , Rfl:  .  clopidogrel (PLAVIX) 75 MG tablet, Take 75 mg by mouth daily., Disp: , Rfl: 11 .  cyanocobalamin 100 MCG tablet, Take 100 mcg by mouth daily., Disp: , Rfl:  .  fentaNYL (DURAGESIC) 25 MCG/HR, Place 1 patch onto the skin every 3 (three) days., Disp: 10 patch, Rfl: 0 .  ferrous sulfate 325 (65 FE) MG tablet, Take 325 mg by mouth daily with breakfast., Disp: , Rfl:  .  furosemide (LASIX) 20 MG tablet, TAKE 1 TABLET BY MOUTH ONCE DAILY, Disp: 7 tablet, Rfl: 0 .  gabapentin (NEURONTIN) 100 MG capsule, Take 1 capsule (100 mg total) by mouth at bedtime., Disp: 30 capsule, Rfl: 0 .  Loratadine 10 MG CAPS, daily., Disp: , Rfl:  .  metoprolol (LOPRESSOR) 50 MG tablet, Take 50 mg by mouth 2 (two) times daily. , Disp: , Rfl:  .  mometasone (NASONEX) 50 MCG/ACT nasal spray, , Disp: , Rfl:  .  nitroGLYCERIN (NITROSTAT) 0.4 MG SL tablet, Place 0.4 mg under the tongue every 5 (five) minutes x 3 doses as needed for chest pain., Disp: , Rfl:  .  omeprazole (PRILOSEC) 40 MG  capsule, Take 40 mg by mouth daily., Disp: , Rfl:  .  ondansetron (ZOFRAN ODT) 4 MG disintegrating tablet, Take 1 tablet (4 mg total) by mouth every 8 (eight) hours as needed for nausea or vomiting., Disp: 40 tablet, Rfl: 2 .  Oxycodone HCl 10 MG TABS, Take 2 tablets (20 mg total) by mouth every 6 (six) hours as needed., Disp: 240 tablet, Rfl: 0 .  pantoprazole (PROTONIX) 40 MG tablet, Take 1 tablet (40 mg total) by mouth 2 (two) times daily., Disp: 60 tablet, Rfl: 0 .  Potassium Chloride ER 20 MEQ TBCR, , Disp: , Rfl:  .  predniSONE (DELTASONE) 5 MG tablet, TAKE 1 TABLET (5 MG TOTAL) BY MOUTH 2 TIMES DAILY WITH A MEAL., Disp: 60 tablet, Rfl: 5 .  sennosides-docusate sodium (SENOKOT-S) 8.6-50 MG tablet, Take 2 tablets by mouth 2 (two) times daily. , Disp: , Rfl:  .  tamsulosin (FLOMAX) 0.4 MG CAPS capsule, Take 1 capsule (0.4 mg total) by mouth daily., Disp: 30 capsule, Rfl: 11 .  torsemide (DEMADEX) 20 MG tablet, TAKE 1 TABLET BY MOUTH ONCE DAILY IN THE MORNING AS DIRECTED, Disp: , Rfl: 5 .  Turmeric (QC TUMERIC COMPLEX) 500 MG CAPS, Take by mouth daily., Disp: , Rfl:  .  vitamin C (ASCORBIC ACID) 250 MG tablet, Take 250 mg by mouth daily., Disp: , Rfl:  No current facility-administered medications for this visit.   Facility-Administered Medications Ordered in Other Visits:  .  0.9 %  sodium chloride infusion, , Intravenous, Continuous, Sindy Guadeloupe, MD, Stopped at 04/28/18 1452 .  Leuprolide Acetate (6 Month) (LUPRON) injection 45 mg, 45 mg, Intramuscular, Q6 months, Sindy Guadeloupe, MD .  Leuprolide Acetate (6 Month) (LUPRON) injection 45 mg, 45 mg, Intramuscular, Q6 months, Sindy Guadeloupe, MD, 45 mg at 08/07/19 1213  Physical exam:  Vitals:   08/07/19 1106  BP: (!) 136/95  Pulse: 100  Resp: 18  Temp: 99.7 F (37.6 C)  TempSrc: Tympanic  Weight: 202 lb 11.2 oz (91.9 kg)   Physical Exam Constitutional:      General: He is not in acute distress. HENT:     Head: Normocephalic and  atraumatic.  Eyes:     Pupils: Pupils are equal, round, and reactive to light.  Neck:     Musculoskeletal: Normal range of motion.  Cardiovascular:  Rate and Rhythm: Normal rate and regular rhythm.     Heart sounds: Normal heart sounds.  Pulmonary:     Effort: Pulmonary effort is normal.     Breath sounds: Normal breath sounds.  Abdominal:     General: Bowel sounds are normal.     Palpations: Abdomen is soft.  Musculoskeletal:     Comments: Right leg swelling  Skin:    General: Skin is warm and dry.  Neurological:     Mental Status: He is alert and oriented to person, place, and time.      CMP Latest Ref Rng & Units 08/07/2019  Glucose 70 - 99 mg/dL 145(H)  BUN 8 - 23 mg/dL 19  Creatinine 0.61 - 1.24 mg/dL 1.39(H)  Sodium 135 - 145 mmol/L 140  Potassium 3.5 - 5.1 mmol/L 4.1  Chloride 98 - 111 mmol/L 100  CO2 22 - 32 mmol/L 32  Calcium 8.9 - 10.3 mg/dL 8.8(L)  Total Protein 6.5 - 8.1 g/dL 6.6  Total Bilirubin 0.3 - 1.2 mg/dL 0.4  Alkaline Phos 38 - 126 U/L 89  AST 15 - 41 U/L 14(L)  ALT 0 - 44 U/L 12   CBC Latest Ref Rng & Units 08/07/2019  WBC 4.0 - 10.5 K/uL 6.3  Hemoglobin 13.0 - 17.0 g/dL 11.5(L)  Hematocrit 39.0 - 52.0 % 36.1(L)  Platelets 150 - 400 K/uL 286      Assessment and plan- Patient is a 84 y.o. male with castrate sensitive metastatic prostate cancer with bone metastases on Lupron and Zytiga. He is here for routine f/u of prostate cancer  1. PSA from today is pending. Last psa was 0. He is doing well on zytiga with stable labs. He will receive 6 month dose of lupron today  2. RLE swelling- etiology unclear. He will be getting lymphedema pump   I will see him back in 3 months with ccb with diff, cmp and psa   Visit Diagnosis 1. Prostate cancer metastatic to bone ([place])   2. Encounter for monitoring Lupron therapy   3. High risk medication use      Dr. Randa Evens, MD, MPH [facility] [id number] 08/07/2019 12:27  PM

## 2019-08-08 ENCOUNTER — Ambulatory Visit: Payer: Medicare Other | Admitting: Radiation Oncology

## 2019-08-09 ENCOUNTER — Ambulatory Visit: Payer: Medicare Other

## 2019-08-10 ENCOUNTER — Ambulatory Visit: Payer: Medicare Other

## 2019-08-13 ENCOUNTER — Ambulatory Visit: Payer: Medicare Other

## 2019-08-14 ENCOUNTER — Ambulatory Visit: Payer: Medicare Other

## 2019-08-15 ENCOUNTER — Ambulatory Visit: Payer: Medicare Other

## 2019-08-16 ENCOUNTER — Ambulatory Visit: Payer: Medicare Other

## 2019-08-17 ENCOUNTER — Ambulatory Visit: Payer: Medicare Other

## 2019-08-20 ENCOUNTER — Ambulatory Visit: Payer: Medicare Other

## 2019-08-21 ENCOUNTER — Ambulatory Visit: Payer: Medicare Other

## 2019-08-21 ENCOUNTER — Other Ambulatory Visit: Payer: Self-pay | Admitting: Pharmacist

## 2019-08-21 NOTE — Progress Notes (Signed)
Oral Chemotherapy Pharmacist Encounter    Noticed error in historical med entry for abiraterone. Incorrectly entered as 1 tablet daily, corrected it to 4 tablets daily to match most recent escribed prescription.   Darl Pikes, PharmD, BCPS, [facility] Hematology/Oncology Clinical Pharmacist [facility]/AP Oral [facility] [phone]  08/21/2019 4:23 PM

## 2019-08-22 ENCOUNTER — Ambulatory Visit: Payer: Medicare Other

## 2019-08-23 ENCOUNTER — Ambulatory Visit: Payer: Medicare Other

## 2019-08-23 MED FILL — ABIRATERONE ACETATE 250 MG: 250 | 30 days supply | Qty: 120 | Fill #2

## 2019-08-23 MED FILL — predniSONE 5 MG TABS: 5 | 30 days supply | Qty: 60 | Fill #5

## 2019-08-24 ENCOUNTER — Ambulatory Visit: Payer: Medicare Other

## 2019-08-27 ENCOUNTER — Ambulatory Visit: Payer: Medicare Other

## 2019-08-28 ENCOUNTER — Ambulatory Visit: Payer: Medicare Other

## 2019-08-29 ENCOUNTER — Ambulatory Visit: Payer: Medicare Other

## 2019-08-30 ENCOUNTER — Ambulatory Visit: Payer: Medicare Other

## 2019-08-30 ENCOUNTER — Telehealth: Payer: Self-pay | Admitting: *Deleted

## 2019-08-30 ENCOUNTER — Other Ambulatory Visit: Payer: Self-pay | Admitting: *Deleted

## 2019-08-30 NOTE — Telephone Encounter (Signed)
Pt called and left message for refill of pain meds and his lymphedema pump had not been taken care of . I told him that I will send message to [name] for refill of meds and call vascular to see what is going on with pump.

## 2019-08-31 ENCOUNTER — Ambulatory Visit: Payer: Medicare Other

## 2019-08-31 MED ORDER — OXYCODONE HCL 10 MG PO TABS: 20.0000 mg | ORAL_TABLET | Freq: Four times a day (QID) | ORAL | 0 refills | Status: DC | PRN

## 2019-08-31 MED ORDER — FENTANYL 25 MCG/HR TD PT72: 1.0000 | MEDICATED_PATCH | TRANSDERMAL | 0 refills | Status: DC

## 2019-09-03 ENCOUNTER — Ambulatory Visit: Payer: Medicare Other

## 2019-09-04 ENCOUNTER — Ambulatory Visit: Payer: Medicare Other

## 2019-09-10 ENCOUNTER — Emergency Department: Payer: Medicare Other

## 2019-09-10 ENCOUNTER — Inpatient Hospital Stay
Admission: EM | Admit: 2019-09-10 | Discharge: 2019-09-13 | DRG: 690 | Disposition: A | Discharge status: Home / Self Care | Payer: Medicare Other | Attending: Internal Medicine | Admitting: Internal Medicine

## 2019-09-10 ENCOUNTER — Other Ambulatory Visit: Payer: Self-pay

## 2019-09-10 DIAGNOSIS — G893 Neoplasm related pain (acute) (chronic): Secondary | ICD-10-CM | POA: Diagnosis present

## 2019-09-10 DIAGNOSIS — Z7902 Long term (current) use of antithrombotics/antiplatelets: Secondary | ICD-10-CM | POA: Diagnosis not present

## 2019-09-10 DIAGNOSIS — Z8249 Family history of ischemic heart disease and other diseases of the circulatory system: Secondary | ICD-10-CM

## 2019-09-10 DIAGNOSIS — I890 Lymphedema, not elsewhere classified: Secondary | ICD-10-CM | POA: Diagnosis present

## 2019-09-10 DIAGNOSIS — I878 Other specified disorders of veins: Secondary | ICD-10-CM | POA: Diagnosis present

## 2019-09-10 DIAGNOSIS — I130 Hypertensive heart and chronic kidney disease with heart failure and stage 1 through stage 4 chronic kidney disease, or unspecified chronic kidney disease: Secondary | ICD-10-CM | POA: Diagnosis present

## 2019-09-10 DIAGNOSIS — J45909 Unspecified asthma, uncomplicated: Secondary | ICD-10-CM | POA: Diagnosis present

## 2019-09-10 DIAGNOSIS — B9620 Unspecified Escherichia coli [E. coli] as the cause of diseases classified elsewhere: Secondary | ICD-10-CM | POA: Diagnosis present

## 2019-09-10 DIAGNOSIS — I2510 Atherosclerotic heart disease of native coronary artery without angina pectoris: Secondary | ICD-10-CM | POA: Diagnosis present

## 2019-09-10 DIAGNOSIS — R310 Gross hematuria: Secondary | ICD-10-CM | POA: Diagnosis present

## 2019-09-10 DIAGNOSIS — I5032 Chronic diastolic (congestive) heart failure: Secondary | ICD-10-CM | POA: Diagnosis present

## 2019-09-10 DIAGNOSIS — Z955 Presence of coronary angioplasty implant and graft: Secondary | ICD-10-CM | POA: Diagnosis not present

## 2019-09-10 DIAGNOSIS — R5381 Other malaise: Secondary | ICD-10-CM | POA: Diagnosis present

## 2019-09-10 DIAGNOSIS — Z801 Family history of malignant neoplasm of trachea, bronchus and lung: Secondary | ICD-10-CM

## 2019-09-10 DIAGNOSIS — I252 Old myocardial infarction: Secondary | ICD-10-CM | POA: Diagnosis not present

## 2019-09-10 DIAGNOSIS — N12 Tubulo-interstitial nephritis, not specified as acute or chronic: Secondary | ICD-10-CM

## 2019-09-10 DIAGNOSIS — G894 Chronic pain syndrome: Secondary | ICD-10-CM | POA: Diagnosis present

## 2019-09-10 DIAGNOSIS — N179 Acute kidney failure, unspecified: Secondary | ICD-10-CM | POA: Diagnosis present

## 2019-09-10 DIAGNOSIS — C7951 Secondary malignant neoplasm of bone: Secondary | ICD-10-CM | POA: Diagnosis present

## 2019-09-10 DIAGNOSIS — N1330 Unspecified hydronephrosis: Secondary | ICD-10-CM | POA: Diagnosis present

## 2019-09-10 DIAGNOSIS — Z7952 Long term (current) use of systemic steroids: Secondary | ICD-10-CM | POA: Diagnosis not present

## 2019-09-10 DIAGNOSIS — Z79899 Other long term (current) drug therapy: Secondary | ICD-10-CM | POA: Diagnosis not present

## 2019-09-10 DIAGNOSIS — Z20828 Contact with and (suspected) exposure to other viral communicable diseases: Secondary | ICD-10-CM | POA: Diagnosis present

## 2019-09-10 DIAGNOSIS — Z87891 Personal history of nicotine dependence: Secondary | ICD-10-CM

## 2019-09-10 DIAGNOSIS — Z7982 Long term (current) use of aspirin: Secondary | ICD-10-CM | POA: Diagnosis not present

## 2019-09-10 DIAGNOSIS — N136 Pyonephrosis: Secondary | ICD-10-CM | POA: Diagnosis not present

## 2019-09-10 DIAGNOSIS — N1831 Chronic kidney disease, stage 3a: Secondary | ICD-10-CM | POA: Diagnosis present

## 2019-09-10 DIAGNOSIS — Z66 Do not resuscitate: Secondary | ICD-10-CM | POA: Diagnosis present

## 2019-09-10 DIAGNOSIS — C61 Malignant neoplasm of prostate: Secondary | ICD-10-CM | POA: Diagnosis present

## 2019-09-10 LAB — URINALYSIS, COMPLETE (UACMP) WITH MICROSCOPIC
Bacteria, UA: NONE SEEN
Bilirubin Urine: NEGATIVE
Glucose, UA: NEGATIVE mg/dL
Ketones, ur: NEGATIVE mg/dL
Nitrite: POSITIVE — AB
Protein, ur: 100 mg/dL — AB
RBC / HPF: >50 RBC/hpf — ABNORMAL HIGH (ref 0–5)
Specific Gravity, Urine: 1.019 (ref 1.005–1.030)
Squamous Epithelial / HPF: NONE SEEN (ref 0–5)
WBC, UA: >50 WBC/hpf — ABNORMAL HIGH (ref 0–5)
pH: 7 (ref 5.0–8.0)

## 2019-09-10 LAB — CBC WITH DIFFERENTIAL/PLATELET
Abs Immature Granulocytes: 0.07 10*3/uL (ref 0.00–0.07)
Basophils Absolute: 0 10*3/uL (ref 0.0–0.1)
Basophils Relative: 0 %
Eosinophils Absolute: 0 10*3/uL (ref 0.0–0.5)
Eosinophils Relative: 0 %
HCT: 36 % — ABNORMAL LOW (ref 39.0–52.0)
Hemoglobin: 11.3 g/dL — ABNORMAL LOW (ref 13.0–17.0)
Immature Granulocytes: 1 %
Lymphocytes Relative: 11 %
Lymphs Abs: 1.1 10*3/uL (ref 0.7–4.0)
MCH: 29.7 pg (ref 26.0–34.0)
MCHC: 31.4 g/dL (ref 30.0–36.0)
MCV: 94.5 fL (ref 80.0–100.0)
Monocytes Absolute: 0.7 10*3/uL (ref 0.1–1.0)
Monocytes Relative: 7 %
Neutro Abs: 8.2 10*3/uL — ABNORMAL HIGH (ref 1.7–7.7)
Neutrophils Relative %: 81 %
Platelets: 317 10*3/uL (ref 150–400)
RBC: 3.81 MIL/uL — ABNORMAL LOW (ref 4.22–5.81)
RDW: 14 % (ref 11.5–15.5)
WBC: 10.1 10*3/uL (ref 4.0–10.5)
nRBC: 0 % (ref 0.0–0.2)

## 2019-09-10 LAB — COMPREHENSIVE METABOLIC PANEL
ALT: 9 U/L (ref 0–44)
AST: 15 U/L (ref 15–41)
Albumin: 3.3 g/dL — ABNORMAL LOW (ref 3.5–5.0)
Alkaline Phosphatase: 94 U/L (ref 38–126)
Anion gap: 11 (ref 5–15)
BUN: 21 mg/dL (ref 8–23)
CO2: 31 mmol/L (ref 22–32)
Calcium: 8.9 mg/dL (ref 8.9–10.3)
Chloride: 97 mmol/L — ABNORMAL LOW (ref 98–111)
Creatinine, Ser: 1.54 mg/dL — ABNORMAL HIGH (ref 0.61–1.24)
GFR calc Af Amer: 48 mL/min — ABNORMAL LOW (ref >60)
GFR calc non Af Amer: 41 mL/min — ABNORMAL LOW (ref >60)
Glucose, Bld: 107 mg/dL — ABNORMAL HIGH (ref 70–99)
Potassium: 4.3 mmol/L (ref 3.5–5.1)
Sodium: 139 mmol/L (ref 135–145)
Total Bilirubin: 0.5 mg/dL (ref 0.3–1.2)
Total Protein: 7.1 g/dL (ref 6.5–8.1)

## 2019-09-10 LAB — SARS CORONAVIRUS 2 (TAT 6-24 HRS): SARS Coronavirus 2: NEGATIVE

## 2019-09-10 MED ORDER — BISACODYL 10 MG RE SUPP: 10.0000 mg | Freq: Every day | RECTAL | Status: DC | PRN

## 2019-09-10 MED ORDER — ASPIRIN EC 81 MG PO TBEC
81.0000 mg | DELAYED_RELEASE_TABLET | Freq: Every day | ORAL | Status: DC
  Administered 2019-09-10 – 2019-09-13 (×4): 81 mg via ORAL
  Filled 2019-09-10 (×4): qty 1

## 2019-09-10 MED ORDER — HYDROMORPHONE HCL 1 MG/ML IJ SOLN
0.5000 mg | Freq: Once | INTRAMUSCULAR | Status: AC
  Administered 2019-09-10: 0.5 mg via INTRAVENOUS

## 2019-09-10 MED ORDER — ONDANSETRON HCL 4 MG/2ML IJ SOLN
4.0000 mg | Freq: Once | INTRAMUSCULAR | Status: AC
  Administered 2019-09-10: 4 mg via INTRAVENOUS
  Filled 2019-09-10: qty 2

## 2019-09-10 MED ORDER — SODIUM CHLORIDE 0.9 % IV SOLN
2.0000 g | INTRAVENOUS | Status: DC
  Administered 2019-09-11 – 2019-09-12 (×2): 2 g via INTRAVENOUS
  Filled 2019-09-10: qty 2
  Filled 2019-09-10: qty 20

## 2019-09-10 MED ORDER — TORSEMIDE 20 MG PO TABS
20.0000 mg | ORAL_TABLET | Freq: Every day | ORAL | Status: DC
  Administered 2019-09-11 – 2019-09-13 (×3): 20 mg via ORAL
  Filled 2019-09-10 (×3): qty 1

## 2019-09-10 MED ORDER — LACTATED RINGERS IV SOLN: INTRAVENOUS | Status: DC

## 2019-09-10 MED ORDER — PANTOPRAZOLE SODIUM 40 MG PO TBEC
40.0000 mg | DELAYED_RELEASE_TABLET | Freq: Two times a day (BID) | ORAL | Status: DC
  Administered 2019-09-10 – 2019-09-13 (×7): 40 mg via ORAL
  Filled 2019-09-10 (×7): qty 1

## 2019-09-10 MED ORDER — GABAPENTIN 100 MG PO CAPS
100.0000 mg | ORAL_CAPSULE | Freq: Every day | ORAL | Status: DC
  Administered 2019-09-10 – 2019-09-12 (×3): 100 mg via ORAL
  Filled 2019-09-10 (×3): qty 1

## 2019-09-10 MED ORDER — FENTANYL 25 MCG/HR TD PT72
1.0000 | MEDICATED_PATCH | TRANSDERMAL | Status: DC
  Administered 2019-09-11: 1 via TRANSDERMAL
  Filled 2019-09-10 (×3): qty 1

## 2019-09-10 MED ORDER — HYDROMORPHONE HCL 1 MG/ML IJ SOLN
1.0000 mg | INTRAMUSCULAR | Status: DC | PRN
  Administered 2019-09-10: 1 mg via INTRAVENOUS
  Filled 2019-09-10: qty 1

## 2019-09-10 MED ORDER — ACETAMINOPHEN 325 MG PO TABS
650.0000 mg | ORAL_TABLET | Freq: Four times a day (QID) | ORAL | Status: DC | PRN
  Administered 2019-09-10: 650 mg via ORAL
  Filled 2019-09-10 (×2): qty 2

## 2019-09-10 MED ORDER — PREDNISONE 5 MG PO TABS
5.0000 mg | ORAL_TABLET | Freq: Two times a day (BID) | ORAL | Status: DC
  Administered 2019-09-10 – 2019-09-13 (×6): 5 mg via ORAL
  Filled 2019-09-10 (×7): qty 1

## 2019-09-10 MED ORDER — OXYCODONE HCL 5 MG PO TABS
20.0000 mg | ORAL_TABLET | Freq: Four times a day (QID) | ORAL | Status: DC | PRN
  Administered 2019-09-10 – 2019-09-13 (×8): 20 mg via ORAL
  Filled 2019-09-10 (×9): qty 4

## 2019-09-10 MED ORDER — CLOPIDOGREL BISULFATE 75 MG PO TABS
75.0000 mg | ORAL_TABLET | Freq: Every day | ORAL | Status: DC
  Administered 2019-09-11 – 2019-09-13 (×3): 75 mg via ORAL
  Filled 2019-09-10 (×3): qty 1

## 2019-09-10 MED ORDER — VITAMIN C 500 MG PO TABS
250.0000 mg | ORAL_TABLET | Freq: Every day | ORAL | Status: DC
  Administered 2019-09-11 – 2019-09-13 (×3): 250 mg via ORAL
  Filled 2019-09-10 (×3): qty 1

## 2019-09-10 MED ORDER — SODIUM CHLORIDE 0.9 % IV BOLUS
1000.0000 mL | Freq: Once | INTRAVENOUS | Status: AC
  Administered 2019-09-10: 1000 mL via INTRAVENOUS

## 2019-09-10 MED ORDER — ACETAMINOPHEN 650 MG RE SUPP: 650.0000 mg | Freq: Four times a day (QID) | RECTAL | Status: DC | PRN

## 2019-09-10 MED ORDER — HYDROMORPHONE HCL 1 MG/ML IJ SOLN
1.0000 mg | Freq: Once | INTRAMUSCULAR | Status: AC
  Administered 2019-09-10: 1 mg via INTRAVENOUS
  Filled 2019-09-10: qty 1

## 2019-09-10 MED ORDER — SODIUM CHLORIDE 0.9 % IV SOLN
1.0000 g | Freq: Once | INTRAVENOUS | Status: AC
  Administered 2019-09-10: 1 g via INTRAVENOUS
  Filled 2019-09-10: qty 10

## 2019-09-10 MED ORDER — METOPROLOL TARTRATE 50 MG PO TABS
50.0000 mg | ORAL_TABLET | Freq: Two times a day (BID) | ORAL | Status: DC
  Administered 2019-09-10 – 2019-09-13 (×6): 50 mg via ORAL
  Filled 2019-09-10 (×7): qty 1

## 2019-09-10 MED ORDER — SODIUM CHLORIDE 0.9 % IV SOLN: 2.0000 g | INTRAVENOUS | Status: DC

## 2019-09-10 MED ORDER — HYDROMORPHONE HCL 1 MG/ML IJ SOLN
1.0000 mg | Freq: Once | INTRAMUSCULAR | Status: DC
  Filled 2019-09-10: qty 1

## 2019-09-10 MED ORDER — SENNOSIDES-DOCUSATE SODIUM 8.6-50 MG PO TABS
2.0000 | ORAL_TABLET | Freq: Two times a day (BID) | ORAL | Status: DC
  Administered 2019-09-10 – 2019-09-13 (×6): 2 via ORAL
  Filled 2019-09-10 (×7): qty 2

## 2019-09-10 MED ORDER — FLUTICASONE PROPIONATE 50 MCG/ACT NA SUSP
2.0000 | Freq: Every day | NASAL | Status: DC
  Administered 2019-09-11 – 2019-09-13 (×3): 2 via NASAL
  Filled 2019-09-10: qty 16

## 2019-09-10 MED ORDER — ABIRATERONE ACETATE 250 MG PO TABS: 1000.0000 mg | ORAL_TABLET | Freq: Every day | ORAL | Status: DC

## 2019-09-10 MED ORDER — ONDANSETRON HCL 4 MG/2ML IJ SOLN: 4.0000 mg | Freq: Four times a day (QID) | INTRAMUSCULAR | Status: DC | PRN

## 2019-09-10 MED ORDER — PANTOPRAZOLE SODIUM 40 MG PO TBEC: 40.0000 mg | DELAYED_RELEASE_TABLET | Freq: Every day | ORAL | Status: DC

## 2019-09-10 MED ORDER — HYDROMORPHONE HCL 1 MG/ML IJ SOLN
0.5000 mg | INTRAMUSCULAR | Status: DC | PRN
  Administered 2019-09-10: 0.5 mg via INTRAVENOUS
  Filled 2019-09-10 (×2): qty 0.5

## 2019-09-10 MED ORDER — POLYETHYLENE GLYCOL 3350 17 G PO PACK: 17.0000 g | PACK | Freq: Every day | ORAL | Status: DC | PRN

## 2019-09-10 MED ORDER — HEPARIN SODIUM (PORCINE) 5000 UNIT/ML IJ SOLN
5000.0000 [IU] | Freq: Three times a day (TID) | INTRAMUSCULAR | Status: DC
  Administered 2019-09-10 – 2019-09-11 (×3): 5000 [IU] via SUBCUTANEOUS
  Filled 2019-09-10 (×3): qty 1

## 2019-09-10 MED ORDER — FERROUS SULFATE 325 (65 FE) MG PO TABS
325.0000 mg | ORAL_TABLET | Freq: Every day | ORAL | Status: DC
  Administered 2019-09-11 – 2019-09-13 (×3): 325 mg via ORAL
  Filled 2019-09-10 (×4): qty 1

## 2019-09-10 MED ORDER — ONDANSETRON HCL 4 MG PO TABS: 4.0000 mg | ORAL_TABLET | Freq: Four times a day (QID) | ORAL | Status: DC | PRN

## 2019-09-10 MED ORDER — SODIUM CHLORIDE 0.9% FLUSH
3.0000 mL | Freq: Two times a day (BID) | INTRAVENOUS | Status: DC
  Administered 2019-09-10 – 2019-09-13 (×6): 3 mL via INTRAVENOUS

## 2019-09-10 MED ORDER — TAMSULOSIN HCL 0.4 MG PO CAPS
0.4000 mg | ORAL_CAPSULE | Freq: Every day | ORAL | Status: DC
  Administered 2019-09-10 – 2019-09-13 (×4): 0.4 mg via ORAL
  Filled 2019-09-10 (×4): qty 1

## 2019-09-10 NOTE — ED Notes (Signed)
Pt wife stated she was going to go home to rest for a little while. Advised pt's wife that if she left the ER she would not be able to come back but could call me for updates and I would update her to the best of my ability. Gave wife visiting hour info for once he is admitted to the floor and she voices understanding

## 2019-09-10 NOTE — ED Notes (Signed)
Pt given dinner tray.

## 2019-09-10 NOTE — ED Triage Notes (Signed)
Pt c/o urinary retention this morning and states when he does pass urine he is passing blood.

## 2019-09-10 NOTE — Consult Note (Signed)
Urology Consult  I have been asked to see the patient by Dr. Jari Pigg, for evaluation and management of bilateral hydronephrosis.  Chief Complaint: gross hematuria, sensation of incomplete emptying  History of Present Illness: Johnathan Gould is a 84 y.o. year old male who presented to the ED this morning with complaints of urinary retention, gross hematuria, and bilateral flank pain.  PMH significant for metastatic prostate cancer with a history of bilateral hydronephrosis previously managed by Dr. Erlene Quan with bilateral nephrostomy tubes, since removed.  His oncologic care is managed by Dr. [name]; he takes Lupron/Zytiga and prednisone for this.  CT stone study today with bilateral hydronephrosis without obvious obstruction, slightly greater than on March 2019 imaging.   Creatinine stable at 1.54. WBC count 10.1. UA with >50 WBCs/HPF, >50 RBCs/HPF, nitrites, and WBC clumps. Urine culture pending. On antibiotics as below.  Patient reports significant improvement in flank pain since admission. He reports dysuria associated with his current complaint.  Anti-infectives (From admission, onward)   Start     Dose/Rate Route Frequency Ordered Stop   09/11/19 1000  cefTRIAXone (ROCEPHIN) 2 g in sodium chloride 0.9 % 100 mL IVPB     2 g 200 mL/hr over 30 Minutes Intravenous Every 24 hours 09/10/19 1359     09/10/19 1100  cefTRIAXone (ROCEPHIN) 2 g in sodium chloride 0.9 % 100 mL IVPB  Status:  Discontinued     2 g 200 mL/hr over 30 Minutes Intravenous Every 24 hours 09/10/19 1058 09/10/19 1359   09/10/19 1000  cefTRIAXone (ROCEPHIN) 1 g in sodium chloride 0.9 % 100 mL IVPB     1 g 200 mL/hr over 30 Minutes Intravenous  Once 09/10/19 0946 09/10/19 1033     Past Medical History:  Diagnosis Date  . Arthritis   . CAD (coronary artery disease)   . Hypertension   . Prostate cancer ([place])    metastatic   . RAD (reactive airway disease)     Past Surgical History:  Procedure Laterality Date  .  CORONARY ANGIOPLASTY WITH STENT PLACEMENT    . CORONARY STENT INTERVENTION N/A 02/07/2018   Procedure: CORONARY STENT INTERVENTION;  Surgeon: Isaias Cowman, MD;  Location: [place] CV LAB;  Service: Cardiovascular;  Laterality: N/A;  . IR GENERIC HISTORICAL  10/17/2016   IR NEPHROSTOMY PLACEMENT RIGHT 10/17/2016 [facility]INTER[facility] RAD  . IR GENERIC HISTORICAL  10/17/2016   IR NEPHROSTOMY PLACEMENT LEFT 10/17/2016 [name], MD [facility]INTER[facility]  . IR GENERIC HISTORICAL  12/23/2016   IR NEPHROSTOMY EXCHANGE LEFT 12/23/2016 [facility]  . IR GENERIC HISTORICAL  12/23/2016   IR NEPHROSTOMY EXCHANGE RIGHT 12/23/2016 [facility]  . IR NEPHROSTOMY EXCHANGE LEFT  02/11/2017  . IR NEPHROSTOMY EXCHANGE RIGHT  02/11/2017  . KNEE SURGERY Left 2006   Laparascopy done on left knee, scar tissue taken out  . LEFT HEART CATH AND CORONARY ANGIOGRAPHY N/A 02/07/2018   Procedure: LEFT HEART CATH AND CORONARY ANGIOGRAPHY;  Surgeon: Isaias Cowman, MD;  Location: [place] CV LAB;  Service: Cardiovascular;  Laterality: N/A;  . nephrostomy tubes Bilateral     Home Medications:  No outpatient medications have been marked as taking for the 09/10/19 encounter [facility] Encounter).    Allergies: No Known Allergies  Family History  Problem Relation Age of Onset  . Hypertension Other   . Lung cancer Brother     Social History:  reports that he quit smoking about 4 years ago. He has a 69.50 pack-year smoking history. He  has never used smokeless tobacco. He reports that he does not drink alcohol or use drugs.  ROS: A complete review of systems was performed.  All systems are negative except for pertinent findings as noted.  Physical Exam:  Vital signs in last 24 hours: Temp:  [99 F (37.2 C)] 99 F (37.2 C) (11/09 0755) Pulse Rate:  [92-100] 95 (11/09 1400) Resp:  [19] 19 (11/09 0755) BP: (120-186)/(75-110) 133/75 (11/09 1430) SpO2:  [88 %-98 %] 94 % (11/09 1400) Weight:  [86.2 kg]  86.2 kg (11/09 0756) Constitutional:  Alert and oriented, no acute distress, nontoxic appearing HEENT: [place] AT, moist mucus membranes.  Cardiovascular: No clubbing, cyanosis, or edema. Respiratory: Normal respiratory effort Skin: No rashes, bruises or suspicious lesions Neurologic: Grossly intact, no focal deficits, moving all 4 extremities Psychiatric: Normal mood and affect  Laboratory Data:  Recent Labs    09/10/19 0850  WBC 10.1  HGB 11.3*  HCT 36.0*   Recent Labs    09/10/19 0850  NA 139  K 4.3  CL 97*  CO2 31  GLUCOSE 107*  BUN 21  CREATININE 1.54*  CALCIUM 8.9   Urinalysis    Component Value Date/Time   COLORURINE PINK (A) 09/10/2019 0850   APPEARANCEUR TURBID (A) 09/10/2019 0850   LABSPEC 1.019 09/10/2019 0850   PHURINE 7.0 09/10/2019 0850   GLUCOSEU NEGATIVE 09/10/2019 0850   HGBUR MODERATE (A) 09/10/2019 0850   BILIRUBINUR NEGATIVE 09/10/2019 [place] 09/10/2019 0850   PROTEINUR 100 (A) 09/10/2019 0850   NITRITE POSITIVE (A) 09/10/2019 0850   LEUKOCYTESUR MODERATE (A) 09/10/2019 0850   Results for orders placed or performed during the hospital encounter of 02/05/18  MRSA PCR Screening     Status: None   Collection Time: 02/08/18 10:11 AM   Specimen: Nasal Mucosa; Nasopharyngeal  Result Value Ref Range Status   MRSA by PCR NEGATIVE NEGATIVE Final    Comment:        The GeneXpert MRSA Assay (FDA approved for NASAL specimens only), is one component of a comprehensive MRSA colonization surveillance program. It is not intended to diagnose MRSA infection nor to guide or monitor treatment for MRSA infections. Performed at [facility], [address], [place],  [address]     Radiologic Imaging: Ct Renal Stone Study  Result Date: 09/10/2019 CLINICAL DATA:  Right flank pain. EXAM: CT ABDOMEN AND PELVIS WITHOUT CONTRAST TECHNIQUE: Multidetector CT imaging of the abdomen and pelvis was performed following the standard  protocol without IV contrast. COMPARISON:  February 05, 2018. FINDINGS: Lower chest: No acute abnormality. Hepatobiliary: Small gallstone is noted. No biliary dilatation is noted. No abnormality is seen in the liver on these unenhanced images. Pancreas: Unremarkable. No pancreatic ductal dilatation or surrounding inflammatory changes. Spleen: Normal in size without focal abnormality. Adrenals/Urinary Tract: Adrenal glands appear normal. Moderate bilateral hydroureteronephrosis is noted without obstructing calculus. Urinary bladder is decompressed. Stomach/Bowel: Stomach is within normal limits. Appendix appears normal. No evidence of bowel wall thickening, distention, or inflammatory changes. Vascular/Lymphatic: Aortic atherosclerosis. No enlarged abdominal or pelvic lymph nodes. Reproductive: Prostate is unremarkable. Other: No abdominal wall hernia or abnormality. No abdominopelvic ascites. Musculoskeletal: Multiple sclerotic lesions are noted throughout the spine in pelvis consistent with osseous metastases. IMPRESSION: Moderate bilateral hydroureteronephrosis is noted without obstructing calculus. Urinary bladder is decompressed. Small gallstone. Diffuse osseous metastases. Aortic Atherosclerosis (ICD10-I70.0). Electronically Signed   By: Marijo Conception M.D.   On: 09/10/2019 08:39   I personally reviewed  the imaging above and note bilateral hydronephrosis.  Assessment & Plan:  84 year old male with PMH metastatic prostate cancer here with pyelonephritis, on antibiotics.  History of bilateral hydronephrosis, previously managed by bilateral nephrostomy tubes.  Creatinine stable.  Hydronephrosis slightly increased over prior imaging, however stable creatinine is reassuring for acute change.  Recommend total 14 days of culture-appropriate antibiotics for treatment of pyelonephritis with follow-up renal ultrasound to evaluate for hydronephrosis following treatment.  Thank you for involving me in this patient's  care, please page with any further questions or concerns.  Debroah Loop, PA-C 09/10/2019 2:52 PM

## 2019-09-10 NOTE — ED Notes (Signed)
Pt c/o 10/10 pain despite receiving dilaudid, Dr. Posey Pronto informed and is ok with giving ordered oxycodone. Pt O2 saturation ranging from 88-92% on RA, placed on 2L [place]

## 2019-09-10 NOTE — H&P (Addendum)
[facility] History and Physical   Patient: Johnathan Gould [id number]   PCP: [name], MD DOB: May 22, 1935   DOA: 09/10/2019   DOS: 09/10/2019   DOS: the patient was seen and examined on 09/10/2019  Patient coming from: The patient is coming from Home  Chief Complaint: Abdominal pain and inability to urinate  HPI: Johnathan Gould is a 84 y.o. male with Past medical history of CAD, HTN, prostate cancer on oral chemotherapy. Patient presents with complaints of abdominal discomfort in the suprapubic region. He mentions that this has been ongoing for last several days. The pain started radiating to his penis which was severe and constant. Patient does have oxycodone as well as fentanyl patch at home but his pain was not controlled with that. Patient mentions that when he is try to urinate the pain acutely worsens. He denies any vomiting but does have some nausea. He also reports some constipation but last bowel movement was yesterday. No abdominal pain elsewhere. No fever no chills no chest pain no shortness of breath no cough no runny nose. Patient follows up with oncology as well as urology outpatient is SP bilateral PCN placement in the past which is currently removed.  ED Course: Presented with above complaint.  Urology was consulted.  Patient was referred for pain control.  At his baseline ambulates without assistance independent for most of his ADL;  manages his medication on his own.  Review of Systems: as mentioned in the history of present illness.  All other systems reviewed and are negative.  Past Medical History:  Diagnosis Date  . Arthritis   . CAD (coronary artery disease)   . Hypertension   . Prostate cancer ([place])    metastatic   . RAD (reactive airway disease)    Past Surgical History:  Procedure Laterality Date  . CORONARY ANGIOPLASTY WITH STENT PLACEMENT    . CORONARY STENT INTERVENTION N/A 02/07/2018   Procedure: CORONARY STENT INTERVENTION;   Surgeon: Isaias Cowman, MD;  Location: [facility];  Service: Cardiovascular;  Laterality: N/A;  . IR GENERIC HISTORICAL  10/17/2016   IR NEPHROSTOMY PLACEMENT RIGHT 10/17/2016 [facility]  . IR GENERIC HISTORICAL  10/17/2016   IR NEPHROSTOMY PLACEMENT LEFT 10/17/2016 [name], MD [facility]  . IR GENERIC HISTORICAL  12/23/2016   IR NEPHROSTOMY EXCHANGE LEFT 12/23/2016 [facility]  . IR GENERIC HISTORICAL  12/23/2016   IR NEPHROSTOMY EXCHANGE RIGHT 12/23/2016 [facility]  . IR NEPHROSTOMY EXCHANGE LEFT  02/11/2017  . IR NEPHROSTOMY EXCHANGE RIGHT  02/11/2017  . KNEE SURGERY Left 2006   Laparascopy done on left knee, scar tissue taken out  . LEFT HEART CATH AND CORONARY ANGIOGRAPHY N/A 02/07/2018   Procedure: LEFT HEART CATH AND CORONARY ANGIOGRAPHY;  Surgeon: Isaias Cowman, MD;  Location: [facility];  Service: Cardiovascular;  Laterality: N/A;  . nephrostomy tubes Bilateral    Social History:  reports that he quit smoking about 4 years ago. He has a 69.50 pack-year smoking history. He has never used smokeless tobacco. He reports that he does not drink alcohol or use drugs.  No Known Allergies  Family history reviewed and not pertinent Family History  Problem Relation Age of Onset  . Hypertension Other   . Lung cancer Brother      Prior to Admission medications   Medication Sig Start Date End Date Taking? Authorizing Provider  abiraterone acetate (ZYTIGA) 250 MG tablet Take 4 tablets by mouth daily. Take on empty stomach.  1 hour before food or 2 hours after.   Yes Sindy Guadeloupe, MD  aspirin EC 81 MG tablet Take 81 mg by mouth daily.    Yes [provider]  calcium carbonate (OSCAL) 1500 (600 Ca) MG TABS tablet Take by mouth daily with breakfast.   Yes [provider]  calcium gluconate 500 MG tablet Take 1 tablet by mouth 3 (three) times daily.   Yes [provider]  Cholecalciferol (VITAMIN D) 50 MCG (2000  UT) tablet Take 2,000 Units by mouth daily.   Yes [provider]  clopidogrel (PLAVIX) 75 MG tablet Take 75 mg by mouth daily. 03/24/18  Yes [provider]  cyanocobalamin 100 MCG tablet Take 100 mcg by mouth daily.   Yes [provider]  fentaNYL (DURAGESIC) 25 MCG/HR Place 1 patch onto the skin every 3 (three) days. 08/31/19  Yes Sindy Guadeloupe, MD  ferrous sulfate 325 (65 FE) MG tablet Take 325 mg by mouth daily with breakfast.   Yes [provider]  furosemide (LASIX) 20 MG tablet TAKE 1 TABLET BY MOUTH ONCE DAILY 05/22/18  Yes Sindy Guadeloupe, MD  gabapentin (NEURONTIN) 100 MG capsule Take 1 capsule (100 mg total) by mouth at bedtime. 02/10/18  Yes Salary, Avel Peace, MD  loratadine (CLARITIN) 10 MG tablet Take 10 mg by mouth daily.  02/28/19  Yes [provider]  metoprolol (LOPRESSOR) 50 MG tablet Take 50 mg by mouth 2 (two) times daily.  12/13/16  Yes [provider]  mometasone (NASONEX) 50 MCG/ACT nasal spray  04/30/19  Yes [provider]  omeprazole (PRILOSEC) 40 MG capsule Take 40 mg by mouth daily. 12/02/18  Yes [provider]  Oxycodone HCl 10 MG TABS Take 2 tablets (20 mg total) by mouth every 6 (six) hours as needed. 08/31/19  Yes Sindy Guadeloupe, MD  pantoprazole (PROTONIX) 40 MG tablet Take 1 tablet (40 mg total) by mouth 2 (two) times daily. 02/10/18  Yes Salary, Avel Peace, MD  Potassium Chloride ER 20 MEQ TBCR Take 20 mEq by mouth daily.  04/13/19  Yes [provider]  predniSONE (DELTASONE) 5 MG tablet TAKE 1 TABLET (5 MG TOTAL) BY MOUTH 2 TIMES DAILY WITH A MEAL. 03/28/19  Yes Sindy Guadeloupe, MD  sennosides-docusate sodium (SENOKOT-S) 8.6-50 MG tablet Take 2 tablets by mouth 2 (two) times daily.    Yes [provider]  tamsulosin (FLOMAX) 0.4 MG CAPS capsule Take 1 capsule (0.4 mg total) by mouth daily. 09/30/17  Yes Hollice Espy, MD  torsemide (DEMADEX) 20 MG tablet TAKE 1 TABLET BY MOUTH ONCE  DAILY IN THE MORNING AS DIRECTED 07/13/18  Yes [provider]  Turmeric (QC TUMERIC COMPLEX) 500 MG CAPS Take by mouth daily.   Yes [provider]  vitamin C (ASCORBIC ACID) 250 MG tablet Take 250 mg by mouth daily.   Yes [provider]  nitroGLYCERIN (NITROSTAT) 0.4 MG SL tablet Place 0.4 mg under the tongue every 5 (five) minutes x 3 doses as needed for chest pain. 06/23/16   [provider]  ondansetron (ZOFRAN ODT) 4 MG disintegrating tablet Take 1 tablet (4 mg total) by mouth every 8 (eight) hours as needed for nausea or vomiting. 10/06/18   Sindy Guadeloupe, MD   Physical Exam: Vitals:   09/10/19 1615 09/10/19 1630 09/10/19 1700 09/10/19 1715  BP:  132/74 128/79   Pulse: 94  97 94  Resp:      Temp:  TempSrc:      SpO2: 96%  95% 96%  Weight:      Height:        General: alert and oriented to time, place, and person. Appear in moderate distress, affect appropriate Eyes: PERRL, Conjunctiva normal ENT: Oral Mucosa Clear, moist  Neck: NO JVD, NO Abnormal Mass Or lumps Cardiovascular: S1 and S2 Present, no Murmur, peripheral pulses symmetrical Respiratory: good respiratory effort, Bilateral Air entry equal and Decreased, no signs of accessory muscle use, Clear to Auscultation, no Crackles, no wheezes Abdomen: Bowel Sound present, Soft and, distended, right flank and suprapubic tenderness, no hernia Skin: Some erythema of the right leg which is chronic per patient but progressively worsening Extremities: bilateral right more than left pedal edema, no calf tenderness Neurologic: without any new focal findings Gait not checked due to patient safety concerns  Data Reviewed: I have personally reviewed and interpreted labs, imaging as discussed below.  CBC: Recent Labs  Lab 09/10/19 0850  WBC 10.1  NEUTROABS 8.2*  HGB 11.3*  HCT 36.0*  MCV 94.5  PLT [id number]   Basic Metabolic Panel: Recent Labs  Lab 09/10/19 0850  NA 139  K 4.3  CL 97*   CO2 31  GLUCOSE 107*  BUN 21  CREATININE 1.54*  CALCIUM 8.9   GFR: Estimated Creatinine Clearance: 39.5 mL/min (A) (by C-G formula based on SCr of 1.54 mg/dL (H)). Liver Function Tests: Recent Labs  Lab 09/10/19 0850  AST 15  ALT 9  ALKPHOS 94  BILITOT 0.5  PROT 7.1  ALBUMIN 3.3*   No results for input(s): LIPASE, AMYLASE in the last 168 hours. No results for input(s): AMMONIA in the last 168 hours. Coagulation Profile: No results for input(s): INR, PROTIME in the last 168 hours. Cardiac Enzymes: No results for input(s): CKTOTAL, CKMB, CKMBINDEX, TROPONINI in the last 168 hours. BNP (last 3 results) No results for input(s): PROBNP in the last 8760 hours. HbA1C: No results for input(s): HGBA1C in the last 72 hours. CBG: No results for input(s): GLUCAP in the last 168 hours. Lipid Profile: No results for input(s): CHOL, HDL, LDLCALC, TRIG, CHOLHDL, LDLDIRECT in the last 72 hours. Thyroid Function Tests: No results for input(s): TSH, T4TOTAL, FREET4, T3FREE, THYROIDAB in the last 72 hours. Anemia Panel: No results for input(s): VITAMINB12, FOLATE, FERRITIN, TIBC, IRON, RETICCTPCT in the last 72 hours. Urine analysis:    Component Value Date/Time   COLORURINE PINK (A) 09/10/2019 0850   APPEARANCEUR TURBID (A) 09/10/2019 0850   LABSPEC 1.019 09/10/2019 0850   PHURINE 7.0 09/10/2019 0850   GLUCOSEU NEGATIVE 09/10/2019 0850   HGBUR MODERATE (A) 09/10/2019 0850   BILIRUBINUR NEGATIVE 09/10/2019 0850   KETONESUR NEGATIVE 09/10/2019 0850   PROTEINUR 100 (A) 09/10/2019 0850   NITRITE POSITIVE (A) 09/10/2019 0850   LEUKOCYTESUR MODERATE (A) 09/10/2019 0850    Radiological Exams on Admission: Ct Renal Stone Study  Result Date: 09/10/2019 CLINICAL DATA:  Right flank pain. EXAM: CT ABDOMEN AND PELVIS WITHOUT CONTRAST TECHNIQUE: Multidetector CT imaging of the abdomen and pelvis was performed following the standard protocol without IV contrast. COMPARISON:  February 05, 2018.  FINDINGS: Lower chest: No acute abnormality. Hepatobiliary: Small gallstone is noted. No biliary dilatation is noted. No abnormality is seen in the liver on these unenhanced images. Pancreas: Unremarkable. No pancreatic ductal dilatation or surrounding inflammatory changes. Spleen: Normal in size without focal abnormality. Adrenals/Urinary Tract: Adrenal glands appear normal. Moderate bilateral hydroureteronephrosis is noted without obstructing calculus. Urinary bladder is decompressed.  Stomach/Bowel: Stomach is within normal limits. Appendix appears normal. No evidence of bowel wall thickening, distention, or inflammatory changes. Vascular/Lymphatic: Aortic atherosclerosis. No enlarged abdominal or pelvic lymph nodes. Reproductive: Prostate is unremarkable. Other: No abdominal wall hernia or abnormality. No abdominopelvic ascites. Musculoskeletal: Multiple sclerotic lesions are noted throughout the spine in pelvis consistent with osseous metastases. IMPRESSION: Moderate bilateral hydroureteronephrosis is noted without obstructing calculus. Urinary bladder is decompressed. Small gallstone. Diffuse osseous metastases. Aortic Atherosclerosis (ICD10-I70.0). Electronically Signed   By: Marijo Conception M.D.   On: 09/10/2019 08:39    I reviewed all nursing notes, pharmacy notes, vitals, pertinent old records.  Assessment/Plan 1.  Bilateral hydronephrosis. Prostate cancer. Chronic kidney disease stage IIIa. UTI,?  Pyelonephritis Presents with complaints of abdominal pain as well as penile pain. CT renal stone protocol shows evidence of bilateral hydronephrosis which is worse than before. Urology was consulted who thinks that the patient should be managed medically without any intervention as the patient does not have any AKI. He also wanted to treat with IV antibiotics.  Follow-up on urine cultures. Currently signed off. Control pain medication with IV Dilaudid as well as home regimen.  2.  Pain  control Patient has chronic pain syndrome. Patient is on fentanyl patch, gabapentin, oxycodone which I will continue. Add IV Dilaudid for severe pain control for now.  3.  Bilateral lymphedema. Signs of chronic venous stasis on the right leg. Monitor for any worsening. Currently on IV ceftriaxone. Initially on gentle IV hydration.  Resume lasix.   4.  CAD. DVT prevention continue aspirin and Plavix.  5.  HFpEF. Patient is on torsemide at home. Resume.  6.  Prostate cancer. Outpatient follow-up with urology.  Nutrition: Carb modified diet DVT Prophylaxis: Subcutaneous Heparin   Advance goals of care discussion: DNR   Consults: urology   Family Communication: family was present at bedside, at the time of interview.  Opportunity was given to ask question and all questions were answered satisfactorily.  Disposition: Admitted as inpatient, med surge unit. Likely to be discharged home, in 2 days.  I have discussed plan of care as described above with RN and patient/family.  Author: Berle Mull, MD [facility] 09/10/2019 7:15 PM   To reach On-call, see care teams to locate the attending and reach out to them via [email or web address]. If 7PM-7AM, please contact night-coverage If you still have difficulty reaching the attending provider, please page the [facility] (Director on Call) for [facility] on amion for assistance.

## 2019-09-10 NOTE — ED Notes (Signed)
Report given to floor RN

## 2019-09-10 NOTE — ED Notes (Signed)
Pt c/o 10/10 pain in lower back and penile area

## 2019-09-10 NOTE — ED Provider Notes (Addendum)
[facility] Emergency Department Provider Note  ____________________________________________   First MD Initiated Contact with Patient 09/10/19 [phone]     (approximate)  I have reviewed the triage vital signs and the nursing notes.   HISTORY  Chief Complaint Urinary Retention    HPI Johnathan Gould is a 84 y.o. male with hypertension, prostate cancer on oral chemotherapy who presents with urinary discomfort.  Patient says he has been passing blood in his urine.  The pain is in his penis, severe, constant, not better with daily oxycodone, nothing makes it worse.  He says that the pain started today.  Does have a little bit of right-sided back pain.  Denies prior kidney stones.  The pain is mostly located over his penis.  Patient had admission in 2017 with renal failure and had bilateral nephrostomy tubes placed.  At that time was when he was diagnosed with metastatic prostate cancer.  Patient is a 45 he had two-vessel coronary disease.          Past Medical History:  Diagnosis Date  . Arthritis   . CAD (coronary artery disease)   . Hypertension   . Prostate cancer ([place])    metastatic   . RAD (reactive airway disease)     Patient Active Problem List   Diagnosis Date Noted  . Lymphedema 07/23/2019  . Palliative care by specialist   . NSTEMI (non-ST elevated myocardial infarction) ([place]) 02/06/2018  . Epigastric pain 02/05/2018  . Goals of care, counseling/discussion 12/10/2016  . Anemia of chronic renal failure 11/22/2016  . Hypotension 11/20/2016  . Dehydration 11/20/2016  . Urinary retention 11/20/2016  . Prostate cancer metastatic to bone ([place]) 11/20/2016  . Prostate cancer ([place])   . Urinary obstruction 10/16/2016  . Acute renal failure (ARF) ([place]) 10/16/2016  . Bladder mass 10/16/2016  . CAD (coronary artery disease) 06/24/2014  . Benign hypertension with CKD (chronic kidney disease) stage III ([place]) 06/24/2014  . RAD (reactive airway  disease) 06/24/2014    Past Surgical History:  Procedure Laterality Date  . CORONARY ANGIOPLASTY WITH STENT PLACEMENT    . CORONARY STENT INTERVENTION N/A 02/07/2018   Procedure: CORONARY STENT INTERVENTION;  Surgeon: Isaias Cowman, MD;  Location: [place] [facility];  Service: Cardiovascular;  Laterality: N/A;  . IR GENERIC HISTORICAL  10/17/2016   IR NEPHROSTOMY PLACEMENT RIGHT 10/17/2016 [facility]INTER[facility] RAD  . IR GENERIC HISTORICAL  10/17/2016   IR NEPHROSTOMY PLACEMENT LEFT 10/17/2016 [name], MD [facility]  . IR GENERIC HISTORICAL  12/23/2016   IR NEPHROSTOMY EXCHANGE LEFT 12/23/2016 [facility]  . IR GENERIC HISTORICAL  12/23/2016   IR NEPHROSTOMY EXCHANGE RIGHT 12/23/2016 [facility]  . IR NEPHROSTOMY EXCHANGE LEFT  02/11/2017  . IR NEPHROSTOMY EXCHANGE RIGHT  02/11/2017  . KNEE SURGERY Left 2006   Laparascopy done on left knee, scar tissue taken out  . LEFT HEART CATH AND CORONARY ANGIOGRAPHY N/A 02/07/2018   Procedure: LEFT HEART CATH AND CORONARY ANGIOGRAPHY;  Surgeon: Isaias Cowman, MD;  Location: [place] [facility];  Service: Cardiovascular;  Laterality: N/A;  . nephrostomy tubes Bilateral     Prior to Admission medications   Medication Sig Start Date End Date Taking? Authorizing Provider  abiraterone acetate (ZYTIGA) 250 MG tablet Take 4 tablets by mouth daily. Take on empty stomach. 1 hour before food or 2 hours after.    Sindy Guadeloupe, MD  aspirin EC 81 MG tablet Take 81 mg by mouth daily.     [provider]  calcium carbonate (OSCAL) 1500 (600 Ca) MG TABS tablet Take by mouth daily with breakfast.    [provider]  calcium gluconate 500 MG tablet Take 1 tablet by mouth 3 (three) times daily.    [provider]  Cholecalciferol (VITAMIN D) 50 MCG (2000 UT) tablet Take 2,000 Units by mouth daily.    [provider]  clopidogrel (PLAVIX) 75 MG tablet Take 75 mg by mouth daily. 03/24/18   [provider]  cyanocobalamin 100 MCG tablet Take 100 mcg by mouth daily.    [provider]  fentaNYL (DURAGESIC) 25 MCG/HR Place 1 patch onto the skin every 3 (three) days. 08/31/19   Sindy Guadeloupe, MD  ferrous sulfate 325 (65 FE) MG tablet Take 325 mg by mouth daily with breakfast.    [provider]  furosemide (LASIX) 20 MG tablet TAKE 1 TABLET BY MOUTH ONCE DAILY 05/22/18   Sindy Guadeloupe, MD  gabapentin (NEURONTIN) 100 MG capsule Take 1 capsule (100 mg total) by mouth at bedtime. 02/10/18   Salary, Avel Peace, MD  Loratadine 10 MG CAPS daily. 02/28/19   [provider]  metoprolol (LOPRESSOR) 50 MG tablet Take 50 mg by mouth 2 (two) times daily.  12/13/16   [provider]  mometasone (NASONEX) 50 MCG/ACT nasal spray  04/30/19   [provider]  nitroGLYCERIN (NITROSTAT) 0.4 MG SL tablet Place 0.4 mg under the tongue every 5 (five) minutes x 3 doses as needed for chest pain. 06/23/16   [provider]  omeprazole (PRILOSEC) 40 MG capsule Take 40 mg by mouth daily. 12/02/18   [provider]  ondansetron (ZOFRAN ODT) 4 MG disintegrating tablet Take 1 tablet (4 mg total) by mouth every 8 (eight) hours as needed for nausea or vomiting. 10/06/18   Sindy Guadeloupe, MD  Oxycodone HCl 10 MG TABS Take 2 tablets (20 mg total) by mouth every 6 (six) hours as needed. 08/31/19   Sindy Guadeloupe, MD  pantoprazole (PROTONIX) 40 MG tablet Take 1 tablet (40 mg total) by mouth 2 (two) times daily. 02/10/18   Salary, Holly Bodily D, MD  Potassium Chloride ER 20 MEQ TBCR  04/13/19   [provider]  predniSONE (DELTASONE) 5 MG tablet TAKE 1 TABLET (5 MG TOTAL) BY MOUTH 2 TIMES DAILY WITH A MEAL. 03/28/19   Sindy Guadeloupe, MD  sennosides-docusate sodium (SENOKOT-S) 8.6-50 MG tablet Take 2 tablets by mouth 2 (two) times daily.     [provider]  tamsulosin (FLOMAX) 0.4 MG CAPS capsule Take 1 capsule (0.4 mg total) by mouth daily. 09/30/17   Hollice Espy, MD   torsemide (DEMADEX) 20 MG tablet TAKE 1 TABLET BY MOUTH ONCE DAILY IN THE MORNING AS DIRECTED 07/13/18   [provider]  Turmeric (QC TUMERIC COMPLEX) 500 MG CAPS Take by mouth daily.    [provider]  vitamin C (ASCORBIC ACID) 250 MG tablet Take 250 mg by mouth daily.    [provider]    Allergies Patient has no known allergies.  Family History  Problem Relation Age of Onset  . Hypertension Other   . Lung cancer Brother     Social History Social History   Tobacco Use  . Smoking status: Former Smoker    Packs/day: 1.00    Years: 69.50    Pack years: 69.50    Quit date: 11/04/2014    Years since quitting: 4.8  . Smokeless tobacco: Never  Used  Substance Use Topics  . Alcohol use: No  . Drug use: No      Review of Systems Constitutional: No fever/chills Eyes: No visual changes. ENT: No sore throat. Cardiovascular: Denies chest pain. Respiratory: Denies shortness of breath. Gastrointestinal: No abdominal pain.  No nausea, no vomiting.  No diarrhea.  No constipation. Genitourinary: Penile pain. Musculoskeletal: Negative for back pain.  Right flank pain Skin: Negative for rash. Neurological: Negative for headaches, focal weakness or numbness. All other ROS negative ____________________________________________   PHYSICAL EXAM:  VITAL SIGNS: ED Triage Vitals  Enc Vitals Group     BP 09/10/19 0755 (!) 186/91     Pulse Rate 09/10/19 0755 99     Resp 09/10/19 0755 19     Temp 09/10/19 0755 99 F (37.2 C)     Temp Source 09/10/19 0755 Oral     SpO2 09/10/19 0755 97 %     Weight 09/10/19 0756 190 lb (86.2 kg)     Height 09/10/19 0756 _0  (1.727 m)     Head Circumference --      Peak Flow --      Pain Score 09/10/19 0756 10     Pain Loc --      Pain Edu? --      Excl. in [place]? --     Constitutional: Alert and oriented. Well appearing but does appear in pain. Eyes: Conjunctivae are normal. EOMI. Head: Atraumatic. Nose: No  congestion/rhinnorhea. Mouth/Throat: Mucous membranes are moist.   Neck: No stridor. Trachea Midline. FROM Cardiovascular: Normal rate, regular rhythm. Grossly normal heart sounds.  Good peripheral circulation. Respiratory: Normal respiratory effort.  No retractions. Lungs CTAB. Gastrointestinal: Soft and nontender. No distention. No abdominal bruits.  Musculoskeletal: No lower extremity tenderness nor edema.  No joint effusions. Neurologic:  Normal speech and language. No gross focal neurologic deficits are appreciated.  Skin:  Skin is warm, dry and intact. No rash noted. Psychiatric: Mood and affect are normal. Speech and behavior are normal. GU: No discharge from the urethra.  No redness on the testicles.  No skin changes to the GU area ____________________________________________   LABS (all labs ordered are listed, but only abnormal results are displayed)  Labs Reviewed  CBC WITH DIFFERENTIAL/PLATELET - Abnormal; Notable for the following components:      Result Value   RBC 3.81 (*)    Hemoglobin 11.3 (*)    HCT 36.0 (*)    Neutro Abs 8.2 (*)    All other components within normal limits  COMPREHENSIVE METABOLIC PANEL - Abnormal; Notable for the following components:   Chloride 97 (*)    Glucose, Bld 107 (*)    Creatinine, Ser 1.54 (*)    Albumin 3.3 (*)    GFR calc non Af Amer 41 (*)    GFR calc Af Amer 48 (*)    All other components within normal limits  URINALYSIS, COMPLETE (UACMP) WITH MICROSCOPIC - Abnormal; Notable for the following components:   Color, Urine PINK (*)    APPearance TURBID (*)    Hgb urine dipstick MODERATE (*)    Protein, ur 100 (*)    Nitrite POSITIVE (*)    Leukocytes,Ua MODERATE (*)    RBC / HPF >50 (*)    WBC, UA >50 (*)    All other components within normal limits  URINE CULTURE  SARS CORONAVIRUS 2 (TAT 6-24 HRS)   ____________________________________________  RADIOLOGY   Official radiology report(s): Ct Renal Stone Study  Result  Date: 09/10/2019 CLINICAL DATA:  Right flank pain. EXAM: CT ABDOMEN AND PELVIS WITHOUT CONTRAST TECHNIQUE: Multidetector CT imaging of the abdomen and pelvis was performed following the standard protocol without IV contrast. COMPARISON:  February 05, 2018. FINDINGS: Lower chest: No acute abnormality. Hepatobiliary: Small gallstone is noted. No biliary dilatation is noted. No abnormality is seen in the liver on these unenhanced images. Pancreas: Unremarkable. No pancreatic ductal dilatation or surrounding inflammatory changes. Spleen: Normal in size without focal abnormality. Adrenals/Urinary Tract: Adrenal glands appear normal. Moderate bilateral hydroureteronephrosis is noted without obstructing calculus. Urinary bladder is decompressed. Stomach/Bowel: Stomach is within normal limits. Appendix appears normal. No evidence of bowel wall thickening, distention, or inflammatory changes. Vascular/Lymphatic: Aortic atherosclerosis. No enlarged abdominal or pelvic lymph nodes. Reproductive: Prostate is unremarkable. Other: No abdominal wall hernia or abnormality. No abdominopelvic ascites. Musculoskeletal: Multiple sclerotic lesions are noted throughout the spine in pelvis consistent with osseous metastases. IMPRESSION: Moderate bilateral hydroureteronephrosis is noted without obstructing calculus. Urinary bladder is decompressed. Small gallstone. Diffuse osseous metastases. Aortic Atherosclerosis (ICD10-I70.0). Electronically Signed   By: Marijo Conception M.D.   On: 09/10/2019 08:39    ____________________________________________   PROCEDURES  Procedure(s) performed (including Critical Care):  Procedures   ____________________________________________   INITIAL IMPRESSION / ASSESSMENT AND PLAN / ED COURSE  SIM CHOQUETTE was evaluated in Emergency Department on 09/10/2019 for the symptoms described in the history of present illness. He was evaluated in the context of the global COVID-19 pandemic, which  necessitated consideration that the patient might be at risk for infection with the SARS-CoV-2 virus that causes COVID-19. Institutional protocols and algorithms that pertain to the evaluation of patients at risk for COVID-19 are in a state of rapid change based on information released by regulatory bodies including the CDC and federal and state organizations. These policies and algorithms were followed during the patient's care in the ED.    Patient presents with GU pain.  No obvious skin changes on exam to suggest Fournier's gangrene.  Bladder scan without evidence of urinary retention.  CT scan ordered to evaluate for kidney stone, obstruction from a bladder cancer met.  No upper abdominal pain  to suggest ACS or shortness of breath to suggest PE.  Kidney function is 1.5 which is around patient's baseline.  CT scan concerning for bilateral hydronephrosis which is new from prior CT scan.  Given this plus the urine consistent with UTI and patient having flank tenderness I am concerned that patient has pyelonephritis.  Given patient is on oral chemotherapy for his prostate cancer and due to his significant pain will discuss with the hospital team for admission.  Place patient on ceftriaxone given 1 L of fluid.  Given his kidney function is stable I have lower suspicion that this is secondary to metastatic obstruction given he has no evidence on CT scan.  However his kidney function will need to be closely monitored.  Hospitalist asked to consult urology.  No response from pages so sent epic message.   ____________________________________________   FINAL CLINICAL IMPRESSION(S) / ED DIAGNOSES   Final diagnoses:  Pyelonephritis  Prostate cancer ([place])      MEDICATIONS GIVEN DURING THIS VISIT:  Medications  cefTRIAXone (ROCEPHIN) 1 g in sodium chloride 0.9 % 100 mL IVPB (has no administration in time range)  sodium chloride 0.9 % bolus 1,000 mL (has no administration in time range)   HYDROmorphone (DILAUDID) injection 1 mg (has no administration in time range)  HYDROmorphone (DILAUDID) injection 0.5 mg (  0.5 mg Intravenous Given 09/10/19 0848)  ondansetron (ZOFRAN) injection 4 mg (4 mg Intravenous Given 09/10/19 0858)  HYDROmorphone (DILAUDID) injection 1 mg (1 mg Intravenous Given 09/10/19 0910)     ED Discharge Orders    None       Note:  This document was prepared using Dragon voice recognition software and may include unintentional dictation errors.   Vanessa [place], MD 09/10/19 4656    Vanessa [place], MD 09/10/19 1100

## 2019-09-10 NOTE — ED Triage Notes (Signed)
Pt now complaining that the pain is in his penis. Pt appears uncomfortable. Pt with pain patch on and reports takes Oxy daily but has not given relief. Bladder scan only shows 33mls.

## 2019-09-11 DIAGNOSIS — N1330 Unspecified hydronephrosis: Secondary | ICD-10-CM

## 2019-09-11 LAB — CBC WITH DIFFERENTIAL/PLATELET
Abs Immature Granulocytes: 0.05 10*3/uL (ref 0.00–0.07)
Basophils Absolute: 0 10*3/uL (ref 0.0–0.1)
Basophils Relative: 0 %
Eosinophils Absolute: 0.1 10*3/uL (ref 0.0–0.5)
Eosinophils Relative: 2 %
HCT: 34.5 % — ABNORMAL LOW (ref 39.0–52.0)
Hemoglobin: 10.9 g/dL — ABNORMAL LOW (ref 13.0–17.0)
Immature Granulocytes: 1 %
Lymphocytes Relative: 13 %
Lymphs Abs: 1 10*3/uL (ref 0.7–4.0)
MCH: 30.1 pg (ref 26.0–34.0)
MCHC: 31.6 g/dL (ref 30.0–36.0)
MCV: 95.3 fL (ref 80.0–100.0)
Monocytes Absolute: 0.6 10*3/uL (ref 0.1–1.0)
Monocytes Relative: 8 %
Neutro Abs: 5.6 10*3/uL (ref 1.7–7.7)
Neutrophils Relative %: 76 %
Platelets: 284 10*3/uL (ref 150–400)
RBC: 3.62 MIL/uL — ABNORMAL LOW (ref 4.22–5.81)
RDW: 14.4 % (ref 11.5–15.5)
WBC: 7.3 10*3/uL (ref 4.0–10.5)
nRBC: 0 % (ref 0.0–0.2)

## 2019-09-11 LAB — BASIC METABOLIC PANEL
Anion gap: 7 (ref 5–15)
BUN: 22 mg/dL (ref 8–23)
CO2: 31 mmol/L (ref 22–32)
Calcium: 8.5 mg/dL — ABNORMAL LOW (ref 8.9–10.3)
Chloride: 101 mmol/L (ref 98–111)
Creatinine, Ser: 1.47 mg/dL — ABNORMAL HIGH (ref 0.61–1.24)
GFR calc Af Amer: 51 mL/min — ABNORMAL LOW (ref >60)
GFR calc non Af Amer: 44 mL/min — ABNORMAL LOW (ref >60)
Glucose, Bld: 91 mg/dL (ref 70–99)
Potassium: 4.7 mmol/L (ref 3.5–5.1)
Sodium: 139 mmol/L (ref 135–145)

## 2019-09-11 MED ORDER — ENOXAPARIN SODIUM 40 MG/0.4ML ~~LOC~~ SOLN
40.0000 mg | SUBCUTANEOUS | Status: DC
  Administered 2019-09-11 – 2019-09-12 (×2): 40 mg via SUBCUTANEOUS
  Filled 2019-09-11 (×2): qty 0.4

## 2019-09-11 MED ORDER — SODIUM CHLORIDE 0.9 % IV SOLN
INTRAVENOUS | Status: DC | PRN
  Administered 2019-09-11: 15 mL via INTRAVENOUS
  Administered 2019-09-12: 30 mL via INTRAVENOUS

## 2019-09-11 MED ORDER — HYDROMORPHONE HCL 1 MG/ML IJ SOLN: 0.5000 mg | INTRAMUSCULAR | Status: DC | PRN

## 2019-09-11 NOTE — Progress Notes (Signed)
PROGRESS NOTE  Johnathan Gould  DOB: 10-Jun-1935  PCP: [name], MD [id number]  DOA: 09/10/2019  LOS: 1 day   Brief narrative: Johnathan Gould is a 84 y.o. male with PMH of HTN, CAD, metastatic prostate cancer currently on oral chemotherapy, chronic bilateral hydronephrosis, and had bilateral PCN placement in 2017 and 2018, currently none. Patient presented to the ED on 09/10/2019 with complaints of abdominal discomfort in the suprapubic region, progressively worsening for several days with radiation to penis as well.  Pain gets worse with urination and was refractory to oxycodone and fentanyl patch at home. No fever, chills, vomiting, had some nausea. No fever no chills no chest pain no shortness of breath no cough no runny nose. Patient follows up with oncology as well as urology outpatient is SP bilateral PCN placement in the past which is currently removed.  In the ED, patient was hemodynamically stable.  He had an episode of elevated temperature of 100.4 after several hours of presentation. WBC normal.  Urinalysis showed pink turbid urine with moderate amount of leukocytes, positive nitrite CT renal study showed moderate bilateral hydroureteronephrosis is noted without obstructing calculus. Patient was seen by urologist.  No intervention required.  Admitted under hospitalist service mostly for pain management.  Subjective: Patient was seen and examined this morning.  Pleasant elderly Caucasian male.  Lying down in bed.  Abdominal pain is tolerable today with pain meds.  He still complains of pain on urination.  Assessment/Plan: Intractable abdominal and pelvic pain metastatic prostate cancer Bilateral hydronephrosis -Primary presented for abdominal discomfort worse on urination. -CT renal study showed chronic moderate bilateral hydroureteronephrosis without calculus. -No need of intervention per urology. -Pain likely related to UTI.  E. coli UTI -Urinalysis showed  pink turbid urine with moderate amount of leukocytes, positive nitrite -WBC normal.  1 episode of fever 1.4 last night. -Intractable abdominal pain likely secondary to UTI itself. -Urine culture is showing E. coli, pending sensitivity. -Currently on IV Rocephin.   Chronic kidney disease stage IIIa. Chronic bilateral lower extremity lymphedema -Creatinine 1.54 on presentation, 1.47 today.  At baseline.   -Lasix resumed.  Cardiovascular issues: HTN, CAD, HFpEF. -Home meds include aspirin, Plavix, torsemide which are all continued.    Metastatic prostate cancer -Per urology note, he does respond well on oral chemotherapy as well as prednisone 5 mg twice daily. Continue to follow-up with urology as an outpatient.  Pain management Patient has chronic pain syndrome, as well as pain due to metastatic prostate cancer. Patient is on fentanyl patch, gabapentin, oxycodone which are continued. Also on IV Dilaudid PRN for breakthrough pain control.  Mobility: Encourage ambulation DVT prophylaxis:  Lovenox subcu Code Status:   Code Status: DNR  Family Communication: Wife at bedside Expected Discharge:  Anticipate discharge to home tomorrow. Pending urine culture report.  Consultants:  Urologist, signed off  Procedures:  None  Antimicrobials: Anti-infectives (From admission, onward)   Start     Dose/Rate Route Frequency Ordered Stop   09/11/19 1000  cefTRIAXone (ROCEPHIN) 2 g in sodium chloride 0.9 % 100 mL IVPB     2 g 200 mL/hr over 30 Minutes Intravenous Every 24 hours 09/10/19 1359     09/10/19 1100  cefTRIAXone (ROCEPHIN) 2 g in sodium chloride 0.9 % 100 mL IVPB  Status:  Discontinued     2 g 200 mL/hr over 30 Minutes Intravenous Every 24 hours 09/10/19 1058 09/10/19 1359   09/10/19 1000  cefTRIAXone (ROCEPHIN) 1 g in sodium chloride 0.9 %  100 mL IVPB     1 g 200 mL/hr over 30 Minutes Intravenous  Once 09/10/19 0946 09/10/19 2038      Diet Order            Diet Heart  Room service appropriate? Yes; Fluid consistency: Thin  Diet effective now              Infusions:  . sodium chloride 15 mL (09/11/19 1103)  . cefTRIAXone (ROCEPHIN)  IV 2 g (09/11/19 1105)    Scheduled Meds: . abiraterone acetate  1,000 mg Oral Daily  . aspirin EC  81 mg Oral Daily  . clopidogrel  75 mg Oral Daily  . enoxaparin (LOVENOX) injection  40 mg Subcutaneous Q24H  . fentaNYL  1 patch Transdermal Q72H  . ferrous sulfate  325 mg Oral Q breakfast  . fluticasone  2 spray Each Nare Daily  . gabapentin  100 mg Oral QHS  . metoprolol tartrate  50 mg Oral BID  . pantoprazole  40 mg Oral BID  . predniSONE  5 mg Oral BID WC  . senna-docusate  2 tablet Oral BID  . sodium chloride flush  3 mL Intravenous Q12H  . tamsulosin  0.4 mg Oral Daily  . torsemide  20 mg Oral Daily  . vitamin C  250 mg Oral Daily    PRN meds: sodium chloride, acetaminophen **OR** acetaminophen, bisacodyl, HYDROmorphone (DILAUDID) injection, ondansetron **OR** ondansetron (ZOFRAN) IV, oxyCODONE, polyethylene glycol   Objective: Vitals:   09/11/19 1150 09/11/19 1209  BP: (!) 148/94 131/68  Pulse: 87 76  Resp: 14   Temp: 98.3 F (36.8 C) 98.6 F (37 C)  SpO2: 94% 91%    Intake/Output Summary (Last 24 hours) at 09/11/2019 1348 Last data filed at 09/11/2019 1030 Gross per 24 hour  Intake -  Output 200 ml  Net -200 ml   Filed Weights   09/10/19 0756  Weight: 86.2 kg   Weight change:  Body mass index is 28.89 kg/m.   Physical Exam: General exam: Appears calm.  Mild distress because of pain. Skin: No rashes, lesions or ulcers. HEENT: Atraumatic, normocephalic, supple neck, no obvious bleeding Lungs: Clear to auscultation bilaterally CVS: Regular rate and rhythm, no murmur GI/Abd soft, mild diffuse tenderness on palpation CNS: Alert, awake, oriented x3, hard of hearing Psychiatry: Mood appropriate Extremities: Chronic bilateral 1+ pedal edema, no calf tenderness  Data Review: I have  personally reviewed the laboratory data and studies available.  Recent Labs  Lab 09/10/19 0850 09/11/19 0845  WBC 10.1 7.3  NEUTROABS 8.2* 5.6  HGB 11.3* 10.9*  HCT 36.0* 34.5*  MCV 94.5 95.3  PLT 317 284   Recent Labs  Lab 09/10/19 0850 09/11/19 0845  NA 139 139  K 4.3 4.7  CL 97* 101  CO2 31 31  GLUCOSE 107* 91  BUN 21 22  CREATININE 1.54* 1.47*  CALCIUM 8.9 8.5*    Terrilee Croak, MD  [facility] 09/11/2019

## 2019-09-12 LAB — URINE CULTURE: Culture: >=100000 — AB

## 2019-09-12 LAB — BASIC METABOLIC PANEL
Anion gap: 9 (ref 5–15)
BUN: 22 mg/dL (ref 8–23)
CO2: 32 mmol/L (ref 22–32)
Calcium: 8.4 mg/dL — ABNORMAL LOW (ref 8.9–10.3)
Chloride: 100 mmol/L (ref 98–111)
Creatinine, Ser: 1.5 mg/dL — ABNORMAL HIGH (ref 0.61–1.24)
GFR calc Af Amer: 50 mL/min — ABNORMAL LOW (ref >60)
GFR calc non Af Amer: 43 mL/min — ABNORMAL LOW (ref >60)
Glucose, Bld: 103 mg/dL — ABNORMAL HIGH (ref 70–99)
Potassium: 4.2 mmol/L (ref 3.5–5.1)
Sodium: 141 mmol/L (ref 135–145)

## 2019-09-12 MED ORDER — CEFDINIR 300 MG PO CAPS
300.0000 mg | ORAL_CAPSULE | Freq: Two times a day (BID) | ORAL | Status: DC
  Administered 2019-09-13: 300 mg via ORAL
  Filled 2019-09-12 (×2): qty 1

## 2019-09-12 MED ORDER — POLYETHYLENE GLYCOL 3350 17 G PO PACK: 17.0000 g | PACK | Freq: Every day | ORAL | 0 refills | Status: AC | PRN

## 2019-09-12 MED ORDER — CEFDINIR 300 MG PO CAPS: 300.0000 mg | ORAL_CAPSULE | Freq: Two times a day (BID) | ORAL | 0 refills | Status: AC

## 2019-09-12 NOTE — Discharge Instructions (Signed)
Hydronephrosis    Hydronephrosis is the swelling of one or both kidneys due to a blockage that stops urine from flowing out of the body. Kidneys filter waste from the blood and produce urine. This condition can lead to kidney failure and may become life threatening if not treated promptly.  What are the causes?  Common causes of this condition include:  · Problems that occur when a baby is developing in the womb (congenital defect). These can include problems:  ? In the kidneys.  ? In the tubes that drain urine from the kidneys into the bladder (ureters).  · Kidney stones.  · Bladder infection.  · An enlarged prostate gland.  · Scar tissue from a previous surgery or injury.  · A blood clot.  · A tumor or cyst in the abdomen or pelvis.  · Cancer of the prostate, bladder, uterus, ovary, or colon.  What are the signs or symptoms?  Symptoms of this condition include:  · Pain or discomfort in your side (flank).  · Pain and swelling in your abdomen.  · Nausea and vomiting.  · Fever.  · Pain when passing urine.  · Feelings of urgency when you need to urinate.  · Urinating more often than normal.  In some cases, you may not have any symptoms.  How is this diagnosed?  This condition may be diagnosed based on:  · Your symptoms and medical history.  · A physical exam.  · Blood and urine tests.  · Imaging tests, such as an ultrasound, CT scan, or MRI.  · A procedure in which a scope is inserted into the urethra and used to view parts of the urinary tract and bladder (cystoscopy).  How is this treated?  Treatment for this condition depends on where the blockage is, how long it has been there, and what caused it. The goal of treatment is to remove the blockage. Treatment may include:  · Antibiotic medicines to treat or prevent infection.  · A procedure to place a small, thin tube (stent) into a blocked ureter. The stent will keep the ureter open so that urine can drain through it.  · A nonsurgical procedure that crushes kidney  stones with shock waves (extracorporeal shock wave lithotripsy).  · If kidney failure occurs, treatment may include dialysis or a kidney transplant.  Follow these instructions at home:    · Take over-the-counter and prescription medicines only as told by your health care provider.  · Rest and return to your normal activities as told by your health care provider. Ask your health care provider what activities are safe for you.  · Drink enough fluid to keep your urine pale yellow.  · If you were prescribed an antibiotic medicine, take it exactly as told by your health care provider. Do not stop taking the antibiotic even if you start to feel better.  · Keep all follow-up visits as told by your health care provider. This is important.  Contact a health care provider if:  · You continue to have symptoms after treatment.  · You develop new symptoms.  · Your urine becomes cloudy or bloody.  · You have a fever.  Get help right away if:  · You have severe flank or abdominal pain.  · You cannot drink fluids without vomiting.  Summary  · Hydronephrosis is the swelling of one or both kidneys due to a blockage that stops urine from flowing out of the body.  · Hydronephrosis can lead to kidney   home, including instructions about drinking fluids, taking medicines, and limiting activities. This information is not intended to replace advice given to you by your health care provider. Make sure you discuss any questions you have with your health care provider. Document Released: 08/15/2007 Document Revised: 10/29/2017 Document Reviewed: 10/29/2017 Elsevier Patient Education  2020 [employer]. Acute Urinary  Retention, Male  Acute urinary retention means that you cannot pee (urinate) at all, or that you pee too little and your bladder is not emptied completely. If it is not treated, it can lead to kidney damage or other serious problems. Follow these instructions at home:  Take over-the-counter and prescription medicines only as told by your doctor. Ask your doctor what medicines you should stay away from. Do not take any medicine unless your doctor says it is okay to do so.  If you were sent home with a tube that drains the bladder (catheter), take care of it as told by your doctor.  Drink enough fluid to keep your pee clear or pale yellow.  If you were given an antibiotic, take it as told by your doctor. Do not stop taking the antibiotic even if you start to feel better.  Do not use any products that contain nicotine or tobacco, such as cigarettes and e-cigarettes. If you need help quitting, ask your doctor.  Watch for changes in your symptoms. Tell your doctor about them.  If told, track changes in your blood pressure at home. Tell your doctor about them.  Keep all follow-up visits as told by your doctor. This is important. Contact a doctor if:  You have spasms or you leak pee when you have spasms. Get help right away if:  You have chills or a fever.  You have a tube that drains the bladder and: ? The tube stops draining pee. ? The tube falls out.  You have blood in your pee. Summary  Acute urinary retention means that you have problems peeing. It may mean that you cannot pee at all, or that you pee too little.  If this condition is not treated, it can lead to kidney damage or other serious problems.  If you were sent home with a tube that drains the bladder, take care of it as told by your doctor.  Monitor any changes in your symptoms. Tell your doctor about any changes. This information is not intended to replace advice given to you by your health care provider. Make sure  you discuss any questions you have with your health care provider. Document Released: 04/05/2008 Document Revised: 01/04/2019 Document Reviewed: 11/19/2016 Elsevier Patient Education  2020 [employer].

## 2019-09-12 NOTE — TOC Initial Note (Signed)
Transition of Care [facility]) - Initial/Assessment Note    Patient Details  Name: Johnathan Gould MRN: [name] Date of Birth: [birth year]  [facility]) CM/SW Contact:    Latanya Maudlin, RN Phone Number: 09/12/2019, 6:56 PM  Clinical Narrative:  TOC consulted to complete high risk readmission assessment. Patient lives at home with his wife. Patient is completely independent at baseline and uses a cane. Patient also has a rolling walker and bedside commode but does not use it. Patient ambulated with PT and PT had no follow up recommendations since patient was able to do so well. PCP is Masoud. Patient uses Product/process development scientist and obtains medications without issues. Patient still drives to appointments, etc. There are no TOC team needs identified.                Expected Discharge Plan: Home/Self Care Barriers to Discharge: Continued Medical Work up   Patient Goals and CMS Choice        Expected Discharge Plan and Services Expected Discharge Plan: Home/Self Care       Living arrangements for the past 2 months: Single Family Home                                      Prior Living Arrangements/Services Living arrangements for the past 2 months: Single Family Home Lives with:: Spouse Patient language and need for interpreter reviewed:: Yes Do you feel safe going back to the place where you live?: Yes      Need for Family Participation in Patient Care: No (Comment)   Current home services: DME Criminal Activity/Legal Involvement Pertinent to Current Situation/Hospitalization: No - Comment as needed  Activities of Daily Living Home Assistive Devices/Equipment: Cane (specify quad or straight) ADL Screening (condition at time of admission) Patient's cognitive ability adequate to safely complete daily activities?: Yes Is the patient deaf or have difficulty hearing?: Yes Does the patient have difficulty seeing, even when wearing glasses/contacts?: No Does the patient  have difficulty concentrating, remembering, or making decisions?: No Patient able to express need for assistance with ADLs?: Yes Does the patient have difficulty dressing or bathing?: No Independently performs ADLs?: Yes (appropriate for developmental age) Does the patient have difficulty walking or climbing stairs?: Yes Weakness of Legs: Both Weakness of Arms/Hands: None  Permission Sought/Granted                  Emotional Assessment Appearance:: Appears younger than stated age Attitude/Demeanor/Rapport: Ambitious, Engaged Affect (typically observed): Stable Orientation: : Oriented to Self, Oriented to Place, Oriented to  Time, Oriented to Situation      Admission diagnosis:  Prostate cancer ([place]) [C61] Pyelonephritis [N12] Patient Active Problem List   Diagnosis Date Noted  . Bilateral hydronephrosis 09/10/2019  . Lymphedema 07/23/2019  . Palliative care by specialist   . NSTEMI (non-ST elevated myocardial infarction) ([place]) 02/06/2018  . Epigastric pain 02/05/2018  . Goals of care, counseling/discussion 12/10/2016  . Anemia of chronic renal failure 11/22/2016  . Hypotension 11/20/2016  . Dehydration 11/20/2016  . Urinary retention 11/20/2016  . Prostate cancer metastatic to bone ([place]) 11/20/2016  . Prostate cancer ([place])   . Urinary obstruction 10/16/2016  . Acute renal failure (ARF) ([place]) 10/16/2016  . Bladder mass 10/16/2016  . CAD (coronary artery disease) 06/24/2014  . Benign hypertension with CKD (chronic kidney disease) stage III ([place]) 06/24/2014  . RAD (reactive airway disease) 06/24/2014  PCP:  [name], MD Pharmacy:   [place], [place] - [place] [address] [place] [address] Phone: [phone] Fax: [phone]  [facility], [place] - [address] [place]  [address] Phone: [phone] Fax: [place], [place] - [place] [address] Phone: [phone] Fax: [phone]     Social Determinants of Health ([place]) Interventions    Readmission Risk Interventions Readmission Risk Prevention Plan 09/12/2019  Transportation Screening Complete  PCP or Specialist Appt within 3-5 Days Complete  HRI or [place] Complete  Social Work Consult for [place] Planning/Counseling Patient refused  Palliative Care Screening Not Applicable  Medication Review Press photographer) Complete  Some recent data might be hidden

## 2019-09-12 NOTE — Progress Notes (Signed)
PROGRESS NOTE  Johnathan Gould [id number] DOB: February 06, 1935 DOA: 09/10/2019 PCP: [name], MD  HPI/Recap of past 24 hours: Johnathan Gould is a 84 y.o. male with PMH of HTN, CAD, metastatic prostate cancer currently on oral chemotherapy, chronic bilateral hydronephrosis, and had bilateral PCN placement in 2017 and 2018, currently none. Patient presented to the ED on 09/10/2019 with complaints of abdominal discomfort in the suprapubic region, progressively worsening for several days with radiation to penis as well.  Pain gets worse with urination and was refractory to oxycodone and fentanyl patch at home. No fever, chills, vomiting, had some nausea. No fever no chills no chest pain no shortness of breath no cough no runny nose. Patient follows up with oncology as well as urology outpatient is SP bilateral PCN placement in the past which is currently removed.  In the ED, patient was hemodynamically stable.  He had an episode of elevated temperature of 100.4 after several hours of presentation. WBC normal.  Urinalysis showed pink turbid urine with moderate amount of leukocytes, positive nitrite CT renal study showed moderate bilateral hydroureteronephrosis is noted without obstructing calculus. Patient was seen by urologist.  No intervention required.  Admitted under hospitalist service mostly for pain management.  09/12/19: Patient was seen and examined at his bedside this morning.  No acute events overnight.  He reports right lower extremity lymphedema greater than 7 months.  Denies chills or night sweats.  No abdominal pain or nausea.  Assessment/Plan: Active Problems:   Bilateral hydronephrosis  Resolved intractable abdominal and pelvic pain in the setting of metastatic prostate cancer with osseous metastasis  Denies abdominal pain or pelvic pain at the time of this visit  Worsening AKI on CKD 3 in the setting of diuretics Baseline creatinine appears to be 1.2 with GFR  54 Presented with creatinine of 1.54 with GFR 44 Creatinine trending up 1.50 on 09/12/2019 with GFR 43. Currently on torsemide due to volume overload Net I&O -2.1 L Start daily BMP Continue to closely monitor renal function, urine output and electrolytes  Right lower extremity lymphedema Present for past several months Appears stable Follow-up with provider outpatient  Bilateral hydronephrosis -Primary presented for abdominal discomfort worse on urination. -CT renal study showed chronic moderate bilateral hydroureteronephrosis without calculus. -Seen by urology.  No need of intervention per urology. Monitor urine output  E. coli UTI -Urinalysis showed pink turbid urine with moderate amount of leukocytes, positive nitrite -WBC normal.  1 episode of fever 1.4 last night. -Urine culture is showing E. coli, pending sensitivity. -Completed course of IV Rocephin Switch to cefdinir on 09/12/2019  Cardiovascular issues: HTN, CAD, HFpEF. -Home meds include aspirin, Plavix, torsemide which are all continued.    Metastatic prostate cancer with osseous metastasis -Per urology note, he does respond well on oral chemotherapy as well as prednisone 5 mg twice daily. Continue to follow-up with urology as an outpatient.  Chronic pain syndrome  Continue current pain management Patient is on fentanyl patch, gabapentin, oxycodone which are continued. Also on IV Dilaudid PRN for breakthrough pain control.  Physical debility PT OT to assess Fall precautions  Mobility: Encourage ambulation DVT prophylaxis: Lovenox subcu daily Code Status:  Code Status: DNR  Family Communication: None at bedside. Expected Discharge:  Possible discharge tomorrow 09/13/2019.  Consultants:  Urologist, signed off  Procedures:  None       Objective: Vitals:   09/11/19 1209 09/11/19 2025 09/12/19 0505 09/12/19 1408  BP: 131/68 (!) 148/89 (!) 152/91 113/67  Pulse:  76 93 75 67  Resp:  20 20  16   Temp: 98.6 F (37 C)  98.4 F (36.9 C) 99.4 F (37.4 C)  TempSrc: Oral Oral Oral Oral  SpO2: 91% 96% 97% 95%  Weight:   90.3 kg   Height:        Intake/Output Summary (Last 24 hours) at 09/12/2019 1510 Last data filed at 09/12/2019 1500 Gross per 24 hour  Intake 354.03 ml  Output 1625 ml  Net -1270.97 ml   Filed Weights   09/10/19 0756 09/12/19 0505  Weight: 86.2 kg 90.3 kg    Exam:  . General: 84 y.o. year-old male well developed well nourished in no acute distress.  Alert and interactive. . Cardiovascular: Regular rate and rhythm with no rubs or gallops.  No thyromegaly or JVD noted.   Marland Kitchen Respiratory: Clear to auscultation with no wheezes or rales. Good inspiratory effort. . Abdomen: Soft nontender nondistended with normal bowel sounds x4 quadrants. . Musculoskeletal: Right lower extremity twice the size of left lower extremity edema due to lymphedema. Marland Kitchen Psychiatry: Mood is appropriate for condition and setting   Data Reviewed: CBC: Recent Labs  Lab 09/10/19 0850 09/11/19 0845  WBC 10.1 7.3  NEUTROABS 8.2* 5.6  HGB 11.3* 10.9*  HCT 36.0* 34.5*  MCV 94.5 95.3  PLT 317 [id number]   Basic Metabolic Panel: Recent Labs  Lab 09/10/19 0850 09/11/19 0845 09/12/19 0553  NA 139 139 141  K 4.3 4.7 4.2  CL 97* 101 100  CO2 31 31 32  GLUCOSE 107* 91 103*  BUN 21 22 22   CREATININE 1.54* 1.47* 1.50*  CALCIUM 8.9 8.5* 8.4*   GFR: Estimated Creatinine Clearance: 41.5 mL/min (A) (by C-G formula based on SCr of 1.5 mg/dL (H)). Liver Function Tests: Recent Labs  Lab 09/10/19 0850  AST 15  ALT 9  ALKPHOS 94  BILITOT 0.5  PROT 7.1  ALBUMIN 3.3*   No results for input(s): LIPASE, AMYLASE in the last 168 hours. No results for input(s): AMMONIA in the last 168 hours. Coagulation Profile: No results for input(s): INR, PROTIME in the last 168 hours. Cardiac Enzymes: No results for input(s): CKTOTAL, CKMB, CKMBINDEX, TROPONINI in the last 168 hours. BNP (last 3  results) No results for input(s): PROBNP in the last 8760 hours. HbA1C: No results for input(s): HGBA1C in the last 72 hours. CBG: No results for input(s): GLUCAP in the last 168 hours. Lipid Profile: No results for input(s): CHOL, HDL, LDLCALC, TRIG, CHOLHDL, LDLDIRECT in the last 72 hours. Thyroid Function Tests: No results for input(s): TSH, T4TOTAL, FREET4, T3FREE, THYROIDAB in the last 72 hours. Anemia Panel: No results for input(s): VITAMINB12, FOLATE, FERRITIN, TIBC, IRON, RETICCTPCT in the last 72 hours. Urine analysis:    Component Value Date/Time   COLORURINE PINK (A) 09/10/2019 0850   APPEARANCEUR TURBID (A) 09/10/2019 0850   LABSPEC 1.019 09/10/2019 0850   PHURINE 7.0 09/10/2019 0850   GLUCOSEU NEGATIVE 09/10/2019 0850   HGBUR MODERATE (A) 09/10/2019 0850   BILIRUBINUR NEGATIVE 09/10/2019 0850   KETONESUR NEGATIVE 09/10/2019 0850   PROTEINUR 100 (A) 09/10/2019 0850   NITRITE POSITIVE (A) 09/10/2019 0850   LEUKOCYTESUR MODERATE (A) 09/10/2019 0850   Sepsis Labs: @LABRCNTIP (procalcitonin:4,lacticidven:4)  ) Recent Results (from the past 240 hour(s))  Urine culture     Status: Abnormal   Collection Time: 09/10/19  9:37 AM   Specimen: Urine, Random  Result Value Ref Range Status   Specimen Description   Final  URINE, RANDOM Performed at [facility], [address] [place], [place] [address]    Special Requests   Final    NONE Performed at [facility], [place], [place] [address]    Culture >=100,000 COLONIES/mL ESCHERICHIA COLI (A)  Final   Report Status 09/12/2019 FINAL  Final   Organism ID, Bacteria ESCHERICHIA COLI (A)  Final      Susceptibility   Escherichia coli - MIC*    AMPICILLIN >=32 RESISTANT Resistant     CEFAZOLIN <=4 SENSITIVE Sensitive     CEFTRIAXONE <=1 SENSITIVE Sensitive     CIPROFLOXACIN <=0.25 SENSITIVE Sensitive     GENTAMICIN <=1 SENSITIVE Sensitive     IMIPENEM <=0.25 SENSITIVE Sensitive      NITROFURANTOIN <=16 SENSITIVE Sensitive     TRIMETH/SULFA <=20 SENSITIVE Sensitive     AMPICILLIN/SULBACTAM >=32 RESISTANT Resistant     PIP/TAZO <=4 SENSITIVE Sensitive     Extended ESBL NEGATIVE Sensitive     * >=100,000 COLONIES/mL ESCHERICHIA COLI  SARS CORONAVIRUS 2 (TAT 6-24 HRS) Nasopharyngeal Nasopharyngeal Swab     Status: None   Collection Time: 09/10/19 10:21 AM   Specimen: Nasopharyngeal Swab  Result Value Ref Range Status   SARS Coronavirus 2 NEGATIVE NEGATIVE Final    Comment: (NOTE) SARS-CoV-2 target nucleic acids are NOT DETECTED. The SARS-CoV-2 RNA is generally detectable in upper and lower respiratory specimens during the acute phase of infection. Negative results do not preclude SARS-CoV-2 infection, do not rule out co-infections with other pathogens, and should not be used as the sole basis for treatment or other patient management decisions. Negative results must be combined with clinical observations, patient history, and epidemiological information. The expected result is Negative. Fact Sheet for Patients: SugarRoll.be Fact Sheet for Healthcare Providers: https://www.woods-mathews.com/ This test is not yet approved or cleared by the [place] FDA and  has been authorized for detection and/or diagnosis of SARS-CoV-2 by FDA under an Emergency Use Authorization (EUA). This EUA will remain  in effect (meaning this test can be used) for the duration of the COVID-19 declaration under Section 56 4(b)(1) of the Act, 21 U.S.C. section 360bbb-3(b)(1), unless the authorization is terminated or revoked sooner. Performed at [facility], [place] [address] [place], [place] [address]       Studies: No results found.  Scheduled Meds: . abiraterone acetate  1,000 mg Oral Daily  . aspirin EC  81 mg Oral Daily  . [START ON 09/13/2019] cefdinir  300 mg Oral Q12H  . clopidogrel  75 mg Oral Daily  . enoxaparin (LOVENOX)  injection  40 mg Subcutaneous Q24H  . fentaNYL  1 patch Transdermal Q72H  . ferrous sulfate  325 mg Oral Q breakfast  . fluticasone  2 spray Each Nare Daily  . gabapentin  100 mg Oral QHS  . metoprolol tartrate  50 mg Oral BID  . pantoprazole  40 mg Oral BID  . predniSONE  5 mg Oral BID WC  . senna-docusate  2 tablet Oral BID  . sodium chloride flush  3 mL Intravenous Q12H  . tamsulosin  0.4 mg Oral Daily  . torsemide  20 mg Oral Daily  . vitamin C  250 mg Oral Daily    Continuous Infusions: . sodium chloride Stopped (09/12/19 1250)     LOS: 2 days     Kayleen Memos, MD [facility] Pager [phone]  If 7PM-7AM, please contact night-coverage [email or web address] Password [email or web address] 09/12/2019, 3:10 PM

## 2019-09-12 NOTE — Evaluation (Signed)
Physical Therapy Evaluation Patient Details Name: Johnathan Gould MRN: [name] DOB: Mar 25, 1935 Today's Date: 09/12/2019   History of Present Illness  Johnathan Gould is an 73yoM who comes to [facility] on 11/9 c worseing pubic pain in the setting of chronic hydronephrosis. Pt noted to have UTI. HTN, CAD, metastatic prostate cancer currently on oral chemotherapy, chronic bilateral hydronephrosis, and had bilateral PCN placement in 2017 and 2018, currently none.  Clinical Impression  Pt admitted with above diagnosis. Pt currently with functional limitations due to the deficits listed below (see "PT Problem List"). Upon entry, pt in bed, awake and agreeable to participate. The pt is alert and oriented x4, pleasant, conversational, and generally a good historian. Pt performing all mobility independently at his baseline level of function. Pt denies acute impairment or limitation compared to baseline. Pt speaks at length to the decline of his mobility and over the past 3-4 years while his metastatic disease and other medical issues has progressed using good detail and an excellent level of acceptance for his limitations. Pt also speaks with grace and appreciation for many good things in his life that sustain him emotionally and spiritually. PT/wife decline any DME or therapy need at DC. Patient is at baseline, all education completed, and time is given to address all questions/concerns. No additional skilled PT services needed at this time, PT signing off. PT recommends daily ambulation ad lib or with nursing staff as needed to prevent deconditioning.         Follow Up Recommendations No PT follow up    Equipment Recommendations  None recommended by PT    Recommendations for Other Services       Precautions / Restrictions Precautions Precautions: Fall Restrictions Weight Bearing Restrictions: No      Mobility  Bed Mobility Overal bed mobility: Modified Independent                 Transfers Overall transfer level: Modified independent Equipment used: Straight cane                Ambulation/Gait Ambulation/Gait assistance: Independent Gait Distance (Feet): 120 Feet Assistive device: None;Straight cane(intermittent SPC use) Gait Pattern/deviations: WFL(Within Functional Limits)        Stairs            Wheelchair Mobility    Modified Rankin (Stroke Patients Only)       Balance Overall balance assessment: Independent                                           Pertinent Vitals/Pain Pain Assessment: (CC pain has fully resolved; Pt has baseline chronic pain from widespread metastatic disease.)    Home Living Family/patient expects to be discharged to:: Private residence Living Arrangements: Spouse/significant other Available Help at Discharge: Family Type of Home: House Home Access: Stairs to enter Entrance Stairs-Rails: Right Entrance Stairs-Number of Steps: 2 Home Layout: One [place] - single point;Walker - 2 wheels;Bedside commode      Prior Function Level of Independence: Independent with assistive device(s)               Hand Dominance   Dominant Hand: Right    Extremity/Trunk Assessment   Upper Extremity Assessment Upper Extremity Assessment: Overall WFL for tasks assessed    Lower Extremity Assessment Lower Extremity Assessment: Overall WFL for tasks assessed  Communication   Communication: No difficulties  Cognition Arousal/Alertness: Awake/alert Behavior During Therapy: WFL for tasks assessed/performed Overall Cognitive Status: Within Functional Limits for tasks assessed                                        General Comments      Exercises     Assessment/Plan    PT Assessment Patent does not need any further PT services  PT Problem List         PT Treatment Interventions      PT Goals (Current goals can be found in the Care Plan  section)  Acute Rehab PT Goals PT Goal Formulation: All assessment and education complete, DC therapy    Frequency     Barriers to discharge        Co-evaluation               AM-PAC PT "6 Clicks" Mobility  Outcome Measure Help needed turning from your back to your side while in a flat bed without using bedrails?: None Help needed moving from lying on your back to sitting on the side of a flat bed without using bedrails?: None Help needed moving to and from a bed to a chair (including a wheelchair)?: None Help needed standing up from a chair using your arms (e.g., wheelchair or bedside chair)?: None Help needed to walk in hospital room?: None Help needed climbing 3-5 steps with a railing? : A Little 6 Click Score: 23    End of Session   Activity Tolerance: Patient tolerated treatment well Patient left: in bed;with family/visitor present;with call bell/phone within reach Nurse Communication: Mobility status PT Visit Diagnosis: Pain Pain - Right/Left: (central pubic pain c penis referral)    Time: [id number] PT Time Calculation (min) (ACUTE ONLY): 15 min   Charges:   PT Evaluation $PT Eval Low Complexity: 1 Low          4:28 PM, 09/12/19 Etta Grandchild, PT, DPT Physical Therapist - [facility]  [phone] ([place])    , C 09/12/2019, 4:25 PM

## 2019-09-12 NOTE — Progress Notes (Signed)
Attempted to return call to patient's son. VM left.

## 2019-09-13 ENCOUNTER — Other Ambulatory Visit: Payer: Self-pay | Admitting: Physician Assistant

## 2019-09-13 DIAGNOSIS — N1330 Unspecified hydronephrosis: Secondary | ICD-10-CM

## 2019-09-13 LAB — BASIC METABOLIC PANEL
Anion gap: 10 (ref 5–15)
BUN: 27 mg/dL — ABNORMAL HIGH (ref 8–23)
CO2: 32 mmol/L (ref 22–32)
Calcium: 8.6 mg/dL — ABNORMAL LOW (ref 8.9–10.3)
Chloride: 99 mmol/L (ref 98–111)
Creatinine, Ser: 1.91 mg/dL — ABNORMAL HIGH (ref 0.61–1.24)
GFR calc Af Amer: 37 mL/min — ABNORMAL LOW (ref >60)
GFR calc non Af Amer: 32 mL/min — ABNORMAL LOW (ref >60)
Glucose, Bld: 106 mg/dL — ABNORMAL HIGH (ref 70–99)
Potassium: 4 mmol/L (ref 3.5–5.1)
Sodium: 141 mmol/L (ref 135–145)

## 2019-09-13 LAB — BRAIN NATRIURETIC PEPTIDE: B Natriuretic Peptide: 190 pg/mL — ABNORMAL HIGH (ref 0.0–100.0)

## 2019-09-13 NOTE — Progress Notes (Signed)
Urology Inpatient Progress Note  Subjective: Johnathan Gould is a 84 y.o. male admitted on 09/10/2019 with pyelonephritis, on antibiotics as below.  PMH significant for metastatic prostate cancer with chronic bilateral hydronephrosis, previously managed with nephrostomy tubes.  Urine culture ultimately returned with ampicillin-resistant E. coli.  Creatinine up slightly today, 1.91.  He remains hypertensive and afebrile.  Today, patient reports improvement in his reported pain from admission, however he does have chronic pain syndrome associated with his metastatic disease.  He is spontaneously voiding and denies dysuria.  He has no acute concerns.  Plans for discharge today.   Anti-infectives: Anti-infectives (From admission, onward)   Start     Dose/Rate Route Frequency Ordered Stop   09/13/19 1000  cefdinir (OMNICEF) capsule 300 mg     300 mg Oral Every 12 hours 09/12/19 1222     09/12/19 0000  cefdinir (OMNICEF) 300 MG capsule     300 mg Oral 2 times daily 09/12/19 1614 09/22/19 2359   09/11/19 1000  cefTRIAXone (ROCEPHIN) 2 g in sodium chloride 0.9 % 100 mL IVPB  Status:  Discontinued     2 g 200 mL/hr over 30 Minutes Intravenous Every 24 hours 09/10/19 1359 09/12/19 1222   09/10/19 1100  cefTRIAXone (ROCEPHIN) 2 g in sodium chloride 0.9 % 100 mL IVPB  Status:  Discontinued     2 g 200 mL/hr over 30 Minutes Intravenous Every 24 hours 09/10/19 1058 09/10/19 1359   09/10/19 1000  cefTRIAXone (ROCEPHIN) 1 g in sodium chloride 0.9 % 100 mL IVPB     1 g 200 mL/hr over 30 Minutes Intravenous  Once 09/10/19 0946 09/10/19 2038      Current Facility-Administered Medications  Medication Dose Route Frequency Provider Last Rate Last Dose  . 0.9 %  sodium chloride infusion   Intravenous PRN Terrilee Croak, MD   Stopped at 09/12/19 1250  . abiraterone acetate (ZYTIGA) tablet 1,000 mg  1,000 mg Oral Daily Lavina Hamman, MD      . acetaminophen (TYLENOL) tablet 650 mg  650 mg Oral Q6H PRN  Lavina Hamman, MD   650 mg at 09/10/19 2054   Or  . acetaminophen (TYLENOL) suppository 650 mg  650 mg Rectal Q6H PRN Lavina Hamman, MD      . aspirin EC tablet 81 mg  81 mg Oral Daily Lavina Hamman, MD   81 mg at 09/12/19 [id number]  . bisacodyl (DULCOLAX) suppository 10 mg  10 mg Rectal Daily PRN Lavina Hamman, MD      . cefdinir (OMNICEF) capsule 300 mg  300 mg Oral Q12H Hall, Carole N, DO      . clopidogrel (PLAVIX) tablet 75 mg  75 mg Oral Daily Lavina Hamman, MD   75 mg at 09/12/19 0851  . enoxaparin (LOVENOX) injection 40 mg  40 mg Subcutaneous Q24H Dahal, Marlowe Aschoff, MD   40 mg at 09/12/19 2134  . fentaNYL (DURAGESIC) 25 MCG/HR 1 patch  1 patch Transdermal Q72H Lavina Hamman, MD   1 patch at 09/11/19 1051  . ferrous sulfate tablet 325 mg  325 mg Oral Q breakfast Lavina Hamman, MD   325 mg at 09/12/19 0851  . fluticasone (FLONASE) 50 MCG/ACT nasal spray 2 spray  2 spray Each Nare Daily Lavina Hamman, MD   2 spray at 09/12/19 0853  . gabapentin (NEURONTIN) capsule 100 mg  100 mg Oral QHS Lavina Hamman, MD   100 mg at 09/12/19 2134  .  HYDROmorphone (DILAUDID) injection 0.5 mg  0.5 mg Intravenous Q3H PRN Rito Ehrlich A, RPH      . metoprolol tartrate (LOPRESSOR) tablet 50 mg  50 mg Oral BID Lavina Hamman, MD   50 mg at 09/12/19 2134  . ondansetron (ZOFRAN) tablet 4 mg  4 mg Oral Q6H PRN Lavina Hamman, MD       Or  . ondansetron [facility]) injection 4 mg  4 mg Intravenous Q6H PRN Lavina Hamman, MD      . oxyCODONE (Oxy IR/ROXICODONE) immediate release tablet 20 mg  20 mg Oral Q6H PRN Lavina Hamman, MD   20 mg at 09/12/19 2134  . pantoprazole (PROTONIX) EC tablet 40 mg  40 mg Oral BID Lavina Hamman, MD   40 mg at 09/12/19 2134  . polyethylene glycol (MIRALAX / GLYCOLAX) packet 17 g  17 g Oral Daily PRN Lavina Hamman, MD      . predniSONE (DELTASONE) tablet 5 mg  5 mg Oral BID WC Lavina Hamman, MD   5 mg at 09/12/19 1810  . senna-docusate (Senokot-S) tablet 2 tablet  2  tablet Oral BID Lavina Hamman, MD   2 tablet at 09/12/19 2134  . sodium chloride flush (NS) 0.9 % injection 3 mL  3 mL Intravenous Q12H Lavina Hamman, MD   3 mL at 09/12/19 2200  . tamsulosin (FLOMAX) capsule 0.4 mg  0.4 mg Oral Daily Lavina Hamman, MD   0.4 mg at 09/12/19 0851  . torsemide (DEMADEX) tablet 20 mg  20 mg Oral Daily Lavina Hamman, MD   20 mg at 09/12/19 0851  . vitamin C (ASCORBIC ACID) tablet 250 mg  250 mg Oral Daily Lavina Hamman, MD   250 mg at 09/12/19 1055   Facility-Administered Medications Ordered in Other Encounters  Medication Dose Route Frequency Provider Last Rate Last Dose  . 0.9 %  sodium chloride infusion   Intravenous Continuous Sindy Guadeloupe, MD   Stopped at 04/28/18 1452  . Leuprolide Acetate (6 Month) (LUPRON) injection 45 mg  45 mg Intramuscular Q6 months Sindy Guadeloupe, MD      . Leuprolide Acetate (6 Month) (LUPRON) injection 45 mg  45 mg Intramuscular Q6 months Sindy Guadeloupe, MD   45 mg at 08/07/19 1213     Objective: Vital signs in last 24 hours: Temp:  [98.5 F (36.9 C)-99.4 F (37.4 C)] 99 F (37.2 C) (11/12 0824) Pulse Rate:  [66-81] 75 (11/12 0824) Resp:  [14-20] 14 (11/12 0824) BP: (113-174)/(67-90) 174/90 (11/12 0824) SpO2:  [94 %-95 %] 94 % (11/12 0824) Weight:  [92.6 kg] 92.6 kg (11/12 0500)  Intake/Output from previous day: 11/11 0701 - 11/12 0700 In: 234 [P.O.:120; I.V.:14; IV Piggyback:100] Out: 1400 [Urine:1400] Intake/Output this shift: No intake/output data recorded.  Physical Exam Vitals signs and nursing note reviewed.  Constitutional:      General: He is not in acute distress.    Appearance: Normal appearance. He is not ill-appearing, toxic-appearing or diaphoretic.  HENT:     Head: Normocephalic and atraumatic.  Pulmonary:     Effort: Pulmonary effort is normal. No respiratory distress.  Skin:    General: Skin is warm and dry.  Neurological:     Mental Status: He is alert and oriented to person, place,  and time.  Psychiatric:        Mood and Affect: Mood normal.        Behavior: Behavior  normal.    Lab Results:  Recent Labs    09/11/19 0845  WBC 7.3  HGB 10.9*  HCT 34.5*  PLT 284   BMET Recent Labs    09/12/19 0553 09/13/19 0359  NA 141 141  K 4.2 4.0  CL 100 99  CO2 32 32  GLUCOSE 103* 106*  BUN 22 27*  CREATININE 1.50* 1.91*  CALCIUM 8.4* 8.6*   Assessment & Plan: 84 year old male with metastatic prostate cancer, admitted for pyelonephritis with chronic, mild underlying hydronephrosis.  Plans for discharge later today.  Creatinine slightly up today, however his creatinine has historically been variable and today's value is within his typical range.  No intervention indicated.  Recommendations: -Outpatient follow-up in 2 weeks with renal ultrasound and repeat creatinine (ordered) -Total of a 2-week course of culture appropriate antibiotics  Debroah Loop, PA-C 09/13/2019

## 2019-09-13 NOTE — Care Management Important Message (Signed)
Important Message  Patient Details  Name: KAHLO KLECHA MRN: [name] Date of Birth: 11/28/1934   Medicare Important Message Given:  Yes     Dannette Barbara 09/13/2019, 1:35 PM

## 2019-09-13 NOTE — Discharge Summary (Signed)
Discharge Summary  Johnathan Gould [id number] DOB: 1934-11-05  PCP: [name], MD  Admit date: 09/10/2019 Discharge date: 09/13/2019  Time spent: 35 minutes  Recommendations for Outpatient Follow-up:  1. Follow-up with urology 2. Follow-up with oncology 3. Follow up with cardiology 4. Follow-up with PCP 5. Take your medications as prescribed 6. Continue physical therapy with fall precautions  Discharge Diagnoses:  Active Hospital Problems   Diagnosis Date Noted  . Bilateral hydronephrosis 09/10/2019    Resolved Hospital Problems  No resolved problems to display.    Discharge Condition: Stable  Diet recommendation: Resume previous diet  Vitals:   09/13/19 0824 09/13/19 1210  BP: (!) 174/90 118/79  Pulse: 75 (!) 59  Resp: 14 16  Temp: 99 F (37.2 C) 99 F (37.2 C)  SpO2: 94% 99%    History of present illness:   Johnathan Gould a 84 y.o.malewith PMH of HTN, CAD, metastatic prostate cancer currently on oral chemotherapy, chronic bilateral hydronephrosis,and had bilateral PCN placement in 2017 and 2018,currently none. Patient presented to the ED on11/9/2020with complaints of abdominal discomfort in the suprapubic region,progressively worsening for several days with radiation topenis as well.Pain gets worse with urination and was refractory to oxycodone and fentanyl patch at home. No fever, chills, vomiting, had some nausea. No fever no chills no chest pain no shortness of breath no cough no runny nose. Patient follows up with oncology as well as urology outpatient is SP bilateral PCN placement in the past which is currently removed.  In the ED, patient was hemodynamically stable.He had anepisodeofelevated temperatureof 100.4 after several hours of presentation. WBC normal. Urinalysis showed pink turbid urine with moderate amount of leukocytes, positive nitrite CT renal study showed moderate bilateral hydroureteronephrosis is noted without  obstructing calculus. Patient was seen by urologist. No intervention required. Admitted under hospitalist service mostly for pain management.  09/13/19: Patient was seen and examined at his bedside this morning.  No acute events overnight.  He has no new complaints and wants to go home.   Hospital Course:  Active Problems:   Bilateral hydronephrosis  Resolved intractable abdominal and pelvic pain in the setting of metastatic prostate cancer with osseous metastasis  Denies abdominal pain or pelvic pain at the time of this visit  Worsening AKI on CKD 3 in the setting of diuretics Presented with creatinine of 1.54 with GFR 44 Creatinine trending up 1.91 with GFR of 32 on 09/13/19 from 1.50 on 09/12/2019 with GFR 43. Received torsemide due to volume overload Net I&O -2.2L Holding off home lasix and torsemide. Defer to cardiology to restart diuretics Obtain BMP on Monday 09/17/19  Right lower extremity lymphedema Present for past several months Appears stable Follow-up with provider outpatient  Bilateral hydronephrosis -Primary presented for abdominal discomfort worse on urination. -CT renal study showed chronic moderate bilateral hydroureteronephrosis without calculus. -Seen by urology.  No need of intervention per urology. Monitor urine output  E. coli UTI -Urinalysis showed pink turbid urine with moderate amount of leukocytes, positive nitrite -WBC normal. 1 episode of fever 1.4 last night. -Urine culture is showing E. coli, pending sensitivity. -Completed course of IV Rocephin Switch to cefdinir on 09/12/2019, continue for 10 days. Follow up with your PCP  Cardiovascular issues:HTN, CAD,HFpEF. -Home meds include aspirin, Plavix,  Torsemide and lasix held due to AKI Sodium restriction less than 2 grams per day. Follow up with your cardiologist  Metastatic prostate cancer with osseous metastasis -Per urology note, he does respond well on oral chemotherapy as  well as prednisone 5 mg twice daily. Continue to follow-up with urology as an outpatient. Follow up with urology and oncology  Chronic pain syndrome  Continue home painmanagement  Physical debility No further recommendations per PT   Code Status:Code Status: DNR   Consultants:  Urology  Procedures:  None    Discharge Exam: BP 118/79   Pulse (!) 59   Temp 99 F (37.2 C) (Oral)   Resp 16   Ht 5\' 8"  (1.727 m)   Wt 92.6 kg   SpO2 99%   BMI 31.03 kg/m  . General: 84 y.o. year-old male well developed well nourished in no acute distress.  Alert and oriented x4. Very hard of hearing. . Cardiovascular: Regular rate and rhythm with no rubs or gallops.  No thyromegaly or JVD noted.   Marland Kitchen Respiratory: Clear to auscultation with no wheezes or rales. Good inspiratory effort. . Abdomen: Soft nontender nondistended with normal bowel sounds x4 quadrants. . Musculoskeletal: R lower extremity with chronic lymphedema. Trace LLE edema.  Marland Kitchen Psychiatry: Mood is appropriate for condition and setting  Discharge Instructions You were cared for by a hospitalist during your hospital stay. If you have any questions about your discharge medications or the care you received while you were in the hospital after you are discharged, you can call the unit and asked to speak with the hospitalist on call if the hospitalist that took care of you is not available. Once you are discharged, your primary care physician will handle any further medical issues. Please note that NO REFILLS for any discharge medications will be authorized once you are discharged, as it is imperative that you return to your primary care physician (or establish a relationship with a primary care physician if you do not have one) for your aftercare needs so that they can reassess your need for medications and monitor your lab values.   Allergies as of 09/13/2019   No Known Allergies     Medication List    STOP taking these  medications   calcium gluconate 500 MG tablet   furosemide 20 MG tablet Commonly known as: LASIX   omeprazole 40 MG capsule Commonly known as: PRILOSEC   Potassium Chloride ER 20 MEQ Tbcr   torsemide 20 MG tablet Commonly known as: DEMADEX     TAKE these medications   abiraterone acetate 250 MG tablet Commonly known as: ZYTIGA Take 4 tablets by mouth daily. Take on empty stomach. 1 hour before food or 2 hours after.   aspirin EC 81 MG tablet Take 81 mg by mouth daily.   calcium carbonate 1500 (600 Ca) MG Tabs tablet Commonly known as: OSCAL Take by mouth daily with breakfast.   cefdinir 300 MG capsule Commonly known as: OMNICEF Take 1 capsule (300 mg total) by mouth 2 (two) times daily for 10 days.   clopidogrel 75 MG tablet Commonly known as: PLAVIX Take 75 mg by mouth daily.   cyanocobalamin 100 MCG tablet Take 100 mcg by mouth daily.   fentaNYL 25 MCG/HR Commonly known as: [place]wn 1 patch onto the skin every 3 (three) days.   ferrous sulfate 325 (65 FE) MG tablet Take 325 mg by mouth daily with breakfast.   gabapentin 100 MG capsule Commonly known as: NEURONTIN Take 1 capsule (100 mg total) by mouth at bedtime.   loratadine 10 MG tablet Commonly known as: CLARITIN Take 10 mg by mouth daily.   metoprolol tartrate 50 MG tablet Commonly known as: LOPRESSOR Take 50  mg by mouth 2 (two) times daily.   mometasone 50 MCG/ACT nasal spray Commonly known as: NASONEX   nitroGLYCERIN 0.4 MG SL tablet Commonly known as: NITROSTAT Place 0.4 mg under the tongue every 5 (five) minutes x 3 doses as needed for chest pain.   ondansetron 4 MG disintegrating tablet Commonly known as: Zofran ODT Take 1 tablet (4 mg total) by mouth every 8 (eight) hours as needed for nausea or vomiting.   Oxycodone HCl 10 MG Tabs Take 2 tablets (20 mg total) by mouth every 6 (six) hours as needed.   pantoprazole 40 MG tablet Commonly known as: PROTONIX Take 1 tablet (40 mg  total) by mouth 2 (two) times daily.   polyethylene glycol 17 g packet Commonly known as: MIRALAX / GLYCOLAX Take 17 g by mouth daily as needed for mild constipation.   predniSONE 5 MG tablet Commonly known as: DELTASONE TAKE 1 TABLET (5 MG TOTAL) BY MOUTH 2 TIMES DAILY WITH A MEAL.   QC Tumeric Complex 500 MG Caps Generic drug: Turmeric Take by mouth daily.   sennosides-docusate sodium 8.6-50 MG tablet Commonly known as: SENOKOT-S Take 2 tablets by mouth 2 (two) times daily.   tamsulosin 0.4 MG Caps capsule Commonly known as: Flomax Take 1 capsule (0.4 mg total) by mouth daily.   vitamin C 250 MG tablet Commonly known as: ASCORBIC ACID Take 250 mg by mouth daily.   Vitamin D 50 MCG (2000 UT) tablet Take 2,000 Units by mouth daily.      No Known Allergies Follow-up Information    [name], MD. Call in 1 day(s).   Specialty: Internal Medicine Why: Please call for a post hospital follow up  Contact information: [place] [address] [phone]        Hollice Espy, MD. Call in 1 day(s).   Specialty: Urology Why: Please call for a post hospital follow-up appointment. Contact information: [place] [address] [phone]        Teodoro Spray, MD. Call in 1 day(s).   Specialty: Cardiology Why: please call for a post hospital follow up appointment. Contact information: [place] [address] [phone]        Sindy Guadeloupe, MD. Call in 1 day(s).   Specialty: Oncology Why: Please call for a post hospital follow up appointment. Contact information: [place] [address] [phone]            The results of significant diagnostics from this hospitalization (including imaging, microbiology, ancillary and laboratory) are listed below for reference.    Significant Diagnostic Studies: Ct Renal Stone Study  Result Date: 09/10/2019 CLINICAL  DATA:  Right flank pain. EXAM: CT ABDOMEN AND PELVIS WITHOUT CONTRAST TECHNIQUE: Multidetector CT imaging of the abdomen and pelvis was performed following the standard protocol without IV contrast. COMPARISON:  February 05, 2018. FINDINGS: Lower chest: No acute abnormality. Hepatobiliary: Small gallstone is noted. No biliary dilatation is noted. No abnormality is seen in the liver on these unenhanced images. Pancreas: Unremarkable. No pancreatic ductal dilatation or surrounding inflammatory changes. Spleen: Normal in size without focal abnormality. Adrenals/Urinary Tract: Adrenal glands appear normal. Moderate bilateral hydroureteronephrosis is noted without obstructing calculus. Urinary bladder is decompressed. Stomach/Bowel: Stomach is within normal limits. Appendix appears normal. No evidence of bowel wall thickening, distention, or inflammatory changes. Vascular/Lymphatic: Aortic atherosclerosis. No enlarged abdominal or pelvic lymph nodes. Reproductive: Prostate is unremarkable. Other: No abdominal wall hernia or  abnormality. No abdominopelvic ascites. Musculoskeletal: Multiple sclerotic lesions are noted throughout the spine in pelvis consistent with osseous metastases. IMPRESSION: Moderate bilateral hydroureteronephrosis is noted without obstructing calculus. Urinary bladder is decompressed. Small gallstone. Diffuse osseous metastases. Aortic Atherosclerosis (ICD10-I70.0). Electronically Signed   By: Marijo Conception M.D.   On: 09/10/2019 08:39    Microbiology: Recent Results (from the past 240 hour(s))  Urine culture     Status: Abnormal   Collection Time: 09/10/19  9:37 AM   Specimen: Urine, Random  Result Value Ref Range Status   Specimen Description   Final    URINE, RANDOM Performed at [facility], [address] [place], [place] [address]    Special Requests   Final    NONE Performed at [facility], [place], [place] [address]    Culture >=100,000  COLONIES/mL ESCHERICHIA COLI (A)  Final   Report Status 09/12/2019 FINAL  Final   Organism ID, Bacteria ESCHERICHIA COLI (A)  Final      Susceptibility   Escherichia coli - MIC*    AMPICILLIN >=32 RESISTANT Resistant     CEFAZOLIN <=4 SENSITIVE Sensitive     CEFTRIAXONE <=1 SENSITIVE Sensitive     CIPROFLOXACIN <=0.25 SENSITIVE Sensitive     GENTAMICIN <=1 SENSITIVE Sensitive     IMIPENEM <=0.25 SENSITIVE Sensitive     NITROFURANTOIN <=16 SENSITIVE Sensitive     TRIMETH/SULFA <=20 SENSITIVE Sensitive     AMPICILLIN/SULBACTAM >=32 RESISTANT Resistant     PIP/TAZO <=4 SENSITIVE Sensitive     Extended ESBL NEGATIVE Sensitive     * >=100,000 COLONIES/mL ESCHERICHIA COLI  SARS CORONAVIRUS 2 (TAT 6-24 HRS) Nasopharyngeal Nasopharyngeal Swab     Status: None   Collection Time: 09/10/19 10:21 AM   Specimen: Nasopharyngeal Swab  Result Value Ref Range Status   SARS Coronavirus 2 NEGATIVE NEGATIVE Final    Comment: (NOTE) SARS-CoV-2 target nucleic acids are NOT DETECTED. The SARS-CoV-2 RNA is generally detectable in upper and lower respiratory specimens during the acute phase of infection. Negative results do not preclude SARS-CoV-2 infection, do not rule out co-infections with other pathogens, and should not be used as the sole basis for treatment or other patient management decisions. Negative results must be combined with clinical observations, patient history, and epidemiological information. The expected result is Negative. Fact Sheet for Patients: SugarRoll.be Fact Sheet for Healthcare Providers: https://www.woods-mathews.com/ This test is not yet approved or cleared by the [place] FDA and  has been authorized for detection and/or diagnosis of SARS-CoV-2 by FDA under an Emergency Use Authorization (EUA). This EUA will remain  in effect (meaning this test can be used) for the duration of the COVID-19 declaration under Section  56 4(b)(1) of the Act, 21 U.S.C. section 360bbb-3(b)(1), unless the authorization is terminated or revoked sooner. Performed at [facility], [place] [address], [place], [place] [address]      Labs: Basic Metabolic Panel: Recent Labs  Lab 09/10/19 0850 09/11/19 0845 09/12/19 0553 09/13/19 0359  NA 139 139 141 141  K 4.3 4.7 4.2 4.0  CL 97* 101 100 99  CO2 31 31 32 32  GLUCOSE 107* 91 103* 106*  BUN 21 22 22  27*  CREATININE 1.54* 1.47* 1.50* 1.91*  CALCIUM 8.9 8.5* 8.4* 8.6*   Liver Function Tests: Recent Labs  Lab 09/10/19 0850  AST 15  ALT 9  ALKPHOS 94  BILITOT 0.5  PROT 7.1  ALBUMIN 3.3*   No results for  input(s): LIPASE, AMYLASE in the last 168 hours. No results for input(s): AMMONIA in the last 168 hours. CBC: Recent Labs  Lab 09/10/19 0850 09/11/19 0845  WBC 10.1 7.3  NEUTROABS 8.2* 5.6  HGB 11.3* 10.9*  HCT 36.0* 34.5*  MCV 94.5 95.3  PLT 317 284   Cardiac Enzymes: No results for input(s): CKTOTAL, CKMB, CKMBINDEX, TROPONINI in the last 168 hours. BNP: BNP (last 3 results) Recent Labs    09/13/19 0359  BNP 190.0*    ProBNP (last 3 results) No results for input(s): PROBNP in the last 8760 hours.  CBG: No results for input(s): GLUCAP in the last 168 hours.     Signed:  Kayleen Memos, MD [facility] 09/13/2019, 12:49 PM

## 2019-09-13 NOTE — Progress Notes (Signed)
Bobbye Riggs to be D/C'd home per MD order.  Discussed prescriptions and follow up appointments with the patient. Prescriptions given to patient, medication list explained in detail. Pt verbalized understanding.  Allergies as of 09/13/2019   No Known Allergies     Medication List    STOP taking these medications   calcium gluconate 500 MG tablet   furosemide 20 MG tablet Commonly known as: LASIX   omeprazole 40 MG capsule Commonly known as: PRILOSEC   Potassium Chloride ER 20 MEQ Tbcr   torsemide 20 MG tablet Commonly known as: DEMADEX     TAKE these medications   abiraterone acetate 250 MG tablet Commonly known as: ZYTIGA Take 4 tablets by mouth daily. Take on empty stomach. 1 hour before food or 2 hours after.   aspirin EC 81 MG tablet Take 81 mg by mouth daily.   calcium carbonate 1500 (600 Ca) MG Tabs tablet Commonly known as: OSCAL Take by mouth daily with breakfast.   cefdinir 300 MG capsule Commonly known as: OMNICEF Take 1 capsule (300 mg total) by mouth 2 (two) times daily for 10 days.   clopidogrel 75 MG tablet Commonly known as: PLAVIX Take 75 mg by mouth daily.   cyanocobalamin 100 MCG tablet Take 100 mcg by mouth daily.   fentaNYL 25 MCG/HR Commonly known as: [place]ink 1 patch onto the skin every 3 (three) days.   ferrous sulfate 325 (65 FE) MG tablet Take 325 mg by mouth daily with breakfast.   gabapentin 100 MG capsule Commonly known as: NEURONTIN Take 1 capsule (100 mg total) by mouth at bedtime.   loratadine 10 MG tablet Commonly known as: CLARITIN Take 10 mg by mouth daily.   metoprolol tartrate 50 MG tablet Commonly known as: LOPRESSOR Take 50 mg by mouth 2 (two) times daily.   mometasone 50 MCG/ACT nasal spray Commonly known as: NASONEX   nitroGLYCERIN 0.4 MG SL tablet Commonly known as: NITROSTAT Place 0.4 mg under the tongue every 5 (five) minutes x 3 doses as needed for chest pain.   ondansetron 4 MG  disintegrating tablet Commonly known as: Zofran ODT Take 1 tablet (4 mg total) by mouth every 8 (eight) hours as needed for nausea or vomiting.   Oxycodone HCl 10 MG Tabs Take 2 tablets (20 mg total) by mouth every 6 (six) hours as needed.   pantoprazole 40 MG tablet Commonly known as: PROTONIX Take 1 tablet (40 mg total) by mouth 2 (two) times daily.   polyethylene glycol 17 g packet Commonly known as: MIRALAX / GLYCOLAX Take 17 g by mouth daily as needed for mild constipation.   predniSONE 5 MG tablet Commonly known as: DELTASONE TAKE 1 TABLET (5 MG TOTAL) BY MOUTH 2 TIMES DAILY WITH A MEAL.   QC Tumeric Complex 500 MG Caps Generic drug: Turmeric Take by mouth daily.   sennosides-docusate sodium 8.6-50 MG tablet Commonly known as: SENOKOT-S Take 2 tablets by mouth 2 (two) times daily.   tamsulosin 0.4 MG Caps capsule Commonly known as: Flomax Take 1 capsule (0.4 mg total) by mouth daily.   vitamin C 250 MG tablet Commonly known as: ASCORBIC ACID Take 250 mg by mouth daily.   Vitamin D 50 MCG (2000 UT) tablet Take 2,000 Units by mouth daily.       Vitals:   09/13/19 0824 09/13/19 1210  BP: (!) 174/90 118/79  Pulse: 75 (!) 59  Resp: 14 16  Temp: 99 F (37.2 C) 99 F (37.2  C)  SpO2: 94% 99%    Skin clean, dry and intact without evidence of skin break down, no evidence of skin tears noted. IV catheter discontinued intact. Site without signs and symptoms of complications. Dressing and pressure applied. Pt denies pain at this time. No complaints noted.  An After Visit Summary was printed and given to the patient. Patient escorted via [place], and D/C home via private auto.  Chuck Hint RN [facility] [employer]

## 2019-09-19 ENCOUNTER — Other Ambulatory Visit: Payer: Self-pay | Admitting: Oncology

## 2019-09-19 DIAGNOSIS — C61 Malignant neoplasm of prostate: Secondary | ICD-10-CM

## 2019-09-25 MED FILL — predniSONE 5 MG TABS: 5 | 30 days supply | Qty: 60 | Fill #0

## 2019-09-25 MED FILL — ABIRATERONE ACETATE 250 MG: 250 | 30 days supply | Qty: 120 | Fill #0

## 2019-10-01 ENCOUNTER — Other Ambulatory Visit: Payer: Self-pay | Admitting: *Deleted

## 2019-10-01 ENCOUNTER — Telehealth: Payer: Self-pay | Admitting: *Deleted

## 2019-10-01 ENCOUNTER — Other Ambulatory Visit: Payer: Self-pay

## 2019-10-01 ENCOUNTER — Ambulatory Visit
Admission: RE | Admit: 2019-10-01 | Discharge: 2019-10-01 | Disposition: A | Discharge status: Home / Self Care | Payer: Medicare Other | Source: Ambulatory Visit | Attending: Physician Assistant | Admitting: Physician Assistant

## 2019-10-01 DIAGNOSIS — N1330 Unspecified hydronephrosis: Secondary | ICD-10-CM | POA: Insufficient documentation

## 2019-10-01 MED ORDER — OXYCODONE HCL 10 MG PO TABS: 20.0000 mg | ORAL_TABLET | Freq: Four times a day (QID) | ORAL | 0 refills | Status: DC | PRN

## 2019-10-01 MED ORDER — FENTANYL 25 MCG/HR TD PT72: 1.0000 | MEDICATED_PATCH | TRANSDERMAL | 0 refills | Status: DC

## 2019-10-01 NOTE — Telephone Encounter (Signed)
Called pt and let the wife know that the rx refills were sent intoday

## 2019-10-01 NOTE — Telephone Encounter (Signed)
Error

## 2019-10-01 NOTE — Telephone Encounter (Signed)
Pt called to get refills of pain meds today. rx sent to [facility] for renewal

## 2019-10-02 ENCOUNTER — Telehealth: Payer: Self-pay | Admitting: Urology

## 2019-10-02 ENCOUNTER — Ambulatory Visit: Payer: Medicare Other | Admitting: Urology

## 2019-10-02 ENCOUNTER — Encounter: Payer: Self-pay | Admitting: Urology

## 2019-10-02 NOTE — Telephone Encounter (Signed)
Patient notified and scheduled 

## 2019-10-02 NOTE — Telephone Encounter (Signed)
Patient was a no-show today for clinic.  He was not aware that he had a follow-up appointment with [place] and apologize profusely.  Reviewed renal ultrasound which he had yesterday.  It appears that it is bilateral hydronephrosis has improved/resolved.  He is no longer symptomatic from his UTI.  I recommended that he follow-up with [place] tomorrow for a lab only visit to recheck his creatinine.  The order is already in place.  Please contact him and find out when he can stop by the labs.  We will call this result.  Hollice Espy, MD

## 2019-10-03 ENCOUNTER — Other Ambulatory Visit: Payer: Medicare Other

## 2019-10-03 ENCOUNTER — Other Ambulatory Visit: Payer: Self-pay

## 2019-10-03 DIAGNOSIS — N1330 Unspecified hydronephrosis: Secondary | ICD-10-CM

## 2019-10-04 LAB — BASIC METABOLIC PANEL
BUN/Creatinine Ratio: 10 (ref 10–24)
BUN: 14 mg/dL (ref 8–27)
CO2: 26 mmol/L (ref 20–29)
Calcium: 9.2 mg/dL (ref 8.6–10.2)
Chloride: 101 mmol/L (ref 96–106)
Creatinine, Ser: 1.47 mg/dL — ABNORMAL HIGH (ref 0.76–1.27)
GFR calc Af Amer: 51 mL/min/{1.73_m2} — ABNORMAL LOW (ref >59)
GFR calc non Af Amer: 44 mL/min/{1.73_m2} — ABNORMAL LOW (ref >59)
Glucose: 128 mg/dL — ABNORMAL HIGH (ref 65–99)
Potassium: 4.8 mmol/L (ref 3.5–5.2)
Sodium: 139 mmol/L (ref 134–144)

## 2019-10-05 ENCOUNTER — Telehealth: Payer: Self-pay | Admitting: Physician Assistant

## 2019-10-05 NOTE — Telephone Encounter (Signed)
Patient notified of lab results.  I explained that his creatinine is back at baseline.  No further follow-up needed.  He is to follow-up with [place] as needed.  He expressed understanding.

## 2019-10-24 MED FILL — predniSONE 5 MG TABS: 5 | 30 days supply | Qty: 60 | Fill #1

## 2019-10-24 MED FILL — ABIRATERONE ACETATE 250 MG: 250 | 30 days supply | Qty: 120 | Fill #1

## 2019-10-29 ENCOUNTER — Ambulatory Visit (INDEPENDENT_AMBULATORY_CARE_PROVIDER_SITE_OTHER): Payer: Medicare Other | Admitting: Vascular Surgery

## 2019-10-31 ENCOUNTER — Other Ambulatory Visit: Payer: Self-pay | Admitting: *Deleted

## 2019-10-31 ENCOUNTER — Telehealth: Payer: Self-pay | Admitting: *Deleted

## 2019-10-31 NOTE — Telephone Encounter (Signed)
Pt called for monthly refill, called pt. To let him know that I sent request in to md for approval.

## 2019-11-01 MED ORDER — FENTANYL 25 MCG/HR TD PT72: 1.0000 | MEDICATED_PATCH | TRANSDERMAL | 0 refills | Status: DC

## 2019-11-01 MED ORDER — OXYCODONE HCL 10 MG PO TABS: 20.0000 mg | ORAL_TABLET | Freq: Four times a day (QID) | ORAL | 0 refills | Status: DC | PRN

## 2019-11-08 NOTE — Progress Notes (Signed)
Patient is coming in for follow up he is doing well no complaints  

## 2019-11-09 ENCOUNTER — Inpatient Hospital Stay: Payer: Medicare Other | Attending: Oncology

## 2019-11-09 ENCOUNTER — Inpatient Hospital Stay (HOSPITAL_BASED_OUTPATIENT_CLINIC_OR_DEPARTMENT_OTHER): Payer: Medicare Other | Admitting: Oncology

## 2019-11-09 ENCOUNTER — Other Ambulatory Visit: Payer: Self-pay

## 2019-11-09 ENCOUNTER — Telehealth: Payer: Self-pay | Admitting: Pharmacy Technician

## 2019-11-09 ENCOUNTER — Encounter: Payer: Self-pay | Admitting: Oncology

## 2019-11-09 VITALS — BP 173/93 | HR 94 | Temp 97.8°F | Wt 200.0 lb

## 2019-11-09 DIAGNOSIS — Z87891 Personal history of nicotine dependence: Secondary | ICD-10-CM | POA: Diagnosis not present

## 2019-11-09 DIAGNOSIS — M810 Age-related osteoporosis without current pathological fracture: Secondary | ICD-10-CM | POA: Insufficient documentation

## 2019-11-09 DIAGNOSIS — Z79899 Other long term (current) drug therapy: Secondary | ICD-10-CM | POA: Diagnosis not present

## 2019-11-09 DIAGNOSIS — D631 Anemia in chronic kidney disease: Secondary | ICD-10-CM | POA: Diagnosis not present

## 2019-11-09 DIAGNOSIS — C7951 Secondary malignant neoplasm of bone: Secondary | ICD-10-CM | POA: Diagnosis not present

## 2019-11-09 DIAGNOSIS — C61 Malignant neoplasm of prostate: Secondary | ICD-10-CM

## 2019-11-09 DIAGNOSIS — N189 Chronic kidney disease, unspecified: Secondary | ICD-10-CM | POA: Insufficient documentation

## 2019-11-09 DIAGNOSIS — G893 Neoplasm related pain (acute) (chronic): Secondary | ICD-10-CM | POA: Insufficient documentation

## 2019-11-09 LAB — COMPREHENSIVE METABOLIC PANEL
ALT: 11 U/L (ref 0–44)
AST: 16 U/L (ref 15–41)
Albumin: 3.4 g/dL — ABNORMAL LOW (ref 3.5–5.0)
Alkaline Phosphatase: 87 U/L (ref 38–126)
Anion gap: 10 (ref 5–15)
BUN: 18 mg/dL (ref 8–23)
CO2: 27 mmol/L (ref 22–32)
Calcium: 8.9 mg/dL (ref 8.9–10.3)
Chloride: 102 mmol/L (ref 98–111)
Creatinine, Ser: 1.78 mg/dL — ABNORMAL HIGH (ref 0.61–1.24)
GFR calc Af Amer: 40 mL/min — ABNORMAL LOW (ref >60)
GFR calc non Af Amer: 35 mL/min — ABNORMAL LOW (ref >60)
Glucose, Bld: 170 mg/dL — ABNORMAL HIGH (ref 70–99)
Potassium: 4 mmol/L (ref 3.5–5.1)
Sodium: 139 mmol/L (ref 135–145)
Total Bilirubin: 0.5 mg/dL (ref 0.3–1.2)
Total Protein: 6.9 g/dL (ref 6.5–8.1)

## 2019-11-09 LAB — CBC WITH DIFFERENTIAL/PLATELET
Abs Immature Granulocytes: 0.05 10*3/uL (ref 0.00–0.07)
Basophils Absolute: 0 10*3/uL (ref 0.0–0.1)
Basophils Relative: 0 %
Eosinophils Absolute: 0 10*3/uL (ref 0.0–0.5)
Eosinophils Relative: 0 %
HCT: 37.8 % — ABNORMAL LOW (ref 39.0–52.0)
Hemoglobin: 11.6 g/dL — ABNORMAL LOW (ref 13.0–17.0)
Immature Granulocytes: 1 %
Lymphocytes Relative: 13 %
Lymphs Abs: 0.8 10*3/uL (ref 0.7–4.0)
MCH: 29.1 pg (ref 26.0–34.0)
MCHC: 30.7 g/dL (ref 30.0–36.0)
MCV: 94.7 fL (ref 80.0–100.0)
Monocytes Absolute: 0.4 10*3/uL (ref 0.1–1.0)
Monocytes Relative: 6 %
Neutro Abs: 5.1 10*3/uL (ref 1.7–7.7)
Neutrophils Relative %: 80 %
Platelets: 355 10*3/uL (ref 150–400)
RBC: 3.99 MIL/uL — ABNORMAL LOW (ref 4.22–5.81)
RDW: 13.9 % (ref 11.5–15.5)
WBC: 6.4 10*3/uL (ref 4.0–10.5)
nRBC: 0 % (ref 0.0–0.2)

## 2019-11-09 LAB — PSA: Prostatic Specific Antigen: 0.01 ng/mL (ref 0.00–4.00)

## 2019-11-09 NOTE — Telephone Encounter (Signed)
Oral Oncology Patient Advocate Encounter  Was successful in securing patient a $8000 grant from [employer] to provide copayment coverage for Zytiga.  This will keep the out of pocket expense at $0.     [employer] ID: [id number]  I have spoken with the patient.   The billing information is as follows and has been shared with [place].    RxBin: [id number] PCN: [id number] Member ID: [id number] Group ID: [id number] Dates of Eligibility: 10/10/2019 through 10/08/2020  Fund: [place] Patient [place] Phone [phone] Fax [phone] 11/09/2019 11:07 AM

## 2019-11-09 NOTE — Progress Notes (Signed)
Patient stated that he continues to have back pain.

## 2019-11-12 NOTE — Progress Notes (Signed)
Hematology/Oncology Consult note [facility]  [phone] [phone]  Patient Care Team: [name], MD as PCP - General (Internal Medicine)   Name of the patient: Johnathan Gould  [id number]  May 26, 1935   Date of visit: 11/12/19  Diagnosis- 1. 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 Anemia of chronic kidney disease   Chief complaint/ Reason for visit-routine follow-up of prostate cancer on Zytiga  Heme/Onc history: patient is a85 year old male was recently admitted to the hospital on 10/16/2016 with symptoms of left hip pain and renal failure. He was found to have bilateral hydronephrosis and is status post bilateral nephrostomy tube placement. CT abdomen showed an irregular polypoid mass at the posterior aspect of the bladder highly concerning for bladder carcinoma. Extensive bulky adenopathy throughout the abdomen and pelvis consistent with nodal metastases. Widespread osseous metastatic disease. Bone scan showed multifocal areas of increased activity throughout the pelvis, bilateral femurs, rib cage, right scapula, thoracic cervical and lumbar spine consistent with metastatic disease. Patient was found to have enlarged abnormal prostate as well as an elevated PSA of 469 and was presumptively diagnosed with metastatic prostate cancer. He was started on Firmagon by urology. He was also seen by radiation oncology and started palliative radiation to his left hip on 11/04/2016 and finished 10 doses of palliative Rt.  He is currently on lupron q6 months  4 core biopsy obtained for tissue diagnosis which showed: prostate adenocarcinoma gleasons 9 and 10 in all 4 cores  zytiga started in March 2018. He is on fosamax for his osteoporosiswhich was stopped due to heartburn  Patient was admitted to the hospital on 02/05/18 with worsening epigastric pain. He was subsequently found to have acute MITwo-vessel coronary artery disease,  with 80% stenosis proximal LAD, occluded mid left circumflex, subtotal occlusion OM1, and patent stent mid RCAand underwent DES placement  Interval history-he reports that his low back pain is overall stable with oxycodone.  He is trying to come off fentanyl patches.  Denies any other new complaints at this time.  ECOG PS- 2 Pain scale- 0 Opioid associated constipation- no  Review of systems- Review of Systems  Constitutional: Positive for malaise/fatigue. Negative for chills, fever and weight loss.  HENT: Negative for congestion, ear discharge and nosebleeds.   Eyes: Negative for blurred vision.  Respiratory: Negative for cough, hemoptysis, sputum production, shortness of breath and wheezing.   Cardiovascular: Negative for chest pain, palpitations, orthopnea and claudication.  Gastrointestinal: Negative for abdominal pain, blood in stool, constipation, diarrhea, heartburn, melena, nausea and vomiting.  Genitourinary: Negative for dysuria, flank pain, frequency, hematuria and urgency.  Musculoskeletal: Positive for back pain. Negative for joint pain and myalgias.  Skin: Negative for rash.  Neurological: Negative for dizziness, tingling, focal weakness, seizures, weakness and headaches.  Endo/Heme/Allergies: Does not bruise/bleed easily.  Psychiatric/Behavioral: Negative for depression and suicidal ideas. The patient does not have insomnia.        No Known Allergies   Past Medical History:  Diagnosis Date  . Arthritis   . CAD (coronary artery disease)   . Hypertension   . Prostate cancer ([place])    metastatic   . RAD (reactive airway disease)      Past Surgical History:  Procedure Laterality Date  . CORONARY ANGIOPLASTY WITH STENT PLACEMENT    . CORONARY STENT INTERVENTION N/A 02/07/2018   Procedure: CORONARY STENT INTERVENTION;  Surgeon: Isaias Cowman, MD;  Location: [facility];  Service: Cardiovascular;  Laterality: N/A;  .  IR GENERIC HISTORICAL  10/17/2016     IR NEPHROSTOMY PLACEMENT RIGHT 10/17/2016 [facility]  . IR GENERIC HISTORICAL  10/17/2016   IR NEPHROSTOMY PLACEMENT LEFT 10/17/2016 [name], MD [facility]  . IR GENERIC HISTORICAL  12/23/2016   IR NEPHROSTOMY EXCHANGE LEFT 12/23/2016 [facility]  . IR GENERIC HISTORICAL  12/23/2016   IR NEPHROSTOMY EXCHANGE RIGHT 12/23/2016 [facility]  . IR NEPHROSTOMY EXCHANGE LEFT  02/11/2017  . IR NEPHROSTOMY EXCHANGE RIGHT  02/11/2017  . KNEE SURGERY Left 2006   Laparascopy done on left knee, scar tissue taken out  . LEFT HEART CATH AND CORONARY ANGIOGRAPHY N/A 02/07/2018   Procedure: LEFT HEART CATH AND CORONARY ANGIOGRAPHY;  Surgeon: Isaias Cowman, MD;  Location: [facility];  Service: Cardiovascular;  Laterality: N/A;  . nephrostomy tubes Bilateral     Social History   Socioeconomic History  . Marital status: Married    Spouse name: Not on file  . Number of children: Not on file  . Years of education: Not on file  . Highest education level: Not on file  Occupational History  . Not on file  Tobacco Use  . Smoking status: Former Smoker    Packs/day: 1.00    Years: 69.50    Pack years: 69.50    Quit date: 11/04/2014    Years since quitting: 5.0  . Smokeless tobacco: Never Used  Substance and Sexual Activity  . Alcohol use: No  . Drug use: No  . Sexual activity: Yes  Other Topics Concern  . Not on file  Social History Narrative  . Not on file   Social Determinants of Health   Financial Resource Strain:   . Difficulty of Paying Living Expenses: Not on file  Food Insecurity:   . Worried About Charity fundraiser in the Last Year: Not on file  . Ran Out of Food in the Last Year: Not on file  Transportation Needs:   . Lack of Transportation (Medical): Not on file  . Lack of Transportation (Non-Medical): Not on file  Physical Activity:   . Days of Exercise per Week: Not on file  . Minutes of Exercise per Session: Not on file  Stress:   .  Feeling of Stress : Not on file  Social Connections:   . Frequency of Communication with Friends and Family: Not on file  . Frequency of Social Gatherings with Friends and Family: Not on file  . Attends Religious Services: Not on file  . Active Member of Clubs or Organizations: Not on file  . Attends Archivist Meetings: Not on file  . Marital Status: Not on file  Intimate Partner Violence:   . Fear of Current or Ex-Partner: Not on file  . Emotionally Abused: Not on file  . Physically Abused: Not on file  . Sexually Abused: Not on file    Family History  Problem Relation Age of Onset  . Hypertension Other   . Lung cancer Brother      Current Outpatient Medications:  .  abiraterone acetate (ZYTIGA) 250 MG tablet, TAKE 4 TABLETS (1,000 MG TOTAL) BY MOUTH DAILY. TAKE ON AN EMPTY STOMACH 1 HOUR BEFORE OR 2 HOURS AFTER A MEAL, Disp: 120 tablet, Rfl: 2 .  aspirin EC 81 MG tablet, Take 81 mg by mouth daily. , Disp: , Rfl:  .  Cholecalciferol (VITAMIN D) 50 MCG (2000 UT) tablet, Take 2,000 Units by mouth daily., Disp: , Rfl:  .  clopidogrel (PLAVIX)  75 MG tablet, Take 75 mg by mouth daily., Disp: , Rfl: 11 .  cyanocobalamin 100 MCG tablet, Take 100 mcg by mouth daily., Disp: , Rfl:  .  fentaNYL (DURAGESIC) 25 MCG/HR, Place 1 patch onto the skin every 3 (three) days., Disp: 10 patch, Rfl: 0 .  ferrous sulfate 325 (65 FE) MG tablet, Take 325 mg by mouth daily with breakfast., Disp: , Rfl:  .  gabapentin (NEURONTIN) 100 MG capsule, Take 1 capsule (100 mg total) by mouth at bedtime., Disp: 30 capsule, Rfl: 0 .  loratadine (CLARITIN) 10 MG tablet, Take 10 mg by mouth daily. , Disp: , Rfl:  .  metoprolol (LOPRESSOR) 50 MG tablet, Take 50 mg by mouth 2 (two) times daily. , Disp: , Rfl:  .  mometasone (NASONEX) 50 MCG/ACT nasal spray, , Disp: , Rfl:  .  nitroGLYCERIN (NITROSTAT) 0.4 MG SL tablet, Place 0.4 mg under the tongue every 5 (five) minutes x 3 doses as needed for chest pain.,  Disp: , Rfl:  .  ondansetron (ZOFRAN ODT) 4 MG disintegrating tablet, Take 1 tablet (4 mg total) by mouth every 8 (eight) hours as needed for nausea or vomiting., Disp: 40 tablet, Rfl: 2 .  Oxycodone HCl 10 MG TABS, Take 2 tablets (20 mg total) by mouth every 6 (six) hours as needed., Disp: 240 tablet, Rfl: 0 .  pantoprazole (PROTONIX) 40 MG tablet, Take 1 tablet (40 mg total) by mouth 2 (two) times daily., Disp: 60 tablet, Rfl: 0 .  polyethylene glycol (MIRALAX / GLYCOLAX) 17 g packet, Take 17 g by mouth daily as needed for mild constipation., Disp: 14 each, Rfl: 0 .  predniSONE (DELTASONE) 5 MG tablet, TAKE 1 TABLET (5 MG TOTAL) BY MOUTH 2 TIMES DAILY WITH A MEAL., Disp: 60 tablet, Rfl: 5 .  sennosides-docusate sodium (SENOKOT-S) 8.6-50 MG tablet, Take 2 tablets by mouth 2 (two) times daily. , Disp: , Rfl:  .  tamsulosin (FLOMAX) 0.4 MG CAPS capsule, Take 1 capsule (0.4 mg total) by mouth daily., Disp: 30 capsule, Rfl: 11 .  Turmeric (QC TUMERIC COMPLEX) 500 MG CAPS, Take by mouth daily., Disp: , Rfl:  .  vitamin C (ASCORBIC ACID) 250 MG tablet, Take 250 mg by mouth daily., Disp: , Rfl:  .  calcium carbonate (OSCAL) 1500 (600 Ca) MG TABS tablet, Take by mouth daily with breakfast., Disp: , Rfl:  No current facility-administered medications for this visit.  Facility-Administered Medications Ordered in Other Visits:  .  0.9 %  sodium chloride infusion, , Intravenous, Continuous, Sindy Guadeloupe, MD, Stopped at 04/28/18 1452 .  Leuprolide Acetate (6 Month) (LUPRON) injection 45 mg, 45 mg, Intramuscular, Q6 months, Sindy Guadeloupe, MD .  Leuprolide Acetate (6 Month) (LUPRON) injection 45 mg, 45 mg, Intramuscular, Q6 months, Sindy Guadeloupe, MD, 45 mg at 08/07/19 1213  Physical exam:  Vitals:   11/09/19 1005 11/09/19 1008  BP:  (!) 173/93  Pulse:  94  Temp:  97.8 F (36.6 C)  TempSrc: Tympanic Tympanic  Weight: 200 lb (90.7 kg) 200 lb (90.7 kg)   Physical Exam HENT:     Head: Normocephalic  and atraumatic.  Eyes:     Pupils: Pupils are equal, round, and reactive to light.  Cardiovascular:     Rate and Rhythm: Normal rate and regular rhythm.     Heart sounds: Normal heart sounds.  Pulmonary:     Effort: Pulmonary effort is normal.     Breath sounds: Normal breath  sounds.  Abdominal:     General: Bowel sounds are normal.     Palpations: Abdomen is soft.  Musculoskeletal:     Cervical back: Normal range of motion.     Comments: B/l +1 edema  Skin:    General: Skin is warm and dry.  Neurological:     Mental Status: He is alert and oriented to person, place, and time.      CMP Latest Ref Rng & Units 11/09/2019  Glucose 70 - 99 mg/dL 170(H)  BUN 8 - 23 mg/dL 18  Creatinine 0.61 - 1.24 mg/dL 1.78(H)  Sodium 135 - 145 mmol/L 139  Potassium 3.5 - 5.1 mmol/L 4.0  Chloride 98 - 111 mmol/L 102  CO2 22 - 32 mmol/L 27  Calcium 8.9 - 10.3 mg/dL 8.9  Total Protein 6.5 - 8.1 g/dL 6.9  Total Bilirubin 0.3 - 1.2 mg/dL 0.5  Alkaline Phos 38 - 126 U/L 87  AST 15 - 41 U/L 16  ALT 0 - 44 U/L 11   CBC Latest Ref Rng & Units 11/09/2019  WBC 4.0 - 10.5 K/uL 6.4  Hemoglobin 13.0 - 17.0 g/dL 11.6(L)  Hematocrit 39.0 - 52.0 % 37.8(L)  Platelets 150 - 400 K/uL 355     Assessment and plan- Patient is a 85 y.o. male with castrate sensitive prostate cancer metastatic to the bone currently on Lupron and Zytiga here for routine follow-up  1.  Patient is doing well from his prostate cancer standpoint and he has been on Zytiga for over 30 months now and tolerating it well without any significant side effects.  His PSA continues to be low at 0.01.  I will discuss repeat scans at his next visit.  2. I will see him back in 3 months time with CBC with differential, CMP and PSA for his next dose of Lupron  3. Neoplasm related pain: Continue as needed oxycodone.  He is trying to come off fentanyl patches.  4. Anemia: Likely secondary to chronic disease as well as anemia of chronic kidney disease.   Stable continue to monitor   Visit Diagnosis 1. Prostate cancer metastatic to bone ([place])   2. High risk medication use   3. Neoplasm related pain   4. Anemia of chronic kidney failure, unspecified stage      Dr. Randa Evens, MD, MPH [facility] at [facility] [id number] 11/12/2019 11:22 AM

## 2019-11-22 MED FILL — predniSONE 5 MG TABS: 5 | 30 days supply | Qty: 60 | Fill #2

## 2019-11-22 MED FILL — ABIRATERONE ACETATE 250 MG: 250 | 30 days supply | Qty: 120 | Fill #2

## 2019-11-29 ENCOUNTER — Other Ambulatory Visit: Payer: Self-pay

## 2019-11-29 DIAGNOSIS — C7951 Secondary malignant neoplasm of bone: Secondary | ICD-10-CM

## 2019-11-29 DIAGNOSIS — C61 Malignant neoplasm of prostate: Secondary | ICD-10-CM

## 2019-11-29 MED ORDER — OXYCODONE HCL 10 MG PO TABS: 20.0000 mg | ORAL_TABLET | Freq: Four times a day (QID) | ORAL | 0 refills | Status: DC | PRN

## 2019-12-18 ENCOUNTER — Other Ambulatory Visit: Payer: Self-pay | Admitting: Oncology

## 2019-12-21 ENCOUNTER — Other Ambulatory Visit: Payer: Self-pay | Admitting: Internal Medicine

## 2019-12-21 ENCOUNTER — Other Ambulatory Visit (HOSPITAL_COMMUNITY): Payer: Self-pay | Admitting: Internal Medicine

## 2019-12-21 DIAGNOSIS — I82411 Acute embolism and thrombosis of right femoral vein: Secondary | ICD-10-CM

## 2019-12-24 ENCOUNTER — Ambulatory Visit: Admission: RE | Admit: 2019-12-24 | Payer: Medicare Other | Source: Ambulatory Visit

## 2019-12-24 MED FILL — ABIRATERONE ACETATE 250 MG: 250 | 30 days supply | Qty: 120 | Fill #0

## 2019-12-24 MED FILL — predniSONE 5 MG TABS: 5 | 30 days supply | Qty: 60 | Fill #3

## 2019-12-28 ENCOUNTER — Other Ambulatory Visit: Payer: Self-pay | Admitting: *Deleted

## 2019-12-28 DIAGNOSIS — C7951 Secondary malignant neoplasm of bone: Secondary | ICD-10-CM

## 2019-12-28 DIAGNOSIS — C61 Malignant neoplasm of prostate: Secondary | ICD-10-CM

## 2019-12-28 MED ORDER — FENTANYL 25 MCG/HR TD PT72: 1.0000 | MEDICATED_PATCH | TRANSDERMAL | 0 refills | Status: DC

## 2019-12-28 MED ORDER — OXYCODONE HCL 10 MG PO TABS: 20.0000 mg | ORAL_TABLET | Freq: Four times a day (QID) | ORAL | 0 refills | Status: DC | PRN

## 2019-12-31 ENCOUNTER — Other Ambulatory Visit: Payer: Self-pay

## 2019-12-31 ENCOUNTER — Ambulatory Visit
Admission: RE | Admit: 2019-12-31 | Discharge: 2019-12-31 | Disposition: A | Discharge status: Home / Self Care | Payer: Medicare Other | Source: Ambulatory Visit | Attending: Internal Medicine | Admitting: Internal Medicine

## 2019-12-31 DIAGNOSIS — I82411 Acute embolism and thrombosis of right femoral vein: Secondary | ICD-10-CM | POA: Diagnosis present

## 2020-01-14 ENCOUNTER — Other Ambulatory Visit: Payer: Self-pay | Admitting: *Deleted

## 2020-01-14 DIAGNOSIS — C61 Malignant neoplasm of prostate: Secondary | ICD-10-CM

## 2020-01-14 DIAGNOSIS — C7951 Secondary malignant neoplasm of bone: Secondary | ICD-10-CM

## 2020-01-14 MED ORDER — OXYCODONE HCL 10 MG PO TABS: 20.0000 mg | ORAL_TABLET | Freq: Four times a day (QID) | ORAL | 0 refills | Status: DC | PRN

## 2020-01-21 ENCOUNTER — Other Ambulatory Visit: Payer: Self-pay | Admitting: *Deleted

## 2020-01-21 ENCOUNTER — Telehealth: Payer: Self-pay | Admitting: *Deleted

## 2020-01-21 NOTE — Telephone Encounter (Signed)
I have been trying to call and get info about pt's long acting pain med. He was on duragesic and when he went to pick it up it was over 100 dollars. He could not afford it. I called [facility] and they said they did not know why it is more expensive. His short acting med is still the same cost it has always been. Called today to his [employer]. I was told that meds are part D and I had to call [facility] at [phone]. Then was told I have to talk to pharmacy section at [phone]. Then I was switched to [facility] and she said that the duragesic patch is now Tier 4 and it is more expensive. Then I was switched to [facility] the [employer]. He states that [employer] is covered with his insurance but no more than 30 day supply. I have called and left message with pt. To see if he would like to try this as long acting med because the patch was too expensive. Will wait to hear back

## 2020-01-24 MED FILL — ABIRATERONE ACETATE 250 MG: 250 | 30 days supply | Qty: 120 | Fill #1

## 2020-01-24 MED FILL — predniSONE 5 MG TABS: 5 | 30 days supply | Qty: 60 | Fill #4

## 2020-01-25 ENCOUNTER — Other Ambulatory Visit: Payer: Self-pay | Admitting: *Deleted

## 2020-01-25 ENCOUNTER — Telehealth: Payer: Self-pay | Admitting: *Deleted

## 2020-01-25 MED ORDER — XTAMPZA ER 27 MG PO C12A: 27.0000 mg | EXTENDED_RELEASE_CAPSULE | Freq: Two times a day (BID) | ORAL | 0 refills | Status: DC

## 2020-01-25 NOTE — Telephone Encounter (Signed)
I CALLED pt to ask him if he wanted to try long acting med that I have found through his insurance that will cover the med and it is lower than the cost of duragesic patch which has now cost 100 dollars. The xtampza will be 48 dollars.. he is agreeable and I have sent rx into [facility]

## 2020-02-06 ENCOUNTER — Encounter: Payer: Self-pay | Admitting: Oncology

## 2020-02-06 NOTE — Progress Notes (Signed)
Patient is coming in for follow up he is doing well no complaints or concerns  

## 2020-02-07 ENCOUNTER — Other Ambulatory Visit: Payer: Self-pay

## 2020-02-07 ENCOUNTER — Inpatient Hospital Stay: Payer: Medicare Other

## 2020-02-07 ENCOUNTER — Inpatient Hospital Stay (HOSPITAL_BASED_OUTPATIENT_CLINIC_OR_DEPARTMENT_OTHER): Payer: Medicare Other | Admitting: Oncology

## 2020-02-07 ENCOUNTER — Encounter: Payer: Self-pay | Admitting: Oncology

## 2020-02-07 ENCOUNTER — Inpatient Hospital Stay: Payer: Medicare Other | Attending: Oncology

## 2020-02-07 VITALS — BP 158/102 | HR 90 | Resp 18 | Wt 206.2 lb

## 2020-02-07 DIAGNOSIS — C7951 Secondary malignant neoplasm of bone: Secondary | ICD-10-CM | POA: Insufficient documentation

## 2020-02-07 DIAGNOSIS — N189 Chronic kidney disease, unspecified: Secondary | ICD-10-CM

## 2020-02-07 DIAGNOSIS — C61 Malignant neoplasm of prostate: Secondary | ICD-10-CM | POA: Diagnosis present

## 2020-02-07 DIAGNOSIS — Z5111 Encounter for antineoplastic chemotherapy: Secondary | ICD-10-CM | POA: Diagnosis present

## 2020-02-07 DIAGNOSIS — G893 Neoplasm related pain (acute) (chronic): Secondary | ICD-10-CM | POA: Diagnosis not present

## 2020-02-07 DIAGNOSIS — Z79899 Other long term (current) drug therapy: Secondary | ICD-10-CM

## 2020-02-07 DIAGNOSIS — D631 Anemia in chronic kidney disease: Secondary | ICD-10-CM

## 2020-02-07 LAB — CBC WITH DIFFERENTIAL/PLATELET
Abs Immature Granulocytes: 0.05 10*3/uL (ref 0.00–0.07)
Basophils Absolute: 0 10*3/uL (ref 0.0–0.1)
Basophils Relative: 0 %
Eosinophils Absolute: 0.1 10*3/uL (ref 0.0–0.5)
Eosinophils Relative: 1 %
HCT: 35.4 % — ABNORMAL LOW (ref 39.0–52.0)
Hemoglobin: 11.2 g/dL — ABNORMAL LOW (ref 13.0–17.0)
Immature Granulocytes: 1 %
Lymphocytes Relative: 6 %
Lymphs Abs: 0.5 10*3/uL — ABNORMAL LOW (ref 0.7–4.0)
MCH: 29.6 pg (ref 26.0–34.0)
MCHC: 31.6 g/dL (ref 30.0–36.0)
MCV: 93.4 fL (ref 80.0–100.0)
Monocytes Absolute: 0.5 10*3/uL (ref 0.1–1.0)
Monocytes Relative: 6 %
Neutro Abs: 7.1 10*3/uL (ref 1.7–7.7)
Neutrophils Relative %: 86 %
Platelets: 296 10*3/uL (ref 150–400)
RBC: 3.79 MIL/uL — ABNORMAL LOW (ref 4.22–5.81)
RDW: 15.1 % (ref 11.5–15.5)
WBC: 8.3 10*3/uL (ref 4.0–10.5)
nRBC: 0 % (ref 0.0–0.2)

## 2020-02-07 LAB — COMPREHENSIVE METABOLIC PANEL
ALT: 9 U/L (ref 0–44)
AST: 12 U/L — ABNORMAL LOW (ref 15–41)
Albumin: 3.1 g/dL — ABNORMAL LOW (ref 3.5–5.0)
Alkaline Phosphatase: 83 U/L (ref 38–126)
Anion gap: 10 (ref 5–15)
BUN: 21 mg/dL (ref 8–23)
CO2: 29 mmol/L (ref 22–32)
Calcium: 8.7 mg/dL — ABNORMAL LOW (ref 8.9–10.3)
Chloride: 100 mmol/L (ref 98–111)
Creatinine, Ser: 1.61 mg/dL — ABNORMAL HIGH (ref 0.61–1.24)
GFR calc Af Amer: 45 mL/min — ABNORMAL LOW (ref >60)
GFR calc non Af Amer: 39 mL/min — ABNORMAL LOW (ref >60)
Glucose, Bld: 155 mg/dL — ABNORMAL HIGH (ref 70–99)
Potassium: 4.1 mmol/L (ref 3.5–5.1)
Sodium: 139 mmol/L (ref 135–145)
Total Bilirubin: 0.4 mg/dL (ref 0.3–1.2)
Total Protein: 6.6 g/dL (ref 6.5–8.1)

## 2020-02-07 LAB — PSA: Prostatic Specific Antigen: 0.02 ng/mL (ref 0.00–4.00)

## 2020-02-07 MED ORDER — LEUPROLIDE ACETATE (6 MONTH) 45 MG ~~LOC~~ KIT
45.0000 mg | PACK | Freq: Once | SUBCUTANEOUS | Status: AC
  Administered 2020-02-07: 10:00:00 45 mg via SUBCUTANEOUS
  Filled 2020-02-07: qty 45

## 2020-02-08 NOTE — Progress Notes (Signed)
Hematology/Oncology Consult note [facility]  Telephone:([phone] [phone]  Patient Care Team: [name], MD as PCP - General (Internal Medicine)   Name of the patient: Johnathan Gould  [id number]  09-01-1935   Date of visit: 02/08/20  Diagnosis- 1. 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 Anemia of chronic kidney disease   Chief complaint/ Reason for visit-routine follow-up of prostate cancer on Zytiga  Heme/Onc history: patient is 85 year old male was recently admitted to the hospital on 10/16/2016 with symptoms of left hip pain and renal failure. He was found to have bilateral hydronephrosis and is status post bilateral nephrostomy tube placement. CT abdomen showed an irregular polypoid mass at the posterior aspect of the bladder highly concerning for bladder carcinoma. Extensive bulky adenopathy throughout the abdomen and pelvis consistent with nodal metastases. Widespread osseous metastatic disease. Bone scan showed multifocal areas of increased activity throughout the pelvis, bilateral femurs, rib cage, right scapula, thoracic cervical and lumbar spine consistent with metastatic disease. Patient was found to have enlarged abnormal prostate as well as an elevated PSA of 469 and was presumptively diagnosed with metastatic prostate cancer. He was started on Firmagon by urology. He was also seen by radiation oncology and started palliative radiation to his left hip on 11/04/2016 and finished 10 doses of palliative Rt.  He is currently on lupron q6 months  4 core biopsy obtained for tissue diagnosis which showed: prostate adenocarcinoma gleasons 9 and 10 in all 4 cores  zytiga started in March 2018. He is on fosamax for his osteoporosiswhich was stopped due to heartburn  Patient was admitted to the hospital on 02/05/18 with worsening epigastric pain. He was subsequently found to have acute MITwo-vessel coronary artery disease,  with 80% stenosis proximal LAD, occluded mid left circumflex, subtotal occlusion OM1, and patent stent mid RCAand underwent DES placement  Interval history-he continues to have problems with bilateral lower extremity swelling and his right leg is way more swollen than the left which has always been the case.  He also underwent ultrasound of the right lower extremity which did not show any evidence of DVT last month.  He currently has a compression wrap in place.  Also has chronic low back pain for which he uses as needed oxycodone.  We had switched him from fentanyl patch to [facility] ER but both of them were prohibitively expensive for the patient to take.  As of now he is only taking as needed oxycodone  ECOG PS- 2 Pain scale- 4 Opioid associated constipation- no  Review of systems- Review of Systems  Constitutional: Positive for malaise/fatigue. Negative for chills, fever and weight loss.  HENT: Negative for congestion, ear discharge and nosebleeds.   Eyes: Negative for blurred vision.  Respiratory: Negative for cough, hemoptysis, sputum production, shortness of breath and wheezing.   Cardiovascular: Positive for leg swelling. Negative for chest pain, palpitations, orthopnea and claudication.  Gastrointestinal: Negative for abdominal pain, blood in stool, constipation, diarrhea, heartburn, melena, nausea and vomiting.  Genitourinary: Negative for dysuria, flank pain, frequency, hematuria and urgency.  Musculoskeletal: Positive for back pain. Negative for joint pain and myalgias.  Skin: Negative for rash.  Neurological: Negative for dizziness, tingling, focal weakness, seizures, weakness and headaches.  Endo/Heme/Allergies: Does not bruise/bleed easily.  Psychiatric/Behavioral: Negative for depression and suicidal ideas. The patient does not have insomnia.       No Known Allergies   Past Medical History:  Diagnosis Date  . Arthritis   .  CAD (coronary artery disease)   . Hypertension     . Prostate cancer ([place])    metastatic   . RAD (reactive airway disease)      Past Surgical History:  Procedure Laterality Date  . CORONARY ANGIOPLASTY WITH STENT PLACEMENT    . CORONARY STENT INTERVENTION N/A 02/07/2018   Procedure: CORONARY STENT INTERVENTION;  Surgeon: Isaias Cowman, MD;  Location: [place] [facility];  Service: Cardiovascular;  Laterality: N/A;  . IR GENERIC HISTORICAL  10/17/2016   IR NEPHROSTOMY PLACEMENT RIGHT 10/17/2016 [facility]INTER[facility] RAD  . IR GENERIC HISTORICAL  10/17/2016   IR NEPHROSTOMY PLACEMENT LEFT 10/17/2016 [name], MD [facility]INTER[facility] RAD  . IR GENERIC HISTORICAL  12/23/2016   IR NEPHROSTOMY EXCHANGE LEFT 12/23/2016 [facility]INTER[facility]  . IR GENERIC HISTORICAL  12/23/2016   IR NEPHROSTOMY EXCHANGE RIGHT 12/23/2016 [facility]INTER[facility] RAD  . IR NEPHROSTOMY EXCHANGE LEFT  02/11/2017  . IR NEPHROSTOMY EXCHANGE RIGHT  02/11/2017  . KNEE SURGERY Left 2006   Laparascopy done on left knee, scar tissue taken out  . LEFT HEART CATH AND CORONARY ANGIOGRAPHY N/A 02/07/2018   Procedure: LEFT HEART CATH AND CORONARY ANGIOGRAPHY;  Surgeon: Isaias Cowman, MD;  Location: [place] [facility];  Service: Cardiovascular;  Laterality: N/A;  . nephrostomy tubes Bilateral     Social History   Socioeconomic History  . Marital status: Married    Spouse name: Not on file  . Number of children: Not on file  . Years of education: Not on file  . Highest education level: Not on file  Occupational History  . Not on file  Tobacco Use  . Smoking status: Former Smoker    Packs/day: 1.00    Years: 69.50    Pack years: 69.50    Quit date: 11/04/2014    Years since quitting: 5.2  . Smokeless tobacco: Never Used  Substance and Sexual Activity  . Alcohol use: No  . Drug use: No  . Sexual activity: Yes  Other Topics Concern  . Not on file  Social History Narrative  . Not on file   Social Determinants of Health   Financial Resource Strain:   . Difficulty of Paying Living  Expenses:   Food Insecurity:   . Worried About Charity fundraiser in the Last Year:   . Arboriculturist in the Last Year:   Transportation Needs:   . Film/video editor (Medical):   Marland Kitchen Lack of Transportation (Non-Medical):   Physical Activity:   . Days of Exercise per Week:   . Minutes of Exercise per Session:   Stress:   . Feeling of Stress :   Social Connections:   . Frequency of Communication with Friends and Family:   . Frequency of Social Gatherings with Friends and Family:   . Attends Religious Services:   . Active Member of Clubs or Organizations:   . Attends Archivist Meetings:   Marland Kitchen Marital Status:   Intimate Partner Violence:   . Fear of Current or Ex-Partner:   . Emotionally Abused:   Marland Kitchen Physically Abused:   . Sexually Abused:     Family History  Problem Relation Age of Onset  . Hypertension Other   . Lung cancer Brother      Current Outpatient Medications:  .  abiraterone acetate (ZYTIGA) 250 MG tablet, TAKE 4 TABLETS (1,000 MG TOTAL) BY MOUTH DAILY. TAKE ON AN EMPTY STOMACH 1 HOUR BEFORE OR 2 HOURS AFTER A MEAL, Disp: 120 tablet, Rfl: 2 .  aspirin EC 81 MG tablet, Take 81 mg by mouth daily. , Disp: , Rfl:  .  calcium carbonate (OSCAL) 1500 (600 Ca) MG TABS tablet, Take by mouth daily with breakfast., Disp: , Rfl:  .  Cholecalciferol (VITAMIN D) 50 MCG (2000 UT) tablet, Take 2,000 Units by mouth daily., Disp: , Rfl:  .  cyanocobalamin 100 MCG tablet, Take 100 mcg by mouth daily., Disp: , Rfl:  .  fentaNYL (DURAGESIC) 25 MCG/HR, Place 1 patch onto the skin every 3 (three) days., Disp: 10 patch, Rfl: 0 .  ferrous sulfate 325 (65 FE) MG tablet, Take 325 mg by mouth daily with breakfast., Disp: , Rfl:  .  gabapentin (NEURONTIN) 100 MG capsule, Take 1 capsule (100 mg total) by mouth at bedtime., Disp: 30 capsule, Rfl: 0 .  loratadine (CLARITIN) 10 MG tablet, Take 10 mg by mouth daily. , Disp: , Rfl:  .  metoprolol (LOPRESSOR) 50 MG tablet, Take 50 mg by  mouth 2 (two) times daily. , Disp: , Rfl:  .  mometasone (NASONEX) 50 MCG/ACT nasal spray, , Disp: , Rfl:  .  nitroGLYCERIN (NITROSTAT) 0.4 MG SL tablet, Place 0.4 mg under the tongue every 5 (five) minutes x 3 doses as needed for chest pain., Disp: , Rfl:  .  ondansetron (ZOFRAN ODT) 4 MG disintegrating tablet, Take 1 tablet (4 mg total) by mouth every 8 (eight) hours as needed for nausea or vomiting., Disp: 40 tablet, Rfl: 2 .  oxyCODONE ER (XTAMPZA ER) 27 MG C12A, Take 27 mg by mouth every 12 (twelve) hours., Disp: 60 capsule, Rfl: 0 .  Oxycodone HCl 10 MG TABS, Take 2 tablets (20 mg total) by mouth every 6 (six) hours as needed., Disp: 240 tablet, Rfl: 0 .  pantoprazole (PROTONIX) 40 MG tablet, Take 1 tablet (40 mg total) by mouth 2 (two) times daily., Disp: 60 tablet, Rfl: 0 .  polyethylene glycol (MIRALAX / GLYCOLAX) 17 g packet, Take 17 g by mouth daily as needed for mild constipation., Disp: 14 each, Rfl: 0 .  predniSONE (DELTASONE) 5 MG tablet, TAKE 1 TABLET (5 MG TOTAL) BY MOUTH 2 TIMES DAILY WITH A MEAL., Disp: 60 tablet, Rfl: 5 .  sennosides-docusate sodium (SENOKOT-S) 8.6-50 MG tablet, Take 2 tablets by mouth 2 (two) times daily. , Disp: , Rfl:  .  tamsulosin (FLOMAX) 0.4 MG CAPS capsule, Take 1 capsule (0.4 mg total) by mouth daily., Disp: 30 capsule, Rfl: 11 .  Turmeric (QC TUMERIC COMPLEX) 500 MG CAPS, Take by mouth daily., Disp: , Rfl:  .  vitamin C (ASCORBIC ACID) 250 MG tablet, Take 250 mg by mouth daily., Disp: , Rfl:  .  clopidogrel (PLAVIX) 75 MG tablet, Take 75 mg by mouth daily., Disp: , Rfl: 11 No current facility-administered medications for this visit.  Facility-Administered Medications Ordered in Other Visits:  .  0.9 %  sodium chloride infusion, , Intravenous, Continuous, Sindy Guadeloupe, MD, Stopped at 04/28/18 1452 .  Leuprolide Acetate (6 Month) (LUPRON) injection 45 mg, 45 mg, Intramuscular, Q6 months, Sindy Guadeloupe, MD .  Leuprolide Acetate (6 Month) (LUPRON)  injection 45 mg, 45 mg, Intramuscular, Q6 months, Sindy Guadeloupe, MD, 45 mg at 08/07/19 1213  Physical exam:  Vitals:   02/07/20 0941  BP: (!) 158/102  Pulse: 90  Resp: 18  SpO2: 96%  Weight: 206 lb 3.2 oz (93.5 kg)   Physical Exam HENT:     Head: Normocephalic and atraumatic.  Eyes:  Pupils: Pupils are equal, round, and reactive to light.  Cardiovascular:     Rate and Rhythm: Normal rate and regular rhythm.     Heart sounds: Normal heart sounds.  Pulmonary:     Effort: Pulmonary effort is normal.     Breath sounds: Normal breath sounds.  Abdominal:     General: Bowel sounds are normal.     Palpations: Abdomen is soft.  Musculoskeletal:     Cervical back: Normal range of motion.     Comments: Bilateral lower extremity swelling right greater than left  Skin:    General: Skin is warm and dry.  Neurological:     Mental Status: He is alert and oriented to person, place, and time.      CMP Latest Ref Rng & Units 02/07/2020  Glucose 70 - 99 mg/dL 155(H)  BUN 8 - 23 mg/dL 21  Creatinine 0.61 - 1.24 mg/dL 1.61(H)  Sodium 135 - 145 mmol/L 139  Potassium 3.5 - 5.1 mmol/L 4.1  Chloride 98 - 111 mmol/L 100  CO2 22 - 32 mmol/L 29  Calcium 8.9 - 10.3 mg/dL 8.7(L)  Total Protein 6.5 - 8.1 g/dL 6.6  Total Bilirubin 0.3 - 1.2 mg/dL 0.4  Alkaline Phos 38 - 126 U/L 83  AST 15 - 41 U/L 12(L)  ALT 0 - 44 U/L 9   CBC Latest Ref Rng & Units 02/07/2020  WBC 4.0 - 10.5 K/uL 8.3  Hemoglobin 13.0 - 17.0 g/dL 11.2(L)  Hematocrit 39.0 - 52.0 % 35.4(L)  Platelets 150 - 400 K/uL 296     Assessment and plan- Patient is a 85 y.o. male  with castrate sensitive prostate cancer metastatic to the bone currently on Lupron and Zytiga.  He is here for routine follow-up of following issues:  1.  Castrate sensitive metastatic prostate cancer with bone metastases: Currently stable on Lupron and Zytiga.  PSA today is 0.02 as compared to 0.01 3 months ago.  Continue to monitor he will continue to take  Zytiga and prednisone.  I will plan to get repeat scans if there is a consistent rise in his PSA.  I will see him in 3 months with CBC with differential CMP and PSA and he will get Lupron today.  2.  Right lower extremity swelling: Etiology unclear.  DVT has been ruled out in the past.  Also CT scan did not show any evidence of adenopathy causing compression.  He continues to see Dr. Rebecka Apley for this  3.  Neoplasm related pain: Continue as needed oxycodone   Visit Diagnosis 1. Prostate cancer metastatic to bone ([place])   2. High risk medication use      Dr. Randa Evens, MD, MPH [facility] [id number] 02/08/2020 12:47 PM

## 2020-02-14 ENCOUNTER — Other Ambulatory Visit: Payer: Self-pay | Admitting: *Deleted

## 2020-02-14 ENCOUNTER — Telehealth: Payer: Self-pay | Admitting: *Deleted

## 2020-02-14 DIAGNOSIS — C61 Malignant neoplasm of prostate: Secondary | ICD-10-CM

## 2020-02-14 NOTE — Telephone Encounter (Signed)
Pt request to have refill for oxycodone. Med sent to dr [name] for refill

## 2020-02-15 ENCOUNTER — Telehealth: Payer: Self-pay | Admitting: *Deleted

## 2020-02-15 MED ORDER — OXYCODONE HCL 5 MG PO TABS: 5.0000 mg | ORAL_TABLET | Freq: Four times a day (QID) | ORAL | 0 refills | Status: DC | PRN

## 2020-02-15 MED ORDER — OXYCODONE HCL 10 MG PO TABS: 20.0000 mg | ORAL_TABLET | Freq: Four times a day (QID) | ORAL | 0 refills | Status: DC | PRN

## 2020-02-15 NOTE — Telephone Encounter (Signed)
Pt was on 20 mg oxycodone and it is not managing his pain. His long acting meds is now too expensive with insurance so all he can do is breakthrough pain med. Dr [name] has increases the pain med to 25 mg. Pt aware of this.

## 2020-02-20 MED FILL — ABIRATERONE ACETATE 250 MG: 250 | 30 days supply | Qty: 120 | Fill #2

## 2020-02-20 MED FILL — predniSONE 5 MG TABS: 5 | 30 days supply | Qty: 60 | Fill #5

## 2020-03-14 ENCOUNTER — Other Ambulatory Visit: Payer: Self-pay | Admitting: *Deleted

## 2020-03-14 DIAGNOSIS — C61 Malignant neoplasm of prostate: Secondary | ICD-10-CM

## 2020-03-14 MED ORDER — OXYCODONE HCL 5 MG PO TABS: 5.0000 mg | ORAL_TABLET | Freq: Four times a day (QID) | ORAL | 0 refills | Status: DC | PRN

## 2020-03-14 MED ORDER — OXYCODONE HCL 10 MG PO TABS: 20.0000 mg | ORAL_TABLET | Freq: Four times a day (QID) | ORAL | 0 refills | Status: DC | PRN

## 2020-03-17 ENCOUNTER — Other Ambulatory Visit: Payer: Self-pay | Admitting: Oncology

## 2020-03-17 DIAGNOSIS — C7951 Secondary malignant neoplasm of bone: Secondary | ICD-10-CM

## 2020-03-20 MED FILL — ABIRATERONE ACETATE 250 MG: 250 | 30 days supply | Qty: 120 | Fill #0

## 2020-03-20 MED FILL — predniSONE 5 MG TABS: 5 | 30 days supply | Qty: 60 | Fill #0

## 2020-04-14 ENCOUNTER — Other Ambulatory Visit: Payer: Self-pay

## 2020-04-14 ENCOUNTER — Telehealth: Payer: Self-pay

## 2020-04-14 DIAGNOSIS — C7951 Secondary malignant neoplasm of bone: Secondary | ICD-10-CM

## 2020-04-14 MED ORDER — OXYCODONE HCL 10 MG PO TABS: 20.0000 mg | ORAL_TABLET | Freq: Four times a day (QID) | ORAL | 0 refills | Status: AC | PRN

## 2020-04-14 MED ORDER — OXYCODONE HCL 5 MG PO TABS: 5.0000 mg | ORAL_TABLET | Freq: Four times a day (QID) | ORAL | 0 refills | Status: AC | PRN

## 2020-04-14 MED FILL — ABIRATERONE ACETATE 250 MG: 250 | 30 days supply | Qty: 120 | Fill #1

## 2020-04-14 MED FILL — predniSONE 5 MG TABS: 5 | 30 days supply | Qty: 60 | Fill #1

## 2020-05-15 ENCOUNTER — Ambulatory Visit: Payer: Medicare Other | Admitting: Oncology

## 2020-05-15 ENCOUNTER — Other Ambulatory Visit: Payer: Medicare Other

## 2020-06-01 DEATH — deceased

## 2022-08-18 NOTE — Telephone Encounter (Signed)
Signing encounter, see previous note 04/14/20
